# Patient Record
Sex: Female | Born: 1945
Health system: Southern US, Community
[De-identification: ages and names within clinical notes are randomized; demographics above are authoritative.]

## PROBLEM LIST (undated history)

## (undated) ENCOUNTER — Emergency Department (HOSPITAL_BASED_OUTPATIENT_CLINIC_OR_DEPARTMENT_OTHER): Admission: EM | Payer: Medicare Other | Source: Home / Self Care

## (undated) DIAGNOSIS — K589 Irritable bowel syndrome without diarrhea: Secondary | ICD-10-CM

## (undated) DIAGNOSIS — J309 Allergic rhinitis, unspecified: Secondary | ICD-10-CM

## (undated) DIAGNOSIS — K219 Gastro-esophageal reflux disease without esophagitis: Secondary | ICD-10-CM

## (undated) DIAGNOSIS — E785 Hyperlipidemia, unspecified: Secondary | ICD-10-CM

## (undated) DIAGNOSIS — F32A Depression, unspecified: Secondary | ICD-10-CM

## (undated) DIAGNOSIS — D126 Benign neoplasm of colon, unspecified: Secondary | ICD-10-CM

## (undated) DIAGNOSIS — H269 Unspecified cataract: Secondary | ICD-10-CM

## (undated) DIAGNOSIS — I10 Essential (primary) hypertension: Secondary | ICD-10-CM

## (undated) DIAGNOSIS — N289 Disorder of kidney and ureter, unspecified: Secondary | ICD-10-CM

## (undated) DIAGNOSIS — K760 Fatty (change of) liver, not elsewhere classified: Secondary | ICD-10-CM

## (undated) DIAGNOSIS — I8390 Asymptomatic varicose veins of unspecified lower extremity: Secondary | ICD-10-CM

## (undated) DIAGNOSIS — M1990 Unspecified osteoarthritis, unspecified site: Secondary | ICD-10-CM

## (undated) DIAGNOSIS — F419 Anxiety disorder, unspecified: Secondary | ICD-10-CM

## (undated) DIAGNOSIS — R51 Headache: Secondary | ICD-10-CM

## (undated) DIAGNOSIS — K648 Other hemorrhoids: Secondary | ICD-10-CM

## (undated) DIAGNOSIS — R042 Hemoptysis: Secondary | ICD-10-CM

## (undated) DIAGNOSIS — T7840XA Allergy, unspecified, initial encounter: Secondary | ICD-10-CM

## (undated) DIAGNOSIS — E559 Vitamin D deficiency, unspecified: Secondary | ICD-10-CM

## (undated) HISTORY — DX: Unspecified osteoarthritis, unspecified site: M19.90

## (undated) HISTORY — DX: Allergy, unspecified, initial encounter: T78.40XA

## (undated) HISTORY — PX: BREAST CYST ASPIRATION: SHX578

## (undated) HISTORY — DX: Hemoptysis: R04.2

## (undated) HISTORY — DX: Irritable bowel syndrome without diarrhea: K58.9

## (undated) HISTORY — DX: Essential (primary) hypertension: I10

## (undated) HISTORY — PX: ROTATOR CUFF REPAIR: SHX139

## (undated) HISTORY — DX: Gastro-esophageal reflux disease without esophagitis: K21.9

## (undated) HISTORY — DX: Allergic rhinitis, unspecified: J30.9

## (undated) HISTORY — PX: BREAST SURGERY: SHX581

## (undated) HISTORY — DX: Vitamin D deficiency, unspecified: E55.9

## (undated) HISTORY — PX: OTHER SURGICAL HISTORY: SHX169

## (undated) HISTORY — PX: COLONOSCOPY: SHX174

## (undated) HISTORY — DX: Fatty (change of) liver, not elsewhere classified: K76.0

## (undated) HISTORY — DX: Benign neoplasm of colon, unspecified: D12.6

## (undated) HISTORY — DX: Depression, unspecified: F32.A

## (undated) HISTORY — DX: Disorder of kidney and ureter, unspecified: N28.9

## (undated) HISTORY — DX: Headache: R51

## (undated) HISTORY — DX: Anxiety disorder, unspecified: F41.9

## (undated) HISTORY — DX: Unspecified cataract: H26.9

## (undated) HISTORY — DX: Asymptomatic varicose veins of unspecified lower extremity: I83.90

## (undated) HISTORY — DX: Hyperlipidemia, unspecified: E78.5

## (undated) HISTORY — DX: Other hemorrhoids: K64.8

---

## 1998-12-23 ENCOUNTER — Ambulatory Visit (HOSPITAL_COMMUNITY): Admission: RE | Admit: 1998-12-23 | Discharge: 1998-12-23 | Payer: Self-pay | Admitting: Surgery

## 1998-12-23 ENCOUNTER — Encounter: Payer: Self-pay | Admitting: Surgery

## 1999-12-25 ENCOUNTER — Encounter: Payer: Self-pay | Admitting: Surgery

## 1999-12-25 ENCOUNTER — Ambulatory Visit (HOSPITAL_COMMUNITY): Admission: RE | Admit: 1999-12-25 | Discharge: 1999-12-25 | Payer: Self-pay | Admitting: Surgery

## 2000-11-01 ENCOUNTER — Other Ambulatory Visit: Admission: RE | Admit: 2000-11-01 | Discharge: 2000-11-01 | Payer: Self-pay | Admitting: *Deleted

## 2000-11-24 ENCOUNTER — Encounter: Admission: RE | Admit: 2000-11-24 | Discharge: 2000-11-24 | Payer: Self-pay | Admitting: *Deleted

## 2000-11-24 ENCOUNTER — Ambulatory Visit (HOSPITAL_COMMUNITY): Admission: RE | Admit: 2000-11-24 | Discharge: 2000-11-24 | Payer: Self-pay | Admitting: *Deleted

## 2000-11-24 ENCOUNTER — Encounter: Payer: Self-pay | Admitting: *Deleted

## 2002-04-13 ENCOUNTER — Encounter: Payer: Self-pay | Admitting: Internal Medicine

## 2002-04-13 ENCOUNTER — Ambulatory Visit (HOSPITAL_COMMUNITY): Admission: RE | Admit: 2002-04-13 | Discharge: 2002-04-13 | Payer: Self-pay | Admitting: Internal Medicine

## 2003-05-08 ENCOUNTER — Encounter: Payer: Self-pay | Admitting: Internal Medicine

## 2003-05-08 ENCOUNTER — Ambulatory Visit (HOSPITAL_COMMUNITY): Admission: RE | Admit: 2003-05-08 | Discharge: 2003-05-08 | Payer: Self-pay | Admitting: Internal Medicine

## 2004-05-28 ENCOUNTER — Ambulatory Visit (HOSPITAL_COMMUNITY): Admission: RE | Admit: 2004-05-28 | Discharge: 2004-05-28 | Payer: Self-pay | Admitting: Internal Medicine

## 2005-06-24 ENCOUNTER — Emergency Department (HOSPITAL_COMMUNITY): Admission: EM | Admit: 2005-06-24 | Discharge: 2005-06-24 | Payer: Self-pay | Admitting: Emergency Medicine

## 2005-07-13 ENCOUNTER — Ambulatory Visit (HOSPITAL_COMMUNITY): Admission: RE | Admit: 2005-07-13 | Discharge: 2005-07-13 | Payer: Self-pay | Admitting: Internal Medicine

## 2006-07-14 ENCOUNTER — Ambulatory Visit (HOSPITAL_COMMUNITY): Admission: RE | Admit: 2006-07-14 | Discharge: 2006-07-14 | Payer: Self-pay | Admitting: Internal Medicine

## 2007-04-24 ENCOUNTER — Ambulatory Visit: Payer: Self-pay

## 2007-06-26 ENCOUNTER — Ambulatory Visit (HOSPITAL_COMMUNITY): Admission: RE | Admit: 2007-06-26 | Discharge: 2007-06-26 | Payer: Self-pay | Admitting: Internal Medicine

## 2007-07-18 ENCOUNTER — Ambulatory Visit (HOSPITAL_COMMUNITY): Admission: RE | Admit: 2007-07-18 | Discharge: 2007-07-18 | Payer: Self-pay | Admitting: Internal Medicine

## 2007-11-08 ENCOUNTER — Ambulatory Visit (HOSPITAL_COMMUNITY): Admission: RE | Admit: 2007-11-08 | Discharge: 2007-11-08 | Payer: Self-pay | Admitting: Internal Medicine

## 2008-02-07 ENCOUNTER — Ambulatory Visit: Payer: Self-pay | Admitting: Vascular Surgery

## 2008-04-12 ENCOUNTER — Ambulatory Visit: Payer: Self-pay | Admitting: Vascular Surgery

## 2008-05-22 ENCOUNTER — Ambulatory Visit: Payer: Self-pay | Admitting: Vascular Surgery

## 2008-05-29 ENCOUNTER — Ambulatory Visit: Payer: Self-pay | Admitting: Vascular Surgery

## 2008-07-12 ENCOUNTER — Ambulatory Visit: Payer: Self-pay | Admitting: Vascular Surgery

## 2008-07-18 ENCOUNTER — Ambulatory Visit (HOSPITAL_COMMUNITY): Admission: RE | Admit: 2008-07-18 | Discharge: 2008-07-18 | Payer: Self-pay | Admitting: Internal Medicine

## 2008-09-06 LAB — CONVERTED CEMR LAB: Pap Smear: NORMAL

## 2008-10-23 ENCOUNTER — Encounter (INDEPENDENT_AMBULATORY_CARE_PROVIDER_SITE_OTHER): Payer: Self-pay | Admitting: *Deleted

## 2009-07-21 ENCOUNTER — Ambulatory Visit (HOSPITAL_COMMUNITY): Admission: RE | Admit: 2009-07-21 | Discharge: 2009-07-21 | Payer: Self-pay | Admitting: Obstetrics & Gynecology

## 2009-09-17 ENCOUNTER — Telehealth: Payer: Self-pay | Admitting: Internal Medicine

## 2009-09-18 ENCOUNTER — Encounter (INDEPENDENT_AMBULATORY_CARE_PROVIDER_SITE_OTHER): Payer: Self-pay | Admitting: *Deleted

## 2009-09-25 ENCOUNTER — Ambulatory Visit: Payer: Self-pay | Admitting: Internal Medicine

## 2009-09-25 ENCOUNTER — Encounter (INDEPENDENT_AMBULATORY_CARE_PROVIDER_SITE_OTHER): Payer: Self-pay | Admitting: *Deleted

## 2009-10-07 LAB — HM COLONOSCOPY

## 2009-10-14 ENCOUNTER — Ambulatory Visit: Payer: Self-pay | Admitting: Internal Medicine

## 2010-06-30 ENCOUNTER — Ambulatory Visit: Payer: Self-pay | Admitting: Internal Medicine

## 2010-06-30 DIAGNOSIS — I10 Essential (primary) hypertension: Secondary | ICD-10-CM

## 2010-06-30 DIAGNOSIS — E559 Vitamin D deficiency, unspecified: Secondary | ICD-10-CM

## 2010-06-30 DIAGNOSIS — E785 Hyperlipidemia, unspecified: Secondary | ICD-10-CM

## 2010-06-30 DIAGNOSIS — K219 Gastro-esophageal reflux disease without esophagitis: Secondary | ICD-10-CM

## 2010-06-30 DIAGNOSIS — J309 Allergic rhinitis, unspecified: Secondary | ICD-10-CM

## 2010-06-30 DIAGNOSIS — K589 Irritable bowel syndrome without diarrhea: Secondary | ICD-10-CM

## 2010-06-30 DIAGNOSIS — R51 Headache: Secondary | ICD-10-CM

## 2010-06-30 HISTORY — DX: Vitamin D deficiency, unspecified: E55.9

## 2010-06-30 HISTORY — DX: Hyperlipidemia, unspecified: E78.5

## 2010-06-30 HISTORY — DX: Headache: R51

## 2010-06-30 HISTORY — DX: Gastro-esophageal reflux disease without esophagitis: K21.9

## 2010-06-30 HISTORY — DX: Irritable bowel syndrome, unspecified: K58.9

## 2010-06-30 HISTORY — DX: Allergic rhinitis, unspecified: J30.9

## 2010-06-30 HISTORY — DX: Essential (primary) hypertension: I10

## 2010-07-22 ENCOUNTER — Ambulatory Visit (HOSPITAL_COMMUNITY): Admission: RE | Admit: 2010-07-22 | Discharge: 2010-07-22 | Payer: Self-pay | Admitting: Internal Medicine

## 2010-07-22 LAB — HM MAMMOGRAPHY

## 2010-07-27 ENCOUNTER — Telehealth: Payer: Self-pay | Admitting: *Deleted

## 2010-07-27 ENCOUNTER — Ambulatory Visit: Payer: Self-pay | Admitting: Internal Medicine

## 2010-07-27 LAB — CONVERTED CEMR LAB
ALT: 27 units/L (ref 0–35)
AST: 32 units/L (ref 0–37)
Albumin: 4 g/dL (ref 3.5–5.2)
Alkaline Phosphatase: 47 units/L (ref 39–117)
BUN: 12 mg/dL (ref 6–23)
Basophils Absolute: 0.1 10*3/uL (ref 0.0–0.1)
Basophils Relative: 0.9 % (ref 0.0–3.0)
Bilirubin, Direct: 0.1 mg/dL (ref 0.0–0.3)
CO2: 30 meq/L (ref 19–32)
Calcium: 9.8 mg/dL (ref 8.4–10.5)
Chloride: 105 meq/L (ref 96–112)
Cholesterol: 153 mg/dL (ref 0–200)
Creatinine, Ser: 1 mg/dL (ref 0.4–1.2)
Eosinophils Absolute: 0.3 10*3/uL (ref 0.0–0.7)
Eosinophils Relative: 4.3 % (ref 0.0–5.0)
Free T4: 1.07 ng/dL (ref 0.60–1.60)
GFR calc non Af Amer: 62.36 mL/min (ref >60)
Glucose, Bld: 91 mg/dL (ref 70–99)
HCT: 36.9 % (ref 36.0–46.0)
HDL: 56.7 mg/dL (ref >39.00)
Hemoglobin: 12.6 g/dL (ref 12.0–15.0)
IgA: 331 mg/dL (ref 68–378)
LDL Cholesterol: 84 mg/dL (ref 0–99)
Lymphocytes Relative: 40.7 % (ref 12.0–46.0)
Lymphs Abs: 2.8 10*3/uL (ref 0.7–4.0)
MCHC: 34.1 g/dL (ref 30.0–36.0)
MCV: 86.1 fL (ref 78.0–100.0)
Magnesium: 2.2 mg/dL (ref 1.5–2.5)
Monocytes Absolute: 0.5 10*3/uL (ref 0.1–1.0)
Monocytes Relative: 6.7 % (ref 3.0–12.0)
Neutro Abs: 3.3 10*3/uL (ref 1.4–7.7)
Neutrophils Relative %: 47.4 % (ref 43.0–77.0)
Platelets: 264 10*3/uL (ref 150.0–400.0)
Potassium: 4.6 meq/L (ref 3.5–5.1)
RBC: 4.29 M/uL (ref 3.87–5.11)
RDW: 12.2 % (ref 11.5–14.6)
Sed Rate: 11 mm/hr (ref 0–22)
Sodium: 142 meq/L (ref 135–145)
TSH: 1.69 microintl units/mL (ref 0.35–5.50)
Tissue Transglutaminase Ab, IgA: 11.5 units (ref <20)
Total Bilirubin: 0.5 mg/dL (ref 0.3–1.2)
Total CHOL/HDL Ratio: 3
Total Protein: 6.7 g/dL (ref 6.0–8.3)
Triglycerides: 64 mg/dL (ref 0.0–149.0)
VLDL: 12.8 mg/dL (ref 0.0–40.0)
Vit D, 25-Hydroxy: 48 ng/mL (ref 30–89)
WBC: 6.9 10*3/uL (ref 4.5–10.5)

## 2010-08-03 ENCOUNTER — Encounter: Payer: Self-pay | Admitting: Internal Medicine

## 2010-08-03 ENCOUNTER — Ambulatory Visit: Payer: Self-pay | Admitting: Internal Medicine

## 2010-08-03 ENCOUNTER — Other Ambulatory Visit
Admission: RE | Admit: 2010-08-03 | Discharge: 2010-08-03 | Payer: Self-pay | Source: Home / Self Care | Admitting: Internal Medicine

## 2010-08-03 LAB — CONVERTED CEMR LAB
Bilirubin Urine: NEGATIVE
Blood in Urine, dipstick: NEGATIVE
Glucose, Urine, Semiquant: NEGATIVE
Ketones, urine, test strip: NEGATIVE
Nitrite: NEGATIVE
Protein, U semiquant: NEGATIVE
Specific Gravity, Urine: <1.005
Urobilinogen, UA: 0.2
WBC Urine, dipstick: NEGATIVE
pH: 5

## 2010-08-03 LAB — HM PAP SMEAR

## 2010-08-06 ENCOUNTER — Telehealth: Payer: Self-pay | Admitting: Internal Medicine

## 2010-08-10 LAB — CONVERTED CEMR LAB: Pap Smear: NEGATIVE

## 2010-09-22 ENCOUNTER — Encounter: Payer: Self-pay | Admitting: Internal Medicine

## 2010-10-06 NOTE — Procedures (Signed)
Summary: Colonoscopy  Patient: Michele Monroe Note: All result statuses are Final unless otherwise noted.  Tests: (1) Colonoscopy (COL)   COL Colonoscopy           DONE     [facility]     [address]     [place], [place]  [address]           COLONOSCOPY PROCEDURE REPORT           PATIENT:  Michele Monroe  MR#:  [id number]     BIRTHDATE:  [birth year], 63 yrs. old  GENDER:  female           ENDOSCOPIST:  Hedwig Morton. Juanda Chance, MD     Referred by:  Marisue Brooklyn, D.O.           PROCEDURE DATE:  10/14/2009     PROCEDURE:  Colonoscopy 19147     ASA CLASS:  Class I     INDICATIONS:  family history of colon cancer colon cancer in     mother age 60     last colon 2005           MEDICATIONS:   Versed 9 mg, Fentanyl 75 mcg           DESCRIPTION OF PROCEDURE:   After the risks benefits and     alternatives of the procedure were thoroughly explained, informed     consent was obtained.  Digital rectal exam was performed and     revealed no rectal masses.   The LB CF-[id number] endoscope     was introduced through the anus and advanced to the cecum, which     was identified by both the appendix and ileocecal valve, without     limitations.  The quality of the prep was good, using MiraLax.     The instrument was then slowly withdrawn as the colon was fully     examined.     <<PROCEDUREIMAGES>>           FINDINGS:  Internal hemorrhoids were found (see image6, image5,     and image7).  This was otherwise a normal examination of the colon     (see image4, image3, image2, and image1).   Retroflexed views in     the rectum revealed no abnormalities.    The scope was then     withdrawn from the patient and the procedure completed.           COMPLICATIONS:  None           ENDOSCOPIC IMPRESSION:     1) Internal hemorrhoids     2) Otherwise normal examination     RECOMMENDATIONS:     1) high fiber diet           REPEAT EXAM:  In 5 year(s) for.            ______________________________     Hedwig Morton. Juanda Chance, MD           CC:           n.     eSIGNED:   Hedwig Morton. Coriann Brouhard at 10/14/2009 11:06 AM           Rory Percy, [id number]  Note: An exclamation mark (!) indicates a result that was not dispersed into the flowsheet. Document Creation Date: 10/14/2009 11:06 AM _______________________________________________________________________  (1) Order result status: Final Collection or observation date-time: 10/14/2009 11:00 Requested date-time:  Receipt date-time:  Reported date-time:  Referring Physician:  Ordering Physician: [name] [phone]) Specimen Source:  Source: Launa Grill Order Number: [id number] Lab site:   Appended Document: Colonoscopy    Clinical Lists Changes  Observations: Added new observation of COLONNXTDUE: 10/2014 (10/14/2009 11:15)

## 2010-10-06 NOTE — Progress Notes (Signed)
Summary: Schedule Colonoscopy  Phone Note Outgoing Call   Call placed by: Hortense Ramal CMA Duncan Dull),  September 17, 2009 1:43 PM Call placed to: Patient Summary of Call: Left message for patient to call back to schedule recall colonoscopy (due to her family history of colon cancer). Initial call taken by: Hortense Ramal CMA Duncan Dull),  September 17, 2009 1:43 PM  Follow-up for Phone Call        Patient has been scheduled for previsit and colonoscopy. Follow-up by: Hortense Ramal CMA Duncan Dull),  September 18, 2009 2:25 PM

## 2010-10-06 NOTE — Letter (Signed)
Summary: [facility] Instructions  [facility]  [address] [place], [place] [address]   Phone: [phone]  Fax: [phone]       Michele Monroe    [birth year]    MRN: [id number]        Procedure Day /Date: 10/14/09   Tuesday     Arrival Time:  9:30am      Procedure Time: 10:30am     Location of Procedure:                    _ x_   Endoscopy Center (4th Floor)                        PREPARATION FOR COLONOSCOPY WITH MOVIPREP   Starting 5 days prior to your procedure _2/2/11 _ do not eat nuts, seeds, popcorn, corn, beans, peas,  salads, or any raw vegetables.  Do not take any fiber supplements (e.g. Metamucil, Citrucel, and Benefiber).  THE DAY BEFORE YOUR PROCEDURE         DATE:  10/13/09  DAY:   Monday  1.  Drink clear liquids the entire day-NO SOLID FOOD  2.  Do not drink anything colored red or purple.  Avoid juices with pulp.  No orange juice.  3.  Drink at least 64 oz. (8 glasses) of fluid/clear liquids during the day to prevent dehydration and help the prep work efficiently.  CLEAR LIQUIDS INCLUDE: Water Jello Ice Popsicles Tea (sugar ok, no milk/cream) Powdered fruit flavored drinks Coffee (sugar ok, no milk/cream) Gatorade Juice: apple, white grape, white cranberry  Lemonade Clear bullion, consomm, broth Carbonated beverages (any kind) Strained chicken noodle soup Hard Candy                             4.  In the morning, mix first dose of MoviPrep solution:    Empty 1 Pouch A and 1 Pouch B into the disposable container    Add lukewarm drinking water to the top line of the container. Mix to dissolve    Refrigerate (mixed solution should be used within 24 hrs)  5.  Begin drinking the prep at 5:00 p.m. The MoviPrep container is divided by 4 marks.   Every 15 minutes drink the solution down to the next mark (approximately 8 oz) until the full liter is complete.   6.  Follow completed prep with 16 oz of clear liquid of your choice  (Nothing red or purple).  Continue to drink clear liquids until bedtime.  7.  Before going to bed, mix second dose of MoviPrep solution:    Empty 1 Pouch A and 1 Pouch B into the disposable container    Add lukewarm drinking water to the top line of the container. Mix to dissolve    Refrigerate  THE DAY OF YOUR PROCEDURE      DATE:  10/14/09 DAY:  Tuesday  Beginning at  5:30 a.m. (5 hours before procedure):         1. Every 15 minutes, drink the solution down to the next mark (approx 8 oz) until the full liter is complete.  2. Follow completed prep with 16 oz. of clear liquid of your choice.    3. You may drink clear liquids until  8:30am  (2 HOURS BEFORE PROCEDURE).   MEDICATION INSTRUCTIONS  Unless otherwise instructed, you should take regular prescription medications with a small sip of  water   as early as possible the morning of your procedure.         OTHER INSTRUCTIONS  You will need a responsible adult at least 64 years of age to accompany you and drive you home.   This person must remain in the waiting room during your procedure.  Wear loose fitting clothing that is easily removed.  Leave jewelry and other valuables at home.  However, you may wish to bring a book to read or  an iPod/MP3 player to listen to music as you wait for your procedure to start.  Remove all body piercing jewelry and leave at home.  Total time from sign-in until discharge is approximately 2-3 hours.  You should go home directly after your procedure and rest.  You can resume normal activities the  day after your procedure.  The day of your procedure you should not:   Drive   Make legal decisions   Operate machinery   Drink alcohol   Return to work  You will receive specific instructions about eating, activities and medications before you leave.    The above instructions have been reviewed and explained to me by   Clide Cliff, RN______________________    I fully  understand and can verbalize these instructions _____________________________ Date _________

## 2010-10-06 NOTE — Miscellaneous (Signed)
Summary: REC COL...AS.  Clinical Lists Changes  Medications: Added new medication of MOVIPREP 100 GM  SOLR (PEG-KCL-NACL-NASULF-NA ASC-C) As directed - Signed Rx of MOVIPREP 100 GM  SOLR (PEG-KCL-NACL-NASULF-NA ASC-C) As directed;  #1 x 0;  Signed;  Entered by: Clide Cliff RN;  Authorized by: Hart Carwin MD;  Method used: Electronically to [id number] [address], [place], [place]  [address], Ph: [phone] or [phone], Fax: [phone] Observations: Added new observation of NKA: T (09/25/2009 10:45)    Prescriptions: MOVIPREP 100 GM  SOLR (PEG-KCL-NACL-NASULF-NA ASC-C) As directed  #1 x 0   Entered by:   Clide Cliff RN   Authorized by:   Hart Carwin MD   Signed by:   Clide Cliff RN on 09/25/2009   Method used:   Electronically to        [facility] (retail)       [address]       [place], [place]  [address]       Ph: [phone] or [phone]       Fax: [phone]   RxID:   [id number]

## 2010-10-06 NOTE — Letter (Signed)
Summary: Previsit letter  [facility] [place] [facility]  [address] [place], [place] [address]   Phone: [phone]  Fax: [phone]       09/18/2009 MRN: [id number]  Michele Monroe [address] [place], [place]  [address]  Dear Ms. Crickenberger,  Welcome to the Gastroenterology Division at [facility].    You are scheduled to see a nurse for your pre-procedure visit on 09/25/2009 at 8:30AM on the 3rd floor at [facility], [address]  We ask that you try to arrive at our office 15 minutes prior to your appointment time to allow for check-in.  Your nurse visit will consist of discussing your medical and surgical history, your immediate family medical history, and your medications.    Please bring a complete list of all your medications or, if you prefer, bring the medication bottles and we will list them.  We will need to be aware of both prescribed and over the counter drugs.  We will need to know exact dosage information as well.  If you are on blood thinners (Coumadin, Plavix, Aggrenox, Ticlid, etc.) please call our office today/prior to your appointment, as we need to consult with your physician about holding your medication.   Please be prepared to read and sign documents such as consent forms, a financial agreement, and acknowledgement forms.  If necessary, and with your consent, a friend or relative is welcome to sit-in on the nurse visit with you.  Please bring your insurance card so that we may make a copy of it.  If your insurance requires a referral to see a specialist, please bring your referral form from your primary care physician.  No co-pay is required for this nurse visit.     If you cannot keep your appointment, please call [phone] to cancel or reschedule prior to your appointment date.  This allows [place] the opportunity to schedule an appointment for another patient in need of care.    Thank you for choosing [facility] for your medical needs.  We  appreciate the opportunity to care for you.  Please visit [place] at our website  to learn more about our practice.                     Sincerely.                                                                                                                   The Gastroenterology Division

## 2010-10-06 NOTE — Assessment & Plan Note (Signed)
Summary: to be est/njr   Vital Signs:  Patient profile:   64 year old female Menstrual status:  postmenopausal Height:      65 inches Weight:      179 pounds BMI:     29.89 Pulse rate:   66 / minute BP sitting:   110 / 70  (right arm) Cuff size:   regular  Vitals Entered By: Romualdo Bolk, CMA (AAMA) (June 30, 2010 1:21 PM)  Nutrition Counseling: Patient's BMI is greater than 25 and therefore counseled on weight management options. CC: New Pt to get establish- Pt wants to discuss 1) fatigue, 2) constipation, 3) diet, 4) get labs checked, 5) Wants to eat constantly 6) Wants chocolate all the time 7) low calcium level in the past 8)Gas all the time     Menstrual Status postmenopausal Last PAP Result normal   History of Present Illness: Michele Monroe comes in today  for  new patient visit.  She has multiple concerns.  and problems.   See History form.  Her Previous pcp   check and labs  was over a year ago.      HT  : for 4 year  and ok   up until   husband died  .     On atenolol  Hx of lipid elevated but no on meds.  OA   Surgery on right  shoulder    RC problem LIPIDS:  On  zetia and feno fibrate  . NO records available .  No known se  No hx of   elevation of  Allergic rhinitis on xyrtec . no asthma HA s    has been  to HA clinic  and has had HAs   for a long time  and  still  .     15-20  days  of month    she may have to take a medication   .    doesnt have tramadol.  weight know she needs to lose  craves chocolat  and food "constantly"   Preventive Care Screening  Mammogram:    Date:  07/21/2009    Results:  normal   Pap Smear:    Date:  09/06/2008    Results:  normal   Last Tetanus Booster:    Date:  09/07/2007    Results:  Historical    Preventive Screening-Counseling & Management  Alcohol-Tobacco     Alcohol drinks/day: <1     Alcohol type: wine     Smoking Status: quit     Year Quit: in 20's  Caffeine-Diet-Exercise     Caffeine use/day: 4-5   Does Patient Exercise: no  Hep-HIV-STD-Contraception     Sun Exposure-Excessive: no  Safety-Violence-Falls     Seat Belt Use: yes     Firearms in the Home: no firearms in the home     Smoke Detectors: yes      Drug Use:  no.        Blood Transfusions:  no.    Current Medications (verified): 1)  Zetia 10 Mg Tabs (Ezetimibe) .Marland Kitchen.. 1 By Mouth Once Daily 2)  Fenofibrate Micronized 134 Mg Caps (Fenofibrate Micronized) .Marland Kitchen.. 1 By Mouth Once Daily 3)  Atenolol 25 Mg Tabs (Atenolol) .Marland Kitchen.. 1 By Mouth Once Daily 4)  Nexium 40 Mg Cpdr (Esomeprazole Magnesium) .Marland Kitchen.. 1 By Mouth Once Daily 5)  Vitamin D3 50000 Unit Caps (Cholecalciferol) .Marland Kitchen.. 1 By Mouth Once Daily 6)  Cetirizine Hcl 10 Mg Tabs (Cetirizine Hcl) .Marland KitchenMarland KitchenMarland Kitchen  1 By Mouth Once Daily 7)  Maxalt 10 Mg Tabs (Rizatriptan Benzoate) .Marland Kitchen.. 1 By Mouth Once Daily As Needed 8)  Tramadol Hcl 50 Mg Tabs (Tramadol Hcl) .Marland Kitchen.. 1 By Mouth Once Daily As Needed For Pain  Allergies (verified): No Known Drug Allergies  Past History:  Past Medical History: G1 GERD on nexium  Hyperlipidemia Hypertension Migraines  chronic  PErennial  allergic rhinitis   Depressive reaction  Past Surgical History: Rt Rotator Cuff surgery Needle Bx on Left Breast Surgery on feet Varicose veins.   Past History:  Care Management: Gastroenterology: Sindy Messing : Eulah Pont  Family History: Father: Unsure Mother: Colon Ca age 70   Siblings: Brother- Arthritis, devated septum   Sister   hasnt seen since 1974    Social History: Widow/Widower since   4 years ago   ptsd.   in his sleep.  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  The  patient denies anorexia, fever, weight loss, vision loss, chest pain, syncope, dyspnea on exertion, prolonged cough, hemoptysis, melena, hematochezia, severe indigestion/heartburn, hematuria, transient blindness, difficulty walking, abnormal bleeding, enlarged lymph nodes, and angioedema.         tinnitus , "gas all tthe time" constipation , fatigue " all the time"  craves chocolate.   Hx of joint pains "arthritis"  Physical Exam  General:  Well-developed,well-nourished,in no acute distress; alert,appropriate and cooperative throughout examination Head:  normocephalic and atraumatic.   Eyes:  PERRL, EOMs full, conjunctiva clear  Ears:  R ear normal, L ear normal, and no external deformities.   Mouth:  pharynx pink and moist.   Neck:  No deformities, masses, or tenderness noted. Lungs:  Normal respiratory effort, chest expands symmetrically. Lungs are clear to auscultation, no crackles or wheezes. Heart:  Normal rate and regular rhythm. S1 and S2 normal without gallop, murmur, click, rub or other extra sounds. Abdomen:  Bowel sounds positive,abdomen soft and non-tender without masses, organomegaly or   noted. Msk:  no joint swelling, no joint warmth, and no redness over joints.   Pulses:  pulses intact without delay   Extremities:  no clubbing cyanosis or edema  Neurologic:  Pt is A&Ox3,affect,speech,memory,attention,&motor skills appear intact.  non f ocal  Skin:  turgor normal, color normal, no ecchymoses, and no petechiae.   Cervical Nodes:  No lymphadenopathy noted Psych:  Oriented X3, memory intact for recent and remote, normally interactive, good eye contact, not anxious appearing, and not depressed appearing.     Impression & Recommendations:  Problem # 1:  HEADACHE (ICD-784.0) concern that she could be gettin rebound has and is on chronic meds but not a suppressive med except her antihypertensive . reviewed this with her  Her updated medication list for this problem includes:     Atenolol 25 Mg Tabs (Atenolol) .Marland Kitchen... 1 by mouth once daily    Maxalt 10 Mg Tabs (Rizatriptan benzoate) .Marland Kitchen... 1 by mouth once daily as needed    Tramadol Hcl 50 Mg Tabs (Tramadol hcl) .Marland Kitchen... 1 by mouth once daily as needed for pain  Problem # 2:  HYPERTENSION (  ICD-401.9) controlled  Her updated medication list for this problem includes:    Atenolol 25 Mg Tabs (Atenolol) .Marland Kitchen... 1 by mouth once daily  BP today: 110/70  Problem # 3:  HYPERLIPIDEMIA (ICD-272.4) due for monitoring  .unsure why this combo and not a statin Her updated medication list for this problem includes:    Zetia 10 Mg Tabs (Ezetimibe) .Marland Kitchen... 1 by mouth once daily    Fenofibrate Micronized 134 Mg Caps (Fenofibrate micronized) .Marland Kitchen... 1 by mouth once daily  Problem # 4:  GERD (ICD-530.81)  Her updated medication list for this problem includes:    Nexium 40 Mg Cpdr (Esomeprazole magnesium) .Marland Kitchen... 1 by mouth once daily  Problem # 5:  IBS (ICD-564.1) Assessment: Comment Only with constipation and gas  .  check celiac panel with labs.   Problem # 6:  VITAMIN D DEFICIENCY (ICD-268.9) Assessment: Comment Only  Problem # 7:  weight gain  counseled lifestyle intervention   Problem # 8:  ALLERGIC RHINITIS (ICD-477.9)  Her updated medication list for this problem includes:    Cetirizine Hcl 10 Mg Tabs (Cetirizine hcl) .Marland Kitchen... 1 by mouth once daily  Complete Medication List: 1)  Zetia 10 Mg Tabs (Ezetimibe) .Marland Kitchen.. 1 by mouth once daily 2)  Fenofibrate Micronized 134 Mg Caps (Fenofibrate micronized) .Marland Kitchen.. 1 by mouth once daily 3)  Atenolol 25 Mg Tabs (Atenolol) .Marland Kitchen.. 1 by mouth once daily 4)  Nexium 40 Mg Cpdr (Esomeprazole magnesium) .Marland Kitchen.. 1 by mouth once daily 5)  Vitamin D3 50000 Unit Caps (Cholecalciferol) .Marland Kitchen.. 1 by mouth once daily 6)  Cetirizine Hcl 10 Mg Tabs (Cetirizine hcl) .Marland Kitchen.. 1 by mouth once daily 7)  Maxalt 10 Mg Tabs (Rizatriptan benzoate) .Marland Kitchen.. 1 by mouth once daily as needed 8)  Tramadol Hcl 50 Mg Tabs (Tramadol hcl)  .Marland Kitchen.. 1 by mouth once daily as needed for pain  Patient Instructions: 1)  track HAs  and then   review at follow up . 2)  limit   tramadol   but will follow .  limit  caffiene as a heada che trigger .  3)  Plan fasting lipids.  bmp ,cbcdiff,   tsh  , Mg.    free T4  lft s,   esr .   celiac panel , vitamin D  level.   dx v70 and vit d deficiency  4)  Get copy of  last  2 years of check ups  any labs    from you previous physican.  5)  CPX  in 1 month  and review above at that time .    ( not on a thursday or friday pm)  Prescriptions: NEXIUM 40 MG CPDR (ESOMEPRAZOLE MAGNESIUM) 1 by mouth once daily  #30 x 3   Entered and Authorized by:   [name] MD   Signed by:   [name] MD on 06/30/2010   Method used:   [facility]ronically to        [facility]       [address]       [place], [place]  [address]       Ph: [phone] or [phone]       Fax: [phone]   RxID:   [id number]    Orders Added: 1)  New Patient Level IV [phone]

## 2010-10-06 NOTE — Progress Notes (Signed)
Summary: REQUEST FOR REFILL  Phone Note Refill Request Call back at Home Phone [phone]   Refills Requested: Medication #1:  ZETIA 10 MG TABS 1 by mouth once daily  Method Requested: Telephone to Pharmacy Initial call taken by: Roney Jaffe,  July 27, 2010 8:14 AM  Follow-up for Phone Call        Rx sent to pharmacy. Follow-up by: Romualdo Bolk, CMA Duncan Dull),  July 27, 2010 8:50 AM    Prescriptions: ZETIA 10 MG TABS (EZETIMIBE) 1 by mouth once daily  #30 x 0   Entered by:   Romualdo Bolk, CMA (AAMA)   Authorized by:   [name] MD   Signed by:   Romualdo Bolk, CMA (AAMA) on 07/27/2010   Method used:   Electronically to        [facility]       [address]       [place], [place]  [address]       Ph: [phone] or [phone]       Fax: [phone]   RxID:   [id number]

## 2010-10-06 NOTE — Progress Notes (Signed)
Summary: needs antibiotic  Phone Note Call from Patient   Caller: Patient Call For: [name] MD Summary of Call: [facility] [phone] Pt is having symptoms of nose running, sore throat, and green nasal drainage. No fever,  Wants to catch it in time before she has a full blown sinus infection.  Needs antbiotics, she states.  Initial call taken by: [facility] CMA AAMA,  August 06, 2010 12:35 PM  Follow-up for Phone Call        antibiotics usually not helpful   and may e harmful before 7-10 days of signs .   saline washes decongestatnt and nasal cortisones if poss allergic also  if she has a fever   then needs ov otherweis   ov if not better after a week. Follow-up by: [name] MD,  August 07, 2010 8:23 AM  Additional Follow-up for Phone Call Additional follow up Details #1::        Message left on pt personally identified VM Additional Follow-up by: Sid Falcon LPN,  August 07, 2010 11:04 AM

## 2010-10-06 NOTE — Assessment & Plan Note (Signed)
Summary: cpx--ok per dr Laquita Harlan//ccm   Vital Signs:  Patient profile:   65 year old female Menstrual status:  postmenopausal Height:      65.25 inches Weight:      177 pounds Pulse rate:   78 / minute BP sitting:   120 / 70  (right arm) Cuff size:   regular  Vitals Entered By: Romualdo Bolk, CMA (AAMA) (August 03, 2010 1:27 PM)  Serial Vital Signs/Assessments:  Time      Position  BP       Pulse  Resp  Temp     By 1:32 PM             122/71                         Romualdo Bolk, CMA (AAMA)  Comments: 1:32 PM Pt's machine By: Romualdo Bolk, CMA (AAMA)   CC: CPX without pap   History of Present Illness: Michele Monroe comes in today  for preventive visit .  has recurrent sinsutis .  in the remote past has seen ent . NO response to  rx nose sprays.  seen by urgent care  recently and placed on antibiotic for sinusitis  getting better but   has some drainage still.   HAs:  uses about 5 maxalt per month and works quite well.   Needs refill of meds  HT seems nl as for a while and asks to be able to dc this.  feels was used during stress time and may not be needed currently .  Getting spotting in underwear at times ? if from hemorrhoids  no pain  and no known vag bleeding .  Is utd on colonscopy.  GERD NO change  LIPIDS: no se of meds  Needs refill for tramadol .   Preventive Care Screening  Prior Values:    Pap Smear:  normal (09/06/2008)    Mammogram:  ASSESSMENT: Negative - BI-RADS 1^MM DIGITAL SCREENING (07/22/2010)    Colonoscopy:  DONE (10/14/2009)    Last Tetanus Booster:  Historical (09/07/2007)   Preventive Screening-Counseling & Management  Alcohol-Tobacco     Alcohol drinks/day: <1     Alcohol type: wine     Smoking Status: quit     Year Quit: in 20's  Caffeine-Diet-Exercise     Caffeine use/day: 4-5     Does Patient Exercise: no  Hep-HIV-STD-Contraception     Sun Exposure-Excessive: no  Safety-Violence-Falls     Seat Belt Use: yes   Firearms in the Home: no firearms in the home     Smoke Detectors: yes  Current Medications (verified): 1)  Zetia 10 Mg Tabs (Ezetimibe) .Marland Kitchen.. 1 By Mouth Once Daily 2)  Fenofibrate Micronized 134 Mg Caps (Fenofibrate Micronized) .Marland Kitchen.. 1 By Mouth Once Daily 3)  Atenolol 25 Mg Tabs (Atenolol) .Marland Kitchen.. 1 By Mouth Once Daily 4)  Nexium 40 Mg Cpdr (Esomeprazole Magnesium) .Marland Kitchen.. 1 By Mouth Once Daily 5)  Vitamin D3 50000 Unit Caps (Cholecalciferol) .Marland Kitchen.. 1 By Mouth Once Daily 6)  Cetirizine Hcl 10 Mg Tabs (Cetirizine Hcl) .Marland Kitchen.. 1 By Mouth Once Daily 7)  Maxalt 10 Mg Tabs (Rizatriptan Benzoate) .Marland Kitchen.. 1 By Mouth Once Daily As Needed 8)  Tramadol Hcl 50 Mg Tabs (Tramadol Hcl) .Marland Kitchen.. 1 By Mouth Once Daily As Needed For Pain  Allergies (verified): No Known Drug Allergies  Past History:  Past medical, surgical, family and social histories (including risk factors) reviewed, and  no changes noted (except as noted below).  Past Medical History: Reviewed history from 06/30/2010 and no changes required. G1 GERD on nexium  Hyperlipidemia Hypertension Migraines  chronic  PErennial  allergic rhinitis   Depressive reaction  Past Surgical History: Reviewed history from 06/30/2010 and no changes required. Rt Rotator Cuff surgery Needle Bx on Left Breast Surgery on feet Varicose veins.   Past History:  Care Management: Gastroenterology: Sindy Messing Eulah Pont  Family History: Reviewed history from 06/30/2010 and no changes required. Father: Unsure Mother: Colon Ca age 46   Siblings: Brother- Arthritis, devated septum   Sister   hasnt seen since 32    Social History: Reviewed history from 06/30/2010 and no changes required. Widow/Widower since   4 years ago   ptsd.   in his sleep.  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    Physical Exam  General:  Well-developed,well-nourished,in no acute distress; alert,appropriate and cooperative throughout examination Head:  Normocephalic and atraumatic without obvious abnormalities. No apparent alopecia or balding. Eyes:  PERRL, EOMs full, conjunctiva clear  Ears:  R ear normal, L ear normal, and no external deformities.   Nose:  no external deformity, no external erythema, and no nasal discharge.  midlcongesetion Mouth:  good dentition and pharynx pink and moist.   Neck:  No deformities, masses, or tenderness noted. Breasts:  No mass, nodules, thickening, tenderness, bulging, retraction, inflamation, nipple discharge or skin changes noted.   Lungs:  Normal respiratory effort, chest expands symmetrically. Lungs are clear to auscultation, no crackles or wheezes.no dullness.   Heart:  Normal rate and regular rhythm. S1 and S2 normal without gallop, murmur, click, rub or other extra sounds.no lifts.   Abdomen:  Bowel sounds positive,abdomen soft and non-tender without masses, organomegaly or hernias noted. Rectal:  external hemorrhoid(s) and internal hemorrhoid(s).  smear perianal  brown blood and heme positive   Genitalia:  Pelvic Exam:        External: normal female genitalia without lesions or masses        Vagina: normal without lesions or masses        Cervix: normal without lesions or masses        Adnexa: normal bimanual exam without masses or fullness        Uterus: normal by palpation        Pap smear: performed Msk:  no joint swelling, no joint warmth, no redness over joints, and no joint deformities.   Pulses:  pulses intact without delay   Extremities:  no clubbing cyanosis or edema  Neurologic:  alert & oriented X3, cranial nerves II-XII intact, strength normal in all extremities, and gait  normal.   Skin:  turgor normal, color normal, no ecchymoses, no petechiae, and no purpura.   Cervical Nodes:  no anterior cervical adenopathy and no posterior cervical adenopathy.   Axillary Nodes:  no R axillary adenopathy and no L axillary adenopathy.   Inguinal Nodes:  no R inguinal adenopathy and no L inguinal adenopathy.   Psych:  Oriented X3, memory intact for recent and remote, normally interactive, good eye contact, not anxious appearing, and not depressed appearing.  EKG nsr ocass PAC   lab   nl celiac panel and vit d level.  Impression & Recommendations:  Problem # 1:  Preventive Health Care (ICD-V70.0) Discussed nutrition,exercise,diet,healthy weight, vitamin D and calcium.    Orders: EKG w/ Interpretation (93000) Pap Smear, Thin Prep ( Collection of) (J8119)  Problem # 2:  ROUTINE GYNECOLOGICAL EXAM (ICD-V72.31) pap done  Problem # 3:  HEADACHE (ICD-784.0) Assessment: Improved still get monthly     no obv rebound  Her updated medication list for this problem includes:    Atenolol 25 Mg Tabs (Atenolol) .Marland Kitchen... 1 by mouth once daily    Maxalt 10 Mg Tabs (Rizatriptan benzoate) .Marland Kitchen... 1 by mouth once daily as needed    Tramadol Hcl 50 Mg Tabs (Tramadol hcl) .Marland Kitchen... 1 by mouth once daily as needed for pain  Problem # 4:  HYPERTENSION (ICD-401.9)  seems to think was put on meds when husband died and was stress   related   and thinks could go off and asks about htis .  disc   poss help with HAs   but can try a slow wean  bp machine seems accurate.  Her updated medication list for this problem includes:    Atenolol 25 Mg Tabs (Atenolol) .Marland Kitchen... 1 by mouth once daily  Orders: EKG w/ Interpretation (93000)  Problem # 5:  BS (ICD-564.1)  Problem # 6:  VITAMIN D DEFICIENCY (ICD-268.9)  Problem # 7:  HYPERLIPIDEMIA (ICD-272.4)  Her updated medication list for this problem includes:    Zetia 10 Mg Tabs (Ezetimibe) .Marland Kitchen... 1 by mouth once daily    Fenofibrate Micronized 134 Mg Caps  (Fenofibrate micronized) .Marland Kitchen... 1 by mouth once daily  Orders: EKG w/ Interpretation (93000)  Labs Reviewed: SGOT: 32 (07/27/2010)   SGPT: 27 (07/27/2010)   HDL:56.70 (07/27/2010)  LDL:84 (07/27/2010)  Chol:153 (07/27/2010)  Trig:64.0 (07/27/2010)  Problem # 8:  GERD (ICD-530.81)  Her updated medication list for this problem includes:    Nexium 40 Mg Cpdr (Esomeprazole magnesium) .Marland Kitchen... 1 by mouth once daily  Problem # 9:  ? of HEMORRHOIDS, WITH BLEEDING (ICD-455.8) Assessment: New rx empirically and if  persistent or  progressive  then see GI  or other.   Complete Medication List: 1)  Zetia 10 Mg Tabs (Ezetimibe) .Marland Kitchen.. 1 by mouth once daily 2)  Fenofibrate Micronized 134 Mg Caps (Fenofibrate micronized) .Marland Kitchen.. 1 by mouth once daily 3)  Atenolol 25 Mg Tabs (Atenolol) .Marland Kitchen.. 1 by mouth once daily 4)  Nexium 40 Mg Cpdr (Esomeprazole magnesium) .Marland Kitchen.. 1 by mouth once daily 5)  Vitamin D3 50000 Unit Caps (Cholecalciferol) .Marland Kitchen.. 1 by mouth once daily 6)  Cetirizine Hcl 10 Mg Tabs (Cetirizine hcl) .Marland Kitchen.. 1 by mouth once daily 7)  Maxalt 10 Mg Tabs (Rizatriptan benzoate) .Marland Kitchen.. 1 by mouth once daily as needed 8)  Tramadol Hcl 50 Mg Tabs (Tramadol hcl) .Marland Kitchen.. 1 by mouth once daily as needed for pain 9)  Anucort-hc 25 Mg Supp (Hydrocortisone acetate) .... Use pr two times a day  for 2 weeks   Patient Instructions: 1)  can try 1/2 atenolol  25 mg   each day for a months and see if Headaches  are still ok .  If so can do every other day 1/2 pill of atenolol  before stopping.  2)  Monitor your BP readings then   call if elevated  for advice or   follow up. ( 140/90 is elevated ) 3)  .  Ok t  o decrease the vitamin d to 1000-2000 International Units per day .    4)  Use hemorrhoid rx  for 2 weeks and if still aproblem would get your Gi to see you. 5)  Check  Vitamin d level in 3 months and then ROV.  Prescriptions: TRAMADOL HCL 50 MG TABS (TRAMADOL HCL) 1 by mouth once daily as needed for pain  #30 x 2    Entered and Authorized by:   [name] MD   Signed by:   [name] MD on 08/03/2010   Method used:   Print then Give to Patient   RxID:   [id number] FENOFIBRATE MICRONIZED 134 MG CAPS (FENOFIBRATE MICRONIZED) 1 by mouth once daily  #30 x 12   Entered and Authorized by:   [name] MD   Signed by:   [name] MD on 08/03/2010   Method used:   Electronically to        [id number] (retail)       [address]       [place], [place]  [address]       Ph: [phone] or [phone]       Fax: [phone]   RxID:   [id number] ATENOLOL 25 MG TABS (ATENOLOL) 1 by mouth once daily  #30 x 06   Entered and Authorized by:   [name] MD   Signed by:   [name] MD on 08/03/2010   Method used:   Electronically to        [id number] (retail)       [address]       [place], [place]  [address]       Ph: [phone] or [phone]       Fax: [phone]   RxID:   [id number] MAXALT 10 MG TABS (RIZATRIPTAN BENZOATE) 1 by mouth once daily as needed  #18 x 4   Entered and Authorized by:   [name] MD   Signed by:   [name] MD on 08/03/2010   Method used:   Electronically to        [id number] (retail)       [address]       [place], [place]  [address]       Ph: [phone] or [phone]       Fax: [phone]   RxID:   [id number] ANUCORT-HC 25 MG SUPP (HYDROCORTISONE ACETATE) use pr two times a day  for 2 weeks  #28 x 1   Entered and Authorized by:   [name] MD   Signed by:   [name] MD on 08/03/2010   Method used:   Electronically to        [facility] (retail)       [address]       [place], [place]  [address]       Ph: [phone] or [phone]       Fax: [phone]   RxID:   [id number]    Orders Added: 1)  EKG w/ Interpretation [93000] 2)  Est. Patient 40-64 years [99396] 3)  Pap Smear, Thin Prep ( Collection of) [Q0091] 4)  Est. Patient Level III  [53976]    Laboratory Results   Urine Tests  Date/Time Received: August 03, 2010 1:42 PM   Routine Urinalysis   Glucose: negative   (Normal Range: Negative) Bilirubin: negative   (Normal Range: Negative)  Ketone: negative   (Normal Range: Negative) Spec. Gravity: <1.005   (Normal Range: 1.003-1.035) Blood: negative   (Normal Range: Negative) pH: 5.0   (Normal Range: 5.0-8.0) Protein: negative   (Normal Range: Negative) Urobilinogen: 0.2   (Normal Range: 0-1) Nitrite: negative   (Normal Range: Negative) Leukocyte Esterace: negative   (Normal Range: Negative)    Comments: Romualdo Bolk, CMA (AAMA)  August 03, 2010 1:42 PM

## 2010-10-14 NOTE — Medication Information (Signed)
Summary: Nexium Approved  Nexium Approved   Imported By: Maryln Gottron 10/06/2010 10:24:02  _____________________________________________________________________  External Attachment:    Type:   Image     Comment:   External Document

## 2010-11-02 ENCOUNTER — Other Ambulatory Visit (INDEPENDENT_AMBULATORY_CARE_PROVIDER_SITE_OTHER): Payer: Federal, State, Local not specified - PPO | Admitting: Internal Medicine

## 2010-11-02 DIAGNOSIS — E559 Vitamin D deficiency, unspecified: Secondary | ICD-10-CM

## 2010-11-03 LAB — VITAMIN D 25 HYDROXY (VIT D DEFICIENCY, FRACTURES): Vit D, 25-Hydroxy: 35 ng/mL (ref 30–89)

## 2010-11-06 ENCOUNTER — Encounter: Payer: Self-pay | Admitting: Internal Medicine

## 2010-11-09 ENCOUNTER — Ambulatory Visit (INDEPENDENT_AMBULATORY_CARE_PROVIDER_SITE_OTHER): Payer: Federal, State, Local not specified - PPO | Admitting: Internal Medicine

## 2010-11-09 ENCOUNTER — Encounter: Payer: Self-pay | Admitting: Internal Medicine

## 2010-11-09 VITALS — BP 120/80 | HR 91 | Temp 98.7°F | Wt 176.0 lb

## 2010-11-09 DIAGNOSIS — J329 Chronic sinusitis, unspecified: Secondary | ICD-10-CM

## 2010-11-09 DIAGNOSIS — I10 Essential (primary) hypertension: Secondary | ICD-10-CM

## 2010-11-09 DIAGNOSIS — E559 Vitamin D deficiency, unspecified: Secondary | ICD-10-CM

## 2010-11-09 DIAGNOSIS — J309 Allergic rhinitis, unspecified: Secondary | ICD-10-CM

## 2010-11-09 MED ORDER — PREDNISONE 20 MG PO TABS: 60.0000 mg | ORAL_TABLET | Freq: Every day | ORAL | Status: AC

## 2010-11-09 MED ORDER — AMOXICILLIN-POT CLAVULANATE 875-125 MG PO TABS: 1.0000 | ORAL_TABLET | Freq: Two times a day (BID) | ORAL | Status: DC

## 2010-11-09 MED ORDER — FLUTICASONE PROPIONATE 50 MCG/ACT NA SUSP: 2.0000 | Freq: Every day | NASAL | Status: DC

## 2010-11-09 NOTE — Progress Notes (Signed)
  Subjective:    Patient ID: Michele Monroe, female    DOB: 1945-10-22, 65 y.o.   MRN: [id number]  HPI Problem since November  gfot better and now back for a months of UR congestion and  Intermittent face paina dn now diff cough dry and spasm without fever .  Tired of being sick / if allergy and sinus infection.  Never tested but gets problems every year and treated as such.  Trying to eat and exercise more HT palpitations better on 1/2 dose of atenolol Vit d 1/2 otc med Review of Systems Neg fever weats  Cp sob sig ci changes  Inc ln . Bleeding weight loss.  Rest of rox neg or [place]     Objective:   Physical Exam WDWN in nad hoarse and coughing at times   Looks congested  HEENT: Normocephalic ;atraumatic , Eyes;  PERRL, EOMs  Full, lids and conjunctiva clear,,Ears: no deformities, canals nl, TM landmarks normal, Nose: no deformity   Congested and mild max tenderness  Mouth : OP clear without lesion or edema . Neck without masses  Chest:  Clear to A&P without wheezes rales or rhonchi CV:  S1-S2 no gallops or murmurs peripheral perfusion is normal Neuro non focal No clubbing cyanosis or edema         Assessment & Plan:  Rhinitis ? Recurrent chronic with alelrgy rx by [facility]   X 2 once with antibiotic  ? Help never tested for allergy but  Doing som environmental changes   Disc options and will refer   Vit d  Better   Level ok To use otcs.  Palpitations HT controlled

## 2010-11-09 NOTE — Patient Instructions (Signed)
Stay on otc vit d  Take antibiotic and prednisone for  Sinusitis that should also help your cough Then take  Cortisone nose spray every day to control poss allergy sinus problem Someone will contact you about allergy referral.

## 2010-11-14 NOTE — Assessment & Plan Note (Signed)
Controlled.  

## 2010-11-25 ENCOUNTER — Telehealth: Payer: Self-pay | Admitting: *Deleted

## 2010-11-25 NOTE — Telephone Encounter (Signed)
Ok x 2  

## 2010-11-25 NOTE — Telephone Encounter (Signed)
Refill on tramadol 50mg  #60 last filled on 09/05/10

## 2010-11-26 MED ORDER — TRAMADOL HCL 50 MG PO TABS: 50.0000 mg | ORAL_TABLET | Freq: Four times a day (QID) | ORAL | Status: DC | PRN

## 2010-12-30 ENCOUNTER — Encounter: Payer: Self-pay | Admitting: Internal Medicine

## 2010-12-30 ENCOUNTER — Ambulatory Visit (INDEPENDENT_AMBULATORY_CARE_PROVIDER_SITE_OTHER): Payer: Federal, State, Local not specified - PPO | Admitting: Internal Medicine

## 2010-12-30 VITALS — BP 120/80 | HR 60 | Temp 97.8°F | Wt 169.0 lb

## 2010-12-30 DIAGNOSIS — R197 Diarrhea, unspecified: Secondary | ICD-10-CM | POA: Insufficient documentation

## 2010-12-30 DIAGNOSIS — K589 Irritable bowel syndrome without diarrhea: Secondary | ICD-10-CM

## 2010-12-30 NOTE — Patient Instructions (Signed)
This appears like an infectious  Enteritis causing the diarrhea. Most of these get better on their own but some need other meds to get better . Collect the stool specimens as ordered.   IN the meantime eat lite and avoid caffeine alcohol and sugary drinks.  If there are certain germs in your stool sample we may need to add other medication to get better  quicker.

## 2010-12-30 NOTE — Progress Notes (Signed)
  Subjective:    Patient ID: Michele Monroe, female    DOB: October 28, 1945, 65 y.o.   MRN: [id number]  HPI Patient comes in today for sda because of Acute onset of diarrhea x  4 day with cramps. Described as frequent wattery no blood .  Yellowish looking.    Different  Than ibs.      NO exposures undercooked food, well water travel. Po intake: water some  Special milk  Baked potato  Egg.  Tried immodium and no help.   No sick exposures.    10 pounds weight loss.  NO fever. No uri sx and no uti sx  Antibioitc   augmentin   In [facility].   Was unable to work cause of above sx.   Review of Systems Neg for cp sob  Gu issues .  As per hpi  No fever chills.   Past history family history social history reviewed in the electronic medical record.     Objective:   Physical Exam WDWN in nad look somewhat unconfortable but non toxic and well hydrated OP moist mucous membranes NEck no masses  Chest:  Clear to A&P without wheezes rales or rhonchi CV:  S1-S2 no gallops or murmurs peripheral perfusion is normal Abdomen:  Sof,t diffusely increased  bowel sounds without hepatosplenomegaly, no guarding rebound or masses no CVA tenderness. No clubbing cyanosis or edema  Skin : no bruising petechiae nl cap refill.      Assessment & Plan:  Diarrhea  Presumed infectious   Because of severity r/o  Specific causes.   Stool tests .   Sx disc and that it isn't very effective .  The point to avoid dehydration.   With hx of antibiotic use need to  r/o c diff

## 2011-01-01 LAB — GIARDIA/CRYPTOSPORIDIUM (EIA)
Cryptosporidium Screen (EIA): NEGATIVE
Giardia Screen (EIA): NEGATIVE

## 2011-01-01 LAB — CLOSTRIDIUM DIFFICILE EIA: CDIFTX: NEGATIVE

## 2011-01-02 ENCOUNTER — Encounter: Payer: Self-pay | Admitting: Internal Medicine

## 2011-01-04 ENCOUNTER — Telehealth: Payer: Self-pay | Admitting: *Deleted

## 2011-01-04 LAB — STOOL CULTURE

## 2011-01-04 NOTE — Telephone Encounter (Signed)
Left message to call back  

## 2011-01-04 NOTE — Telephone Encounter (Signed)
Message copied by Tor Netters on Mon Jan 04, 2011  1:04 PM ------      Message from: [facility], [place] [facility]      Created: Mon Jan 04, 2011 12:37 PM       Tell patient that her stool tests are negative for specific germ but has inflammation cells present that are consistent with a  Bowel infection. If she is not getting better  plan  follow up .

## 2011-01-05 NOTE — Telephone Encounter (Signed)
Pt is now having constipation but she is feeling a lot better than she did.

## 2011-01-19 NOTE — Assessment & Plan Note (Signed)
OFFICE VISIT   ARABIA, NYLUND B  DOB:  1946-01-30                                       07/12/2008  EAVWU#:[id number]   The patient presents today for 6-week followup after laser ablation of  her right greater saphenous and small saphenous vein, also stab  phlebectomy.  She is quite pleased with the result.  She reports that  she had complete resolution of her discomfort.  Her stab phlebectomy  incision are all healing quite nicely.  I did image her veins with hand-  held duplex, and this shows closure of her great and small saphenous  vein.  She does have telangiectasia around her ankles and wants to  consider sclerotherapy of this at some point.  I explained that this  would be not covered by insurance and explained the out of pocket  estimated expense.  She will notify us if she wishes to proceed.   Larina Earthly, M.D.  Electronically Signed   TFE/MEDQ  D:  07/12/2008  T:  07/15/2008  Job:  [id number]   cc:   Lovenia Kim, D.O.

## 2011-01-19 NOTE — Assessment & Plan Note (Signed)
OFFICE VISIT   Michele Monroe, [name]  DOB:  Dec 08, 1945                                       05/29/2008  ZOXWR#:[id number]   The patient presents 1 week followup of a laser ablation of her right  great and small saphenous vein and also stab phlebectomy of multiple  tributaries around the level of her knee.  She has done quite well.  She  does have the usual amount of soreness at the ablation site.  She does  have some mild bruising at the phlebectomy site on her knee.  She also  has irritation from the stocking at the posterior fossa.   She underwent duplex today and this reveals closure of her great and  small saphenous vein with ablation and no evidence of deep venous  thrombosis.  I discussed this with the patient.  She is quite pleased  with her initial result as am I.  I plan to see her again in 6 weeks for  final followup.   Larina Earthly, M.D.  Electronically Signed   TFE/MEDQ  D:  05/29/2008  T:  05/30/2008  Job:  [id number]   cc:   Lovenia Kim, D.O.

## 2011-01-19 NOTE — Procedures (Signed)
DUPLEX DEEP VENOUS EXAM - LOWER EXTREMITY   INDICATION:  Follow-up evaluation post one week right greater saphenous  anterior branch and lesser saphenous vein ablation.   HISTORY:  Edema:  Right leg edema.  Trauma/Surgery:  Prior surgery of right greater saphenous vein ligation.  Pain:  Right leg pain.  PE:  No.  Previous DVT:  No.  Anticoagulants:  No.  Other:   DUPLEX EXAM:                CFV   SFV   PopV  PTV    GSV                R  L  R  L  R  L  R   L  R  L  Thrombosis    o     o     o     o      +  Spontaneous   +     +     +     +      0  Phasic        +     +     +     +      0  Augmentation  +     +     +     +      0  Compressible  +     +     +     +      0  Competent     +     +     +     +   Legend:  + - yes  o - no  p - partial  D - decreased   IMPRESSION:  1. No evidence of deep venous thrombosis or significant deep vein      incompetence.  2. The anterior branch of the right greater saphenous vein is      thrombosed from the saphenofemoral junction to the distal thigh.      The anterior branch of the greater saphenous vein is compressible      just above the knee.  3. The right short saphenous vein is thrombosed approximately 4 cm      distal to the saphenopopliteal junction.  The thrombus extends      through the short saphenous vein into the distal calf.  No deep      venous thrombosis is seen in the right common femoral vein or      popliteal vein.    _____________________________  Larina Earthly, M.D.   MC/MEDQ  D:  05/29/2008  T:  05/29/2008  Job:  [id number]

## 2011-01-19 NOTE — Assessment & Plan Note (Signed)
OFFICE VISIT   Michele Monroe, Michele Monroe  DOB:  08-27-1946                                       04/12/2008  ZOXWR#:[id number]   The patient presents today for continued followup and discussion  regarding her bilateral lower extremity venous pathology.  It is worse  on her right leg than on her left leg.  She reports that she has not had  any symptom relief with her thigh-high 20-30 mmHg compression garments.  She continues to have leg pain, throbbing, and tenderness making it  difficult to fall asleep.  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  She reports that the leg pain and swelling  makes prolonged standing extremely difficult.  She is unable to squat  secondary to leg pain and swelling, and is unable to do house work and  yard work.  She does elevate her legs when possible and takes Advil for  discomfort.  She did undergo carotid venous duplex at an outlying vein  center showing reflux in her small saphenous vein and also anterior  branch of her right great saphenous vein that leads into the  varicosities at the level of her patella.  I have recommended that we  proceed with ablation of her right anterior great saphenous vein and  also her small saphenous vein for reduction of venous reflux, and also  for stab phlebectomy of the varicosities around the level of her knee.  We will schedule this as soon as we have assured insurance coverage for  her.   Larina Earthly, M.D.  Electronically Signed   TFE/MEDQ  D:  04/12/2008  T:  04/15/2008  Job:  [id number]   cc:   Lovenia Kim, D.O.

## 2011-01-19 NOTE — Consult Note (Signed)
NEW PATIENT CONSULTATION   Forstrom, Ravonda B  DOB:  09/28/1945                                       02/07/2008  BJYNW#:[id number]   The patient presents today for evaluation of painful right leg venous  varicosities.  She is has had a prior history of phlebectomy of mid  thigh veins approximately 20 years ago.  She reports recurrent symptoms  of a tired heavy throbbing, aching sensation in her right leg from her  knee distally.  She does have some reticular varicosities over her  patellar region but has a heaviness in her posterior calf and increasing  small varices in her calf.  She denies any prior bleeding or history of  deep venous thrombosis.  She has been wearing thigh-high 30-40 mm  compression garments since January.  Reports no response to this  treatment.  She does elevate her legs when possible and takes ibuprofen  600 mg t.i.d. for discomfort.  She had seen outlying vein center and her  vascular lab studies and notes were reviewed.  She also had a  noninvasive venous duplex for review.   PHONE NUMBER:  Significant for hypertension and migraines.  She does  have elevated cholesterol well.   SOCIAL HISTORY:  She is widowed with one child.  She works as an Emergency planning/management officer.  She does not smoke, having quit 31 years appears ago.  Does  not drink alcohol on a regular basis.   REVIEW OF SYSTEMS:  Positive for weight gain.  Her weight is now in 177  pounds, 5 feet 6 inches tall.  She does report that pain with walking headaches arthritic joint pain,  depression.   ALLERGIES TO MEDICATIONS:  None.   CURRENT MEDICATIONS:  Zetia, atenolol, Singulair, Nexium, baby aspirin,  Cymbalta.   PHYSICAL EXAM:  Well-developed, well-nourished white female appearing  stated age of 29.  Blood pressure 118/69, pulse 68, respirations 18.  Her radial, dorsalis pedis pulses are 2+ bilaterally.  She does have  several incisions in her medial thigh on the right from prior  phlebectomy.  She does have scattered reticular varicosities, most  prominently over the right prepatellar region.  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  She underwent handheld duplex by me and  this did confirm reflux in her small saphenous vein which was dilated  the right and also reflux in the anterior branch of her great saphenous  vein.  I have reviewed her duplex from outlying vein center and this  does show incompetence of these as well with normal deep system.  The  best option for this patient so she will have improvement of pain with  ablation of her small saphenous vein and her anterior saphenous branch.  These both appear to be feeding the area of varicosities in the area of  pain.  She is quite concerned regarding finances.  I find that she was  charged an extremely large some for her treatment of 20 years ago at  vein center, upwards of $20-30,000.  She is also quoted very high for  her treatment from an outlying vein center.  I discussed this would be  seen in the insurance and would be we would obtain pre-approval, and her  pocket expenses should be fraction of what she had been quoted.  We  continue discussion and proceed  with the treatment at her convenience.   Larina Earthly, M.D.  Electronically Signed   TFE/MEDQ  D:  02/07/2008  T:  02/08/2008  Job:  [id number]   cc:   Lovenia Kim, D.O.

## 2011-02-07 ENCOUNTER — Other Ambulatory Visit: Payer: Self-pay | Admitting: Internal Medicine

## 2011-02-08 NOTE — Telephone Encounter (Signed)
Per Dr. Panosh- ok x 1 

## 2011-02-26 ENCOUNTER — Other Ambulatory Visit: Payer: Self-pay | Admitting: Internal Medicine

## 2011-02-27 ENCOUNTER — Other Ambulatory Visit: Payer: Self-pay | Admitting: Internal Medicine

## 2011-03-18 ENCOUNTER — Other Ambulatory Visit: Payer: Self-pay | Admitting: Internal Medicine

## 2011-03-19 NOTE — Telephone Encounter (Signed)
Ok to refill x 3 

## 2011-03-19 NOTE — Telephone Encounter (Signed)
LOV 12/30/10 NOV NONE PLEASE ADVISE

## 2011-03-28 ENCOUNTER — Other Ambulatory Visit: Payer: Self-pay | Admitting: Internal Medicine

## 2011-03-29 ENCOUNTER — Other Ambulatory Visit: Payer: Self-pay | Admitting: Internal Medicine

## 2011-03-29 NOTE — Telephone Encounter (Signed)
LOV 12/30/10 NOV NONE PLEASE ADVISE

## 2011-03-31 ENCOUNTER — Telehealth: Payer: Self-pay | Admitting: *Deleted

## 2011-03-31 MED ORDER — TRAMADOL HCL 50 MG PO TABS: 50.0000 mg | ORAL_TABLET | Freq: Four times a day (QID) | ORAL | Status: DC | PRN

## 2011-03-31 NOTE — Telephone Encounter (Signed)
Ok    To refill x 2  

## 2011-03-31 NOTE — Telephone Encounter (Signed)
Refill on tramadol #30  Last ov 11/09/10 Nov none

## 2011-03-31 NOTE — Telephone Encounter (Signed)
rx sent to pharmacy

## 2011-04-01 NOTE — Telephone Encounter (Signed)
i dont know what this is . What med requested?

## 2011-06-15 ENCOUNTER — Telehealth: Payer: Self-pay | Admitting: *Deleted

## 2011-06-15 NOTE — Telephone Encounter (Signed)
Pt is calling complaining of cough x 2 weeks.  Appt scheduled with Dr. Fabian Sharp tomorrow.

## 2011-06-16 ENCOUNTER — Ambulatory Visit (INDEPENDENT_AMBULATORY_CARE_PROVIDER_SITE_OTHER): Payer: Federal, State, Local not specified - PPO | Admitting: Internal Medicine

## 2011-06-16 ENCOUNTER — Encounter: Payer: Self-pay | Admitting: Internal Medicine

## 2011-06-16 VITALS — BP 130/80 | HR 74 | Wt 171.0 lb

## 2011-06-16 DIAGNOSIS — J209 Acute bronchitis, unspecified: Secondary | ICD-10-CM

## 2011-06-16 DIAGNOSIS — J208 Acute bronchitis due to other specified organisms: Secondary | ICD-10-CM

## 2011-06-16 DIAGNOSIS — J309 Allergic rhinitis, unspecified: Secondary | ICD-10-CM

## 2011-06-16 DIAGNOSIS — I10 Essential (primary) hypertension: Secondary | ICD-10-CM

## 2011-06-16 MED ORDER — PREDNISONE 20 MG PO TABS: ORAL_TABLET | ORAL | Status: AC

## 2011-06-16 MED ORDER — AZITHROMYCIN 250 MG PO TABS: ORAL_TABLET | ORAL | Status: AC

## 2011-06-16 NOTE — Patient Instructions (Addendum)
Antibiotic  for poss secondary  infection  And prednisone for 5 days . For the wheezing. Should be getting better in the ext 5-7 days.

## 2011-06-16 NOTE — Progress Notes (Signed)
  Subjective:    Patient ID: Michele Monroe, female    DOB: 09/28/1945, 65 y.o.   MRN: [id number]  HPI Patient comes in today for SDA  For acute problem evaluation. Having ur congestion and cough for over 3 weeks . See above  Green phelgm  Coughing  Up at this point . No blood no fever.  Now is 3 weeks into this. And getting worse GK  And sniffles  And problems  Off and on since labor day.  ? What to take Hx of neg allergy testing Review of Systems No fever cp sob but very bad cough and poss wheezing no NVD no new rashess.  Past history family history social history reviewed in the electronic medical record.     Objective:   Physical Exam Physical Exam: Vital signs reviewed WUJ:WJXB is a well-developed well-nourished alert cooperative  white female who appears her stated age in no acute distress.   Hoarse and congested  HEENT: normocephalic  traumatic , Eyes: PERRL EOM's full, conjunctiva clear, Nares: paten,t no deformity  Some congesteion and face tender bilaterlly or tenderness., Ears: no deformity EAC's clear TMs with normal landmarks. Mouth: clear OP, no lesions, edema.  Moist mucous membranes. Dentition in adequate repair. NECK: supple without masses, thyromegaly or bruits. CHEST/PULM:    Wheezing and bronchial sounds   Very wheezy deep cough good air bs CV: PMI is nondisplaced, S1 S2 no gallops, murmurs, rubs. Peripheral pulses without  delay CVS1S2 no g or m .No JVD .  Extremtities:  No clubbing cyanosis or edema, no acute joint swelling or redness no focal atrophy NEURO:  Oriented x3, cranial nerves 3-12 appear to be intact, no obvious focal weakness,gait within normal limits  SKIN: No acute rashes normal turgor, color, no bruising or petechiae. PSYCH: Oriented, good eye contact, no obvious depression anxiety, cognition and judgment appear normal.      Assessment & Plan:  Prolonged coughing illness with wheezing asthmatic bronchitis with secondary infection by clinical  sx    Empiric rx as before antibiotic and prednisone   Expectant management.   Ht stable Allergic?

## 2011-06-18 DIAGNOSIS — B9689 Other specified bacterial agents as the cause of diseases classified elsewhere: Secondary | ICD-10-CM | POA: Insufficient documentation

## 2011-06-23 ENCOUNTER — Telehealth: Payer: Self-pay | Admitting: *Deleted

## 2011-06-23 DIAGNOSIS — R05 Cough: Secondary | ICD-10-CM

## 2011-06-23 MED ORDER — HYDROCODONE-HOMATROPINE 5-1.5 MG/5ML PO SYRP: 5.0000 mL | ORAL_SOLUTION | ORAL | Status: AC | PRN

## 2011-06-23 MED ORDER — CHLORPHENIRAMINE-HYDROCODONE 8-10 MG/5ML PO LQCR: 5.0000 mL | ORAL | Status: DC | PRN

## 2011-06-23 NOTE — Telephone Encounter (Signed)
Addended byBerniece Andreas K on: 06/23/2011 06:03 PM   Modules accepted: Orders

## 2011-06-23 NOTE — Telephone Encounter (Signed)
hydrocodone cough syrup is different than tussionex please call  Pharmacy this is the wrong medication for this sig  i called the pharmacy  anc changes to hycodan  As above

## 2011-06-23 NOTE — Telephone Encounter (Signed)
Notified pt. Pt got some better, but worse after finishing the antibiotic and steroids.  No fever as previously noted.

## 2011-06-23 NOTE — Telephone Encounter (Signed)
Pt is calling with continuing with congestion in sinus and chest with coughing.  No fever.  Cough is mostly non productive, and has finished the Prednisone and Zpack.  Requesting a different RX to clear this up, and cough meds.

## 2011-06-23 NOTE — Telephone Encounter (Signed)
Would order a chest x ray  For dx cough  alos she can use hydocodone cough med at night is needed 4 ox 1-2 tsp every 4-6 hours in the meantime but his is comfort medication. Does she have a fever?   Did  It get better with the prednisone and then get worse?

## 2011-06-24 ENCOUNTER — Ambulatory Visit (INDEPENDENT_AMBULATORY_CARE_PROVIDER_SITE_OTHER)
Admission: RE | Admit: 2011-06-24 | Discharge: 2011-06-24 | Disposition: A | Discharge status: Home / Self Care | Payer: Federal, State, Local not specified - PPO | Source: Ambulatory Visit | Attending: Internal Medicine | Admitting: Internal Medicine

## 2011-06-24 DIAGNOSIS — R059 Cough, unspecified: Secondary | ICD-10-CM

## 2011-06-24 DIAGNOSIS — R05 Cough: Secondary | ICD-10-CM

## 2011-06-28 ENCOUNTER — Telehealth: Payer: Self-pay | Admitting: *Deleted

## 2011-06-28 NOTE — Telephone Encounter (Signed)
Request for Xray results from 06/24/11. Patient informed.

## 2011-07-01 NOTE — Progress Notes (Signed)
Pt aware of results 

## 2011-07-13 ENCOUNTER — Other Ambulatory Visit: Payer: Self-pay | Admitting: Internal Medicine

## 2011-07-13 DIAGNOSIS — Z1231 Encounter for screening mammogram for malignant neoplasm of breast: Secondary | ICD-10-CM

## 2011-07-18 ENCOUNTER — Other Ambulatory Visit: Payer: Self-pay | Admitting: Internal Medicine

## 2011-07-23 ENCOUNTER — Telehealth: Payer: Self-pay | Admitting: *Deleted

## 2011-07-23 MED ORDER — AMOXICILLIN-POT CLAVULANATE 875-125 MG PO TABS: 1.0000 | ORAL_TABLET | Freq: Two times a day (BID) | ORAL | Status: AC

## 2011-07-23 NOTE — Telephone Encounter (Signed)
Spoke to pt- runny nose, congestion, coughing, no sob, sinus headaches, no fever. Pt wants to know if it's okay to take mucinex dm.  Per Dr. Fabian Sharp- okay to take mucinex dm. If she thinks that sinus infection we can call in Augmentin 875 1 bid x 10 days. Pt aware and if this doesn't help then she will make an appt.

## 2011-07-23 NOTE — Telephone Encounter (Signed)
Pt is having recurrent symptoms, green mucus from chest and nose.  Headache.  Is not taking anything right now for symptoms.

## 2011-08-11 ENCOUNTER — Ambulatory Visit (HOSPITAL_COMMUNITY)
Admission: RE | Admit: 2011-08-11 | Discharge: 2011-08-11 | Disposition: A | Discharge status: Home / Self Care | Payer: Federal, State, Local not specified - PPO | Source: Ambulatory Visit | Attending: Internal Medicine | Admitting: Internal Medicine

## 2011-08-11 DIAGNOSIS — Z1231 Encounter for screening mammogram for malignant neoplasm of breast: Secondary | ICD-10-CM | POA: Insufficient documentation

## 2011-08-15 ENCOUNTER — Other Ambulatory Visit: Payer: Self-pay | Admitting: Internal Medicine

## 2011-08-16 ENCOUNTER — Other Ambulatory Visit: Payer: Self-pay | Admitting: Internal Medicine

## 2011-08-21 ENCOUNTER — Other Ambulatory Visit: Payer: Self-pay | Admitting: Internal Medicine

## 2011-08-28 ENCOUNTER — Other Ambulatory Visit: Payer: Self-pay | Admitting: Internal Medicine

## 2011-09-28 ENCOUNTER — Telehealth: Payer: Self-pay | Admitting: *Deleted

## 2011-09-28 NOTE — Telephone Encounter (Signed)
Pt wants to know if she can take Tumeric for arthritis with her other meds.

## 2011-09-29 NOTE — Telephone Encounter (Signed)
Spoke with patient.

## 2011-09-29 NOTE — Telephone Encounter (Signed)
I am not aware of any interaction or safety issues .  But can ask  Her Pharmacist

## 2011-10-06 ENCOUNTER — Ambulatory Visit (INDEPENDENT_AMBULATORY_CARE_PROVIDER_SITE_OTHER): Payer: Federal, State, Local not specified - PPO | Admitting: Internal Medicine

## 2011-10-06 ENCOUNTER — Encounter: Payer: Self-pay | Admitting: Internal Medicine

## 2011-10-06 VITALS — BP 120/80 | HR 73 | Temp 99.0°F | Wt 170.0 lb

## 2011-10-06 DIAGNOSIS — R229 Localized swelling, mass and lump, unspecified: Secondary | ICD-10-CM

## 2011-10-06 DIAGNOSIS — J988 Other specified respiratory disorders: Secondary | ICD-10-CM

## 2011-10-06 DIAGNOSIS — B9789 Other viral agents as the cause of diseases classified elsewhere: Secondary | ICD-10-CM | POA: Insufficient documentation

## 2011-10-06 NOTE — Patient Instructions (Signed)
This appears like a viral respiratory infection.  And it should be a lot better in another 5 days or so but cough  Could last longer 2 weeks or more. You are probably contagious .  YOu can take sudafed for sinus pressure for the next 2 days or so. If getting fever or if the face pain not improving in the next 3 days  Then call for advice .  I think the knot in our hand is from arthritis.   Consider seeing a specialist if getting worse or numbness in hands.

## 2011-10-06 NOTE — Progress Notes (Signed)
  Subjective:    Patient ID: Michele Monroe, female    DOB: 10-17-1945, 66 y.o.   MRN: [id number]  HPI Patient comes in today for SDA  For acute problem evaluation. Sick x 2 days with  st and   Coughing and congestion  And ears hurt   No fever.  Some sore throat missed work for 2 days . No face pain sob chills.  Has nodules on back of right fingers  Hands get pain and stiffness  ?from arthritis   ocass numbness Prominent know over middle joint middle finger     Review of Systems Neg cp sob NVD new rash  Requests meds be re written and filled with new insurance 90 days   Past history family history social history reviewed in the electronic medical record. Outpatient Encounter Prescriptions as of 10/06/2011  Medication Sig Dispense Refill  . atenolol (TENORMIN) 25 MG tablet TAKE 1 TABLET EVERY DAY AS NEEDED FOR BLOOD PRESSURE/MIGRAINE  90 tablet  3  . cetirizine (ZYRTEC) 10 MG tablet Take 10 mg by mouth daily.        . fenofibrate micronized (LOFIBRA) 134 MG capsule TAKE ONE CAPSULE BY MOUTH EVERY DAY  30 capsule  11  . MAXALT 10 MG tablet TAKE 1 TABLET FOR HEADACHE MAY REPEAT IN 2 HOURS. TAKE NO MORE THAN 2 TABLETS IN 24 HOURS  18 tablet  2  . NEXIUM 40 MG capsule TAKE 1 CAPSULE EVERY DAY  30 capsule  11  . traMADol (ULTRAM) 50 MG tablet TAKE 1 TABLET (50 MG TOTAL) BY MOUTH EVERY 6 (SIX) HOURS AS NEEDED FOR PAIN.  30 tablet  0  . ZETIA 10 MG tablet TAKE 1 TABLET BY MOUTH EVERY DAY  30 tablet  2  . DISCONTD: fluticasone (FLONASE) 50 MCG/ACT nasal spray 2 sprays by Nasal route daily.  16 g  2       Objective:   Physical Exam WDWN in NAD  quiet respirations; mildly congested  somewhat hoarse. Non toxic . HEENT: Normocephalic ;atraumatic , Eyes;  PERRL, EOMs  Full, lids and conjunctiva clear,,Ears: no deformities, canals nl, TM landmarks normal, Nose: no deformity or discharge but congested;face minimally tender Mouth : OP clear without lesion or edema . Neck: Supple without adenopathy or  masses or bruits Chest:  Clear to A&P without wheezes rales or rhonchi CV:  S1-S2 no gallops or murmurs peripheral perfusion is normal Skin :nl perfusion and no acute rashes  Hands right few soft nodules round onxtensor  Region of joints  Minimal on left . Good rom and nl color.       Assessment & Plan:  Acute rti  At this time it appears to be uncomplicated and viral in nature.  Expectant management and symptom relief treatment. Call with alarm features.  Nodules right hand possibly related to arthritis no major deformity otherwise if persistent and progressive consider further evaluation  Such as  x-ray or a rheumatologic consult  We'll call us about 90 day prescription and instructions.

## 2011-10-08 ENCOUNTER — Ambulatory Visit: Payer: Federal, State, Local not specified - PPO | Admitting: Internal Medicine

## 2011-10-14 ENCOUNTER — Telehealth: Payer: Self-pay | Admitting: *Deleted

## 2011-10-14 NOTE — Telephone Encounter (Signed)
Nexium and maxalt needs prior auth. We need to call [phone].   Atenolol, tramadol, zetia and fenofibrate needs to be sent to [facility]. Then once we get the nexium and maxalt approved then send those to [facility] as well.

## 2011-10-15 NOTE — Telephone Encounter (Signed)
What am i supposed to do with this message?  Ok to do the 90 days f  On those meds I prescribe. ? When due of her yearly check and labs   Can refill up until then ( atenolol zetia fenofibrate )    I dont do tramadol for 90  Days as this is a controlled med for pain prn.  Ask how often she is using this now. So we can refill appropriate amount

## 2011-10-15 NOTE — Telephone Encounter (Signed)
Pt is due for a yearly cpx. Left message for pt to call back to get how often she is taking tramadol

## 2011-10-18 ENCOUNTER — Telehealth: Payer: Self-pay | Admitting: *Deleted

## 2011-10-18 MED ORDER — FENOFIBRATE MICRONIZED 134 MG PO CAPS: 134.0000 mg | ORAL_CAPSULE | Freq: Every day | ORAL | Status: DC

## 2011-10-18 MED ORDER — EZETIMIBE 10 MG PO TABS: 10.0000 mg | ORAL_TABLET | Freq: Every day | ORAL | Status: DC

## 2011-10-18 MED ORDER — ATENOLOL 25 MG PO TABS: 25.0000 mg | ORAL_TABLET | Freq: Every day | ORAL | Status: DC

## 2011-10-18 NOTE — Telephone Encounter (Signed)
See previous message

## 2011-10-18 NOTE — Telephone Encounter (Signed)
Spoke to pt- is only taken the tramadol every 2- 3 days. [facility] [address] [facility]. Pt wants others called into [facility]. Others sent into [facility].

## 2011-10-18 NOTE — Telephone Encounter (Signed)
Requesting to speak to [facility] re: her mail order medications.

## 2011-10-22 NOTE — Telephone Encounter (Signed)
Can rx 40  Tramadol and refill x 1

## 2011-10-25 MED ORDER — TRAMADOL HCL 50 MG PO TABS: 50.0000 mg | ORAL_TABLET | Freq: Three times a day (TID) | ORAL | Status: AC | PRN

## 2011-10-25 NOTE — Telephone Encounter (Signed)
rx sent to pharmacy

## 2011-11-18 ENCOUNTER — Encounter: Payer: Self-pay | Admitting: Internal Medicine

## 2011-11-18 ENCOUNTER — Ambulatory Visit (INDEPENDENT_AMBULATORY_CARE_PROVIDER_SITE_OTHER): Payer: Federal, State, Local not specified - PPO | Admitting: Internal Medicine

## 2011-11-18 VITALS — BP 130/60 | HR 69 | Wt 177.0 lb

## 2011-11-18 DIAGNOSIS — H9319 Tinnitus, unspecified ear: Secondary | ICD-10-CM

## 2011-11-18 DIAGNOSIS — R0989 Other specified symptoms and signs involving the circulatory and respiratory systems: Secondary | ICD-10-CM

## 2011-11-18 DIAGNOSIS — I10 Essential (primary) hypertension: Secondary | ICD-10-CM

## 2011-11-18 DIAGNOSIS — R42 Dizziness and giddiness: Secondary | ICD-10-CM

## 2011-11-18 MED ORDER — PREDNISONE 20 MG PO TABS: ORAL_TABLET | ORAL | Status: AC

## 2011-11-18 MED ORDER — AMOXICILLIN-POT CLAVULANATE 875-125 MG PO TABS: 1.0000 | ORAL_TABLET | Freq: Two times a day (BID) | ORAL | Status: AC

## 2011-11-18 NOTE — Progress Notes (Signed)
Subjective:    Patient ID: Michele Monroe, female    DOB: 01-19-1946, 66 y.o.   MRN: [id number]  HPI Patient comes in today for SDA for  new problem evaluation. Having dizziness  Issue when  In certain positions  For about  A month  Insidious onset   When gets out of bed .  Then normal after a few minutes  And then when lay down gets room  spins.   sometimes when walks dog and runs gets dizzy .  Like a roller coaster  When goes to sleep open and close eyes to feel better   .   Sinuses  And" allergies"  Terrible   And now spit of green  Stuff again. Some ringing right ear at times non pulsatile   Recently  had to go to eye doc and left eye was matted shut. And gave drops.  Nose congestion every day . Some sneezing   Using zyrtec an yrtec d. And claritin.  NO vision change but daze when staring at computer   Hx of allergy testing a year ago and said she was not allergic . Only rarely takes pain pill x 2 per month or so.  Review of Systems Neg cp sob fever ha diplopia  ? Hearing .  No spells of vomiting ha . Falling   To see hand surgeon soon for finger issues  Left shoulder pain with rotation no injury  Lifts 66 yo at times   Past history family history social history reviewed in the electronic medical record. Outpatient Prescriptions Prior to Visit  Medication Sig Dispense Refill  . cetirizine (ZYRTEC) 10 MG tablet Take 10 mg by mouth daily.        Marland Kitchen ezetimibe (ZETIA) 10 MG tablet Take 1 tablet (10 mg total) by mouth daily.  90 tablet  0  . fenofibrate micronized (LOFIBRA) 134 MG capsule Take 1 capsule (134 mg total) by mouth daily before breakfast.  90 capsule  0  . MAXALT 10 MG tablet TAKE 1 TABLET FOR HEADACHE MAY REPEAT IN 2 HOURS. TAKE NO MORE THAN 2 TABLETS IN 24 HOURS  18 tablet  2  . NEXIUM 40 MG capsule TAKE 1 CAPSULE EVERY DAY  30 capsule  11  . traMADol (ULTRAM) 50 MG tablet TAKE 1 TABLET (50 MG TOTAL) BY MOUTH EVERY 6 (SIX) HOURS AS NEEDED FOR PAIN.  30 tablet  0  . atenolol  (TENORMIN) 25 MG tablet Take 1 tablet (25 mg total) by mouth daily.  90 tablet  0       Objective:   Physical Exam Well-developed well-nourished in no acute distress looks  or congestive Cognitively intact normal speech and affect. HEENT: Normocephalic ;atraumatic , Eyes;  PERRL, EOMs  Full, lids and conjunctiva clear,,Ears: no deformities, canals nl some was in left eac but tm grey  TM landmarks normal, Nose: increase turbinated no dc  Tender paranasal area Mouth : OP clear without lesion or edema . Tongue midline Neck: Supple without adenopathy or masses or bruits Chest:  Clear to A&P without wheezes rales or rhonchi CV:  S1-S2 no gallops or murmurs peripheral perfusion is normal Abdomen:  Sof,t normal bowel sounds without hepatosplenomegaly, no guarding rebound or masses no CVA tenderness NEURO: oriented x 3 CN 3-12 appear intact.  Gets dizzy when lays down quickly and upright and then is ok  No focal muscle weakness or atrophy.. Gait WNL.  Grossly non focal. No tremor or abnormal movement. Heel to  although slow.  rhomberg neg but feels unsteady  No drift.      Assessment & Plan:  Positional vertigo .  For a month in setting of congestion and ? If tinnitus or nor. Poss chronic sinusitis  Left more than right ?  Hx of allergy  testing negative  Treat with antibiotic and pred for now and get ent consult. Uncertain if needs further testing or PT if persists .   HT seems stable  Doesn't seem from meds at this point.

## 2011-11-18 NOTE — Patient Instructions (Signed)
Your symptoms sound like positional vertigo. Which means spinning sensation that occurs when he changes position. This can be from an inner ear problem but could be from her chronic sinus congestion. We are going to treat you for sinus infection with antibiotic and prednisone to decrease inflammation and get a consult from the ear nose and throat doctor to do a good exam and look for causes of the dizziness.   If in the meantime you're getting worse or have a fever contact us for advice.    Vertigo Vertigo means you feel like you or your surroundings are moving when they are not. Vertigo can be dangerous if it occurs when you are at work, driving, or performing difficult activities.  CAUSES  Vertigo occurs when there is a conflict of signals sent to your brain from the visual and sensory systems in your body. There are many different causes of vertigo, including:  Infections, especially in the inner ear.   A bad reaction to a drug or misuse of alcohol and medicines.   Withdrawal from drugs or alcohol.   Rapidly changing positions, such as lying down or rolling over in bed.   A migraine headache.   Decreased blood flow to the brain.   Increased pressure in the brain from a head injury, infection, tumor, or bleeding.  SYMPTOMS  You may feel as though the world is spinning around or you are falling to the ground. Because your balance is upset, vertigo can cause nausea and vomiting. You may have involuntary eye movements (nystagmus). DIAGNOSIS  Vertigo is usually diagnosed by physical exam. If the cause of your vertigo is unknown, your caregiver may perform imaging tests, such as an MRI scan (magnetic resonance imaging). TREATMENT  Most cases of vertigo resolve on their own, without treatment. Depending on the cause, your caregiver may prescribe certain medicines. If your vertigo is related to body position issues, your caregiver may recommend movements or procedures to correct the  problem. In rare cases, if your vertigo is caused by certain inner ear problems, you may need surgery. HOME CARE INSTRUCTIONS   Follow your caregiver's instructions.   Avoid driving.   Avoid operating heavy machinery.   Avoid performing any tasks that would be dangerous to you or others during a vertigo episode.   Tell your caregiver if you notice that certain medicines seem to be causing your vertigo. Some of the medicines used to treat vertigo episodes can actually make them worse in some people.  SEEK IMMEDIATE MEDICAL CARE IF:   Your medicines do not relieve your vertigo or are making it worse.   You develop problems with talking, walking, weakness, or using your arms, hands, or legs.   You develop severe headaches.   Your nausea or vomiting continues or gets worse.   You develop visual changes.   A family member notices behavioral changes.   Your condition gets worse.  MAKE SURE YOU:  Understand these instructions.   Will watch your condition.   Will get help right away if you are not doing well or get worse.  Document Released: 06/02/2005 Document Revised: 08/12/2011 Document Reviewed: 03/11/2011 [facility] Patient Information 2012 [place], [place].

## 2011-11-20 ENCOUNTER — Other Ambulatory Visit: Payer: Self-pay | Admitting: Internal Medicine

## 2011-11-22 NOTE — Telephone Encounter (Signed)
Per Dr. Panosh- ok x 2  

## 2011-12-08 ENCOUNTER — Other Ambulatory Visit: Payer: Self-pay | Admitting: Internal Medicine

## 2011-12-08 NOTE — Telephone Encounter (Signed)
Pt called requesting atenolol, zetia, fenofibrate to e faxed to her office to send to mail order.  Pt's fax is [phone].

## 2012-01-05 ENCOUNTER — Telehealth: Payer: Self-pay | Admitting: *Deleted

## 2012-01-05 NOTE — Telephone Encounter (Signed)
Appt scheduled

## 2012-01-05 NOTE — Telephone Encounter (Signed)
Would not add antibiotic at this time .  Please see last note where she was rx with augmentin and pred . In [facility].   Did she get better? Did she see ent yet.   Blisters in mouth usually are from viral infection . Unsure how to advise her without an OV.

## 2012-01-05 NOTE — Telephone Encounter (Signed)
Pls advise.  

## 2012-01-05 NOTE — Telephone Encounter (Signed)
Pt is complaining of sinus congestion with productive cough and nasal mucus that is yellow and green.  Has sore throat, fever blisters in mouth, no fever.  All symptoms x 5-6 days.  Would like Rx sent to pharmacy.

## 2012-01-06 ENCOUNTER — Telehealth: Payer: Self-pay | Admitting: *Deleted

## 2012-01-06 NOTE — Telephone Encounter (Signed)
Pt has appt next week but cannot wait as she is getting sicker everyday with sinus complaints.  Asking for an appt on Friday am.  Done.

## 2012-01-06 NOTE — Telephone Encounter (Signed)
Noted  

## 2012-01-07 ENCOUNTER — Encounter: Payer: Self-pay | Admitting: Internal Medicine

## 2012-01-07 ENCOUNTER — Ambulatory Visit (INDEPENDENT_AMBULATORY_CARE_PROVIDER_SITE_OTHER): Payer: Federal, State, Local not specified - PPO | Admitting: Internal Medicine

## 2012-01-07 VITALS — BP 108/78 | HR 80 | Temp 98.4°F | Wt 178.0 lb

## 2012-01-07 DIAGNOSIS — J309 Allergic rhinitis, unspecified: Secondary | ICD-10-CM

## 2012-01-07 DIAGNOSIS — J329 Chronic sinusitis, unspecified: Secondary | ICD-10-CM

## 2012-01-07 MED ORDER — MOMETASONE FUROATE 50 MCG/ACT NA SUSP: 2.0000 | Freq: Every day | NASAL | Status: DC

## 2012-01-07 MED ORDER — CEFUROXIME AXETIL 500 MG PO TABS: 500.0000 mg | ORAL_TABLET | Freq: Two times a day (BID) | ORAL | Status: AC

## 2012-01-07 NOTE — Patient Instructions (Addendum)
Your sinus infection may be triggered by chronic allergies. We can add an antibiotic again I want you to add a decongestant and take nasal cortisone every day even after you are better. This may prevent the sinus congestion that helps get you an infection.  If you continue to get recurrent sinus infections or respiratory problems we need to have you see an ear nose and throat doctor as we discussed.  Saline or salt water sprays and irritation of your sinuses can also be helpful to heal.    Return in1- 2 months to check on about how we are doing. There are other medications that might be helpful. Such as Singulair.

## 2012-01-07 NOTE — Progress Notes (Signed)
  Subjective:    Patient ID: Michele Monroe, female    DOB: April 16, 1946, 66 y.o.   MRN: [id number]  HPI  Patient comes in today for SDA for  Recurrent  problem evaluation. See last  Time  Ov took about 3 weeks to get better .  Last ent.years ago didn't go to ent cause got better  Head always hurts and drainnage some sore throat.    awoke st and runny nose and felt allergies about 8 days ago ;stayed in bed  all weekend.   Then blister fever and used abreva .  Developed coughing    Head hurts and presssure .   Continuing  .seems to get   These problems early spring and early fall   blood pressure ok .  PLAN:Review of Systems No fever cp sob wheezing syncope or vision change Past history family history social history reviewed in the electronic medical record. Outpatient Prescriptions Prior to Visit  Medication Sig Dispense Refill  . atenolol (TENORMIN) 25 MG tablet Take 25 mg by mouth daily. 1/2 tab      . cetirizine (ZYRTEC) 10 MG tablet Take 10 mg by mouth daily.        Marland Kitchen ezetimibe (ZETIA) 10 MG tablet Take 1 tablet (10 mg total) by mouth daily.  90 tablet  0  . fenofibrate micronized (LOFIBRA) 134 MG capsule Take 1 capsule (134 mg total) by mouth daily before breakfast.  90 capsule  0  . NEXIUM 40 MG capsule TAKE 1 CAPSULE EVERY DAY  30 capsule  11  . rizatriptan (MAXALT) 10 MG tablet TAKE 1 TABLET FOR HEADACHE MAY REPEAT IN 2 HOURS. TAKE NO MORE THAN 2 TABLETS IN 24 HOURS  18 tablet  1  . traMADol (ULTRAM) 50 MG tablet TAKE 1 TABLET (50 MG TOTAL) BY MOUTH EVERY 6 (SIX) HOURS AS NEEDED FOR PAIN.  30 tablet  0        Objective:   Physical Exam BP 108/78  Pulse 80  Temp(Src) 98.4 F (36.9 C) (Oral)  Wt 178 lb (80.74 kg)  SpO2 97% WDWN in NAD  quiet respirations; mildly congested  somewhat hoarse. Non toxic . HEENT: Normocephalic ;atraumatic , Eyes;  PERRL, EOMs  Full, lids and conjunctiva clear,,Ears: no deformities, canals nl, TM landmarks normal, Nose: no deformity or discharge but  congested;face minimally tender left more than right  Mouth : OP clear without lesion or edema . Neck: Supple without adenopathy or masses or bruits Chest:  Clear to A&P without wheezes rales or rhonchi CV:  S1-S2 no gallops or murmurs peripheral perfusion is normal Skin :nl perfusion and no acute rashes     Assessment & Plan:  Recurrent ur congestion and infection  recurrent sinusitis possibly chronic.  Presumed underlying allergy. She did not go to their nose and throat doctor because she got better.despite  Advice   We will add nasal cortisone discussed this at length saline washes and antibiotics to see if this helps today. Have her follow up in one to 2 months. Consider Singulair your nose and throat consult and even allergy consult.  Total visit > 50% spent counseling and coordinating care

## 2012-01-11 ENCOUNTER — Ambulatory Visit: Payer: Federal, State, Local not specified - PPO | Admitting: Internal Medicine

## 2012-01-14 ENCOUNTER — Other Ambulatory Visit: Payer: Self-pay | Admitting: Internal Medicine

## 2012-01-14 NOTE — Telephone Encounter (Signed)
Pt last seen 01/07/12. Rx last filled 12/08/11.  Pls advise.

## 2012-01-14 NOTE — Telephone Encounter (Signed)
Can refill x 1 and please put a dx in the problem list  As to why she is taking this  I cant track it I thibk its joint pain but not sure. thanks

## 2012-01-21 ENCOUNTER — Other Ambulatory Visit: Payer: Self-pay | Admitting: Internal Medicine

## 2012-01-24 ENCOUNTER — Telehealth: Payer: Self-pay | Admitting: Internal Medicine

## 2012-01-24 MED ORDER — FENOFIBRATE MICRONIZED 134 MG PO CAPS: 134.0000 mg | ORAL_CAPSULE | Freq: Every day | ORAL | Status: DC

## 2012-01-24 NOTE — Telephone Encounter (Signed)
Pt called and has been out of fenofibrate micronized (LOFIBRA) 134 MG capsule for 4 days. [facility]. Pt said that [facility] is suppose to be sending a req for 90 day supply on all meds, which will need to be sent in as soon as its rcvd.

## 2012-01-24 NOTE — Telephone Encounter (Signed)
Rx sent to [facility] [address]

## 2012-01-25 ENCOUNTER — Telehealth: Payer: Self-pay | Admitting: Internal Medicine

## 2012-01-25 MED ORDER — EZETIMIBE 10 MG PO TABS: 10.0000 mg | ORAL_TABLET | Freq: Every day | ORAL | Status: DC

## 2012-01-25 NOTE — Telephone Encounter (Signed)
[facility] sent to [facility] [address]

## 2012-01-25 NOTE — Telephone Encounter (Signed)
Pt called and is needing refill of ezetimibe (ZETIA) 10 MG tablet to [facility]. Pt also need a 90 day supply sent in to [facility].

## 2012-02-21 ENCOUNTER — Ambulatory Visit (INDEPENDENT_AMBULATORY_CARE_PROVIDER_SITE_OTHER): Payer: Federal, State, Local not specified - PPO | Admitting: Family Medicine

## 2012-02-21 ENCOUNTER — Encounter: Payer: Self-pay | Admitting: Family Medicine

## 2012-02-21 ENCOUNTER — Telehealth: Payer: Self-pay | Admitting: Internal Medicine

## 2012-02-21 VITALS — BP 132/80 | Temp 98.5°F | Wt 176.0 lb

## 2012-02-21 DIAGNOSIS — M545 Low back pain: Secondary | ICD-10-CM

## 2012-02-21 MED ORDER — METAXALONE 800 MG PO TABS: 800.0000 mg | ORAL_TABLET | Freq: Three times a day (TID) | ORAL | Status: AC

## 2012-02-21 NOTE — Progress Notes (Signed)
  Subjective:    Patient ID: Michele Monroe, female    DOB: 1946-08-03, 66 y.o.   MRN: [id number]  HPI  Low back pain. Started yesterday after bending over picking up laundry. Pain is poorly localized actually lower thoracic and upper lumbar region. She's had some muscle spasm off and on. She tried heat and ibuprofen without much relief. Pain is 6/10 intensity. Better lying on side and worse with position change. No dysuria. No fever or chills. No appetite or weight changes.  Review of Systems  Constitutional: Negative for fever and chills.  Genitourinary: Negative for dysuria.  Neurological: Negative for weakness and numbness.       Objective:   Physical Exam  Constitutional: She appears well-developed and well-nourished.  Cardiovascular: Normal rate and regular rhythm.   Pulmonary/Chest: Effort normal and breath sounds normal. No respiratory distress. She has no wheezes. She has no rales.  Musculoskeletal: She exhibits no edema.       Straight leg raises are negative bilaterally. She has some minimal lower thoracic muscular tenderness bilaterally.  Neurological:       Deep tendon reflexes are 2+ ankle and knee bilaterally. Normal sensory function. Full-strength throughout lower extremities          Assessment & Plan:  Low back pain. Suspect muscular. Skelaxin 800 mg 3 times a day with food. Continue over-the-counter ibuprofen. Stretches given. Followup with primary in 2 weeks if no better

## 2012-02-21 NOTE — Patient Instructions (Addendum)
Back Pain, Adult Low back pain is very common. About 1 in 5 people have back pain.The cause of low back pain is rarely dangerous. The pain often gets better over time.About half of people with a sudden onset of back pain feel better in just 2 weeks. About 8 in 10 people feel better by 6 weeks.  CAUSES Some common causes of back pain include:  Strain of the muscles or ligaments supporting the spine.   Wear and tear (degeneration) of the spinal discs.   Arthritis.   Direct injury to the back.  DIAGNOSIS Most of the time, the direct cause of low back pain is not known.However, back pain can be treated effectively even when the exact cause of the pain is unknown.Answering your caregiver's questions about your overall health and symptoms is one of the most accurate ways to make sure the cause of your pain is not dangerous. If your caregiver needs more information, he or she may order lab work or imaging tests (X-rays or MRIs).However, even if imaging tests show changes in your back, this usually does not require surgery. HOME CARE INSTRUCTIONS For many people, back pain returns.Since low back pain is rarely dangerous, it is often a condition that people can learn to manageon their own.   Remain active. It is stressful on the back to sit or stand in one place. Do not sit, drive, or stand in one place for more than 30 minutes at a time. Take short walks on level surfaces as soon as pain allows.Try to increase the length of time you walk each day.   Do not stay in bed.Resting more than 1 or 2 days can delay your recovery.   Do not avoid exercise or work.Your body is made to move.It is not dangerous to be active, even though your back may hurt.Your back will likely heal faster if you return to being active before your pain is gone.   Pay attention to your body when you bend and lift. Many people have less discomfortwhen lifting if they bend their knees, keep the load close to their  bodies,and avoid twisting. Often, the most comfortable positions are those that put less stress on your recovering back.   Find a comfortable position to sleep. Use a firm mattress and lie on your side with your knees slightly bent. If you lie on your back, put a pillow under your knees.   Only take over-the-counter or prescription medicines as directed by your caregiver. Over-the-counter medicines to reduce pain and inflammation are often the most helpful.Your caregiver may prescribe muscle relaxant drugs.These medicines help dull your pain so you can more quickly return to your normal activities and healthy exercise.   Put ice on the injured area.   Put ice in a plastic bag.   Place a towel between your skin and the bag.   Leave the ice on for 15 to 20 minutes, 3 to 4 times a day for the first 2 to 3 days. After that, ice and heat may be alternated to reduce pain and spasms.   Ask your caregiver about trying back exercises and gentle massage. This may be of some benefit.   Avoid feeling anxious or stressed.Stress increases muscle tension and can worsen back pain.It is important to recognize when you are anxious or stressed and learn ways to manage it.Exercise is a great option.  SEEK MEDICAL CARE IF:  You have pain that is not relieved with rest or medicine.   You have   pain that does not improve in 1 week.   You have new symptoms.   You are generally not feeling well.  SEEK IMMEDIATE MEDICAL CARE IF:   You have pain that radiates from your back into your legs.   You develop new bowel or bladder control problems.   You have unusual weakness or numbness in your arms or legs.   You develop nausea or vomiting.   You develop abdominal pain.   You feel faint.  Document Released: 08/23/2005 Document Revised: 08/12/2011 Document Reviewed: 01/11/2011 [employer] Patient Information 2012 [employer]. 

## 2012-02-21 NOTE — Telephone Encounter (Signed)
Caller: Michele Monroe/Patient; PCP: [name].; CB#: [phone]; ; ; Call regarding Back Pain;  Pt bent over yesterday am and her lower back went into spasm. She is calling to request Flexarill.  she can stand up but it hurts to walk. Rn advised appt. Pt scheduled with Dr. Caryl Never at 3:30.

## 2012-03-03 ENCOUNTER — Other Ambulatory Visit: Payer: Self-pay | Admitting: Internal Medicine

## 2012-03-06 ENCOUNTER — Other Ambulatory Visit: Payer: Self-pay | Admitting: Internal Medicine

## 2012-03-07 NOTE — Telephone Encounter (Signed)
Pt last here on 02/21/12 and script last filled on 01/18/12.

## 2012-03-08 ENCOUNTER — Ambulatory Visit: Payer: Federal, State, Local not specified - PPO | Admitting: Internal Medicine

## 2012-03-08 NOTE — Telephone Encounter (Signed)
Pt is going out of town please call med in [place] today

## 2012-03-08 NOTE — Telephone Encounter (Signed)
Ok to refill x 1  

## 2012-03-10 ENCOUNTER — Other Ambulatory Visit: Payer: Self-pay | Admitting: Family Medicine

## 2012-03-10 MED ORDER — TRAMADOL HCL 50 MG PO TABS: 50.0000 mg | ORAL_TABLET | Freq: Four times a day (QID) | ORAL | Status: DC | PRN

## 2012-03-10 NOTE — Telephone Encounter (Signed)
Already answered this and said can call in a refill  X 1  Please do this.

## 2012-03-10 NOTE — Telephone Encounter (Signed)
Last seen 11-17-12 f/u visit. No future appt scheduled.  Please advise.

## 2012-04-26 ENCOUNTER — Other Ambulatory Visit: Payer: Self-pay | Admitting: Internal Medicine

## 2012-04-28 ENCOUNTER — Other Ambulatory Visit: Payer: Self-pay | Admitting: Family Medicine

## 2012-04-28 MED ORDER — TRAMADOL HCL 50 MG PO TABS: 50.0000 mg | ORAL_TABLET | Freq: Four times a day (QID) | ORAL | Status: DC | PRN

## 2012-04-28 NOTE — Telephone Encounter (Signed)
Sent to the pharmacy by e-scribe. 

## 2012-04-28 NOTE — Telephone Encounter (Signed)
Last seen in fu visit on 03/08/12.  Has CPE on 08/07/12.  Tramadol last filled 03/10/12 #[facility] Rizatriptan last filled 11/20/11 #18 1 refill  Please advise.  Thanks!!!

## 2012-04-28 NOTE — Telephone Encounter (Signed)
Refill 30 tramadol x 1  maxalt  Dip 18  No refill

## 2012-05-16 ENCOUNTER — Telehealth: Payer: Self-pay | Admitting: Internal Medicine

## 2012-05-16 MED ORDER — ATENOLOL 25 MG PO TABS: 25.0000 mg | ORAL_TABLET | Freq: Every day | ORAL | Status: DC

## 2012-05-16 MED ORDER — FENOFIBRATE MICRONIZED 134 MG PO CAPS: 134.0000 mg | ORAL_CAPSULE | Freq: Every day | ORAL | Status: DC

## 2012-05-16 MED ORDER — CETIRIZINE HCL 10 MG PO TABS: 10.0000 mg | ORAL_TABLET | Freq: Every day | ORAL | Status: DC

## 2012-05-16 MED ORDER — EZETIMIBE 10 MG PO TABS: 10.0000 mg | ORAL_TABLET | Freq: Every day | ORAL | Status: DC

## 2012-05-16 MED ORDER — RIZATRIPTAN BENZOATE 10 MG PO TABS: 10.0000 mg | ORAL_TABLET | ORAL | Status: DC | PRN

## 2012-05-16 MED ORDER — ESOMEPRAZOLE MAGNESIUM 40 MG PO CPDR: 40.0000 mg | DELAYED_RELEASE_CAPSULE | Freq: Every day | ORAL | Status: DC

## 2012-05-16 MED ORDER — MOMETASONE FUROATE 50 MCG/ACT NA SUSP: 2.0000 | Freq: Every day | NASAL | Status: DC

## 2012-05-16 NOTE — Telephone Encounter (Signed)
Pt called and has changed pharmacies to [facility] and is needing a refill on all meds. Req to get a 90 day supply on all of them. atenolol (TENORMIN) 25 MG tablet ,ezetimibe (ZETIA) 10 MG tablet , fenofibrate micronized (LOFIBRA) 134 MG capsule,  mometasone (NASONEX) 50 MCG/ACT nasal spray, NEXIUM 40 MG capsule,  rizatriptan (MAXALT) 10 MG tablet,  traMADol (ULTRAM) 50 MG tablet,  cetirizine (ZYRTEC) 10 MG tablet     Pt also has been having to take Miralax in order to go to bathroom. If she doesn't use it, there is some blood on the tissue. Wanted to know if she needs to sch colonoscopy?

## 2012-05-16 NOTE — Telephone Encounter (Signed)
All medications sent except for the Tramadol.  Pt notified could not send this.  Please see below message and answer about colonoscopy.  Thanks!!

## 2012-05-17 NOTE — Telephone Encounter (Signed)
Constipation can be caused by medication and can cause some small amount of bleeding   . Would advise she make appt and  get advice from her Gi doctor if continuing   .

## 2012-05-18 NOTE — Telephone Encounter (Signed)
Pt notified by telephone. 

## 2012-05-24 ENCOUNTER — Telehealth: Payer: Self-pay | Admitting: Family Medicine

## 2012-05-24 ENCOUNTER — Encounter: Payer: Self-pay | Admitting: Internal Medicine

## 2012-07-10 ENCOUNTER — Other Ambulatory Visit: Payer: Self-pay | Admitting: Internal Medicine

## 2012-07-10 DIAGNOSIS — Z1231 Encounter for screening mammogram for malignant neoplasm of breast: Secondary | ICD-10-CM

## 2012-07-24 ENCOUNTER — Telehealth: Payer: Self-pay | Admitting: Internal Medicine

## 2012-07-24 NOTE — Telephone Encounter (Signed)
Patient states she has been having abdominal pain middle of abdomen, above the belly button.  She states she is not having any constipation or diarrhea at this point. She states she has a history of IBS. Patient would like to be seen for abdominal pain and to discuss IBS. Scheduled patient with Mike Gip, PA on 07/25/12 at 2:30PM.

## 2012-07-25 ENCOUNTER — Ambulatory Visit (INDEPENDENT_AMBULATORY_CARE_PROVIDER_SITE_OTHER): Payer: Federal, State, Local not specified - PPO | Admitting: Physician Assistant

## 2012-07-25 ENCOUNTER — Encounter: Payer: Self-pay | Admitting: Physician Assistant

## 2012-07-25 VITALS — BP 100/68 | HR 74 | Ht 65.0 in | Wt 182.2 lb

## 2012-07-25 DIAGNOSIS — R141 Gas pain: Secondary | ICD-10-CM

## 2012-07-25 DIAGNOSIS — R143 Flatulence: Secondary | ICD-10-CM

## 2012-07-25 DIAGNOSIS — R109 Unspecified abdominal pain: Secondary | ICD-10-CM

## 2012-07-25 DIAGNOSIS — K589 Irritable bowel syndrome without diarrhea: Secondary | ICD-10-CM

## 2012-07-25 DIAGNOSIS — K5900 Constipation, unspecified: Secondary | ICD-10-CM

## 2012-07-25 DIAGNOSIS — R140 Abdominal distension (gaseous): Secondary | ICD-10-CM

## 2012-07-25 MED ORDER — RIFAXIMIN 550 MG PO TABS: 550.0000 mg | ORAL_TABLET | Freq: Two times a day (BID) | ORAL | Status: AC

## 2012-07-25 MED ORDER — DICYCLOMINE HCL 10 MG PO CAPS: ORAL_CAPSULE | ORAL | Status: DC

## 2012-07-25 MED ORDER — RIFAXIMIN 550 MG PO TABS: 550.0000 mg | ORAL_TABLET | Freq: Two times a day (BID) | ORAL | Status: DC

## 2012-07-25 MED ORDER — ALIGN PO CAPS: 1.0000 | ORAL_CAPSULE | Freq: Every day | ORAL | Status: DC

## 2012-07-25 NOTE — Patient Instructions (Addendum)
Take Miralax daily, 1 dose  In 8 oz of water or juice. We have given you samples of Align a probiotic, take 1 daily. Coupon provided also. We gave you a gas and Flatulence diet brochure. We sent a prescription for the Bentyl and 7 days worth of Xifaxan to [facility] [address]  We made you an appointment with Dr. Juanda Chance on 09-13-2011 at 3:30 PM.

## 2012-07-25 NOTE — Progress Notes (Signed)
reviewed and agree. Hx of colon cancer in mother age 66. Recall colon 10/2014

## 2012-07-25 NOTE — Progress Notes (Signed)
07/25/2012 Michele Monroe [id number] 1946/01/13   History of Present Illness:  Patient presents to the office today with complaints of diffuse abdominal cramping, gas, bloating, and constipation.  She has a history of IBS with constipation as her primary complaint in the past.  She says that she is also lactose intolerant and tries to avoid lactose containing products for the most part.  For the last couple of months she has experienced significant gas and bloating with almost everything that she eats.  Particular foods that have bothered her are cabbage and fettuccine sauce.  She has taken both metamucil and miralax in the past for her constipation, and the miralax works well for her, but she has not been taking it regularly recently.  Her abdominal cramping, gas, and bloating seem to improve if she has a bowel movement.  Has pebble-like stools with straining since not taking miralax.  Symptoms do not wake her from sleep at night.  If she does not eat then she does not have these symptoms.  Has not used any other medications for her IBS such as antispasmodics.  She is asking about trial of xifaxan and questioning what type of probiotic she should could try.  Had colonoscopy in 10/2009 at which time it was normal except for internal hemorrhoids.   Current Medications, Allergies, Past Medical History, Past Surgical History, Family History and Social History were reviewed in [employer] record.   Physical Exam: General: Well developed ,white female in no acute distress Head: Normocephalic and atraumatic Eyes:  sclerae anicteric, conjunctiva pink  Ears: Normal auditory acuity. Lungs: Clear throughout to auscultation Heart: Regular rate and rhythm Abdomen: Soft, non-distended.  BS are normal.  Mild diffuse TTP without R/R/G.  No masses, no hepatomegaly. Rectal: Deferred. Musculoskeletal: Symmetrical with no gross deformities  Extremities: No edema  Neurological: Alert oriented  x 4, grossly nonfocal Psychological:  Alert and cooperative. Normal mood and affect  Assessment and Recommendations: 1)  Patient's abdominal cramping, bloating, gas and constipation sounds very suspicious for her IBS.   -Will give trial of xifaxan 550 mg BID for ten days (samples given).  Then begin daily probiotic (align recommended and samples given).  She also needs to begin taking her Miralax daily since that helps with her constipation and relief to her constipation seems to help her other symptoms.  Information given to follow a low gas/flatulence diet.  Prescription for bentyl 10 mg BID prn for significant abdominal cramping.  Needs to continue avoiding lactose containing products as she has been.  She will follow up with Dr. Juanda Chance in about 2 months.

## 2012-07-26 ENCOUNTER — Other Ambulatory Visit: Payer: Self-pay | Admitting: Internal Medicine

## 2012-07-26 ENCOUNTER — Telehealth: Payer: Self-pay | Admitting: Internal Medicine

## 2012-07-26 NOTE — Telephone Encounter (Signed)
Spoke to patient. The Xifaxan will be $100. She was told to call back if the medication was too expensive. We do not have enough Xifaxan to give her 7 more days worth of samples. I have also spoken with Mike Gip, PA-C who states that 6 more tablets should be enough to treat patient. I have advised patient and she will come pick up the additional tablets.

## 2012-07-28 NOTE — Telephone Encounter (Signed)
Error

## 2012-07-30 ENCOUNTER — Other Ambulatory Visit: Payer: Self-pay | Admitting: Internal Medicine

## 2012-08-01 ENCOUNTER — Other Ambulatory Visit (INDEPENDENT_AMBULATORY_CARE_PROVIDER_SITE_OTHER): Payer: Federal, State, Local not specified - PPO

## 2012-08-01 DIAGNOSIS — Z0000 Encounter for general adult medical examination without abnormal findings: Secondary | ICD-10-CM

## 2012-08-01 LAB — LIPID PANEL
Cholesterol: 142 mg/dL (ref 0–200)
HDL: 58 mg/dL (ref >39.00)
LDL Cholesterol: 69 mg/dL (ref 0–99)
Total CHOL/HDL Ratio: 2
Triglycerides: 76 mg/dL (ref 0.0–149.0)
VLDL: 15.2 mg/dL (ref 0.0–40.0)

## 2012-08-01 LAB — HEPATIC FUNCTION PANEL
ALT: 23 U/L (ref 0–35)
AST: 26 U/L (ref 0–37)
Total Bilirubin: 0.4 mg/dL (ref 0.3–1.2)
Total Protein: 7.2 g/dL (ref 6.0–8.3)

## 2012-08-01 LAB — CBC WITH DIFFERENTIAL/PLATELET
Basophils Absolute: 0.1 10*3/uL (ref 0.0–0.1)
Eosinophils Absolute: 0.3 10*3/uL (ref 0.0–0.7)
Hemoglobin: 11.9 g/dL — ABNORMAL LOW (ref 12.0–15.0)
Lymphocytes Relative: 37.2 % (ref 12.0–46.0)
MCHC: 33.1 g/dL (ref 30.0–36.0)
Monocytes Relative: 7.3 % (ref 3.0–12.0)
Neutrophils Relative %: 49.3 % (ref 43.0–77.0)
RDW: 12.6 % (ref 11.5–14.6)

## 2012-08-01 LAB — POCT URINALYSIS DIPSTICK
Blood, UA: NEGATIVE
Glucose, UA: NEGATIVE
Nitrite, UA: NEGATIVE
Spec Grav, UA: 1.02
Urobilinogen, UA: 0.2
pH, UA: 7.5

## 2012-08-01 LAB — BASIC METABOLIC PANEL
CO2: 27 mEq/L (ref 19–32)
Chloride: 106 mEq/L (ref 96–112)
Potassium: 3.5 mEq/L (ref 3.5–5.1)
Sodium: 139 mEq/L (ref 135–145)

## 2012-08-07 ENCOUNTER — Ambulatory Visit (INDEPENDENT_AMBULATORY_CARE_PROVIDER_SITE_OTHER): Payer: Federal, State, Local not specified - PPO | Admitting: Internal Medicine

## 2012-08-07 ENCOUNTER — Encounter: Payer: Self-pay | Admitting: Internal Medicine

## 2012-08-07 ENCOUNTER — Other Ambulatory Visit (HOSPITAL_COMMUNITY)
Admission: RE | Admit: 2012-08-07 | Discharge: 2012-08-07 | Disposition: A | Discharge status: Home / Self Care | Payer: Federal, State, Local not specified - PPO | Source: Ambulatory Visit | Attending: Internal Medicine | Admitting: Internal Medicine

## 2012-08-07 VITALS — BP 124/74 | HR 77 | Temp 98.7°F | Ht 65.25 in | Wt 175.0 lb

## 2012-08-07 DIAGNOSIS — J019 Acute sinusitis, unspecified: Secondary | ICD-10-CM

## 2012-08-07 DIAGNOSIS — J398 Other specified diseases of upper respiratory tract: Secondary | ICD-10-CM

## 2012-08-07 DIAGNOSIS — F4541 Pain disorder exclusively related to psychological factors: Secondary | ICD-10-CM

## 2012-08-07 DIAGNOSIS — J0191 Acute recurrent sinusitis, unspecified: Secondary | ICD-10-CM

## 2012-08-07 DIAGNOSIS — M542 Cervicalgia: Secondary | ICD-10-CM

## 2012-08-07 DIAGNOSIS — G44209 Tension-type headache, unspecified, not intractable: Secondary | ICD-10-CM

## 2012-08-07 DIAGNOSIS — E785 Hyperlipidemia, unspecified: Secondary | ICD-10-CM | POA: Insufficient documentation

## 2012-08-07 DIAGNOSIS — R042 Hemoptysis: Secondary | ICD-10-CM

## 2012-08-07 DIAGNOSIS — K589 Irritable bowel syndrome without diarrhea: Secondary | ICD-10-CM

## 2012-08-07 DIAGNOSIS — R6889 Other general symptoms and signs: Secondary | ICD-10-CM

## 2012-08-07 DIAGNOSIS — I8390 Asymptomatic varicose veins of unspecified lower extremity: Secondary | ICD-10-CM

## 2012-08-07 DIAGNOSIS — Z23 Encounter for immunization: Secondary | ICD-10-CM

## 2012-08-07 DIAGNOSIS — D649 Anemia, unspecified: Secondary | ICD-10-CM

## 2012-08-07 DIAGNOSIS — Z0000 Encounter for general adult medical examination without abnormal findings: Secondary | ICD-10-CM

## 2012-08-07 MED ORDER — AMOXICILLIN-POT CLAVULANATE 875-125 MG PO TABS: 1.0000 | ORAL_TABLET | Freq: Two times a day (BID) | ORAL | Status: DC

## 2012-08-07 NOTE — Progress Notes (Signed)
Chief Complaint  Patient presents with  . Annual Exam    mutiple concerns    HPI: Patient comes in today for Preventive Health Care visit  Also has a number of concerns:   COUGHED UP SOME BLoody  phlegm or mucous this am.  Has this with her tissue no sob cp has nose and sinus congestion.  Headaches   Uses  ibuprofen from [facility].   Taking tramadol 2-3 per day for shoulder and back. Pain oing  Eulah Pont saw  Not a surgical problem .  Months.  May add to  neck   Taking otc  for sinus.  Has varicose veins  ROS:  GEN/ HEENT: No fever, significant weight changes sweats  vision problems hearing changes, CV/ PULM; No chest pain shortness of breath, syncope,edema  change in exercise tolerance. GI /GU: No adominal pain, vomiting, change in bowel habits. No blood in the stool. No significant GU symptoms. Has bs at times  SKIN/HEME: ,no acute skin rashes suspicious lesions or bleeding. No lymphadenopathy, nodules, masses.  NEURO/ PSYCH:  No neurologic signs such as weakness numbness. No depression anxiety. IMM/ Allergy: No unusual infections.  Allergy .   REST of 12 system review negative except as per HPI   Hearing: ok  Vision:  No limitations at present . Last eye check UTD  Safety:  Has smoke detector and wears seat belts.  No firearms. No excess sun exposure. Sees dentist regularly.  Falls: no  Advance directive :  Reviewed  .  Memory: Felt to be good  , no concern from her or her family.  Depression: No anhedonia unusual crying or depressive symptoms  Nutrition: Eats well balanced diet; adequate calcium and vitamin D. No swallowing chewing problems.  Injury: no major injuries in the last six months.  Other healthcare providers:  Reviewed today .  Social: hh of 1  Preventive parameters: up-to-date  Reviewed  Due for pneumovax  ADLS:   There are no problems or need for assistance  driving, feeding, obtaining food, dressing, toileting and bathing, managing money using phone. She  is independent.  EXERCISE/ HABITS  Per week   No tobacco    no etoh    Past Medical History  Diagnosis Date  . VITAMIN D DEFICIENCY 06/30/2010  . HYPERLIPIDEMIA 06/30/2010  . HYPERTENSION 06/30/2010  . ALLERGIC RHINITIS 06/30/2010    perennial  . GERD 06/30/2010  . IBS 06/30/2010  . Headache 06/30/2010    migraines  . Internal hemorrhoids     Family History  Problem Relation Age of Onset  . Colon cancer Mother     age 70  . Arthritis Brother   . Other Brother     History   Social History  . Marital Status: Widowed    Spouse Name: N/A    Number of Children: N/A  . Years of Education: N/A   Social History Main Topics  . Smoking status: Former Smoker    Quit date: 09/06/1976  . Smokeless tobacco: Never Used  . Alcohol Use: No     Comment: rarely  . Drug Use: No  . Sexually Active: None   Other Topics Concern  . 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    Health Maintenance  Topic Date Due  . Zostavax  06/08/2007  .  Pneumococcal Polysaccharide Vaccine Age 49 And Over  06/07/2012  . Influenza Vaccine  05/07/2013  . Mammogram  08/10/2013  . Tetanus/tdap  09/06/2017  . Colonoscopy  10/08/2019  \ Outpatient Prescriptions Prior to Visit  Medication Sig Dispense Refill  . atenolol (TENORMIN) 25 MG tablet Take 12.5 mg by mouth daily.      Marland Kitchen atenolol (TENORMIN) 25 MG tablet TAKE 1 TABLET DAILY  90 tablet  0  . bifidobacterium infantis (ALIGN) capsule Take 1 capsule by mouth daily.  21 capsule  0  . cetirizine (ZYRTEC) 10 MG tablet Take 1 tablet (10 mg total) by mouth daily.  90 tablet  1  . esomeprazole (NEXIUM) 40 MG capsule Take 1 capsule (40 mg total) by mouth daily.  90 capsule  1  . ezetimibe (ZETIA) 10 MG tablet Take 1 tablet (10 mg total) by mouth daily.  90 tablet  1  . fenofibrate micronized (LOFIBRA)  134 MG capsule Take 1 capsule (134 mg total) by mouth daily before breakfast.  90 capsule  1  . mometasone (NASONEX) 50 MCG/ACT nasal spray Place 2 sprays into the nose daily.  51 g  1  . rizatriptan (MAXALT) 10 MG tablet Take 1 tablet (10 mg total) by mouth as needed for migraine. May repeat in 2 hours if needed  54 tablet  0  . traMADol (ULTRAM) 50 MG tablet TAKE 1 TABLET (50 MG TOTAL) BY MOUTH EVERY 6 (SIX) HOURS AS NEEDED FOR PAIN.  30 tablet  0  . [DISCONTINUED] dicyclomine (BENTYL) 10 MG capsule Take 1 tab twice daily as needed for cramping and spasms.  60 capsule  1  Last reviewed on 08/07/2012 12:07 PM by [name], MD    EXAM:  BP 124/74  Pulse 77  Temp 98.7 F (37.1 C) (Oral)  Ht 5' 5.25" (1.657 m)  Wt 175 lb (79.379 kg)  BMI 28.90 kg/m2  SpO2 96%  Body mass index is 28.90 kg/(m^2).  Physical Exam: Vital signs reviewed ZOX:WRUE is a well-developed well-nourished alert cooperative   female who appears her stated age in no acute distress.  Congested  HEENT: normocephalic atraumatic , Eyes: PERRL EOM's full, conjunctiva clear, Nares: paten,t no deformity discharge or tenderness., Ears: no deformity EAC's clear TMs with normal landmarks. Face mildly tender  Mouth: clear OP, no blood  Left tonsil area with 2-3 mm white glistening  Area  edema.  Moist mucous membranes. Dentition in adequate repair. NECK: supple without masses, thyromegaly or bruits. CHEST/PULM:  Clear to auscultation and percussion breath sounds equal no wheeze , rales or rhonchi. No chest wall deformities or tenderness. CV: PMI is nondisplaced, S1 S2 no gallops, murmurs, rubs. Peripheral pulses are full without delay.No JVD .  Breast: normal by inspection . No dimpling, discharge, masses, tenderness or discharge . ABDOMEN: Bowel sounds normal nontender  No guard or rebound, no hepato splenomegal no CVA tenderness.  No hernia. Extremtities:  No clubbing cyanosis or edema, no acute joint swelling or redness no  focal atrophy  Varicose veins noted no ulcerations  NEURO:  Oriented x3, cranial nerves 3-12 appear to be intact, no obvious focal weakness,gait within normal limits no abnormal reflexes or asymmetrical SKIN: No acute rashes normal turgor, color, no bruising or petechiae. PSYCH: Oriented, good eye contact, no obvious depression anxiety, cognition and judgment appear normal. Verbal  Talkative  LN: no cervical axillary inguinal adenopathy  Lab Results  Component Value Date   WBC 6.0 08/01/2012   HGB 11.9*  08/01/2012   HCT 36.0 08/01/2012   PLT 268.0 08/01/2012   GLUCOSE 97 08/01/2012   CHOL 142 08/01/2012   TRIG 76.0 08/01/2012   HDL 58.00 08/01/2012   LDLCALC 69 08/01/2012   ALT 23 08/01/2012   AST 26 08/01/2012   NA 139 08/01/2012   K 3.5 08/01/2012   CL 106 08/01/2012   CREATININE 0.9 08/01/2012   BUN 13 08/01/2012   CO2 27 08/01/2012   TSH 1.08 08/01/2012    ASSESSMENT AND PLAN:  Discussed the following assessment and plan:  1. Visit for preventive health examination    2. HYPERLIPIDEMIA    3. Need for pneumococcal vaccination  Pneumococcal conjugate vaccine 13-valent less than 5yo IM  4. Coughing up blood  Cytology - non pap, Ambulatory referral to ENT   piece of tissue? will send to path poss from upper airway sinusitis  5. Abnormal ENT evaluation  Ambulatory referral to ENT   wihte area left tonsillar area.   6. Stress headaches  Ambulatory referral to Physical Therapy  7. Bilateral neck pain  Ambulatory referral to Physical Therapy  8. IBS    9. Varicose veins     keft had procedure prominent right  10. Mild anemia     borderline hg may be insig  wil repeat nad follow   11. Recurrent acute sinusitis  Ambulatory referral to ENT   becausse of congestion and blood issue antibiotic today empiric.    Preventive Health Care Counseled regarding healthy nutrition, exercise, sleep, injury prevention, calcium vit d and healthy weight .   Patient Instructions    Integrative therapies   May be helpful  With the neck and upper shoulder pain.  (Tramadol can be habit forming and cause other problems.) Will refer   For this.  Will get ent to see you about the sinuses  The blood you coughed up and the white spot in our left tonsil area.  Taking too many decongestants can also cause headaches at times. Treat for a sinus infection today   Your labs are good except borderline  Hg but this may be insignificant  Plan recheck cbcdiff and iron studies IBC ferritin in 1-2 months and then fu visit .     Neta Mends. Panosh M.D.  Prolonged visits   Bloody tissue was noted and send for path   Results shows" abscess"  . Pt was placed on augmentin  At this visit.

## 2012-08-07 NOTE — Patient Instructions (Addendum)
Integrative therapies   May be helpful  With the neck and upper shoulder pain.  (Tramadol can be habit forming and cause other problems.) Will refer   For this.  Will get ent to see you about the sinuses  The blood you coughed up and the white spot in our left tonsil area.  Taking too many decongestants can also cause headaches at times. Treat for a sinus infection today   Your labs are good except borderline  Hg but this may be insignificant  Plan recheck cbcdiff and iron studies IBC ferritin in 1-2 months and then fu visit .

## 2012-08-11 ENCOUNTER — Encounter: Payer: Self-pay | Admitting: *Deleted

## 2012-08-13 ENCOUNTER — Encounter: Payer: Self-pay | Admitting: Internal Medicine

## 2012-08-13 DIAGNOSIS — M542 Cervicalgia: Secondary | ICD-10-CM | POA: Insufficient documentation

## 2012-08-13 DIAGNOSIS — R042 Hemoptysis: Secondary | ICD-10-CM

## 2012-08-13 DIAGNOSIS — F4541 Pain disorder exclusively related to psychological factors: Secondary | ICD-10-CM | POA: Insufficient documentation

## 2012-08-13 DIAGNOSIS — R6889 Other general symptoms and signs: Secondary | ICD-10-CM | POA: Insufficient documentation

## 2012-08-13 DIAGNOSIS — J0191 Acute recurrent sinusitis, unspecified: Secondary | ICD-10-CM | POA: Insufficient documentation

## 2012-08-13 DIAGNOSIS — I8390 Asymptomatic varicose veins of unspecified lower extremity: Secondary | ICD-10-CM | POA: Insufficient documentation

## 2012-08-13 DIAGNOSIS — D649 Anemia, unspecified: Secondary | ICD-10-CM | POA: Insufficient documentation

## 2012-08-13 HISTORY — DX: Hemoptysis: R04.2

## 2012-08-18 ENCOUNTER — Ambulatory Visit (HOSPITAL_COMMUNITY)
Admission: RE | Admit: 2012-08-18 | Discharge: 2012-08-18 | Disposition: A | Discharge status: Home / Self Care | Payer: Federal, State, Local not specified - PPO | Source: Ambulatory Visit | Attending: Internal Medicine | Admitting: Internal Medicine

## 2012-08-18 DIAGNOSIS — Z1231 Encounter for screening mammogram for malignant neoplasm of breast: Secondary | ICD-10-CM | POA: Insufficient documentation

## 2012-09-12 ENCOUNTER — Ambulatory Visit (INDEPENDENT_AMBULATORY_CARE_PROVIDER_SITE_OTHER): Payer: Federal, State, Local not specified - PPO | Admitting: Internal Medicine

## 2012-09-12 ENCOUNTER — Encounter: Payer: Self-pay | Admitting: Internal Medicine

## 2012-09-12 VITALS — BP 110/70 | HR 70 | Ht 65.0 in | Wt 181.0 lb

## 2012-09-12 DIAGNOSIS — K589 Irritable bowel syndrome without diarrhea: Secondary | ICD-10-CM

## 2012-09-12 MED ORDER — ALIGN 4 MG PO CAPS: 1.0000 | ORAL_CAPSULE | Freq: Every day | ORAL | Status: DC

## 2012-09-12 MED ORDER — DICYCLOMINE HCL 10 MG PO CAPS: 10.0000 mg | ORAL_CAPSULE | Freq: Four times a day (QID) | ORAL | Status: DC | PRN

## 2012-09-12 NOTE — Patient Instructions (Addendum)
We have sent the following medications to your pharmacy for you to pick up at your convenience: Bentyl  We have given you samples of Align. This puts good bacteria back into your colon. You should take 1 capsule by mouth once daily. If this works well for you, it can be purchased over the counter.  CC: Dr Berniece Andreas

## 2012-09-12 NOTE — Progress Notes (Signed)
Michele Monroe 02-21-1946 MRN [id number]   History of Present Illness:  This is a 67 year old white female with irritable bowel syndrome and abdominal pain. Her last office visit was with Michele Gip, PA-C in November 2013 and she treated with probiotics, Miralax, and samples of Xifaxan, total of about 8 pills. She is much improved although she still has some bloating and irregular bowel habits. Her last colonoscopy in February 2011 was normal except for hemorrhoids. There is a positive family history of colon cancer in her mother. She denies rectal bleeding.   Past Medical History  Diagnosis Date  . VITAMIN D DEFICIENCY 06/30/2010  . HYPERLIPIDEMIA 06/30/2010  . HYPERTENSION 06/30/2010  . ALLERGIC RHINITIS 06/30/2010    perennial  . GERD 06/30/2010  . IBS 06/30/2010  . Headache 06/30/2010    migraines  . Internal hemorrhoids    Past Surgical History  Procedure Date  . Rotator cuff repair     Rt  . Breast surgery     needle bx left breast  . Feet     surgery  . Varicose veins     reports that she quit smoking about 36 years ago. She has never used smokeless tobacco. She reports that she does not drink alcohol or use illicit drugs. family history includes Arthritis in her brother; Colon cancer in her mother; and Other in her brother. No Known Allergies      Review of Systems: Denies nausea or vomiting weight loss  The remainder of the 10 point ROS is negative except as outlined in H&P   Physical Exam: General appearance  Well developed, in no distress. Eyes- non icteric. HEENT nontraumatic, normocephalic. Mouth no lesions, tongue papillated, no cheilosis. Neck supple without adenopathy, thyroid not enlarged, no carotid bruits, no JVD. Lungs Clear to auscultation bilaterally. Cor normal S1, normal S2, regular rhythm, no murmur,  quiet precordium. Abdomen: Normoactive bowel sounds. Mild diffuse tenderness. No rebound. No fullness. Rectal: Soft Hemoccult negative stool.  Small external hemorrhoidal tag Extremities no pedal edema. Skin no lesions. Neurological alert and oriented x 3. Psychological normal mood and affect.  Assessment and Plan:  Problem #1 Irritable bowel syndrome with predominant constipation now improved after a course of probiotics and Xifaxin. She is up-to-date on her colonoscopy. She will continue the same regimen and add Bentyl 10 mg when necessary crampy abdominal pain. I have instructed her in a high fiber diet.  Problem #1 Positive family history of colon cancer in her mother. A recall colonoscopy is planned for February 2016.   09/12/2012 Michele Monroe

## 2012-09-13 ENCOUNTER — Other Ambulatory Visit: Payer: Self-pay | Admitting: Internal Medicine

## 2012-09-14 ENCOUNTER — Other Ambulatory Visit: Payer: Self-pay | Admitting: Internal Medicine

## 2012-09-15 MED ORDER — TRAMADOL HCL 50 MG PO TABS: ORAL_TABLET | ORAL | Status: DC

## 2012-09-15 MED ORDER — RIZATRIPTAN BENZOATE 10 MG PO TABS: 10.0000 mg | ORAL_TABLET | ORAL | Status: DC | PRN

## 2012-09-20 ENCOUNTER — Encounter: Payer: Self-pay | Admitting: Internal Medicine

## 2012-09-20 ENCOUNTER — Telehealth: Payer: Self-pay | Admitting: Internal Medicine

## 2012-09-20 NOTE — Telephone Encounter (Signed)
Pt notified to pick up rx at the front desk.

## 2012-09-20 NOTE — Telephone Encounter (Signed)
Pt will be in tomorrow for Labs and is requesting to pick up an rx for the shingles shot and a pneumonia shot so she can have it done at he pharmacy

## 2012-09-21 ENCOUNTER — Other Ambulatory Visit (INDEPENDENT_AMBULATORY_CARE_PROVIDER_SITE_OTHER): Payer: Federal, State, Local not specified - PPO

## 2012-09-21 DIAGNOSIS — D518 Other vitamin B12 deficiency anemias: Secondary | ICD-10-CM

## 2012-09-21 DIAGNOSIS — D519 Vitamin B12 deficiency anemia, unspecified: Secondary | ICD-10-CM

## 2012-09-21 LAB — CBC WITH DIFFERENTIAL/PLATELET
Basophils Relative: 0.8 % (ref 0.0–3.0)
Eosinophils Absolute: 0.4 10*3/uL (ref 0.0–0.7)
Eosinophils Relative: 5.2 % — ABNORMAL HIGH (ref 0.0–5.0)
Hemoglobin: 12.1 g/dL (ref 12.0–15.0)
Lymphocytes Relative: 39.3 % (ref 12.0–46.0)
MCHC: 33.5 g/dL (ref 30.0–36.0)
MCV: 83.8 fl (ref 78.0–100.0)
Neutro Abs: 3.2 10*3/uL (ref 1.4–7.7)
Neutrophils Relative %: 47.1 % (ref 43.0–77.0)
RBC: 4.33 Mil/uL (ref 3.87–5.11)
WBC: 6.9 10*3/uL (ref 4.5–10.5)

## 2012-09-21 LAB — IBC PANEL
Iron: 93 ug/dL (ref 42–145)
Saturation Ratios: 23.8 % (ref 20.0–50.0)
Transferrin: 278.9 mg/dL (ref 212.0–360.0)

## 2012-09-21 LAB — FOLATE: Folate: >24.8 ng/mL (ref >5.9)

## 2012-09-21 LAB — VITAMIN B12: Vitamin B-12: 565 pg/mL (ref 211–911)

## 2012-09-21 LAB — FERRITIN: Ferritin: 60.4 ng/mL (ref 10.0–291.0)

## 2012-09-25 ENCOUNTER — Encounter: Payer: Self-pay | Admitting: Internal Medicine

## 2012-09-26 ENCOUNTER — Encounter: Payer: Self-pay | Admitting: Internal Medicine

## 2012-09-27 ENCOUNTER — Encounter: Payer: Self-pay | Admitting: Internal Medicine

## 2012-09-29 ENCOUNTER — Encounter: Payer: Self-pay | Admitting: Internal Medicine

## 2012-10-02 ENCOUNTER — Encounter: Payer: Self-pay | Admitting: Internal Medicine

## 2012-10-09 ENCOUNTER — Ambulatory Visit (INDEPENDENT_AMBULATORY_CARE_PROVIDER_SITE_OTHER): Payer: Federal, State, Local not specified - PPO | Admitting: Internal Medicine

## 2012-10-09 ENCOUNTER — Encounter: Payer: Self-pay | Admitting: Internal Medicine

## 2012-10-09 VITALS — BP 126/76 | HR 79 | Temp 98.3°F | Wt 181.0 lb

## 2012-10-09 DIAGNOSIS — M542 Cervicalgia: Secondary | ICD-10-CM

## 2012-10-09 DIAGNOSIS — R042 Hemoptysis: Secondary | ICD-10-CM

## 2012-10-09 DIAGNOSIS — D649 Anemia, unspecified: Secondary | ICD-10-CM

## 2012-10-09 DIAGNOSIS — J019 Acute sinusitis, unspecified: Secondary | ICD-10-CM

## 2012-10-09 DIAGNOSIS — J0191 Acute recurrent sinusitis, unspecified: Secondary | ICD-10-CM

## 2012-10-09 DIAGNOSIS — K589 Irritable bowel syndrome without diarrhea: Secondary | ICD-10-CM

## 2012-10-09 NOTE — Patient Instructions (Signed)
I want you to go ahead and get the PT  Evaluation  and treatment prefer integrative therapies.    Doesn't seem like fibromyalgia   . If not getting better   We can get   A rheumatology evaluation.   Contact dr Delia Chimes office about IBS medications.    Take the align in the meantime.    ROV    In about 6 months or as needed.

## 2012-10-09 NOTE — Progress Notes (Signed)
Chief Complaint  Patient presents with  . Follow-up    HPI: Patient comes in today for follow up of  multiple medical problems.  Upper neck  trapezius still hasn't gotten to pt yet  No numbness asked about Fm  But no other  Joint areas . Taking tramadol bid to none   .    Helps opt a lot of se . Labs pending Saw ent  And was told better and to go back if sinus infection recurs.  Is better today  ibs  Better after  Antibiotic  ? If needs more  Told to take align ROS: See pertinent positives and negatives per HPI. Going to retire this  Year and go on medicare in the spring   Past Medical History  Diagnosis Date  . VITAMIN D DEFICIENCY 06/30/2010  . HYPERLIPIDEMIA 06/30/2010  . HYPERTENSION 06/30/2010  . ALLERGIC RHINITIS 06/30/2010    perennial  . GERD 06/30/2010  . IBS 06/30/2010  . Headache 06/30/2010    migraines  . Internal hemorrhoids     Family History  Problem Relation Age of Onset  . Colon cancer Mother     age 7  . Arthritis Brother   . Other Brother     History   Social History  . Marital Status: Widowed    Spouse Name: N/A    Number of Children: 2  . Years of Education: N/A   Occupational History  . OFFICE SPECIALIST    Social History Main Topics  . Smoking status: Former Smoker    Quit date: 09/06/1976  . Smokeless tobacco: Never Used  . Alcohol Use: No     Comment: rarely  . Drug Use: No  . Sexually Active: None   Other Topics Concern  . 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     Outpatient Encounter Prescriptions as of 10/09/2012  Medication Sig Dispense Refill  . atenolol (TENORMIN) 25 MG tablet Take 12.5 mg by mouth daily.      Marland Kitchen esomeprazole (NEXIUM) 40 MG capsule Take 1 capsule (40 mg total) by mouth daily.  90 capsule  1  . ezetimibe (ZETIA) 10 MG tablet Take 1 tablet (10  mg total) by mouth daily.  90 tablet  1  . fenofibrate micronized (LOFIBRA) 134 MG capsule Take 1 capsule (134 mg total) by mouth daily before breakfast.  90 capsule  1  . mometasone (NASONEX) 50 MCG/ACT nasal spray Place 2 sprays into the nose daily.  51 g  1  . OVER THE COUNTER MEDICATION VEMMA drink , one drink every AM      . Probiotic Product (ALIGN) 4 MG CAPS Take 1 capsule by mouth daily.  7 capsule  0  . rifaximin (XIFAXAN) 550 MG TABS Take 550 mg by mouth daily.      . rizatriptan (MAXALT) 10 MG tablet Take 1 tablet (10 mg total) by mouth as needed for migraine. May repeat in 2 hours if needed  36 tablet  0  . traMADol (ULTRAM) 50 MG tablet TAKE 1 TABLET (50 MG TOTAL) BY MOUTH EVERY 6 (SIX) HOURS AS NEEDED FOR PAIN.  30 tablet  0  . [DISCONTINUED] dicyclomine (BENTYL) 10 MG capsule Take 1 capsule (10 mg total) by mouth every 6 (six) hours as needed (cramps).  60 capsule  1    EXAM:  BP 126/76  Pulse 79  Temp 98.3 F (36.8 C) (Oral)  Wt 181 lb (82.101 kg)  SpO2 96%  There is no height on file to calculate BMI.  GENERAL: vitals reviewed and listed above, alert, oriented, appears well hydrated and in no acute distress  HEENT: atraumatic, conjunctiva  clear, no obvious abnormalities on inspection of external nose and ears OP : no lesion edema or exudate  NECK: no obvious masses on inspection palpation   Tight trapezius no midline tenderness LUNGS: clear to auscultation bilaterally, no wheezes, rales or rhonchi, good air movement CV: HRRR, no clubbing cyanosis or  peripheral edema nl cap refill   MS: moves all extremities without noticeable focal  abnormality Neuro  Non focal ue dtrs  Present  Goo motor strength  PSYCH: pleasant and cooperative, no obvious depression or anxiety  Lab Results  Component Value Date   WBC 6.9 09/21/2012   HGB 12.1 09/21/2012   HCT 36.2 09/21/2012   PLT 260.0 09/21/2012   GLUCOSE 97 08/01/2012   CHOL 142 08/01/2012   TRIG 76.0 08/01/2012   HDL  58.00 08/01/2012   LDLCALC 69 08/01/2012   ALT 23 08/01/2012   AST 26 08/01/2012   NA 139 08/01/2012   K 3.5 08/01/2012   CL 106 08/01/2012   CREATININE 0.9 08/01/2012   BUN 13 08/01/2012   CO2 27 08/01/2012   TSH 1.08 08/01/2012   Lab Results  Component Value Date   FERRITIN 60.4 09/21/2012   Lab Results  Component Value Date   VITAMINB12 565 09/21/2012    ASSESSMENT AND PLAN:  Discussed the following assessment and plan:  1. Bilateral neck pain    hasn't had pt yet doesnt seem like FM  but postural sx with trapexious spasm  get PT as discussed taking tramadol 0 - 2 x per day  risk bnefit discussed  2. IBS   3. Mild anemia    resolved     4. Recurrent acute sinusitis    better  .    hooked into ent .    to fu if needed    -Patient advised to return or notify health care team  if symptoms worsen or persist or new concerns arise.  Patient Instructions  I want you to go ahead and get the PT  Evaluation  and treatment prefer integrative therapies.    Doesn't seem like fibromyalgia   . If not getting better   We can get   A rheumatology evaluation.   Contact dr Delia Chimes office about IBS medications.    Take the align in the meantime.    ROV    In about 6 months or as needed.    Neta Mends. Panosh M.D.

## 2012-10-10 ENCOUNTER — Other Ambulatory Visit: Payer: Self-pay | Admitting: Internal Medicine

## 2012-10-10 NOTE — Telephone Encounter (Signed)
Dr Juanda Chance- Do you want me to refill Xifaxan for patient's IBS?

## 2012-10-10 NOTE — Telephone Encounter (Signed)
She may take another course of Xifaxin 550 mg, 1 po bid x 10 days. But she should not take it continuously.

## 2012-10-11 NOTE — Telephone Encounter (Signed)
Patient actually found out she has a xifaxin rx on hold at the pharmacy. She will go ahead and fill that prescription.

## 2012-10-20 ENCOUNTER — Encounter: Payer: Self-pay | Admitting: Internal Medicine

## 2012-10-24 ENCOUNTER — Encounter: Payer: Self-pay | Admitting: Internal Medicine

## 2012-10-25 ENCOUNTER — Encounter: Payer: Self-pay | Admitting: Family Medicine

## 2012-11-06 ENCOUNTER — Other Ambulatory Visit: Payer: Self-pay | Admitting: Internal Medicine

## 2012-11-09 ENCOUNTER — Telehealth: Payer: Self-pay | Admitting: Internal Medicine

## 2012-11-09 ENCOUNTER — Encounter: Payer: Self-pay | Admitting: Internal Medicine

## 2012-11-09 MED ORDER — METRONIDAZOLE 250 MG PO TABS: ORAL_TABLET | ORAL | Status: DC

## 2012-11-09 MED ORDER — HYOSCYAMINE SULFATE 0.125 MG SL SUBL: SUBLINGUAL_TABLET | SUBLINGUAL | Status: DC

## 2012-11-09 NOTE — Telephone Encounter (Signed)
Patient notified of Dr. Brodie's recommendations. Rx sent. 

## 2012-11-09 NOTE — Telephone Encounter (Signed)
Please send her Flagyl 250 mg, #21, 1 po tid and Levsin SL.125 mg, #60, 1 refill

## 2012-11-09 NOTE — Telephone Encounter (Signed)
Patient states she has had stomach problems from the last few days. She has bloating and is uncomfortable. She is asking for Levsin. She states she sometimes has trouble swallowing pills and is asking if she can have it in liquid form. Please, advise.

## 2012-11-24 ENCOUNTER — Telehealth: Payer: Self-pay | Admitting: Internal Medicine

## 2012-11-24 ENCOUNTER — Encounter: Payer: Self-pay | Admitting: Internal Medicine

## 2012-11-24 NOTE — Telephone Encounter (Signed)
Patient called stating that she need all of her meds refilled to [facility]. Please assist.

## 2012-11-24 NOTE — Telephone Encounter (Signed)
The patient also sent a mychart message informing me to let her know when the medications have been called in to [facility].  Sent a mychart message back asking the pt what medications she needs refills on.  The message taken by the front desk staff did not tell.  See mychart message.  Waiting on a reply.

## 2012-11-27 ENCOUNTER — Other Ambulatory Visit: Payer: Self-pay | Admitting: Family Medicine

## 2012-11-27 MED ORDER — EZETIMIBE 10 MG PO TABS: 10.0000 mg | ORAL_TABLET | Freq: Every day | ORAL | Status: DC

## 2012-11-27 MED ORDER — FENOFIBRATE MICRONIZED 134 MG PO CAPS: 134.0000 mg | ORAL_CAPSULE | Freq: Every day | ORAL | Status: DC

## 2012-11-27 MED ORDER — ESOMEPRAZOLE MAGNESIUM 40 MG PO CPDR: 40.0000 mg | DELAYED_RELEASE_CAPSULE | Freq: Every day | ORAL | Status: DC

## 2012-11-28 ENCOUNTER — Telehealth: Payer: Self-pay | Admitting: Internal Medicine

## 2012-11-28 ENCOUNTER — Other Ambulatory Visit: Payer: Self-pay | Admitting: Internal Medicine

## 2012-11-28 MED ORDER — FENOFIBRATE MICRONIZED 134 MG PO CAPS: 134.0000 mg | ORAL_CAPSULE | Freq: Every day | ORAL | Status: DC

## 2012-11-28 MED ORDER — EZETIMIBE 10 MG PO TABS: 10.0000 mg | ORAL_TABLET | Freq: Every day | ORAL | Status: DC

## 2012-11-28 NOTE — Telephone Encounter (Signed)
Pt is out of meds and needs  fenofibrate micronized (LOFIBRA) 134 MG capsule ezetimibe (ZETIA) 10 MG tablet Today if possible.  Pt just started w/ mailorder Medicare and thought she had enough meds to get her through. Pt says this will not happen again!! Pt would [facility]! [facility] [address]

## 2012-11-28 NOTE — Telephone Encounter (Signed)
Sent to the pharmacy by e-scribe. 

## 2012-11-28 NOTE — Telephone Encounter (Signed)
Pt would like enough meds to get her through until her scripts come from [facility],  This was the pt's first time ordering from mailorder, did not realize time involed and she is out of meds.Pt is out of meds!  Would like either 30 day or 10 day. [facility] [address] ZETIA  and fenofibrate micronized (LOFIBRA) 134 MG capsule

## 2012-12-27 ENCOUNTER — Encounter: Payer: Self-pay | Admitting: Internal Medicine

## 2012-12-27 ENCOUNTER — Other Ambulatory Visit: Payer: Self-pay | Admitting: Family Medicine

## 2012-12-27 MED ORDER — VALACYCLOVIR HCL 1 G PO TABS: 1000.0000 mg | ORAL_TABLET | Freq: Two times a day (BID) | ORAL | Status: DC

## 2012-12-27 NOTE — Telephone Encounter (Signed)
Contact patient We can try valtrex 1000 mg take 2 po bid prn fever blisters     Disp 30 refill x 3   Although this is not preventive  If treat very early can usually keep them from breaking out.

## 2012-12-28 ENCOUNTER — Other Ambulatory Visit: Payer: Self-pay | Admitting: Internal Medicine

## 2013-01-01 ENCOUNTER — Encounter: Payer: Self-pay | Admitting: Internal Medicine

## 2013-01-03 ENCOUNTER — Encounter: Payer: Self-pay | Admitting: Internal Medicine

## 2013-02-16 ENCOUNTER — Encounter: Payer: Self-pay | Admitting: Internal Medicine

## 2013-02-16 ENCOUNTER — Ambulatory Visit (INDEPENDENT_AMBULATORY_CARE_PROVIDER_SITE_OTHER): Payer: Medicare Other | Admitting: Internal Medicine

## 2013-02-16 VITALS — BP 112/80 | HR 82 | Temp 98.2°F | Wt 178.0 lb

## 2013-02-16 DIAGNOSIS — H60399 Other infective otitis externa, unspecified ear: Secondary | ICD-10-CM | POA: Insufficient documentation

## 2013-02-16 DIAGNOSIS — H9209 Otalgia, unspecified ear: Secondary | ICD-10-CM | POA: Insufficient documentation

## 2013-02-16 DIAGNOSIS — H9201 Otalgia, right ear: Secondary | ICD-10-CM

## 2013-02-16 DIAGNOSIS — H60391 Other infective otitis externa, right ear: Secondary | ICD-10-CM

## 2013-02-16 MED ORDER — CIPROFLOXACIN-HYDROCORTISONE 0.2-1 % OT SUSP: 3.0000 [drp] | Freq: Two times a day (BID) | OTIC | Status: DC

## 2013-02-16 NOTE — Progress Notes (Signed)
Chief Complaint  Patient presents with  . Otalgia    Rt ear.  Started 2-3 days ago.    HPI: Patient comes in today for SDA for  new problem evaluation. Onset about 3 days ago of right ear pain and tenderness to touch. No associated fever and discharge. She then developed a ringing in her ear. No fever has some chronic nasal congestion from her allergies and does spit up some green stuff but no specific face pain on the right no difficulty with swallowing or chewing. She doesn't put stuff in her ear is however did use some old ear drops for a couple days.     Had a fall a while back tripped on a rug into her glass door had a small laceration on her nose and bruised her knees is better from that but still has a scar on her nose  ROS: See pertinent positives and negatives per HPI.  Past Medical History  Diagnosis Date  . VITAMIN D DEFICIENCY 06/30/2010  . HYPERLIPIDEMIA 06/30/2010  . HYPERTENSION 06/30/2010  . ALLERGIC RHINITIS 06/30/2010    perennial  . GERD 06/30/2010  . IBS 06/30/2010  . Headache(784.0) 06/30/2010    migraines  . Internal hemorrhoids     Family History  Problem Relation Age of Onset  . Colon cancer Mother     age 79  . Arthritis Brother   . Other Brother     History   Social History  . Marital Status: Widowed    Spouse Name: N/A    Number of Children: 2  . Years of Education: N/A   Occupational History  . OFFICE SPECIALIST    Social History Main Topics  . Smoking status: Former Smoker    Quit date: 09/06/1976  . Smokeless tobacco: Never Used  . Alcohol Use: No     Comment: rarely  . Drug Use: No  . Sexually Active: None   Other Topics Concern  . 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     Outpatient Encounter Prescriptions as of 02/16/2013  Medication Sig  Dispense Refill  . atenolol (TENORMIN) 25 MG tablet Take 12.5 mg by mouth daily.      Marland Kitchen esomeprazole (NEXIUM) 40 MG capsule Take 1 capsule (40 mg total) by mouth daily.  90 capsule  1  . ezetimibe (ZETIA) 10 MG tablet Take 1 tablet (10 mg total) by mouth daily.  30 tablet  0  . fenofibrate micronized (LOFIBRA) 134 MG capsule Take 1 capsule (134 mg total) by mouth daily before breakfast.  30 capsule  0  . hyoscyamine (LEVSIN/SL) 0.125 MG SL tablet Take one po SL BID prn  60 tablet  1  . mometasone (NASONEX) 50 MCG/ACT nasal spray Place 2 sprays into the nose daily.  51 g  1  . rifaximin (XIFAXAN) 550 MG TABS Take 550 mg by mouth daily.      . rizatriptan (MAXALT) 10 MG tablet Take 1 tablet (10 mg total) by mouth as needed for migraine. May repeat in 2 hours if needed  36 tablet  0  . traMADol (ULTRAM) 50 MG tablet TAKE 1 TABLET (50 MG TOTAL) BY MOUTH EVERY 6 (SIX) HOURS AS NEEDED FOR  PAIN.  30 tablet  0  . valACYclovir (VALTREX) 1000 MG tablet Take 1 tablet (1,000 mg total) by mouth 2 (two) times daily.  30 tablet  3  . ciprofloxacin-hydrocortisone (CIPRO HC) otic suspension Place 3 drops into the right ear 2 (two) times daily.  10 mL  0  . [DISCONTINUED] metroNIDAZOLE (FLAGYL) 250 MG tablet Take one po TID  21 tablet  0  . [DISCONTINUED] OVER THE COUNTER MEDICATION VEMMA drink , one drink every AM      . [DISCONTINUED] Probiotic Product (ALIGN) 4 MG CAPS Take 1 capsule by mouth daily.  7 capsule  0   No facility-administered encounter medications on file as of 02/16/2013.    EXAM:  BP 112/80  Pulse 82  Temp(Src) 98.2 F (36.8 C) (Oral)  Wt 178 lb (80.74 kg)  BMI 29.62 kg/m2  SpO2 98%  Body mass index is 29.62 kg/(m^2).  GENERAL: vitals reviewed and listed above, alert, oriented, appears well hydrated and in no acute distress  HEENT: atraumatic, conjunctiva  clear, no obvious abnormalities on inspection of external nose and ears has a well-healed tiny avulsion scar on the upper nose.  Left year and EACs within normal limits right EAC slightly red and at the or cold and some whitish  discharge with erythema in the canal. +1 swelling pars flaccida is red but bony landmarks are intact and pars tensa looks normal.  OP : no lesion edema or exudate sinus areas are minimally tender left more than right  NECK: no obvious masses on inspection palpation  PSYCH: pleasant and cooperative, no obvious depression or anxiety  ASSESSMENT AND PLAN:  Discussed the following assessment and plan:  Ear pain, right  Otitis, externa, infective, right Also complains of ringing in the near or possibly related to above her pain appears to be from the external ear apparatus at this time. It is possible to have a sinusitis also by her history but would not add oral antibiotics at this time. Ear drops Cipro HC discussed that uncertain cost of eardrops on Medicare part D. -Patient advised to return or notify health care team  if symptoms worsen or persist or new concerns arise.  Patient Instructions  This acts like external otitis  Antibiotic ear drops and should imprive int he next 3-5 days  But use the drops for at least  7- 10 days  If  persistent or progressive contact us for recheck  .   Otitis Externa Otitis externa is a bacterial or fungal infection of the outer ear canal. This is the area from the eardrum to the outside of the ear. Otitis externa is sometimes called "swimmer's ear." CAUSES  Possible causes of infection include:  Swimming in dirty water.  Moisture remaining in the ear after swimming or bathing.  Mild injury (trauma) to the ear.  Objects stuck in the ear (foreign body).  Cuts or scrapes (abrasions) on the outside of the ear. SYMPTOMS  The first symptom of infection is often itching in the ear canal. Later signs and symptoms may include swelling and redness of the ear canal, ear pain, and yellowish-white fluid (pus) coming from the ear. The ear pain may be worse when  pulling on the earlobe. DIAGNOSIS  Your caregiver will perform a physical exam. A sample of fluid may be taken from the ear and examined for bacteria or fungi. TREATMENT  Antibiotic ear drops are often given for 10 to 14 days. Treatment may also include pain medicine or corticosteroids  to reduce itching and swelling. PREVENTION   Keep your ear dry. Use the corner of a towel to absorb water out of the ear canal after swimming or bathing.  Avoid scratching or putting objects inside your ear. This can damage the ear canal or remove the protective wax that lines the canal. This makes it easier for bacteria and fungi to grow.  Avoid swimming in lakes, polluted water, or poorly chlorinated pools.  You may use ear drops made of rubbing alcohol and vinegar after swimming. Combine equal parts of white vinegar and alcohol in a bottle. Put 3 or 4 drops into each ear after swimming. HOME CARE INSTRUCTIONS   Apply antibiotic ear drops to the ear canal as prescribed by your caregiver.  Only take over-the-counter or prescription medicines for pain, discomfort, or fever as directed by your caregiver.  If you have diabetes, follow any additional treatment instructions from your caregiver.  Keep all follow-up appointments as directed by your caregiver. SEEK MEDICAL CARE IF:   You have a fever.  Your ear is still red, swollen, painful, or draining pus after 3 days.  Your redness, swelling, or pain gets worse.  You have a severe headache.  You have redness, swelling, pain, or tenderness in the area behind your ear. MAKE SURE YOU:   Understand these instructions.  Will watch your condition.  Will get help right away if you are not doing well or get worse. Document Released: 08/23/2005 Document Revised: 11/15/2011 Document Reviewed: 09/09/2011 [facility] Patient Information 2014 [place], [place].     Neta Mends. Tatia Petrucci M.D.

## 2013-02-16 NOTE — Patient Instructions (Signed)
This acts like external otitis  Antibiotic ear drops and should imprive int he next 3-5 days  But use the drops for at least  7- 10 days  If  persistent or progressive contact us for recheck  .   Otitis Externa Otitis externa is a bacterial or fungal infection of the outer ear canal. This is the area from the eardrum to the outside of the ear. Otitis externa is sometimes called "swimmer's ear." CAUSES  Possible causes of infection include:  Swimming in dirty water.  Moisture remaining in the ear after swimming or bathing.  Mild injury (trauma) to the ear.  Objects stuck in the ear (foreign body).  Cuts or scrapes (abrasions) on the outside of the ear. SYMPTOMS  The first symptom of infection is often itching in the ear canal. Later signs and symptoms may include swelling and redness of the ear canal, ear pain, and yellowish-white fluid (pus) coming from the ear. The ear pain may be worse when pulling on the earlobe. DIAGNOSIS  Your caregiver will perform a physical exam. A sample of fluid may be taken from the ear and examined for bacteria or fungi. TREATMENT  Antibiotic ear drops are often given for 10 to 14 days. Treatment may also include pain medicine or corticosteroids to reduce itching and swelling. PREVENTION   Keep your ear dry. Use the corner of a towel to absorb water out of the ear canal after swimming or bathing.  Avoid scratching or putting objects inside your ear. This can damage the ear canal or remove the protective wax that lines the canal. This makes it easier for bacteria and fungi to grow.  Avoid swimming in lakes, polluted water, or poorly chlorinated pools.  You may use ear drops made of rubbing alcohol and vinegar after swimming. Combine equal parts of white vinegar and alcohol in a bottle. Put 3 or 4 drops into each ear after swimming. HOME CARE INSTRUCTIONS   Apply antibiotic ear drops to the ear canal as prescribed by your caregiver.  Only take  over-the-counter or prescription medicines for pain, discomfort, or fever as directed by your caregiver.  If you have diabetes, follow any additional treatment instructions from your caregiver.  Keep all follow-up appointments as directed by your caregiver. SEEK MEDICAL CARE IF:   You have a fever.  Your ear is still red, swollen, painful, or draining pus after 3 days.  Your redness, swelling, or pain gets worse.  You have a severe headache.  You have redness, swelling, pain, or tenderness in the area behind your ear. MAKE SURE YOU:   Understand these instructions.  Will watch your condition.  Will get help right away if you are not doing well or get worse. Document Released: 08/23/2005 Document Revised: 11/15/2011 Document Reviewed: 09/09/2011 [facility] Patient Information 2014 [place], [place].

## 2013-02-26 ENCOUNTER — Encounter: Payer: Self-pay | Admitting: Internal Medicine

## 2013-02-28 ENCOUNTER — Encounter: Payer: Self-pay | Admitting: Internal Medicine

## 2013-02-28 ENCOUNTER — Ambulatory Visit (INDEPENDENT_AMBULATORY_CARE_PROVIDER_SITE_OTHER): Payer: Medicare Other | Admitting: Internal Medicine

## 2013-02-28 VITALS — BP 120/70 | HR 75 | Temp 98.5°F | Wt 180.0 lb

## 2013-02-28 DIAGNOSIS — R609 Edema, unspecified: Secondary | ICD-10-CM

## 2013-02-28 DIAGNOSIS — J069 Acute upper respiratory infection, unspecified: Secondary | ICD-10-CM

## 2013-02-28 DIAGNOSIS — R600 Localized edema: Secondary | ICD-10-CM

## 2013-02-28 DIAGNOSIS — J0191 Acute recurrent sinusitis, unspecified: Secondary | ICD-10-CM

## 2013-02-28 DIAGNOSIS — J019 Acute sinusitis, unspecified: Secondary | ICD-10-CM

## 2013-02-28 MED ORDER — ESOMEPRAZOLE MAGNESIUM 40 MG PO CPDR: 40.0000 mg | DELAYED_RELEASE_CAPSULE | Freq: Every day | ORAL | Status: DC

## 2013-02-28 MED ORDER — AMOXICILLIN 500 MG PO CAPS: 500.0000 mg | ORAL_CAPSULE | Freq: Three times a day (TID) | ORAL | Status: DC

## 2013-02-28 NOTE — Progress Notes (Signed)
Chief Complaint  Patient presents with  . Sore Throat    Cough is productive of a yellow/green sputum.  Has ear pain in both.    . Cough  . Otalgia  . Generalized Body Aches    HPI: Patient comes in today for SDA for  new problem evaluation. Onset  4 days of above   thorat is sore and aching and runny nose and achy all over.     Trying to suck it up.   As per her boss but feels bad and achy  Wants to  Cath illness before gest worse.  No sob cp hemoptysis  .  Has sinus tenderness and tightness anst   Some ear pain   A bit itchy     Also [facility]  Mail away?  ROS: See pertinent positives and negatives per HPI. Gers leg swelling end of day  Dos she need a fluid pill? Dec in am   Past Medical History  Diagnosis Date  . VITAMIN D DEFICIENCY 06/30/2010  . HYPERLIPIDEMIA 06/30/2010  . HYPERTENSION 06/30/2010  . ALLERGIC RHINITIS 06/30/2010    perennial  . GERD 06/30/2010  . IBS 06/30/2010  . Headache(784.0) 06/30/2010    migraines  . Internal hemorrhoids     Family History  Problem Relation Age of Onset  . Colon cancer Mother     age 12  . Arthritis Brother   . Other Brother     History   Social History  . Marital Status: Widowed    Spouse Name: N/A    Number of Children: 2  . Years of Education: N/A   Occupational History  . OFFICE SPECIALIST    Social History Main Topics  . Smoking status: Former Smoker    Quit date: 09/06/1976  . Smokeless tobacco: Never Used  . Alcohol Use: No     Comment: rarely  . Drug Use: No  . Sexually Active: None   Other Topics Concern  . 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     Outpatient Encounter Prescriptions as of 02/28/2013  Medication Sig Dispense Refill  . atenolol (TENORMIN) 25 MG tablet Take 12.5 mg by mouth  daily.      . ciprofloxacin-hydrocortisone (CIPRO HC) otic suspension Place 3 drops into the right ear 2 (two) times daily.  10 mL  0  . esomeprazole (NEXIUM) 40 MG capsule Take 1 capsule (40 mg total) by mouth daily.  90 capsule  1  . ezetimibe (ZETIA) 10 MG tablet Take 1 tablet (10 mg total) by mouth daily.  30 tablet  0  . fenofibrate micronized (LOFIBRA) 134 MG capsule Take 1 capsule (134 mg total) by mouth daily before breakfast.  30 capsule  0  . hyoscyamine (LEVSIN/SL) 0.125 MG SL tablet Take one po SL BID prn  60 tablet  1  . mometasone (NASONEX) 50 MCG/ACT nasal spray Place 2 sprays into the nose daily.  51 g  1  . rifaximin (XIFAXAN) 550 MG TABS Take 550 mg by mouth daily.      . rizatriptan (MAXALT) 10 MG tablet Take 1 tablet (10 mg total) by mouth as needed for migraine. May repeat in  2 hours if needed  36 tablet  0  . traMADol (ULTRAM) 50 MG tablet TAKE 1 TABLET (50 MG TOTAL) BY MOUTH EVERY 6 (SIX) HOURS AS NEEDED FOR PAIN.  30 tablet  0  . valACYclovir (VALTREX) 1000 MG tablet Take 1 tablet (1,000 mg total) by mouth 2 (two) times daily.  30 tablet  3  . [DISCONTINUED] esomeprazole (NEXIUM) 40 MG capsule Take 1 capsule (40 mg total) by mouth daily.  90 capsule  1  . amoxicillin (AMOXIL) 500 MG capsule Take 1 capsule (500 mg total) by mouth 3 (three) times daily. For sinusitis  30 capsule  0   No facility-administered encounter medications on file as of 02/28/2013.    EXAM:  BP 120/70  Pulse 75  Temp(Src) 98.5 F (36.9 C) (Oral)  Wt 180 lb (81.647 kg)  BMI 29.95 kg/m2  SpO2 96%  Body mass index is 29.95 kg/(m^2).  GENERAL: vitals reviewed and listed above, alert, oriented, appears well hydrated and in no acute distress congested   HEENT: atraumatic, conjunctiva  clear, no obvious abnormalities on inspection of external nose and ears   tms intace no edema of canals    Face tender maxillary ? Frontal  Nares mucoid dc  OP : no lesion edema or exudate  Very red tracks post  pharunx   NECK: no obvious masses on inspection palpation  Left tender ac node   LUNGS: clear to auscultation bilaterally, no wheezes, rales or rhonchi, good air movement CV: HRRR, no clubbing cyanosis or  peripheral edema nl cap refill  MS: moves all extremities without noticeable focal  Abnormality LE VV mild edema no redness PSYCH: pleasant and cooperative, no obvious depression or anxiety ASSESSMENT AND PLAN:  Discussed the following assessment and plan:  Recurrent acute sinusitis - had been suppressed conserv measures and can add antibiotic if neeced  ocnt INCS saline etc   URI, acute  Leg edema - dependent prob from vv   elevaetd and compression stockingsdiuret not advised at this time  -Patient advised to return or notify health care team  if symptoms worsen or persist or new concerns arise.  Patient Instructions    INSTRUCTIONS FOR UPPER RESPIRATORY INFECTION:  -plenty of rest and fluids  -nasal saline wash 2-3 times daily (use prepackaged nasal saline or bottled/distilled water if making your own)  -clean nose with nasal saline before using  nasal steroid or sinex  -can use sinex nasal spray for drainage and nasal congestion - but do NOT use longer then 3-4 days overuses can cause rebound problems . Can alternate nostrils. -can use tylenol or ibuprofen as directed for aches and sorethroat  -in the winter time, using a humidifier at night is helpful (please follow cleaning instructions)  -if you are taking a cough medication - use only as directed, may also try a teaspoon of honey to coat the throat and throat lozenges for short term use. -for sore throat, salt water gargles can help  -follow up if you have fevers, facial pain, tooth pain, difficulty breathing or are worsening or not getting better in 5-7 days   If pain is   Note imprveing can add  Antibiotic  For bacterial sinusitis    Sinusitis Sinusitis is redness, soreness, and swelling (inflammation) of the  paranasal sinuses. Paranasal sinuses are air pockets within the bones of your face (beneath the eyes, the middle of the forehead, or above the eyes). In healthy paranasal sinuses, mucus is able to drain out, and  air is able to circulate through them by way of your nose. However, when your paranasal sinuses are inflamed, mucus and air can become trapped. This can allow bacteria and other germs to grow and cause infection. Sinusitis can develop quickly and last only a short time (acute) or continue over a long period (chronic). Sinusitis that lasts for more than 12 weeks is considered chronic.  CAUSES  Causes of sinusitis include:  Allergies.  Structural abnormalities, such as displacement of the cartilage that separates your nostrils (deviated septum), which can decrease the air flow through your nose and sinuses and affect sinus drainage.  Functional abnormalities, such as when the small hairs (cilia) that line your sinuses and help remove mucus do not work properly or are not present. SYMPTOMS  Symptoms of acute and chronic sinusitis are the same. The primary symptoms are pain and pressure around the affected sinuses. Other symptoms include:  Upper toothache.  Earache.  Headache.  Bad breath.  Decreased sense of smell and taste.  A cough, which worsens when you are lying flat.  Fatigue.  Fever.  Thick drainage from your nose, which often is green and may contain pus (purulent).  Swelling and warmth over the affected sinuses. DIAGNOSIS  Your caregiver will perform a physical exam. During the exam, your caregiver may:  Look in your nose for signs of abnormal growths in your nostrils (nasal polyps).  Tap over the affected sinus to check for signs of infection.  View the inside of your sinuses (endoscopy) with a special imaging device with a light attached (endoscope), which is inserted into your sinuses. If your caregiver suspects that you have chronic sinusitis, one or more of  the following tests may be recommended:  Allergy tests.  Nasal culture A sample of mucus is taken from your nose and sent to a lab and screened for bacteria.  Nasal cytology A sample of mucus is taken from your nose and examined by your caregiver to determine if your sinusitis is related to an allergy. TREATMENT  Most cases of acute sinusitis are related to a viral infection and will resolve on their own within 10 days. Sometimes medicines are prescribed to help relieve symptoms (pain medicine, decongestants, nasal steroid sprays, or saline sprays).  However, for sinusitis related to a bacterial infection, your caregiver will prescribe antibiotic medicines. These are medicines that will help kill the bacteria causing the infection.  Rarely, sinusitis is caused by a fungal infection. In theses cases, your caregiver will prescribe antifungal medicine. For some cases of chronic sinusitis, surgery is needed. Generally, these are cases in which sinusitis recurs more than 3 times per year, despite other treatments. HOME CARE INSTRUCTIONS   Drink plenty of water. Water helps thin the mucus so your sinuses can drain more easily.  Use a humidifier.  Inhale steam 3 to 4 times a day (for example, sit in the bathroom with the shower running).  Apply a warm, moist washcloth to your face 3 to 4 times a day, or as directed by your caregiver.  Use saline nasal sprays to help moisten and clean your sinuses.  Take over-the-counter or prescription medicines for pain, discomfort, or fever only as directed by your caregiver. SEEK IMMEDIATE MEDICAL CARE IF:  You have increasing pain or severe headaches.  You have nausea, vomiting, or drowsiness.  You have swelling around your face.  You have vision problems.  You have a stiff neck.  You have difficulty breathing. MAKE SURE YOU:   Understand  these instructions.  Will watch your condition.  Will get help right away if you are not doing well or  get worse. Document Released: 08/23/2005 Document Revised: 11/15/2011 Document Reviewed: 09/07/2011 [facility] Patient Information 2014 [place], [place].  Use compression stockings and elevate legs as needed          Burna Mortimer K. Panosh M.D.

## 2013-02-28 NOTE — Patient Instructions (Signed)
INSTRUCTIONS FOR UPPER RESPIRATORY INFECTION:  -plenty of rest and fluids  -nasal saline wash 2-3 times daily (use prepackaged nasal saline or bottled/distilled water if making your own)  -clean nose with nasal saline before using  nasal steroid or sinex  -can use sinex nasal spray for drainage and nasal congestion - but do NOT use longer then 3-4 days overuses can cause rebound problems . Can alternate nostrils. -can use tylenol or ibuprofen as directed for aches and sorethroat  -in the winter time, using a humidifier at night is helpful (please follow cleaning instructions)  -if you are taking a cough medication - use only as directed, may also try a teaspoon of honey to coat the throat and throat lozenges for short term use. -for sore throat, salt water gargles can help  -follow up if you have fevers, facial pain, tooth pain, difficulty breathing or are worsening or not getting better in 5-7 days   If pain is   Note imprveing can add  Antibiotic  For bacterial sinusitis    Sinusitis Sinusitis is redness, soreness, and swelling (inflammation) of the paranasal sinuses. Paranasal sinuses are air pockets within the bones of your face (beneath the eyes, the middle of the forehead, or above the eyes). In healthy paranasal sinuses, mucus is able to drain out, and air is able to circulate through them by way of your nose. However, when your paranasal sinuses are inflamed, mucus and air can become trapped. This can allow bacteria and other germs to grow and cause infection. Sinusitis can develop quickly and last only a short time (acute) or continue over a long period (chronic). Sinusitis that lasts for more than 12 weeks is considered chronic.  CAUSES  Causes of sinusitis include:  Allergies.  Structural abnormalities, such as displacement of the cartilage that separates your nostrils (deviated septum), which can decrease the air flow through your nose and sinuses and affect sinus  drainage.  Functional abnormalities, such as when the small hairs (cilia) that line your sinuses and help remove mucus do not work properly or are not present. SYMPTOMS  Symptoms of acute and chronic sinusitis are the same. The primary symptoms are pain and pressure around the affected sinuses. Other symptoms include:  Upper toothache.  Earache.  Headache.  Bad breath.  Decreased sense of smell and taste.  A cough, which worsens when you are lying flat.  Fatigue.  Fever.  Thick drainage from your nose, which often is green and may contain pus (purulent).  Swelling and warmth over the affected sinuses. DIAGNOSIS  Your caregiver will perform a physical exam. During the exam, your caregiver may:  Look in your nose for signs of abnormal growths in your nostrils (nasal polyps).  Tap over the affected sinus to check for signs of infection.  View the inside of your sinuses (endoscopy) with a special imaging device with a light attached (endoscope), which is inserted into your sinuses. If your caregiver suspects that you have chronic sinusitis, one or more of the following tests may be recommended:  Allergy tests.  Nasal culture A sample of mucus is taken from your nose and sent to a lab and screened for bacteria.  Nasal cytology A sample of mucus is taken from your nose and examined by your caregiver to determine if your sinusitis is related to an allergy. TREATMENT  Most cases of acute sinusitis are related to a viral infection and will resolve on their own within 10 days. Sometimes medicines are prescribed  to help relieve symptoms (pain medicine, decongestants, nasal steroid sprays, or saline sprays).  However, for sinusitis related to a bacterial infection, your caregiver will prescribe antibiotic medicines. These are medicines that will help kill the bacteria causing the infection.  Rarely, sinusitis is caused by a fungal infection. In theses cases, your caregiver will  prescribe antifungal medicine. For some cases of chronic sinusitis, surgery is needed. Generally, these are cases in which sinusitis recurs more than 3 times per year, despite other treatments. HOME CARE INSTRUCTIONS   Drink plenty of water. Water helps thin the mucus so your sinuses can drain more easily.  Use a humidifier.  Inhale steam 3 to 4 times a day (for example, sit in the bathroom with the shower running).  Apply a warm, moist washcloth to your face 3 to 4 times a day, or as directed by your caregiver.  Use saline nasal sprays to help moisten and clean your sinuses.  Take over-the-counter or prescription medicines for pain, discomfort, or fever only as directed by your caregiver. SEEK IMMEDIATE MEDICAL CARE IF:  You have increasing pain or severe headaches.  You have nausea, vomiting, or drowsiness.  You have swelling around your face.  You have vision problems.  You have a stiff neck.  You have difficulty breathing. MAKE SURE YOU:   Understand these instructions.  Will watch your condition.  Will get help right away if you are not doing well or get worse. Document Released: 08/23/2005 Document Revised: 11/15/2011 Document Reviewed: 09/07/2011 [facility] Patient Information 2014 [place], [place].  Use compression stockings and elevate legs as needed

## 2013-03-19 ENCOUNTER — Other Ambulatory Visit: Payer: Self-pay | Admitting: Internal Medicine

## 2013-03-21 ENCOUNTER — Encounter: Payer: Self-pay | Admitting: Internal Medicine

## 2013-03-21 ENCOUNTER — Telehealth: Payer: Self-pay | Admitting: Internal Medicine

## 2013-03-21 MED ORDER — RIZATRIPTAN BENZOATE 10 MG PO TABS: 10.0000 mg | ORAL_TABLET | ORAL | Status: DC | PRN

## 2013-03-21 NOTE — Telephone Encounter (Signed)
Patient sent MyChart message back informing me that 24 is a 3 month supply.  Will now send to [facility].

## 2013-03-21 NOTE — Telephone Encounter (Signed)
Spoke to the pt.  Informed her that her insurance would likely not cover #90.  She will call her insurance company and find out how many is a 90 day supply.

## 2013-03-21 NOTE — Telephone Encounter (Signed)
Pt is requesting new rx generic maxalt 10 mg #90 with refills sent into [facility]

## 2013-04-02 ENCOUNTER — Other Ambulatory Visit: Payer: Self-pay | Admitting: Family Medicine

## 2013-04-02 MED ORDER — ATENOLOL 25 MG PO TABS: 12.5000 mg | ORAL_TABLET | Freq: Every day | ORAL | Status: DC

## 2013-04-11 ENCOUNTER — Other Ambulatory Visit: Payer: Self-pay | Admitting: Internal Medicine

## 2013-04-11 ENCOUNTER — Encounter: Payer: Self-pay | Admitting: Internal Medicine

## 2013-04-12 ENCOUNTER — Other Ambulatory Visit: Payer: Self-pay | Admitting: Family Medicine

## 2013-04-12 MED ORDER — TRAMADOL HCL 50 MG PO TABS: ORAL_TABLET | ORAL | Status: DC

## 2013-04-24 ENCOUNTER — Other Ambulatory Visit: Payer: Self-pay | Admitting: Family Medicine

## 2013-04-24 ENCOUNTER — Encounter: Payer: Self-pay | Admitting: Internal Medicine

## 2013-04-30 ENCOUNTER — Encounter: Payer: Self-pay | Admitting: Internal Medicine

## 2013-04-30 ENCOUNTER — Ambulatory Visit (INDEPENDENT_AMBULATORY_CARE_PROVIDER_SITE_OTHER): Payer: Medicare Other | Admitting: Internal Medicine

## 2013-04-30 VITALS — BP 114/70 | HR 98 | Temp 98.5°F | Wt 175.0 lb

## 2013-04-30 DIAGNOSIS — J309 Allergic rhinitis, unspecified: Secondary | ICD-10-CM

## 2013-04-30 DIAGNOSIS — J0191 Acute recurrent sinusitis, unspecified: Secondary | ICD-10-CM

## 2013-04-30 DIAGNOSIS — J019 Acute sinusitis, unspecified: Secondary | ICD-10-CM

## 2013-04-30 MED ORDER — AMOXICILLIN 500 MG PO CAPS: 500.0000 mg | ORAL_CAPSULE | Freq: Three times a day (TID) | ORAL | Status: DC

## 2013-04-30 NOTE — Patient Instructions (Addendum)
If sinusitis recurring see ent  . Antibiotic if persist net face pain and headache .

## 2013-04-30 NOTE — Progress Notes (Signed)
Chief Complaint  Patient presents with  . Chills    Started on last Thursday.  Cough is productive of a yellow sputum.  . Sore Throat  . Otalgia  . Productive cough    HPI: Patient comes in for an acute same day visit for the above problems. Onset 4 days ago the above symptoms and thinks she is getting worse with left face pain left ear pain and congestion states that she tends to get better with amoxicillin or treatment. Coughing a pill of phlegm. She has sweats and chills but no specific fever. Called in sick to work  today to come in to the doctor. Still on cortisone nose spray for allergy.  Left nostril more congested than the right.  headches in face   Left ear.  Pain not to touch.  She is due to retire in 5 weeks. She does interact with the public. ROS: See pertinent positives and negatives per HPI. Last treatment with the antibiotic was in June. No chest pain or shortness of breath. Only takes tramadol as needed not that often.  Past Medical History  Diagnosis Date  . VITAMIN D DEFICIENCY 06/30/2010  . HYPERLIPIDEMIA 06/30/2010  . HYPERTENSION 06/30/2010  . ALLERGIC RHINITIS 06/30/2010    perennial  . GERD 06/30/2010  . IBS 06/30/2010  . Headache(784.0) 06/30/2010    migraines  . Internal hemorrhoids     Family History  Problem Relation Age of Onset  . Colon cancer Mother     age 48  . Arthritis Brother   . Other Brother     History   Social History  . Marital Status: Widowed    Spouse Name: N/A    Number of Children: 2  . Years of Education: N/A   Occupational History  . OFFICE SPECIALIST    Social History Main Topics  . Smoking status: Former Smoker    Quit date: 09/06/1976  . Smokeless tobacco: Never Used  . Alcohol Use: No     Comment: rarely  . Drug Use: No  . Sexual Activity: None   Other Topics Concern  . 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     Outpatient Encounter Prescriptions as of 04/30/2013  Medication Sig Dispense Refill  . atenolol (TENORMIN) 25 MG tablet Take 0.5 tablets (12.5 mg total) by mouth daily.  90 tablet  0  . esomeprazole (NEXIUM) 40 MG capsule Take 1 capsule (40 mg total) by mouth daily.  90 capsule  1  . ezetimibe (ZETIA) 10 MG tablet Take 1 tablet (10 mg total) by mouth daily.  30 tablet  0  . fenofibrate micronized (LOFIBRA) 134 MG capsule Take 1 capsule by mouth  daily before breakfast  90 capsule  0  . mometasone (NASONEX) 50 MCG/ACT nasal spray Place 2 sprays into the nose daily.  51 g  1  . rifaximin (XIFAXAN) 550 MG TABS Take 550 mg by mouth daily.      . rizatriptan (MAXALT) 10 MG tablet Take 1 tablet (10 mg total) by mouth as needed for migraine. May repeat in 2 hours if needed  24 tablet  0  . traMADol (ULTRAM) 50 MG tablet TAKE 1 TABLET (50 MG TOTAL)  BY MOUTH EVERY 6 (SIX) HOURS AS NEEDED FOR PAIN.  30 tablet  0  . valACYclovir (VALTREX) 1000 MG tablet Take 1 tablet (1,000 mg total) by mouth 2 (two) times daily.  30 tablet  3  . amoxicillin (AMOXIL) 500 MG capsule Take 1 capsule (500 mg total) by mouth 3 (three) times daily. For sinusitis  30 capsule  0  . hyoscyamine (LEVSIN/SL) 0.125 MG SL tablet Take one po SL BID prn  60 tablet  1  . [DISCONTINUED] amoxicillin (AMOXIL) 500 MG capsule Take 1 capsule (500 mg total) by mouth 3 (three) times daily. For sinusitis  30 capsule  0  . [DISCONTINUED] ciprofloxacin-hydrocortisone (CIPRO HC) otic suspension Place 3 drops into the right ear 2 (two) times daily.  10 mL  0  . [DISCONTINUED] fenofibrate micronized (LOFIBRA) 134 MG capsule Take 1 capsule (134 mg total) by mouth daily before breakfast.  30 capsule  0  . [DISCONTINUED] ZETIA 10 MG tablet Take 1 tablet by mouth  daily  90 tablet  0   No facility-administered encounter medications on file as of 04/30/2013.     EXAM:  BP 114/70  Pulse 98  Temp(Src) 98.5 F (36.9 C) (Oral)  Wt 175 lb (79.379 kg)  BMI 29.12 kg/m2  SpO2 97%  Body mass index is 29.12 kg/(m^2). WDWN in NAD  quiet respirations; mildly congested  somewhat hoarse. Non toxic . HEENT: Normocephalic ;atraumatic , Eyes;  PERRL, EOMs  Full, lids and conjunctiva clear,,Ears: no deformities, canals nl, TM landmarks normal on the right left with some wax small amount of TM is gray, Nose: no deformity or  but congested; yellow discharge left maxilla tender left frontal area nontender  : OP clear without lesion or edema . Neck: Supple without adenopathy or masses or bruits Chest:  Clear to A&P without wheezes rales or rhonchi CV:  S1-S2 no gallops or murmurs peripheral perfusion is normal Skin :nl perfusion and no acute rashes  ASSESSMENT AND PLAN:  Discussed the following assessment and plan:  Recurrent acute sinusitis - could wait this out but localized and according to patient responds quickly to antibiotic amoxicillin given for these reasons alth less 14 days Can use nasal saline her nose spray there wax softening drops in the left and persistent symptoms we can flush out the left ear. But we're going to treat her with antibiotics anyway. Counseling and expectant management. -Patient advised to return or notify health care team  if symptoms worsen or persist or new concerns arise.  Patient Instructions  If sinusitis recurring see ent  . Antibiotic if persist net face pain and headache .   Neta Mends. Benjamin Merrihew M.D.

## 2013-05-01 ENCOUNTER — Ambulatory Visit: Payer: Medicare Other | Admitting: Internal Medicine

## 2013-05-11 ENCOUNTER — Encounter: Payer: Self-pay | Admitting: Internal Medicine

## 2013-05-11 ENCOUNTER — Telehealth: Payer: Self-pay | Admitting: Family Medicine

## 2013-05-11 NOTE — Telephone Encounter (Signed)
-----   Message ----- From: [name] Sent: 05/11/2013 9:33 AM To: [facility] Clinical Pool Subject: Non-Urgent Medical Question I am still getting hot flashes all the time. Is there anything I can take to get rid of this? I do want a flu shot, but do not know what pharmacy covers medicare. PS. I just finished my antibotics on Sept. 4th. Hope I don't get sick any more. I will be retiring on Oct. 2, 2014. I have to stay healthy.

## 2013-05-16 ENCOUNTER — Encounter: Payer: Self-pay | Admitting: Internal Medicine

## 2013-05-17 ENCOUNTER — Encounter: Payer: Self-pay | Admitting: Internal Medicine

## 2013-05-18 ENCOUNTER — Encounter: Payer: Self-pay | Admitting: Internal Medicine

## 2013-05-23 ENCOUNTER — Other Ambulatory Visit: Payer: Self-pay | Admitting: Internal Medicine

## 2013-05-23 NOTE — Telephone Encounter (Signed)
No easy answer to the hot flushes    Depends on why you are having them If they  are getting much worse then plan ROV to discuss   Flu vaccines check with  Your insurance we are giving them in the office

## 2013-05-23 NOTE — Telephone Encounter (Signed)
Left message on home and cell for the pt to return my call. 

## 2013-05-29 ENCOUNTER — Telehealth: Payer: Self-pay | Admitting: Internal Medicine

## 2013-05-29 NOTE — Telephone Encounter (Signed)
See other note

## 2013-05-29 NOTE — Telephone Encounter (Signed)
Pt returned your call-pls call °

## 2013-05-29 NOTE — Telephone Encounter (Signed)
Spoke to the pt.  She seen a commercial on tv and has ordered an all natural/herbal supplement called Profemin.  If this does not help than she will call back for an appt to see WP.

## 2013-05-29 NOTE — Telephone Encounter (Signed)
Left message on home and cell for the pt to return my call. 

## 2013-06-08 ENCOUNTER — Telehealth: Payer: Self-pay | Admitting: Internal Medicine

## 2013-06-08 ENCOUNTER — Ambulatory Visit: Payer: Medicare Other | Admitting: Internal Medicine

## 2013-06-08 NOTE — Telephone Encounter (Signed)
Please charge no show, late cancellation

## 2013-06-19 ENCOUNTER — Encounter: Payer: Self-pay | Admitting: Internal Medicine

## 2013-06-19 ENCOUNTER — Ambulatory Visit (INDEPENDENT_AMBULATORY_CARE_PROVIDER_SITE_OTHER): Payer: Medicare Other | Admitting: Internal Medicine

## 2013-06-19 VITALS — BP 126/80 | HR 81 | Temp 98.1°F | Wt 173.0 lb

## 2013-06-19 DIAGNOSIS — J0191 Acute recurrent sinusitis, unspecified: Secondary | ICD-10-CM

## 2013-06-19 DIAGNOSIS — R232 Flushing: Secondary | ICD-10-CM | POA: Insufficient documentation

## 2013-06-19 DIAGNOSIS — J019 Acute sinusitis, unspecified: Secondary | ICD-10-CM

## 2013-06-19 MED ORDER — AMOXICILLIN-POT CLAVULANATE 875-125 MG PO TABS: 1.0000 | ORAL_TABLET | Freq: Two times a day (BID) | ORAL | Status: DC

## 2013-06-19 NOTE — Patient Instructions (Signed)
Broader spectrum antibiotic today for your sinus infection.  Concern that risk of estrogen would be worse than help . sometimes lexapro or celexa can help in low dose these are   Antidepressants that have helped pms and flushes in some people. Advise recheck after sinus infection is better to discuss if you want to try these. Or call .

## 2013-06-19 NOTE — Progress Notes (Signed)
Chief Complaint  Patient presents with  . Sinusitis    Hot flashes    HPI: Patient comes in today for SDA for  new problem evaluation.  She feels like she has another sinus infection got better for the last one in August. Then over the last 2 weeks has had upper respiratory congestion continued yellow thick discharge not green yet as she says pressure tenderness of the nasal bridge and under her eyes. She is doing her decongestants doesn't think this is going to turn around. No fever or shortness of breath.  She's also still concerned about her hot flushes that she gets every day hot and cold asks about estrogen she is now retired no specific fevers.  Hasn't been able to see Dr. Juanda Chance yet because she had an intervening event had to change her appointment  ROS: See pertinent positives and negatives per HPI.  Past Medical History  Diagnosis Date  . VITAMIN D DEFICIENCY 06/30/2010  . HYPERLIPIDEMIA 06/30/2010  . HYPERTENSION 06/30/2010  . ALLERGIC RHINITIS 06/30/2010    perennial  . GERD 06/30/2010  . IBS 06/30/2010  . Headache(784.0) 06/30/2010    migraines  . Internal hemorrhoids   . Coughing up blood 08/13/2012    piece of tissue? will send to path poss from upper airway sinusitis  Final path was "abscess"     Family History  Problem Relation Age of Onset  . Colon cancer Mother     age 39  . Arthritis Brother   . Other Brother     History   Social History  . Marital Status: Widowed    Spouse Name: N/A    Number of Children: 2  . Years of Education: N/A   Occupational History  . OFFICE SPECIALIST    Social History Main Topics  . Smoking status: Former Smoker    Quit date: 09/06/1976  . Smokeless tobacco: Never Used  . Alcohol Use: No     Comment: rarely  . Drug Use: No  . Sexual Activity: None   Other Topics Concern  . 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     Outpatient Encounter Prescriptions as of 06/19/2013  Medication Sig Dispense Refill  . atenolol (TENORMIN) 25 MG tablet Take 0.5 tablets (12.5 mg total) by mouth daily.  90 tablet  0  . esomeprazole (NEXIUM) 40 MG capsule Take 1 capsule (40 mg total) by mouth daily.  90 capsule  1  . ezetimibe (ZETIA) 10 MG tablet Take 1 tablet (10 mg total) by mouth daily.  30 tablet  0  . fenofibrate micronized (LOFIBRA) 134 MG capsule Take 1 capsule by mouth  daily before breakfast  90 capsule  0  . hyoscyamine (LEVSIN/SL) 0.125 MG SL tablet Take one po SL BID prn  60 tablet  1  . rifaximin (XIFAXAN) 550 MG TABS Take 550 mg by mouth daily.      . rizatriptan (MAXALT) 10 MG tablet Take 1 tablet (10 mg total) by mouth as needed for  migraine. May repeat in 2  hours if needed  24 tablet  0  . traMADol (ULTRAM) 50 MG tablet TAKE 1 TABLET (50 MG TOTAL) BY MOUTH EVERY 6 (SIX) HOURS  AS NEEDED FOR PAIN.  30 tablet  0  . valACYclovir (VALTREX) 1000 MG tablet Take 1 tablet (1,000 mg total) by mouth 2 (two) times daily.  30 tablet  3  . amoxicillin-clavulanate (AUGMENTIN) 875-125 MG per tablet Take 1 tablet by mouth every 12 (twelve) hours.  20 tablet  0  . mometasone (NASONEX) 50 MCG/ACT nasal spray Place 2 sprays into the nose daily.  51 g  1  . [DISCONTINUED] amoxicillin (AMOXIL) 500 MG capsule Take 1 capsule (500 mg total) by mouth 3 (three) times daily. For sinusitis  30 capsule  0  . [DISCONTINUED] rizatriptan (MAXALT) 10 MG tablet Take 10 mg by mouth as needed for migraine. May repeat in 2 hours if needed       No facility-administered encounter medications on file as of 06/19/2013.    EXAM:  BP 126/80  Pulse 81  Temp(Src) 98.1 F (36.7 C) (Oral)  Wt 173 lb (78.472 kg)  BMI 28.79 kg/m2  SpO2 91%  Body mass index is 28.79 kg/(m^2).  GENERAL: vitals reviewed and listed above, alert, oriented, appears well  hydrated and in no acute distress she appears modestly congested but nontoxic  HEENT: atraumatic, conjunctiva  clear, no obvious abnormalities on inspection of external nose and ears TMs are clear nares congested face minimally tender i upper maxillary area OP : no lesion edema or exudate drainage tracts seen no acute lesions  NECK: no obvious masses on inspection palpation no adenopathy  LUNGS: clear to auscultation bilaterally, no wheezes, rales or rhonchi, good air movement  CV: HRRR, no clubbing cyanosis nl cap refill   MS: moves all extremities without noticeable focal  abnormality  PSYCH: pleasant and cooperative, no obvious depression or anxiety  ASSESSMENT AND PLAN:  Discussed the following assessment and plan:  Recurrent acute sinusitis - Problematic 2 weeks out use broader spectrum antibiotic followup if not better continue other measures  Vasomotor flushing - presumed estrogen defic . risk of estrogen disc other measures when better ir she wishes   -Patient advised to return or notify health care team  if symptoms worsen or persist or new concerns arise.  Patient Instructions  Broader spectrum antibiotic today for your sinus infection.  Concern that risk of estrogen would be worse than help . sometimes lexapro or celexa can help in low dose these are   Antidepressants that have helped pms and flushes in some people. Advise recheck after sinus infection is better to discuss if you want to try these. Or call .    Neta Mends. Orhan Mayorga M.D.

## 2013-07-02 ENCOUNTER — Telehealth: Payer: Self-pay | Admitting: Internal Medicine

## 2013-07-02 NOTE — Telephone Encounter (Signed)
Spoke with patient and she just wants to schedule OV for IBS with Dr. Juanda Chance. Scheduled on 08/14/13. Told patient she must call before 5:00 PM on Monday 08/13/13 to avoid late cancellation fee.(she cancelled last OV)

## 2013-07-17 ENCOUNTER — Other Ambulatory Visit: Payer: Self-pay | Admitting: Internal Medicine

## 2013-07-17 DIAGNOSIS — Z1231 Encounter for screening mammogram for malignant neoplasm of breast: Secondary | ICD-10-CM

## 2013-08-06 ENCOUNTER — Other Ambulatory Visit: Payer: Self-pay | Admitting: Internal Medicine

## 2013-08-06 NOTE — Telephone Encounter (Signed)
Tramadol last filled on 04/12/13 #30 with 0 additional refills Last seen acutely on 06/19/13 but no future appt. Please advise.  Thanks!

## 2013-08-06 NOTE — Telephone Encounter (Signed)
Pt can not afford zetia/ pt would like something cheaper and samples if avail

## 2013-08-06 NOTE — Telephone Encounter (Signed)
Pt needs new rx for nexium 40 mg#30 with refills. Pt stated otc is not working. Pt also needs tramadol#30 and rizatriptan 10 mg #18.pt will pay out of pocket [facility] [address].

## 2013-08-07 ENCOUNTER — Other Ambulatory Visit: Payer: Self-pay | Admitting: Family Medicine

## 2013-08-07 ENCOUNTER — Telehealth: Payer: Self-pay | Admitting: Family Medicine

## 2013-08-07 MED ORDER — ESOMEPRAZOLE MAGNESIUM 40 MG PO CPDR: 40.0000 mg | DELAYED_RELEASE_CAPSULE | Freq: Every day | ORAL | Status: DC

## 2013-08-07 NOTE — Telephone Encounter (Signed)
There is no substitute for the zetia that i am aware of at this time.  Advise we discuss this at her  Visit .  To decide  Medication management.  She can ask her insurance also.

## 2013-08-07 NOTE — Telephone Encounter (Signed)
I spoke to the pt.  She does not want to change to a statin medication.  However, she cannot afford the Zetia.  It is costing her almost four hundred dollars for a 3 month supply.  She is requesting a new medication.  She will make yearly appt when I call back.  Please advise.  Thanks!

## 2013-08-07 NOTE — Telephone Encounter (Signed)
Ok to refill tramadol 30 # maxalt 18# refill x 2  nexium 40mg    30 refill x 5   zetia is in a class by itself  Can have samples if we have any ( rarely)  Would have to change to a statin medication.  Her last cholesterol panel was excellent   And she is due for yearly check and labs   I suggest she try off the zetia and then have wellness and lab done in a month to discuss   M,edication management of her lipids.

## 2013-08-08 NOTE — Telephone Encounter (Signed)
Left message on home and cell for the pt to return my call. 

## 2013-08-10 NOTE — Telephone Encounter (Signed)
Patient notified to pick up samples at the front desk.  Also made CPE appt on 09/04/13.  Has enough samples to last until then.  Will talk about medication change with WP.

## 2013-08-14 ENCOUNTER — Encounter: Payer: Self-pay | Admitting: Internal Medicine

## 2013-08-14 ENCOUNTER — Ambulatory Visit (INDEPENDENT_AMBULATORY_CARE_PROVIDER_SITE_OTHER): Payer: Medicare Other | Admitting: Internal Medicine

## 2013-08-14 VITALS — BP 100/62 | HR 80 | Ht 65.35 in | Wt 173.0 lb

## 2013-08-14 DIAGNOSIS — K625 Hemorrhage of anus and rectum: Secondary | ICD-10-CM

## 2013-08-14 DIAGNOSIS — R141 Gas pain: Secondary | ICD-10-CM

## 2013-08-14 DIAGNOSIS — R140 Abdominal distension (gaseous): Secondary | ICD-10-CM

## 2013-08-14 MED ORDER — HYDROCORTISONE ACETATE 25 MG RE SUPP: 25.0000 mg | Freq: Every day | RECTAL | Status: DC

## 2013-08-14 NOTE — Patient Instructions (Addendum)
We have sent the following medications to your pharmacy for you to pick up at your convenience: Anusol [facility] -every night  You will be due for a recall colonoscopy in 10/2014. We will send you a reminder in the mail when it gets closer to that time.  You have been scheduled for an abdominal ultrasound at [facility] (1st floor of hospital) on Friday,  08/17/13 at 8:00 am. Please arrive 15 minutes prior to your appointment for registration. Make certain not to have anything to eat or drink 6 hours prior to your appointment. Should you need to reschedule your appointment, please contact radiology at [phone]. This test typically takes about 30 minutes to perform.   High-Fiber Diet Fiber is found in fruits, vegetables, and grains. A high-fiber diet encourages the addition of more whole grains, legumes, fruits, and vegetables in your diet. The recommended amount of fiber for adult males is 38 g per day. For adult females, it is 25 g per day. Pregnant and lactating women should get 28 g of fiber per day. If you have a digestive or bowel problem, ask your caregiver for advice before adding high-fiber foods to your diet. Eat a variety of high-fiber foods instead of only a select few type of foods.  PURPOSE  To increase stool bulk.  To make bowel movements more regular to prevent constipation.  To lower cholesterol.  To prevent overeating. WHEN IS THIS DIET USED?  It may be used if you have constipation and hemorrhoids.  It may be used if you have uncomplicated diverticulosis (intestine condition) and irritable bowel syndrome.  It may be used if you need help with weight management.  It may be used if you want to add it to your diet as a protective measure against atherosclerosis, diabetes, and cancer. SOURCES OF FIBER  Whole-grain breads and cereals.  Fruits, such as apples, oranges, bananas, berries, prunes, and pears.  Vegetables, such as green peas, carrots, sweet potatoes,  beets, broccoli, cabbage, spinach, and artichokes.  Legumes, such split peas, soy, lentils.  Almonds. FIBER CONTENT IN FOODS Starches and Grains / Dietary Fiber (g)  Cheerios, 1 cup / 3 g  Corn Flakes cereal, 1 cup / 0.7 g  Rice crispy treat cereal, 1 cup / 0.3 g  Instant oatmeal (cooked),  cup / 2 g  Frosted wheat cereal, 1 cup / 5.1 g  Brown, long-grain rice (cooked), 1 cup / 3.5 g  White, long-grain rice (cooked), 1 cup / 0.6 g  Enriched macaroni (cooked), 1 cup / 2.5 g Legumes / Dietary Fiber (g)  Baked beans (canned, plain, or vegetarian),  cup / 5.2 g  Kidney beans (canned),  cup / 6.8 g  Pinto beans (cooked),  cup / 5.5 g Breads and Crackers / Dietary Fiber (g)  Plain or honey graham crackers, 2 squares / 0.7 g  Saltine crackers, 3 squares / 0.3 g  Plain, salted pretzels, 10 pieces / 1.8 g  Whole-wheat bread, 1 slice / 1.9 g  White bread, 1 slice / 0.7 g  Raisin bread, 1 slice / 1.2 g  Plain bagel, 3 oz / 2 g  Flour tortilla, 1 oz / 0.9 g  Corn tortilla, 1 small / 1.5 g  Hamburger or hotdog bun, 1 small / 0.9 g Fruits / Dietary Fiber (g)  Apple with skin, 1 medium / 4.4 g  Sweetened applesauce,  cup / 1.5 g  Banana,  medium / 1.5 g  Grapes, 10 grapes / 0.4  g  Orange, 1 small / 2.3 g  Raisin, 1.5 oz / 1.6 g  Melon, 1 cup / 1.4 g Vegetables / Dietary Fiber (g)  Green beans (canned),  cup / 1.3 g  Carrots (cooked),  cup / 2.3 g  Broccoli (cooked),  cup / 2.8 g  Peas (cooked),  cup / 4.4 g  Mashed potatoes,  cup / 1.6 g  Lettuce, 1 cup / 0.5 g  Corn (canned),  cup / 1.6 g  Tomato,  cup / 1.1 g Document Released: 08/23/2005 Document Revised: 02/22/2012 Document Reviewed: 11/25/2011 [facility] Patient Information 2014 [place], [place].  CC: Berniece Andreas

## 2013-08-14 NOTE — Progress Notes (Signed)
Michele Monroe February 23, 1946 [id number]   History of Present Illness:  This is a 67 year old white female with one  episode of rectal bleeding which occurred in August 2014. She tried to call our office but there were no openings. She has not had a recurrence of the bleeding since that time. There is a history of colon cancer in her mother at age 52. Her last colonoscopy was in February 2011. It showed internal hemorrhoids. She denies rectal pain. She has irritable bowel syndrome and bloating.  Past Medical History  Diagnosis Date  . VITAMIN D DEFICIENCY 06/30/2010  . HYPERLIPIDEMIA 06/30/2010  . HYPERTENSION 06/30/2010  . ALLERGIC RHINITIS 06/30/2010    perennial  . GERD 06/30/2010  . IBS 06/30/2010  . Headache(784.0) 06/30/2010    migraines  . Internal hemorrhoids   . Coughing up blood 08/13/2012    piece of tissue? will send to path poss from upper airway sinusitis  Final path was "abscess"     Past Surgical History  Procedure Laterality Date  . Rotator cuff repair      Rt  . Breast surgery      needle bx left breast  . Feet      surgery  . Varicose veins      No Known Allergies  Review of Systems: Gas and bloating. Occasional constipation  The remainder of the 10 point ROS is negative except as outlined in the H&P  Physical Exam: General Appearance Well developed, in no distress Eyes  Non icteric  HEENT  Non traumatic, normocephalic  Mouth No lesion, tongue papillated, no cheilosis Neck Supple without adenopathy, thyroid not enlarged, no carotid bruits, no JVD Lungs Clear to auscultation bilaterally COR Normal S1, normal S2, regular rhythm, no murmur, quiet precordium Abdomen Soft, tender in the right and left lower quadrant with normoactive bowel sounds Rectal and anoscopic exam reveals large external hemorrhoids. Normal rectal sphincter tone. First grade internal hemorrhoids. Hyperemia and edema. No active bleeding. No stool in the rectum. Extremities  No pedal  edema Skin No lesions Neurological Alert and oriented x 3 Psychological Normal mood and affect  Assessment and Plan:   Problem #1 Symptomatic first grade internal and external hemorrhoids. These are not currently bleeding. We have discussed ligation versus conservative treatment. We will start with Anusol-HC suppositories at bedtime, stool softeners and a high fiber diet.  Problem #2 Dyspepsia and bloating. This is likely related to irritable bowel syndrome, however, we will obtain an upper abdominal ultrasound to rule out other causes.  Problem #3 Positive family history of colon cancer in patient's mother. Patient will be due for a colonoscopy in February 2016.    [name] 08/14/2013

## 2013-08-17 ENCOUNTER — Ambulatory Visit (HOSPITAL_COMMUNITY)
Admission: RE | Admit: 2013-08-17 | Discharge: 2013-08-17 | Disposition: A | Discharge status: Home / Self Care | Payer: Medicare Other | Source: Ambulatory Visit | Attending: Internal Medicine | Admitting: Internal Medicine

## 2013-08-17 DIAGNOSIS — R141 Gas pain: Secondary | ICD-10-CM | POA: Insufficient documentation

## 2013-08-17 DIAGNOSIS — R140 Abdominal distension (gaseous): Secondary | ICD-10-CM

## 2013-08-17 DIAGNOSIS — N289 Disorder of kidney and ureter, unspecified: Secondary | ICD-10-CM | POA: Insufficient documentation

## 2013-08-17 DIAGNOSIS — K7689 Other specified diseases of liver: Secondary | ICD-10-CM | POA: Insufficient documentation

## 2013-08-17 DIAGNOSIS — D7389 Other diseases of spleen: Secondary | ICD-10-CM | POA: Insufficient documentation

## 2013-08-17 DIAGNOSIS — R142 Eructation: Secondary | ICD-10-CM | POA: Insufficient documentation

## 2013-08-20 ENCOUNTER — Ambulatory Visit (HOSPITAL_COMMUNITY)
Admission: RE | Admit: 2013-08-20 | Discharge: 2013-08-20 | Disposition: A | Discharge status: Home / Self Care | Payer: Medicare Other | Source: Ambulatory Visit | Attending: Internal Medicine | Admitting: Internal Medicine

## 2013-08-20 DIAGNOSIS — Z1231 Encounter for screening mammogram for malignant neoplasm of breast: Secondary | ICD-10-CM | POA: Insufficient documentation

## 2013-08-21 LAB — HM MAMMOGRAPHY: HM Mammogram: NORMAL

## 2013-09-04 ENCOUNTER — Ambulatory Visit (INDEPENDENT_AMBULATORY_CARE_PROVIDER_SITE_OTHER): Payer: Medicare Other | Admitting: Internal Medicine

## 2013-09-04 ENCOUNTER — Encounter: Payer: Self-pay | Admitting: Internal Medicine

## 2013-09-04 VITALS — BP 114/74 | HR 80 | Temp 98.0°F | Wt 169.0 lb

## 2013-09-04 DIAGNOSIS — Z0000 Encounter for general adult medical examination without abnormal findings: Secondary | ICD-10-CM

## 2013-09-04 DIAGNOSIS — E785 Hyperlipidemia, unspecified: Secondary | ICD-10-CM

## 2013-09-04 DIAGNOSIS — K219 Gastro-esophageal reflux disease without esophagitis: Secondary | ICD-10-CM

## 2013-09-04 DIAGNOSIS — G44209 Tension-type headache, unspecified, not intractable: Secondary | ICD-10-CM

## 2013-09-04 DIAGNOSIS — I10 Essential (primary) hypertension: Secondary | ICD-10-CM

## 2013-09-04 DIAGNOSIS — D649 Anemia, unspecified: Secondary | ICD-10-CM

## 2013-09-04 DIAGNOSIS — I8390 Asymptomatic varicose veins of unspecified lower extremity: Secondary | ICD-10-CM

## 2013-09-04 DIAGNOSIS — F4541 Pain disorder exclusively related to psychological factors: Secondary | ICD-10-CM

## 2013-09-04 LAB — BASIC METABOLIC PANEL
BUN: 15 mg/dL (ref 6–23)
Calcium: 9.9 mg/dL (ref 8.4–10.5)
Chloride: 104 mEq/L (ref 96–112)
GFR: 63.28 mL/min (ref >60.00)
Potassium: 3.9 mEq/L (ref 3.5–5.1)
Sodium: 140 mEq/L (ref 135–145)

## 2013-09-04 LAB — HEPATIC FUNCTION PANEL
AST: 29 U/L (ref 0–37)
Alkaline Phosphatase: 44 U/L (ref 39–117)
Bilirubin, Direct: 0 mg/dL (ref 0.0–0.3)
Total Bilirubin: 0.4 mg/dL (ref 0.3–1.2)
Total Protein: 7.8 g/dL (ref 6.0–8.3)

## 2013-09-04 LAB — CBC WITH DIFFERENTIAL/PLATELET
Basophils Absolute: 0.1 10*3/uL (ref 0.0–0.1)
Eosinophils Relative: 4.7 % (ref 0.0–5.0)
HCT: 38.7 % (ref 36.0–46.0)
Lymphocytes Relative: 30.2 % (ref 12.0–46.0)
Lymphs Abs: 2.9 10*3/uL (ref 0.7–4.0)
Monocytes Relative: 5.8 % (ref 3.0–12.0)
Platelets: 338 10*3/uL (ref 150.0–400.0)
RBC: 4.57 Mil/uL (ref 3.87–5.11)
RDW: 12.8 % (ref 11.5–14.6)
WBC: 9.7 10*3/uL (ref 4.5–10.5)

## 2013-09-04 LAB — TSH: TSH: 1.16 u[IU]/mL (ref 0.35–5.50)

## 2013-09-04 LAB — LIPID PANEL
Cholesterol: 165 mg/dL (ref 0–200)
LDL Cholesterol: 82 mg/dL (ref 0–99)
VLDL: 22 mg/dL (ref 0.0–40.0)

## 2013-09-04 NOTE — Progress Notes (Signed)
Chief Complaint  Patient presents with  . Annual Exam    HPI: Patient comes in today for Preventive Medicare wellness visit . No major injuries, ed visits ,hospitalizations , new medications since last visit. Sinuses are better today has actually lost some weight changing her diet. Pressure seems to be better. Samples of zetia cause of cost  Has ocass  ocass tramadol when pain is bad about every few weeks.  Gi gettig better  gerd on ppi now nexium  Had abd [place] that showed nl gb but a area on kidny that needed a fu scan [place]  in 6 months ? Who is going to order this  Doing well  To get vv done left leg helped on right   Health Maintenance  Topic Date Due  . Influenza Vaccine  04/06/2014  . Mammogram  08/22/2015  . Tetanus/tdap  09/06/2017  . Colonoscopy  10/08/2019  . Pneumococcal Polysaccharide Vaccine Age 71 And Over  Completed  . Zostavax  Completed   Health Maintenance Review     Hearing: Okay  Vision:  No limitations at present . Last eye check UTD  Safety:  Has smoke detector and wears seat belts.  No firearms. No excess sun exposure. Sees dentist regularly.  Falls: No except on face  Advance directive :  Reviewed  .  Memory: Felt to be good  , no concern from her or her family.  Depression: No anhedonia unusual crying or depressive symptoms  Nutrition: Eats well balanced diet; adequate calcium and vitamin D. No swallowing chewing problems.  Injury: no major injuries in the last six months.  Other healthcare providers:  Reviewed today .  Social:  Lives .hh 1 widow No pets.   Preventive parameters: up-to-date  Reviewed   ADLS:   There are no problems or need for assistance  driving, feeding, obtaining food, dressing, toileting and bathing, managing money using phone. She is independent.  EXERCISE/ HABITS  Reg walks    No tobacco    etoh no limited acaffiene   ROS:  GEN/ HEENT: No fever, significant weight changes sweats headaches vision problems hearing  changes, CV/ PULM; No chest pain shortness of breath cough, syncope,edema  change in exercise tolerance. GI /GU: No adominal pain, vomiting, change in bowel habits. No blood in the stool. No significant GU symptoms. SKIN/HEME: ,no acute skin rashes suspicious lesions or bleeding. No lymphadenopathy, nodules, masses.  NEURO/ PSYCH:  No neurologic signs such as weakness numbness. No depression anxiety. IMM/ Allergy: No unusual infections.  Allergy .   REST of 12 system review negative except as per HPI   Past Medical History  Diagnosis Date  . VITAMIN D DEFICIENCY 06/30/2010  . HYPERLIPIDEMIA 06/30/2010  . HYPERTENSION 06/30/2010  . ALLERGIC RHINITIS 06/30/2010    perennial  . GERD 06/30/2010  . IBS 06/30/2010  . Headache(784.0) 06/30/2010    migraines  . Internal hemorrhoids   . Coughing up blood 08/13/2012    piece of tissue? will send to path poss from upper airway sinusitis  Final path was "abscess"     Family History  Problem Relation Age of Onset  . Colon cancer Mother     age 13  . Arthritis Brother   . Other Brother     History   Social History  . Marital Status: Widowed    Spouse Name: N/A    Number of Children: 2  . Years of Education: N/A   Occupational History  . OFFICE SPECIALIST  Social History Main Topics  . Smoking status: Former Smoker    Quit date: 09/06/1976  . Smokeless tobacco: Never Used  . Alcohol Use: No     Comment: rarely  . Drug Use: No  . Sexual Activity: None   Other Topics Concern  . 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     Outpatient Encounter Prescriptions as of 09/04/2013  Medication Sig  . atenolol (TENORMIN) 25 MG tablet Take 0.5 tablets (12.5 mg total) by mouth daily.  Marland Kitchen esomeprazole (NEXIUM) 40 MG capsule Take 1 capsule (40 mg total)  by mouth daily.  Marland Kitchen ezetimibe (ZETIA) 10 MG tablet Take 1 tablet (10 mg total) by mouth daily.  . fenofibrate micronized (LOFIBRA) 134 MG capsule Take 1 capsule by mouth  daily before breakfast  . hyoscyamine (LEVSIN/SL) 0.125 MG SL tablet Take one po SL BID prn  . Probiotic Product (ALIGN PO) Take by mouth.  . rifaximin (XIFAXAN) 550 MG TABS Take 550 mg by mouth daily.  . rizatriptan (MAXALT) 10 MG tablet TAKE 1 TABLET (10 MG TOTAL) BY MOUTH AS NEEDED FOR MIGRAINE. MAY REPEAT IN 2 HOURS IF NEEDED  . traMADol (ULTRAM) 50 MG tablet TAKE 1 TABLET BY MOUTH EVERY 6 HOURS AS NEEDED FOR PAIN  . valACYclovir (VALTREX) 1000 MG tablet Take 1 tablet (1,000 mg total) by mouth 2 (two) times daily.  . mometasone (NASONEX) 50 MCG/ACT nasal spray Place 2 sprays into the nose daily.  . [DISCONTINUED] hydrocortisone (ANUSOL-HC) 25 MG suppository Place 1 suppository (25 mg total) rectally at bedtime.    EXAM:  BP 114/74  Pulse 80  Temp(Src) 98 F (36.7 C) (Oral)  Wt 169 lb (76.658 kg)  SpO2 98%  Body mass index is 27.82 kg/(m^2).  Physical Exam: Vital signs reviewed NWG:NFAO is a well-developed well-nourished alert cooperative   who appears stated age in no acute distress.  HEENT: normocephalic atraumatic , Eyes: PERRL EOM's full, conjunctiva clear, Nares: paten,t no deformity discharge or tenderness.,minimal congestion  Ears: no deformity EAC's clear TMs with normal landmarks. Mouth: clear OP, no lesions, edema.  Moist mucous membranes. Dentition in adequate repair. NECK: supple without masses, thyromegaly or bruits. CHEST/PULM:  Clear to auscultation and percussion breath sounds equal no wheeze , rales or rhonchi. No chest wall deformities or tenderness. Breast: normal by inspection . No dimpling, discharge, masses, tenderness or discharge . CV: PMI is nondisplaced, S1 S2 no gallops, murmurs, rubs. Peripheral pulses are full without delay.No JVD .lef leg VV and some bruising.  ABDOMEN: Bowel sounds  normal nontender  No guard or rebound, no hepato splenomegal no CVA tenderness.  No hernia. Extremtities:  No clubbing cyanosis or edema, no acute joint swelling or redness no focal atrophy NEURO:  Oriented x3, cranial nerves 3-12 appear to be intact, no obvious focal weakness,gait within normal limits no abnormal reflexes or asymmetrical SKIN: No acute rashes normal turgor, color, no bruising or petechiae. PSYCH: Oriented, good eye contact, no obvious depression anxiety, cognition and judgment appear normal. LN: no cervical axillary inguinal adenopathy No noted deficits in memory, attention, and speech.   Lab Results  Component Value Date   WBC 9.7 09/04/2013   HGB 13.1 09/04/2013  HCT 38.7 09/04/2013   PLT 338.0 09/04/2013   GLUCOSE 88 09/04/2013   CHOL 165 09/04/2013   TRIG 110.0 09/04/2013   HDL 60.90 09/04/2013   LDLCALC 82 09/04/2013   ALT 24 09/04/2013   AST 29 09/04/2013   NA 140 09/04/2013   K 3.9 09/04/2013   CL 104 09/04/2013   CREATININE 0.9 09/04/2013   BUN 15 09/04/2013   CO2 26 09/04/2013   TSH 1.16 09/04/2013   Wt Readings from Last 3 Encounters:  09/04/13 169 lb (76.658 kg)  08/14/13 173 lb (78.472 kg)  06/19/13 173 lb (78.472 kg)    ASSESSMENT AND PLAN:  Discussed the following assessment and plan:  Visit for preventive health examination - Plan: Basic metabolic panel, CBC with Differential, Hepatic function panel, Lipid panel, TSH  HYPERLIPIDEMIA - Plan: Basic metabolic panel, CBC with Differential, Hepatic function panel, Lipid panel, TSH  HYPERTENSION - good control  - Plan: Basic metabolic panel, CBC with Differential, Hepatic function panel, Lipid panel, TSH  GERD - Plan: Basic metabolic panel, CBC with Differential, Hepatic function panel, Lipid panel, TSH  Mild anemia  hx  - hx of same  resolved today  - Plan: Basic metabolic panel, CBC with Differential, Hepatic function panel, Lipid panel, TSH Counseled regarding healthy nutrition,  exercise, sleep, injury prevention, calcium vit d and healthy weight . Doing well with weigh tloss and exercise  Recurrent sinus sx ok today  Check with [place] of dr Juanda Chance office in may if haven't heard about repeat scan. ( also fatty liver) Patient Care Team: [name], MD as PCP - General Hart Carwin, MD as Attending Physician (Gastroenterology)  Patient Instructions  Continue lifestyle intervention healthy eating and exercise . This helps with BP and cholesterol  Will notify you  of labs when available. If ok then yearly check  Fu with   abd scan as planned     Wanda K. Panosh M.D.  Pre visit review using our clinic review tool, if applicable. No additional management support is needed unless otherwise documented below in the visit note.

## 2013-09-04 NOTE — Patient Instructions (Signed)
Continue lifestyle intervention healthy eating and exercise . This helps with BP and cholesterol  Will notify you  of labs when available. If ok then yearly check  Fu with   abd scan as planned

## 2013-09-06 ENCOUNTER — Other Ambulatory Visit: Payer: Self-pay | Admitting: Internal Medicine

## 2013-09-07 ENCOUNTER — Encounter: Payer: Self-pay | Admitting: Family Medicine

## 2013-09-09 ENCOUNTER — Other Ambulatory Visit: Payer: Self-pay | Admitting: Internal Medicine

## 2013-09-11 ENCOUNTER — Other Ambulatory Visit: Payer: Self-pay | Admitting: *Deleted

## 2013-09-11 DIAGNOSIS — I83893 Varicose veins of bilateral lower extremities with other complications: Secondary | ICD-10-CM

## 2013-09-11 NOTE — Telephone Encounter (Signed)
Last filled on 08/06/2013 #30 with 0 additional refills At this time she has no future appt scheduled Was last seen on 09/04/13 for CPE. Please advise. Thanks!

## 2013-09-12 ENCOUNTER — Encounter: Payer: Self-pay | Admitting: Vascular Surgery

## 2013-09-12 NOTE — Telephone Encounter (Signed)
Ok x 1

## 2013-09-13 ENCOUNTER — Encounter: Payer: Self-pay | Admitting: Vascular Surgery

## 2013-09-13 ENCOUNTER — Ambulatory Visit (HOSPITAL_COMMUNITY)
Admission: RE | Admit: 2013-09-13 | Discharge: 2013-09-13 | Disposition: A | Discharge status: Home / Self Care | Payer: Medicare Other | Source: Ambulatory Visit | Attending: Vascular Surgery | Admitting: Vascular Surgery

## 2013-09-13 ENCOUNTER — Ambulatory Visit (INDEPENDENT_AMBULATORY_CARE_PROVIDER_SITE_OTHER): Payer: Medicare Other | Admitting: Vascular Surgery

## 2013-09-13 VITALS — BP 120/73 | HR 80 | Resp 16 | Ht 65.0 in | Wt 167.0 lb

## 2013-09-13 DIAGNOSIS — I83893 Varicose veins of bilateral lower extremities with other complications: Secondary | ICD-10-CM | POA: Insufficient documentation

## 2013-09-13 NOTE — Progress Notes (Signed)
Patient name: Michele Monroe MRN: [id number] DOB: 11/14/1945 Sex: female     Reason for referral:  Chief Complaint  Patient presents with  . Varicose Veins    Left leg pain and swelling    HISTORY OF PRESENT ILLNESS: The patient presents today for evaluation of venous pathology in her left leg. She is well known to me from prior treatment of right leg venous hypertension. In 2009 she had undergone laser ablation of her great and small saphenous vein and stab phlebectomy of multiple tributary varicosities. She has had a very nice durable response in her right leg. She did have some varicosities most particularly in the medial aspect of her distal left thigh which had not been bothering her. Proximally one month ago she had redness pain swelling is associated with this. This has resolved to some degree but she continues to have difficulty. She is here today for further evaluation. She has no history of deep venous thrombosis.  Past Medical History  Diagnosis Date  . VITAMIN D DEFICIENCY 06/30/2010  . HYPERLIPIDEMIA 06/30/2010  . HYPERTENSION 06/30/2010  . ALLERGIC RHINITIS 06/30/2010    perennial  . GERD 06/30/2010  . IBS 06/30/2010  . Headache(784.0) 06/30/2010    migraines  . Internal hemorrhoids   . Coughing up blood 08/13/2012    piece of tissue? will send to path poss from upper airway sinusitis  Final path was "abscess"   . Varicose veins     Past Surgical History  Procedure Laterality Date  . Rotator cuff repair      Rt  . Breast surgery      needle bx left breast  . Feet      surgery  . Varicose veins      History   Social History  . Marital Status: Widowed    Spouse Name: N/A    Number of Children: 2  . Years of Education: N/A   Occupational History  . OFFICE SPECIALIST    Social History Main Topics  . Smoking status: Former Smoker    Types: Cigarettes    Quit date: 09/06/1976  . Smokeless tobacco: Never Used  . Alcohol Use: No     Comment: rarely   . Drug Use: No  . Sexual Activity: Not on file   Other Topics Concern  . 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     Family History  Problem Relation Age of Onset  . Colon cancer Mother     age 67  . Arthritis Brother   . Other Brother     Allergies as of 09/13/2013  . (No Known Allergies)    Current Outpatient Prescriptions on File Prior to Visit  Medication Sig Dispense Refill  . atenolol (TENORMIN) 25 MG tablet TAKE 1 TABLET EVERY DAY AS NEEDED FOR BLOOD PRESSURE/MIGRAINE  90 tablet  3  . esomeprazole (NEXIUM) 40 MG capsule Take 1 capsule (40 mg total) by mouth daily.  90 capsule  1  . ezetimibe (ZETIA) 10 MG tablet Take 1 tablet (10 mg total) by mouth daily.  30 tablet  0  . fenofibrate micronized (LOFIBRA) 134 MG capsule Take 1 capsule by mouth  daily  before breakfast  90 capsule  0  . Probiotic Product (ALIGN PO) Take by mouth.      . rifaximin (XIFAXAN) 550 MG TABS Take 550 mg by mouth daily.      . rizatriptan (MAXALT) 10 MG tablet TAKE 1 TABLET (10 MG TOTAL) BY MOUTH AS NEEDED FOR MIGRAINE. MAY REPEAT IN 2 HOURS IF NEEDED  36 tablet  0  . traMADol (ULTRAM) 50 MG tablet TAKE 1 TABLET BY MOUTH EVERY 6 HOURS AS NEEDED  30 tablet  0  . valACYclovir (VALTREX) 1000 MG tablet Take 1 tablet (1,000 mg total) by mouth 2 (two) times daily.  30 tablet  3  . hyoscyamine (LEVSIN/SL) 0.125 MG SL tablet Take one po SL BID prn  60 tablet  1  . mometasone (NASONEX) 50 MCG/ACT nasal spray Place 2 sprays into the nose daily.  51 g  1   No current facility-administered medications on file prior to visit.     REVIEW OF SYSTEMS:  Positives indicated with an "X"  CARDIOVASCULAR:  [ ]  chest pain   [ ]  chest pressure   [ ]  palpitations   [ ]  orthopnea   [ ]  dyspnea on exertion   [ ]  claudication   [  ] rest pain   [ ]  DVT   [x ] phlebitis PULMONARY:   [ ]  productive cough   [ ]  asthma   [ ]  wheezing NEUROLOGIC:   [ ]  weakness  [ ]  paresthesias  [ ]  aphasia  [ ]  amaurosis  [ ]  dizziness HEMATOLOGIC:   [ ]  bleeding problems   [ ]  clotting disorders MUSCULOSKELETAL:  [ ]  joint pain   [ ]  joint swelling GASTROINTESTINAL: [x ]  blood in stool  [ ]   hematemesis GENITOURINARY:  [ ]   dysuria  [ ]   hematuria PSYCHIATRIC:  [ ]  history of major depression INTEGUMENTARY:  [ ]  rashes  [ ]  ulcers CONSTITUTIONAL:  [ ]  fever   [ ]  chills  PHYSICAL EXAMINATION:  General: The patient is a well-nourished female, in no acute distress. Vital signs are BP 120/73  Pulse 80  Resp 16  Ht 5\' 5"  (1.651 m)  Wt 167 lb (75.751 kg)  BMI 27.79 kg/m2 Pulmonary: There is a good air exchange   Musculoskeletal: There are no major deformities.   Neurologic: No focal weakness or paresthesias are detected, Skin: There are no ulcer or rashes noted. Psychiatric: The patient has normal affect. Pulse status 2+ dorsalis pedis pulses bilaterally Does have scattered telangiectasia over her thighs and calves and down onto her ankles bilaterally. Right leg has no evidence of recurrent venous pathology. Left leg as the obvious superficial thrombophlebitis and varicosities on the distal thigh and in the medial knee.   [facility] Vascular Lab Studies:  Ordered and Independently Reviewed this reveals reflux in her great aunt small saphenous vein on the left leg.  Impression and Plan:  Superficial thrombophlebitis and varicosities secondary to saphenous vein reflux on her left leg with thrombophlebitis in the medial knee and distal thigh area. I discussed this at length with the patient. I at this is not a dangerous situation is not good or any increased risk for DVT. This is resolving and I explained the importance of elevation and ibuprofen and time. I explained that the recurrent episodes of thrombophlebitis is a relative indication  to proceed with treatment of her saphenous varicosities. She is not having any pain associated with these and therefore comfortable continuing to  monitor this. She is not having any swelling and distal pain in his comfortable with conservative observation only. She'll notify should she develop any difficulties. I explained that he'll take several months for complete resolution of the thrombophlebitis.    Alexee Delsanto [facility] Office: [phone]

## 2013-09-28 ENCOUNTER — Encounter: Payer: Self-pay | Admitting: Internal Medicine

## 2013-10-17 ENCOUNTER — Other Ambulatory Visit: Payer: Self-pay | Admitting: Internal Medicine

## 2013-11-04 ENCOUNTER — Encounter: Payer: Self-pay | Admitting: Internal Medicine

## 2013-11-05 ENCOUNTER — Telehealth: Payer: Self-pay | Admitting: Internal Medicine

## 2013-11-05 NOTE — Telephone Encounter (Signed)
Pt would like refill of ezetimibe (ZETIA) 10 MG tablet.  Pt would like to make sure she gets the generic brand. Pt thinks she is getting name brand on all her meds. Wants to get generic w/ all her meds. [facility]   Pt would like to know if she should get screened for plague build up in arteries Should she take a baby aspirin daily.

## 2013-11-06 NOTE — Telephone Encounter (Signed)
I am not aware of a generic  Please check to see if so   Ok to refill through next December .  screening for no sx is not  advise d. Statin medications are mosre likely to help plaque build up than zetia.  Does she want to take  One of these meds such as pravastatin lipitor simvastatin?  May want to make appt to discuss otherwise

## 2013-11-07 NOTE — Telephone Encounter (Signed)
i advise trying off of the lofibra   In addition to the zetia   Then begin generic lipitor 20 mg per day disp 30 refill x 3  Lipid panel in 2-3 months to see how this works

## 2013-11-07 NOTE — Telephone Encounter (Signed)
Spoke to the pt.  She would like to try a statin and she will be picking up samples of Zetia to hold her over.  Advised that I will send the new prescription to the pharmacy so she can pick up when she is ready.  Will have them hold it until she calls.  Please advise new med.  Thanks!

## 2013-11-08 NOTE — Telephone Encounter (Signed)
Ok to take with zetia but  Uncertain why she needs to do this. Potential minimal risk of interaction with lipitor  Again she may not need this med .

## 2013-11-08 NOTE — Telephone Encounter (Signed)
Pt notified.  She has made future lab appt and follow up with WP.

## 2013-11-08 NOTE — Telephone Encounter (Signed)
Spoke to the pt and advise that she not take the [place] with the Zetia.  She would like to know why. Should she take the [place] with the Lipitor? Please advise.  Thanks!

## 2013-11-12 ENCOUNTER — Other Ambulatory Visit: Payer: Self-pay | Admitting: Family Medicine

## 2013-11-12 MED ORDER — ATORVASTATIN CALCIUM 20 MG PO TABS: 20.0000 mg | ORAL_TABLET | Freq: Every day | ORAL | Status: DC

## 2013-12-12 ENCOUNTER — Other Ambulatory Visit: Payer: Self-pay | Admitting: Internal Medicine

## 2013-12-13 NOTE — Telephone Encounter (Signed)
Ok to refill x 1  

## 2013-12-17 ENCOUNTER — Telehealth: Payer: Self-pay | Admitting: Internal Medicine

## 2013-12-17 NOTE — Telephone Encounter (Signed)
Patient Information:  Caller Name: Janetta  Phone: [phone]  Patient: Michele Monroe  Gender: Female  DOB: 25-Oct-1945  Age: 68 Years  PCP: Shanon Ace (Family Practice)  Office Follow Up:  Does the office need to follow up with this patient?: No  Instructions For The Office: N/A  RN Note:  Pt was seen at [facility] on 12/15/13 for a tick bite. She removed the tick on 12/03/13 but the site was red and a bit swollen. She's waiting on test results that should be back today. She simply wanted to let Dr. Regis Bill know what is going on.  Symptoms  Reason For Call & Symptoms: Tick bite  Reviewed Health History In EMR: Yes  Reviewed Medications In EMR: Yes  Reviewed Allergies In EMR: Yes  Reviewed Surgeries / Procedures: Yes  Date of Onset of Symptoms: 12/03/2013  Guideline(s) Used:  Tick Bite  Disposition Per Guideline:   Home Care  Reason For Disposition Reached:   Tick bite with no complications  Advice Given:  Call Back If:  Fever or rash occur in the next 2 weeks  You become worse.  Patient Will Follow Care Advice:  YES

## 2013-12-18 NOTE — Telephone Encounter (Signed)
Please have  Copies of evaluation and lab results sent to [place] also.

## 2013-12-18 NOTE — Telephone Encounter (Signed)
Pt aware to send results to [place]

## 2014-01-21 ENCOUNTER — Other Ambulatory Visit (INDEPENDENT_AMBULATORY_CARE_PROVIDER_SITE_OTHER): Payer: Medicare Other

## 2014-01-21 DIAGNOSIS — E785 Hyperlipidemia, unspecified: Secondary | ICD-10-CM

## 2014-01-21 LAB — LIPID PANEL
CHOL/HDL RATIO: 3
Cholesterol: 142 mg/dL (ref 0–200)
HDL: 55.6 mg/dL (ref >39.00)
LDL Cholesterol: 63 mg/dL (ref 0–99)
Triglycerides: 117 mg/dL (ref 0.0–149.0)
VLDL: 23.4 mg/dL (ref 0.0–40.0)

## 2014-01-29 ENCOUNTER — Encounter: Payer: Medicare Other | Admitting: Internal Medicine

## 2014-01-29 ENCOUNTER — Encounter: Payer: Self-pay | Admitting: Internal Medicine

## 2014-01-29 NOTE — Progress Notes (Signed)
Document opened and reviewed for OV but appt  canceled same day .  

## 2014-01-30 ENCOUNTER — Telehealth: Payer: Self-pay | Admitting: Internal Medicine

## 2014-01-30 NOTE — Telephone Encounter (Signed)
Relevant patient education mailed to patient.  

## 2014-02-05 ENCOUNTER — Telehealth: Payer: Self-pay | Admitting: *Deleted

## 2014-02-05 DIAGNOSIS — R935 Abnormal findings on diagnostic imaging of other abdominal regions, including retroperitoneum: Secondary | ICD-10-CM

## 2014-02-05 NOTE — Telephone Encounter (Signed)
Message copied by Hulan Saas on Tue Feb 05, 2014 11:05 AM ------      Message from: Hulan Saas      Created: Mon Aug 20, 2013  2:43 PM       Schedule for 6 month f/u ultrasound abdomen for f/u small densities in both kidneys ------

## 2014-02-05 NOTE — Telephone Encounter (Signed)
Scheduled US abdomen at [facility] radiology on 02/13/14 at 8:30 AM.  NPO 6 hours prior.(Michele Monroe) Left a message for patient to call me.

## 2014-02-06 ENCOUNTER — Other Ambulatory Visit: Payer: Self-pay | Admitting: Internal Medicine

## 2014-02-06 NOTE — Telephone Encounter (Signed)
Patient notified of appointment date, time and instructions.

## 2014-02-08 ENCOUNTER — Other Ambulatory Visit: Payer: Self-pay | Admitting: Internal Medicine

## 2014-02-08 ENCOUNTER — Encounter: Payer: Self-pay | Admitting: Internal Medicine

## 2014-02-08 ENCOUNTER — Ambulatory Visit (INDEPENDENT_AMBULATORY_CARE_PROVIDER_SITE_OTHER): Payer: Medicare Other | Admitting: Internal Medicine

## 2014-02-08 VITALS — BP 128/84 | Temp 98.2°F | Ht 65.0 in | Wt 175.0 lb

## 2014-02-08 DIAGNOSIS — E785 Hyperlipidemia, unspecified: Secondary | ICD-10-CM

## 2014-02-08 DIAGNOSIS — M542 Cervicalgia: Secondary | ICD-10-CM

## 2014-02-08 DIAGNOSIS — I10 Essential (primary) hypertension: Secondary | ICD-10-CM

## 2014-02-08 DIAGNOSIS — J309 Allergic rhinitis, unspecified: Secondary | ICD-10-CM | POA: Insufficient documentation

## 2014-02-08 MED ORDER — TRAMADOL HCL 50 MG PO TABS: ORAL_TABLET | ORAL | Status: DC

## 2014-02-08 MED ORDER — MELOXICAM 15 MG PO TABS: 7.5000 mg | ORAL_TABLET | Freq: Every day | ORAL | Status: DC

## 2014-02-08 MED ORDER — ATORVASTATIN CALCIUM 20 MG PO TABS: 20.0000 mg | ORAL_TABLET | Freq: Every day | ORAL | Status: DC

## 2014-02-08 NOTE — Progress Notes (Signed)
Chief Complaint  Patient presents with  . Follow-up    pain med neck pain  . Hyperlipidemia    HPI: Fu  Medical problems   LIPIDS; change to atorva off lofibra and zetia  No se of lipitor at this time   Neck acupuncture  and massages  Neck hurts all the time  Tight and hard to lossen arm  Left  May have hurt it in swim exercise class  ? What to take   About 2 per day tramadol when needs it  on days of problem .   allergies ans sinuses  Flaring no  Fever  ROS: See pertinent positives and negatives per HPI.No fever cp sob    Past Medical History  Diagnosis Date  . VITAMIN D DEFICIENCY 06/30/2010  . HYPERLIPIDEMIA 06/30/2010  . HYPERTENSION 06/30/2010  . ALLERGIC RHINITIS 06/30/2010    perennial  . GERD 06/30/2010  . IBS 06/30/2010  . Headache(784.0) 06/30/2010    migraines  . Internal hemorrhoids   . Coughing up blood 08/13/2012    piece of tissue? will send to path poss from upper airway sinusitis  Final path was "abscess"   . Varicose veins     Family History  Problem Relation Age of Onset  . Colon cancer Mother     age 47  . Arthritis Brother   . Other Brother     History   Social History  . Marital Status: Widowed    Spouse Name: N/A    Number of Children: 2  . Years of Education: N/A   Occupational History  . OFFICE SPECIALIST    Social History Main Topics  . Smoking status: Former Smoker    Types: Cigarettes    Quit date: 09/06/1976  . Smokeless tobacco: Never Used  . Alcohol Use: No     Comment: rarely  . Drug Use: No  . Sexual Activity: None   Other Topics Concern  . 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     Outpatient Encounter Prescriptions as of 02/08/2014  Medication Sig  . atenolol (TENORMIN) 25 MG tablet TAKE 1 TABLET EVERY DAY AS NEEDED FOR BLOOD  PRESSURE/MIGRAINE  . atorvastatin (LIPITOR) 20 MG tablet Take 1 tablet (20 mg total) by mouth daily.  . rizatriptan (MAXALT) 10 MG tablet TAKE 1 TABLET (10 MG TOTAL) BY MOUTH AS NEEDED FOR MIGRAINE. MAY REPEAT IN 2 HOURS IF NEEDED  . traMADol (ULTRAM) 50 MG tablet TAKE 1 TABLET BY MOUTH EVERY 6 HOURS AS NEEDED for pain  . valACYclovir (VALTREX) 1000 MG tablet Take 1 tablet (1,000 mg total) by mouth 2 (two) times daily.  . [DISCONTINUED] atorvastatin (LIPITOR) 20 MG tablet Take 1 tablet (20 mg total) by mouth daily.  . [DISCONTINUED] esomeprazole (NEXIUM) 40 MG capsule Take 1 capsule (40 mg total) by mouth daily.  . [DISCONTINUED] traMADol (ULTRAM) 50 MG tablet TAKE 1 TABLET BY MOUTH EVERY 6 HOURS AS NEEDED  . meloxicam (MOBIC) 15 MG tablet Take 0.5-1 tablets (7.5-15 mg total) by mouth daily. For pain  . [DISCONTINUED] ezetimibe (ZETIA) 10 MG tablet Take 1 tablet (10 mg total) by mouth daily.  . [DISCONTINUED] fenofibrate micronized (LOFIBRA) 134 MG capsule Take 1  capsule by mouth  daily before breakfast  . [DISCONTINUED] fenofibrate micronized (LOFIBRA) 134 MG capsule TAKE 1 CAPSULE (134 MG TOTAL) BY MOUTH DAILY BEFORE BREAKFAST.  . [DISCONTINUED] hyoscyamine (LEVSIN/SL) 0.125 MG SL tablet Take one po SL BID prn  . [DISCONTINUED] mometasone (NASONEX) 50 MCG/ACT nasal spray Place 2 sprays into the nose daily.  . [DISCONTINUED] Probiotic Product (ALIGN PO) Take by mouth.  . [DISCONTINUED] rifaximin (XIFAXAN) 550 MG TABS Take 550 mg by mouth daily.    EXAM:  BP 128/84  Temp(Src) 98.2 F (36.8 C) (Oral)  Ht 5\' 5"  (1.651 m)  Wt 175 lb (79.379 kg)  BMI 29.12 kg/m2  Body mass index is 29.12 kg/(m^2).  GENERAL: vitals reviewed and listed above, alert, oriented, appears well hydrated and in no acute distress mild congestion HEENT: atraumatic, conjunctiva  clear, no obvious abnormalities on inspection of external nose and ears OP : no lesion edema or exudate  NECK: no obvious masses on  inspection palpation  Tight muscle right paraspinal  Good rom  LUNGS: clear to auscultation bilaterally, no wheezes, rales or rhonchi, good air movement CV: HRRR, no clubbing cyanosis or  peripheral edema nl cap refill  MS: moves all extremities without noticeable focal  abnormality PSYCH: pleasant and cooperative, no obvious depression or anxiety Lab Results  Component Value Date   WBC 9.7 09/04/2013   HGB 13.1 09/04/2013   HCT 38.7 09/04/2013   PLT 338.0 09/04/2013   GLUCOSE 88 09/04/2013   CHOL 142 01/21/2014   TRIG 117.0 01/21/2014   HDL 55.60 01/21/2014   LDLCALC 63 01/21/2014   ALT 24 09/04/2013   AST 29 09/04/2013   NA 140 09/04/2013   K 3.9 09/04/2013   CL 104 09/04/2013   CREATININE 0.9 09/04/2013   BUN 15 09/04/2013   CO2 26 09/04/2013   TSH 1.16 09/04/2013    ASSESSMENT AND PLAN:  Discussed the following assessment and plan:  HYPERLIPIDEMIA - good on atorva alone continue  HYPERTENSION - good readings  cont lsi   Neck pain on left side - mesc spasm agina  disc rx optins exercise risk benfit short term melozxicam with gi care   Allergic rhinitis ? - no complication noted today chest is clear hs of recurrent sinus infections Total visit 41mins > 50% spent counseling and coordinating care regarding meds and plan fo fu  -Patient advised to return or notify health care team  if symptoms worsen ,persist or new concerns arise.  Patient Instructions  Stay on the lipitor  Labs are good. Pain pill as needed.  Try short course of  Antiinflammatory   With food on stomach to see if helps your pain calm down .  Continue exercise  Preventive visit cpx and labs in  6 months or thereabout .   [name]. Panosh M.D. Pre visit review using our clinic review tool, if applicable. No additional management support is needed unless otherwise documented below in the visit note.

## 2014-02-08 NOTE — Telephone Encounter (Signed)
Sent to the pharmacy by e-scribe. 

## 2014-02-08 NOTE — Patient Instructions (Addendum)
Stay on the lipitor  Labs are good. Pain pill as needed.  Try short course of  Antiinflammatory   With food on stomach to see if helps your pain calm down .  Continue exercise  Preventive visit cpx and labs in  6 months or thereabout .

## 2014-02-11 ENCOUNTER — Telehealth: Payer: Self-pay | Admitting: Internal Medicine

## 2014-02-11 NOTE — Telephone Encounter (Signed)
Relevant patient education assigned to patient using Emmi. ° °

## 2014-02-13 ENCOUNTER — Other Ambulatory Visit: Payer: Self-pay | Admitting: Internal Medicine

## 2014-02-13 ENCOUNTER — Ambulatory Visit (HOSPITAL_COMMUNITY)
Admission: RE | Admit: 2014-02-13 | Discharge: 2014-02-13 | Disposition: A | Discharge status: Home / Self Care | Payer: Medicare Other | Source: Ambulatory Visit | Attending: Internal Medicine | Admitting: Internal Medicine

## 2014-02-13 DIAGNOSIS — R935 Abnormal findings on diagnostic imaging of other abdominal regions, including retroperitoneum: Secondary | ICD-10-CM

## 2014-02-13 DIAGNOSIS — N289 Disorder of kidney and ureter, unspecified: Secondary | ICD-10-CM | POA: Insufficient documentation

## 2014-02-14 ENCOUNTER — Other Ambulatory Visit: Payer: Self-pay | Admitting: Internal Medicine

## 2014-02-15 NOTE — Telephone Encounter (Signed)
Sent to the pharmacy by e-scribe. 

## 2014-06-02 ENCOUNTER — Other Ambulatory Visit: Payer: Self-pay | Admitting: Internal Medicine

## 2014-06-04 NOTE — Telephone Encounter (Signed)
Sent to the pharmacy by e-scribe. 

## 2014-06-04 NOTE — Telephone Encounter (Signed)
Ok to refill x 1  

## 2014-06-10 ENCOUNTER — Telehealth: Payer: Self-pay | Admitting: Internal Medicine

## 2014-06-10 NOTE — Telephone Encounter (Signed)
Noted.  Will hold the note and check on the pt 06/11/14 to see if better or needs an appt.

## 2014-06-10 NOTE — Telephone Encounter (Signed)
Patient Information:  Caller Name: Bevely  Phone: [phone]  Patient: Michele Monroe  Gender: Female  DOB: 05-21-1946  Age: 68 Years  PCP: Shanon Ace (Family Practice)  Office Follow Up:  Does the office need to follow up with this patient?: No  Instructions For The Office: N/A  RN Note:  Care Advice per Guidelines. Call back parameters. Schedule an OV if her tongue does not improve.  Symptoms  Reason For Call & Symptoms: Onset on the end of tongue of bumps and diarrhea.No vomiting. No fever. Started suddenly and has abdominal pain before the diarrhea. Has been around a sick granddaughter and went to a shower before she got sick where lots of children were present.  Reviewed Health History In EMR: Yes  Reviewed Medications In EMR: Yes  Reviewed Allergies In EMR: Yes  Reviewed Surgeries / Procedures: Yes  Date of Onset of Symptoms: 06/09/2014  Guideline(s) Used:  Diarrhea  Disposition Per Guideline:   Home Care  Reason For Disposition Reached:   Mild diarrhea  Advice Given:  Reassurance:  In healthy adults, new-onset diarrhea is usually caused by a viral infection of the intestines, which you can treat at home. Diarrhea is the body's way of getting rid of the infection. Here are some tips on how to keep ahead of the fluid losses.  Here is some care advice that should help.  Fluids:  Drink more fluids, at least 8-10 glasses (8 oz or 240 ml) daily.  For example: sports drinks, diluted fruit juices, soft drinks.  Supplement this with saltine crackers or soups to make certain that you are getting sufficient fluid and salt to meet your body's needs.  Avoid caffeinated beverages (Reason: caffeine is mildly dehydrating).  Nutrition:  Maintaining some food intake during episodes of diarrhea is important.  Ideal initial foods include boiled starches/cereals (e.g., potatoes, rice, noodles, wheat, oats) with a small amount of salt to taste.  Other acceptable foods include: bananas,  yogurt, crackers, soup.  As your stools return to normal consistency, resume a normal diet.  Diarrhea Medication  - Imodium AD:   Helps reduce diarrhea.  Adult dosage: 4 mg (2 capsules or 4 teaspoons or 20 ml) is the recommended first dose. You may take an additional 2 mg (1 capsule or 2 teaspoons or 10 ml) after each loose BM.  Expected Course:  Viral diarrhea lasts 4-7 days. Always worse on days 1 and 2.  Call Back If:  Signs of dehydration occur (e.g., no urine for more than 12 hours, very dry mouth, lightheaded, etc.)  Diarrhea lasts over 7 days  You become worse.  Patient Will Follow Care Advice:  YES

## 2014-06-11 NOTE — Telephone Encounter (Signed)
Spoke to the pt and she stated she is much better today.  Stated she was at a baby shower this past Saturday and may have caught a 24 hour bug.  Instructed her to call back if begins to feel bad again or diarrhea restarts.

## 2014-06-21 ENCOUNTER — Other Ambulatory Visit: Payer: Self-pay | Admitting: Internal Medicine

## 2014-06-21 NOTE — Telephone Encounter (Signed)
Called to the pharmacy and left on voicemail. 

## 2014-06-21 NOTE — Telephone Encounter (Signed)
Ok x 2  

## 2014-07-15 ENCOUNTER — Other Ambulatory Visit: Payer: Self-pay | Admitting: Internal Medicine

## 2014-07-15 NOTE — Telephone Encounter (Signed)
Sent to the pharmacy by e-scribe.  Pt has upcoming appt on 08/09/14

## 2014-07-25 ENCOUNTER — Other Ambulatory Visit: Payer: Self-pay | Admitting: Internal Medicine

## 2014-07-25 DIAGNOSIS — Z1231 Encounter for screening mammogram for malignant neoplasm of breast: Secondary | ICD-10-CM

## 2014-08-09 ENCOUNTER — Encounter: Payer: Self-pay | Admitting: Internal Medicine

## 2014-08-09 ENCOUNTER — Ambulatory Visit (INDEPENDENT_AMBULATORY_CARE_PROVIDER_SITE_OTHER): Payer: Medicare Other | Admitting: Internal Medicine

## 2014-08-09 VITALS — BP 116/62 | Temp 97.9°F | Ht 65.0 in | Wt 177.1 lb

## 2014-08-09 DIAGNOSIS — R519 Headache, unspecified: Secondary | ICD-10-CM

## 2014-08-09 DIAGNOSIS — E785 Hyperlipidemia, unspecified: Secondary | ICD-10-CM

## 2014-08-09 DIAGNOSIS — N289 Disorder of kidney and ureter, unspecified: Secondary | ICD-10-CM

## 2014-08-09 DIAGNOSIS — I10 Essential (primary) hypertension: Secondary | ICD-10-CM

## 2014-08-09 DIAGNOSIS — R51 Headache: Secondary | ICD-10-CM

## 2014-08-09 MED ORDER — TRAMADOL HCL 50 MG PO TABS: 50.0000 mg | ORAL_TABLET | Freq: Four times a day (QID) | ORAL | Status: DC | PRN

## 2014-08-09 NOTE — Patient Instructions (Signed)
Agree with seeing   Headache specialist .     continue same at this time.  Immunizations are up to  date Lab Results  Component Value Date   CHOL 142 01/21/2014   HDL 55.60 01/21/2014   LDLCALC 63 01/21/2014   TRIG 117.0 01/21/2014   CHOLHDL 3 01/21/2014   Plan wellness visit with labs in 6 months  Should have fu ultrasound for the kidney  Nodule as advised by radiologist ( prob benign )

## 2014-08-09 NOTE — Progress Notes (Signed)
Pre visit review using our clinic review tool, if applicable. No additional management support is needed unless otherwise documented below in the visit note.   Chief Complaint  Patient presents with  . Follow-up    Med Check.  Tramadol not helping the pain.    HPI: EHR was down at time of visit so reconstructed visit  HAs  From neck and tight muscles  Getting more frequent and some nocturnal has  Takes tramadol and maxalt some help  Made appt with HA and wellness  Jan 26  Lipids on liptor now for almost a year no  Se of med  To get mammo dec 17  No cv plm sx noted  ROS: See pertinent positives and negatives per HPI.  Past Medical History  Diagnosis Date  . VITAMIN D DEFICIENCY 06/30/2010  . HYPERLIPIDEMIA 06/30/2010  . HYPERTENSION 06/30/2010  . ALLERGIC RHINITIS 06/30/2010    perennial  . GERD 06/30/2010  . IBS 06/30/2010  . Headache(784.0) 06/30/2010    migraines  . Internal hemorrhoids   . Coughing up blood 08/13/2012    piece of tissue? will send to path poss from upper airway sinusitis  Final path was "abscess"   . Varicose veins     Family History  Problem Relation Age of Onset  . Colon cancer Mother     age 67  . Arthritis Brother   . Other Brother     History   Social History  . Marital Status: Widowed    Spouse Name: N/A    Number of Children: 2  . Years of Education: N/A   Occupational History  . OFFICE SPECIALIST    Social History Main Topics  . Smoking status: Former Smoker    Types: Cigarettes    Quit date: 09/06/1976  . Smokeless tobacco: Never Used  . Alcohol Use: No     Comment: rarely  . Drug Use: No  . Sexual Activity: None   Other Topics Concern  . 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     Outpatient Encounter Prescriptions as of 08/09/2014   Medication Sig  . atenolol (TENORMIN) 25 MG tablet TAKE 1 TABLET EVERY DAY AS NEEDED FOR BLOOD PRESSURE/MIGRAINE  . atorvastatin (LIPITOR) 20 MG tablet Take 1 tablet (20 mg total) by mouth daily.  Marland Kitchen NEXIUM 40 MG capsule TAKE 1 CAPSULE (40 MG TOTAL) BY MOUTH DAILY.  . rizatriptan (MAXALT) 10 MG tablet TAKE 1 TABLET (10 MG TOTAL) BY MOUTH AS NEEDED FOR MIGRAINE. MAY REPEAT IN 2 HOURS IF NEEDED  . traMADol (ULTRAM) 50 MG tablet Take 1 tablet (50 mg total) by mouth every 6 (six) hours as needed. for pain  . valACYclovir (VALTREX) 1000 MG tablet Take 1 tablet (1,000 mg total) by mouth 2 (two) times daily.  . [DISCONTINUED] traMADol (ULTRAM) 50 MG tablet TAKE 1 TABLET EVERY 6 HOURS AS NEEDED FOR PAIN  . [DISCONTINUED] meloxicam (MOBIC) 15 MG tablet Take 0.5-1 tablets (7.5-15 mg total) by mouth daily. For pain  . [DISCONTINUED] rizatriptan (MAXALT) 10 MG tablet TAKE 1 TABLET (10 MG TOTAL) BY MOUTH AS NEEDED FOR MIGRAINE. MAY REPEAT IN 2 HOURS IF NEEDED    EXAM:  BP 116/62 mmHg  Temp(Src) 97.9 F (36.6 C) (Oral)  Ht 5\' 5"  (1.651 m)  Wt 177 lb 1.6 oz (80.332 kg)  BMI 29.47 kg/m2  Body mass index is 29.47 kg/(m^2).  GENERAL: vitals reviewed and listed above, alert, oriented, appears well hydrated and in no acute distress HEENT: atraumatic, conjunctiva  clear, no obvious abnormalities on inspection of external nose and ears OP : no lesion edema or exudate  NECK: no obvious masses on inspection palpation  LUNGS: clear to auscultation bilaterally, no wheezes, rales or rhonchi, good air movement CV: HRRR, no clubbing cyanosis or  peripheral edema nl cap refill  MS: moves all extremities without noticeable focal  abnormality PSYCH: pleasant and cooperative, no obvious depression or anxiety  ASSESSMENT AND PLAN:  Discussed the following assessment and plan:  Essential hypertension - controlled   Hyperlipidemia - no se of med   Recurrent headache - agree with HA consult  some migrain some  cervical location rx tramadol for now  Renal lesion - indidental on abd [place]   should repeat in june 16 to ensure  stabiilty - Plan: US Renal  -Patient advised to return or notify health care team  if symptoms worsen ,persist or new concerns arise.  Patient Instructions   Agree with seeing   Headache specialist .     continue same at this time.  Immunizations are up to  date Lab Results  Component Value Date   CHOL 142 01/21/2014   HDL 55.60 01/21/2014   LDLCALC 63 01/21/2014   TRIG 117.0 01/21/2014   CHOLHDL 3 01/21/2014   Plan wellness visit with labs in 6 months  Should have fu ultrasound for the kidney  Nodule as advised by radiologist ( prob benign )    [name]. Panosh M.D. IMPRESSION: 1. 8 mm hyperechoic lesion in the interpolar left kidney is unchanged and may represent an angiomyolipoma. As some renal cell carcinomas can be hyperechoic on ultrasound, continued sonographic follow-up in 1 year is recommended. Alternatively, if a more aggressive approach is desired, MR abdomen without and with contrast is recommended. 2. 5 mm hyperechoic lesion in the lower pole right kidney, not definitely seen on the prior exam, possibly due to size or technique. Again, an angiomyolipoma is favored but a small renal cell carcinoma cannot be excluded.   Electronically Signed  By: Lorin Picket M.D.  On: 02/13/2014 09:24      Result Notes    Total visit 70mins > 50% spent counseling and coordinating care

## 2014-08-10 ENCOUNTER — Other Ambulatory Visit: Payer: Self-pay | Admitting: Internal Medicine

## 2014-08-10 DIAGNOSIS — E785 Hyperlipidemia, unspecified: Secondary | ICD-10-CM | POA: Insufficient documentation

## 2014-08-10 DIAGNOSIS — N289 Disorder of kidney and ureter, unspecified: Secondary | ICD-10-CM | POA: Insufficient documentation

## 2014-08-10 DIAGNOSIS — R51 Headache: Secondary | ICD-10-CM

## 2014-08-10 DIAGNOSIS — I10 Essential (primary) hypertension: Secondary | ICD-10-CM | POA: Insufficient documentation

## 2014-08-10 DIAGNOSIS — R519 Headache, unspecified: Secondary | ICD-10-CM | POA: Insufficient documentation

## 2014-08-12 NOTE — Telephone Encounter (Signed)
Sent to the pharmacy by e-scribe. 

## 2014-08-15 ENCOUNTER — Ambulatory Visit
Admission: RE | Admit: 2014-08-15 | Discharge: 2014-08-15 | Disposition: A | Discharge status: Home / Self Care | Payer: Medicare Other | Source: Ambulatory Visit | Attending: Internal Medicine | Admitting: Internal Medicine

## 2014-08-15 DIAGNOSIS — N289 Disorder of kidney and ureter, unspecified: Secondary | ICD-10-CM

## 2014-08-20 ENCOUNTER — Telehealth: Payer: Self-pay | Admitting: Internal Medicine

## 2014-08-20 DIAGNOSIS — R140 Abdominal distension (gaseous): Secondary | ICD-10-CM

## 2014-08-20 DIAGNOSIS — K5900 Constipation, unspecified: Secondary | ICD-10-CM

## 2014-08-20 DIAGNOSIS — R197 Diarrhea, unspecified: Secondary | ICD-10-CM

## 2014-08-20 NOTE — Telephone Encounter (Signed)
Pt notified of normal [place] results.  No need for follow up.  Pt wanted Dr. Regis Bill to know that she continues to have abdominal pain.  Has diarrhea and constipation.  Last episode was diarrhea that lasted all day with blood.  Blood was described as bright red.  Denied any fever or other sx.  Has tried to control with a bland diet but that no longer seems to be helping.  Please advise.  Thanks!

## 2014-08-20 NOTE — Telephone Encounter (Signed)
Please have help her get appt with gi .

## 2014-08-20 NOTE — Telephone Encounter (Signed)
Pt would like a call back about her US results  °

## 2014-08-21 NOTE — Telephone Encounter (Signed)
Spoke to the pt.  She has seen Dr. Olevia Perches in the past.  Will refer back to her.

## 2014-08-22 ENCOUNTER — Ambulatory Visit (HOSPITAL_COMMUNITY)
Admission: RE | Admit: 2014-08-22 | Discharge: 2014-08-22 | Disposition: A | Discharge status: Home / Self Care | Payer: Medicare Other | Source: Ambulatory Visit | Attending: Internal Medicine | Admitting: Internal Medicine

## 2014-08-22 DIAGNOSIS — Z1231 Encounter for screening mammogram for malignant neoplasm of breast: Secondary | ICD-10-CM | POA: Diagnosis present

## 2014-08-23 ENCOUNTER — Encounter: Payer: Self-pay | Admitting: *Deleted

## 2014-09-04 ENCOUNTER — Telehealth: Payer: Self-pay | Admitting: Internal Medicine

## 2014-09-04 ENCOUNTER — Ambulatory Visit: Payer: Medicare Other | Admitting: Internal Medicine

## 2014-09-04 NOTE — Telephone Encounter (Signed)
No charge. 

## 2014-09-04 NOTE — Telephone Encounter (Signed)
Patient did not reschedule. Dr Olevia Perches, do you want to charge late cancellation fee?

## 2014-09-06 DIAGNOSIS — J22 Unspecified acute lower respiratory infection: Secondary | ICD-10-CM | POA: Diagnosis not present

## 2014-09-06 DIAGNOSIS — R5383 Other fatigue: Secondary | ICD-10-CM | POA: Diagnosis not present

## 2014-09-06 DIAGNOSIS — J019 Acute sinusitis, unspecified: Secondary | ICD-10-CM | POA: Diagnosis not present

## 2014-09-06 HISTORY — PX: POLYPECTOMY: SHX149

## 2014-09-06 HISTORY — PX: COLONOSCOPY: SHX174

## 2014-10-11 ENCOUNTER — Encounter: Payer: Self-pay | Admitting: Internal Medicine

## 2014-11-05 ENCOUNTER — Other Ambulatory Visit: Payer: Self-pay | Admitting: Internal Medicine

## 2014-11-05 NOTE — Telephone Encounter (Signed)
Sent to the pharmacy by e-scribe. 

## 2014-11-06 ENCOUNTER — Encounter: Payer: Self-pay | Admitting: Internal Medicine

## 2014-11-06 ENCOUNTER — Ambulatory Visit (INDEPENDENT_AMBULATORY_CARE_PROVIDER_SITE_OTHER): Payer: Medicare Other | Admitting: Internal Medicine

## 2014-11-06 VITALS — BP 116/70 | Temp 98.3°F | Ht 65.0 in | Wt 177.3 lb

## 2014-11-06 DIAGNOSIS — J0191 Acute recurrent sinusitis, unspecified: Secondary | ICD-10-CM | POA: Diagnosis not present

## 2014-11-06 DIAGNOSIS — R140 Abdominal distension (gaseous): Secondary | ICD-10-CM | POA: Diagnosis not present

## 2014-11-06 DIAGNOSIS — Z79899 Other long term (current) drug therapy: Secondary | ICD-10-CM | POA: Diagnosis not present

## 2014-11-06 DIAGNOSIS — E785 Hyperlipidemia, unspecified: Secondary | ICD-10-CM | POA: Diagnosis not present

## 2014-11-06 MED ORDER — TRAMADOL HCL 50 MG PO TABS: 50.0000 mg | ORAL_TABLET | Freq: Four times a day (QID) | ORAL | Status: DC | PRN

## 2014-11-06 MED ORDER — AMOXICILLIN-POT CLAVULANATE 875-125 MG PO TABS: 1.0000 | ORAL_TABLET | Freq: Two times a day (BID) | ORAL | Status: DC

## 2014-11-06 NOTE — Progress Notes (Signed)
Pre visit review using our clinic review tool, if applicable. No additional management support is needed unless otherwise documented below in the visit note.   Chief Complaint  Patient presents with  . Cough    Started last week  . Sore Throat  . Nasal Congestion    HPI: Michele Monroe 69 y.o.     About 2 weeks   And battling   Sinus drainage  Throat clearing no fever .   No blood  yellow and green  Now has sore throat.  rx in jan and got better with augmentin  At [facility] now back  ROS: See pertinent positives and negatives per HPI. No fever  tinnitus in left ear for a long time  Hearing is fine  No pain  ? tmj  Also asks refill for tramadol for pain joint as needed  Anywhere for 6- 0 times per week Presents screening form from [place]  Needs refill atorva  No se feeling well with this  Past Medical History  Diagnosis Date  . VITAMIN D DEFICIENCY 06/30/2010  . HYPERLIPIDEMIA 06/30/2010  . HYPERTENSION 06/30/2010  . ALLERGIC RHINITIS 06/30/2010    perennial  . GERD 06/30/2010  . IBS 06/30/2010  . Headache(784.0) 06/30/2010    migraines  . Internal hemorrhoids   . Coughing up blood 08/13/2012    piece of tissue? will send to path poss from upper airway sinusitis  Final path was "abscess"   . Varicose veins   . Hepatic steatosis   . Renal lesion     Family History  Problem Relation Age of Onset  . Colon cancer Mother     age 35  . Arthritis Brother   . Other Brother     History   Social History  . Marital Status: Widowed    Spouse Name: N/A  . Number of Children: 2  . Years of Education: N/A   Occupational History  . OFFICE SPECIALIST    Social History Main Topics  . Smoking status: Former Smoker    Types: Cigarettes    Quit date: 09/06/1976  . Smokeless tobacco: Never Used  . Alcohol Use: No     Comment: rarely  . Drug Use: No  . Sexual Activity: Not on file   Other Topics Concern  . 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     Outpatient Encounter Prescriptions as of 11/06/2014  Medication Sig  . atenolol (TENORMIN) 25 MG tablet TAKE 1 TABLET EVERY DAY AS NEEDED FOR BLOOD PRESSURE/MIGRAINE  . atorvastatin (LIPITOR) 20 MG tablet Take 1 tablet (20 mg total) by mouth daily.  Marland Kitchen NEXIUM 40 MG capsule TAKE 1 CAPSULE (40 MG TOTAL) BY MOUTH DAILY.  Marland Kitchen NEXIUM 40 MG capsule TAKE 1 CAPSULE (40 MG TOTAL) BY MOUTH DAILY.  . rizatriptan (MAXALT) 10 MG tablet TAKE 1 TABLET (10 MG TOTAL) BY MOUTH AS NEEDED FOR MIGRAINE. MAY REPEAT IN 2 HOURS IF NEEDED  . traMADol (ULTRAM) 50 MG tablet Take 1 tablet (50 mg total) by mouth every 6 (six) hours as needed. for pain  . valACYclovir (VALTREX) 1000 MG tablet Take 1 tablet (1,000 mg total) by mouth 2 (two) times daily.  . [DISCONTINUED]  traMADol (ULTRAM) 50 MG tablet Take 1 tablet (50 mg total) by mouth every 6 (six) hours as needed. for pain  . amoxicillin-clavulanate (AUGMENTIN) 875-125 MG per tablet Take 1 tablet by mouth every 12 (twelve) hours.    EXAM:  BP 116/70 mmHg  Temp(Src) 98.3 F (36.8 C) (Oral)  Ht 5\' 5"  (1.651 m)  Wt 177 lb 4.8 oz (80.423 kg)  BMI 29.50 kg/m2  Body mass index is 29.5 kg/(m^2).  GENERAL: vitals reviewed and listed above, alert, oriented, appears well hydrated and in no acute distress  Congested  HEENT: atraumatic, conjunctiva  clear, no obvious abnormalities on inspection of external nose and ears righ tm nl left tm wax in eac  OP : no lesion edema or exudate  Drainage tracts  Face tender maxillary and forntal  NECK: no obvious masses on inspection palpation  LUNGS: clear to auscultation bilaterally, no wheezes, rales or rhonchi, good air movement CV: HRRR, no clubbing cyanosis or  peripheral edema nl cap refill  MS: moves all extremities without noticeable focal  abnormality PSYCH: pleasant and  cooperative, no obvious depression or anxiety Lab Results  Component Value Date   WBC 9.7 09/04/2013   HGB 13.1 09/04/2013   HCT 38.7 09/04/2013   PLT 338.0 09/04/2013   GLUCOSE 88 09/04/2013   CHOL 142 01/21/2014   TRIG 117.0 01/21/2014   HDL 55.60 01/21/2014   LDLCALC 63 01/21/2014   ALT 24 09/04/2013   AST 29 09/04/2013   NA 140 09/04/2013   K 3.9 09/04/2013   CL 104 09/04/2013   CREATININE 0.9 09/04/2013   BUN 15 09/04/2013   CO2 26 09/04/2013   TSH 1.16 09/04/2013    ASSESSMENT AND PLAN:  Discussed the following assessment and plan:  Acute recurrent sinusitis, unspecified location  Medication management - pain med   for joints etc  use aceom first then add on tramadol  Bloating - other gi sx  no response with probitic  Hyperlipidemia - ok ot refill med Pain med    As needed not every day .  -Patient advised to return or notify health care team  if symptoms worsen ,persist or new concerns arise.  Patient Instructions  Sinus infection  Lasting 2 weeks anda over may be helped  By  Antibiotic  .  Take tylenol  Twice a day a s needed for the joint pains first    And use tramadol  Only as needed.  As an add on .  This is safer.        [name]. Panosh M.D.

## 2014-11-06 NOTE — Patient Instructions (Signed)
Sinus infection  Lasting 2 weeks anda over may be helped  By  Antibiotic  .  Take tylenol  Twice a day a s needed for the joint pains first    And use tramadol  Only as needed.  As an add on .  This is safer.

## 2014-11-12 ENCOUNTER — Ambulatory Visit: Payer: Self-pay | Admitting: Internal Medicine

## 2014-12-05 ENCOUNTER — Ambulatory Visit: Payer: Self-pay | Admitting: Internal Medicine

## 2014-12-30 ENCOUNTER — Other Ambulatory Visit: Payer: Self-pay | Admitting: Internal Medicine

## 2015-01-01 NOTE — Telephone Encounter (Signed)
Ok to refill x 2  

## 2015-01-01 NOTE — Telephone Encounter (Signed)
Called to the pharmacy and left on machine. 

## 2015-01-31 ENCOUNTER — Telehealth: Payer: Self-pay | Admitting: Family Medicine

## 2015-01-31 ENCOUNTER — Other Ambulatory Visit: Payer: Self-pay | Admitting: Internal Medicine

## 2015-01-31 NOTE — Telephone Encounter (Signed)
lmom for pt to call back

## 2015-01-31 NOTE — Telephone Encounter (Signed)
Sent to the pharmacy by e-scribe for 90 days. Pt is now due for wellness.  Will send a message to scheduling.

## 2015-01-31 NOTE — Telephone Encounter (Signed)
Pt is now due for her medicare wellness exam.  Please help the pt to make an appt.  Pt should come fasting for lab work.  Thanks!

## 2015-02-04 NOTE — Telephone Encounter (Signed)
Patient called back and is scheduled

## 2015-02-05 ENCOUNTER — Other Ambulatory Visit: Payer: Self-pay | Admitting: Internal Medicine

## 2015-02-06 NOTE — Telephone Encounter (Signed)
Sent to the pharmacy by e-scribe.  Pt has upcoming appt on 03/06/15

## 2015-02-11 ENCOUNTER — Other Ambulatory Visit: Payer: Self-pay | Admitting: Internal Medicine

## 2015-02-14 ENCOUNTER — Other Ambulatory Visit: Payer: Self-pay | Admitting: Internal Medicine

## 2015-02-21 ENCOUNTER — Ambulatory Visit: Payer: Medicare Other | Admitting: Internal Medicine

## 2015-03-06 ENCOUNTER — Encounter: Payer: Self-pay | Admitting: Internal Medicine

## 2015-03-06 ENCOUNTER — Ambulatory Visit (INDEPENDENT_AMBULATORY_CARE_PROVIDER_SITE_OTHER): Payer: Medicare Other | Admitting: Internal Medicine

## 2015-03-06 VITALS — BP 110/80 | Temp 97.6°F | Ht 65.0 in | Wt 176.2 lb

## 2015-03-06 DIAGNOSIS — I10 Essential (primary) hypertension: Secondary | ICD-10-CM

## 2015-03-06 DIAGNOSIS — Z0000 Encounter for general adult medical examination without abnormal findings: Secondary | ICD-10-CM

## 2015-03-06 DIAGNOSIS — I83813 Varicose veins of bilateral lower extremities with pain: Secondary | ICD-10-CM

## 2015-03-06 DIAGNOSIS — R519 Headache, unspecified: Secondary | ICD-10-CM

## 2015-03-06 DIAGNOSIS — E785 Hyperlipidemia, unspecified: Secondary | ICD-10-CM | POA: Diagnosis not present

## 2015-03-06 DIAGNOSIS — I83819 Varicose veins of unspecified lower extremities with pain: Secondary | ICD-10-CM | POA: Insufficient documentation

## 2015-03-06 DIAGNOSIS — R51 Headache: Secondary | ICD-10-CM

## 2015-03-06 LAB — LIPID PANEL
Cholesterol: 170 mg/dL (ref 0–200)
HDL: 49.1 mg/dL (ref >39.00)
LDL Cholesterol: 84 mg/dL (ref 0–99)
NONHDL: 120.9
Total CHOL/HDL Ratio: 3
Triglycerides: 183 mg/dL — ABNORMAL HIGH (ref 0.0–149.0)
VLDL: 36.6 mg/dL (ref 0.0–40.0)

## 2015-03-06 LAB — CBC WITH DIFFERENTIAL/PLATELET
BASOS PCT: 0.5 % (ref 0.0–3.0)
Basophils Absolute: 0 10*3/uL (ref 0.0–0.1)
EOS PCT: 4.2 % (ref 0.0–5.0)
Eosinophils Absolute: 0.3 10*3/uL (ref 0.0–0.7)
HCT: 40.4 % (ref 36.0–46.0)
Hemoglobin: 13.5 g/dL (ref 12.0–15.0)
LYMPHS PCT: 32.8 % (ref 12.0–46.0)
Lymphs Abs: 2.7 10*3/uL (ref 0.7–4.0)
MCHC: 33.5 g/dL (ref 30.0–36.0)
MCV: 83.8 fl (ref 78.0–100.0)
MONO ABS: 0.6 10*3/uL (ref 0.1–1.0)
MONOS PCT: 7.4 % (ref 3.0–12.0)
Neutro Abs: 4.5 10*3/uL (ref 1.4–7.7)
Neutrophils Relative %: 55.1 % (ref 43.0–77.0)
Platelets: 236 10*3/uL (ref 150.0–400.0)
RBC: 4.82 Mil/uL (ref 3.87–5.11)
RDW: 12.8 % (ref 11.5–15.5)
WBC: 8.2 10*3/uL (ref 4.0–10.5)

## 2015-03-06 LAB — BASIC METABOLIC PANEL
BUN: 15 mg/dL (ref 6–23)
CO2: 31 meq/L (ref 19–32)
Calcium: 10.1 mg/dL (ref 8.4–10.5)
Chloride: 103 mEq/L (ref 96–112)
Creatinine, Ser: 0.84 mg/dL (ref 0.40–1.20)
GFR: 71.72 mL/min (ref >60.00)
GLUCOSE: 96 mg/dL (ref 70–99)
Potassium: 4.4 mEq/L (ref 3.5–5.1)
Sodium: 140 mEq/L (ref 135–145)

## 2015-03-06 LAB — HEPATIC FUNCTION PANEL
ALT: 21 U/L (ref 0–35)
AST: 23 U/L (ref 0–37)
Albumin: 4.2 g/dL (ref 3.5–5.2)
Alkaline Phosphatase: 70 U/L (ref 39–117)
Bilirubin, Direct: 0.1 mg/dL (ref 0.0–0.3)
Total Bilirubin: 0.5 mg/dL (ref 0.2–1.2)
Total Protein: 7.7 g/dL (ref 6.0–8.3)

## 2015-03-06 LAB — TSH: TSH: 1.86 u[IU]/mL (ref 0.35–4.50)

## 2015-03-06 NOTE — Patient Instructions (Addendum)
Continue lifestyle intervention healthy eating and exercise . Swimming is good for joints  And careful resistence weight may help also  Postures and thius headaches . Ask the vein specialist if covered to recheck cause you hare having pain and swelling from the veins.  Try the FODMAPS diet    For IBS. Look it up on line. Can take  Asa 8 1 mg pre day  If not hx of GI bleeding or stomach ulcers .   No compelling health benefit from VIt E and may have a negative side effect .  Will notify you  of labs when available. Yearly flu vaccine .  considier  Bone density scan next year  Preventive visit and lab in a year unless needed in interim . BP Readings from Last 3 Encounters:  03/06/15 110/80  11/06/14 116/70  08/09/14 116/62   Wt Readings from Last 3 Encounters:  03/06/15 176 lb 3.2 oz (79.924 kg)  11/06/14 177 lb 4.8 oz (80.423 kg)  08/09/14 177 lb 1.6 oz (80.332 kg)

## 2015-03-06 NOTE — Progress Notes (Signed)
Pre visit review using our clinic review tool, if applicable. No additional management support is needed unless otherwise documented below in the visit note.  Chief Complaint  Patient presents with  . Medicare Wellness    ha back ibs    HPI: Michele Monroe 68 y.o. comes in today for Preventive Medicare wellness visit .and Chronic disease management  Back rare [place] of tramadol  Lifting grandchilds some . Going to go to water aerobic signed up . Has problematic recently  Neck  Neck pain . Has ibs taking probiotics  Lipid taking meds  bp has been good   Began vit d and vit e for skin ? Asks about supplements Veins bother her some ache at ankles   ? Not ocvered by insurance has seen vein people    Health Maintenance  Topic Date Due  . INFLUENZA VACCINE  04/07/2015  . MAMMOGRAM  08/22/2016  . TETANUS/TDAP  09/06/2017  . COLONOSCOPY  10/08/2019  . DEXA SCAN  Completed  . ZOSTAVAX  Completed  . PNA vac Low Risk Adult  Completed   Health Maintenance Review LIFESTYLE:  Exercise:  walking dog other only 10 minutes  Tobacco/ETS:no ex tobacck  Alcohol: per day no Sugar beverages: no ocass coke  Sleep over 8 hours  Drug use: no  MEDICARE DOCUMENT QUESTIONS  TO SCAN     Hearing: ok  Vision:  No limitations at present . Last eye check UTD  Safety:  Has smoke detector and wears seat belts.  No firearms. No excess sun exposure. Sees dentist regularly.  Falls: no  Advance directive :  Reviewed  Has old one  Needs updated?  Memory: Felt to be good  ,   Depression: No anhedonia unusual crying sometimes down   Social but plans on getting out more .  Nutrition: Eats well balanced diet; adequate calcium and vitamin D. No swallowing chewing problems.  Injury: no major injuries in the last six months.  Other healthcare providers:  Reviewed today .  Social:  Lives widowed helps with gchildren and now one on the way . No pets.   Preventive parameters: up-to-date  Reviewed    ADLS:   There are no problems or need for assistance  driving, feeding, obtaining food, dressing, toileting and bathing, managing money using phone. She is independent.  ROS:  GEN/ HEENT: No fever, significant weight changes sweats  vision problems hearing changes, CV/ PULM; No chest pain shortness of breath cough, syncope,edema  change in exercise tolerance. GI /GU: No adominal pain, vomiting, change in bowel habits. No blood in the stool. No significant GU symptoms. SKIN/HEME: ,no acute skin rashes suspicious lesions or bleeding. No lymphadenopathy, nodules, masses.  NEURO/ PSYCH:  No neurologic signs such as weakness numbness. No depression anxiety. IMM/ Allergy: No unusual infections.  Allergy .   REST of 12 system review negative except as per HPI   Past Medical History  Diagnosis Date  . VITAMIN D DEFICIENCY 06/30/2010  . HYPERLIPIDEMIA 06/30/2010  . HYPERTENSION 06/30/2010  . ALLERGIC RHINITIS 06/30/2010    perennial  . GERD 06/30/2010  . IBS 06/30/2010  . Headache(784.0) 06/30/2010    migraines  . Internal hemorrhoids   . Coughing up blood 08/13/2012    piece of tissue? will send to path poss from upper airway sinusitis  Final path was "abscess"   . Varicose veins   . Hepatic steatosis   . Renal lesion     Family History  Problem Relation Age of  Onset  . Colon cancer Mother     age 57  . Arthritis Brother   . Other Brother     History   Social History  . Marital Status: Widowed    Spouse Name: N/A  . Number of Children: 2  . Years of Education: N/A   Occupational History  . OFFICE SPECIALIST    Social History Main Topics  . Smoking status: Former Smoker    Types: Cigarettes    Quit date: 09/06/1976  . Smokeless tobacco: Never Used  . Alcohol Use: No     Comment: rarely  . Drug Use: No  . Sexual Activity: Not on file   Other Topics Concern  . 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     Outpatient Encounter Prescriptions as of 03/06/2015  Medication Sig  . atenolol (TENORMIN) 25 MG tablet TAKE 1 TABLET EVERY DAY AS NEEDED FOR BLOOD PRESSURE/MIGRAINE  . atorvastatin (LIPITOR) 20 MG tablet TAKE 1 TABLET (20 MG TOTAL) BY MOUTH DAILY.  Marland Kitchen NEXIUM 40 MG capsule TAKE 1 CAPSULE (40 MG TOTAL) BY MOUTH DAILY.  Marland Kitchen NEXIUM 40 MG capsule TAKE 1 CAPSULE (40 MG TOTAL) BY MOUTH DAILY.  Marland Kitchen NEXIUM 40 MG capsule TAKE 1 CAPSULE (40 MG TOTAL) BY MOUTH DAILY.  . rizatriptan (MAXALT) 10 MG tablet TAKE 1 TABLET (10 MG TOTAL) BY MOUTH AS NEEDED FOR MIGRAINE. MAY REPEAT IN 2 HOURS IF NEEDED  . rizatriptan (MAXALT) 10 MG tablet TAKE 1 TABLET (10 MG TOTAL) BY MOUTH AS NEEDED FOR MIGRAINE. MAY REPEAT IN 2 HOURS IF NEEDED  . traMADol (ULTRAM) 50 MG tablet TAKE 1 TABLET BY MOUTH EVERY 6 HOURS AS NEEDED FOR PAIN  . valACYclovir (VALTREX) 1000 MG tablet Take 1 tablet (1,000 mg total) by mouth 2 (two) times daily. (Patient not taking: Reported on 03/06/2015)  . [DISCONTINUED] amoxicillin-clavulanate (AUGMENTIN) 875-125 MG per tablet Take 1 tablet by mouth every 12 (twelve) hours.   No facility-administered encounter medications on file as of 03/06/2015.    EXAM:  BP 110/80 mmHg  Temp(Src) 97.6 F (36.4 C) (Oral)  Ht 5\' 5"  (1.651 m)  Wt 176 lb 3.2 oz (79.924 kg)  BMI 29.32 kg/m2  Body mass index is 29.32 kg/(m^2).  Physical Exam: Vital signs reviewed IDP:OEUM is a well-developed well-nourished alert cooperative   who appears stated age in no acute distress.  HEENT: normocephalic atraumatic , Eyes: PERRL EOM's full, conjunctiva clear, Nares: paten,t no deformity discharge or tenderness., Ears: no deformity EAC's clear TMs with normal landmarks. Mouth: clear OP, no lesions, edema.  Moist mucous membranes. Dentition in adequate repair. NECK: supple without masses, thyromegaly or  bruits. CHEST/PULM:  Clear to auscultation and percussion breath sounds equal no wheeze , rales or rhonchi. No chest wall deformities or tenderness.Breast: normal by inspection . No dimpling, discharge, masses, tenderness or discharge .CV: PMI is nondisplaced, S1 S2 no gallops, murmurs, rubs. Peripheral pulses are full without delay.No JVD .  ABDOMEN: Bowel sounds normal nontender  No guard or rebound, no hepato splenomegal no CVA tenderness.  No hernia. Extremtities:  No clubbing cyanosis or edema, no acute joint swelling or redness no focal atrophy NEURO:  Oriented x3, cranial  nerves 3-12 appear to be intact, no obvious focal weakness,gait within normal limits no abnormal reflexes or asymmetrical SKIN: No acute rashes normal turgor, color, no bruising or petechiae. PSYCH: Oriented, good eye contact, no obvious depression anxiety, cognition and judgment appear normal. LN: no cervical axillary inguinal adenopathy No noted deficits in memory, attention, and speech.   ASSESSMENT AND PLAN:  Discussed the following assessment and plan:  Visit for preventive health examination - Plan: Basic metabolic panel, CBC with Differential/Platelet, Hepatic function panel, Lipid panel, TSH  Medicare annual wellness visit, subsequent  Essential hypertension - Plan: Basic metabolic panel, CBC with Differential/Platelet, Hepatic function panel, Lipid panel, TSH  Hyperlipidemia - med monitoring - Plan: Basic metabolic panel, CBC with Differential/Platelet, Hepatic function panel, Lipid panel, TSH  Recurrent headache  Varicose veins with pain, bilateral Gi precautions with asa risk benefit would   Patient Care Team: Burnis Medin, MD as PCP - General Lafayette Dragon, MD as Attending Physician (Gastroenterology)  Patient Instructions   Continue lifestyle intervention healthy eating and exercise . Swimming is good for joints  And careful resistence weight may help also  Postures and thius headaches . Ask  the vein specialist if covered to recheck cause you hare having pain and swelling from the veins.  Try the FODMAPS diet    For IBS. Look it up on line. Can take  Asa 8 1 mg pre day  If not hx of GI bleeding or stomach ulcers .   No compelling health benefit from VIt E and may have a negative side effect .  Will notify you  of labs when available. Yearly flu vaccine .  considier  Bone density scan next year  Preventive visit and lab in a year unless needed in interim . BP Readings from Last 3 Encounters:  03/06/15 110/80  11/06/14 116/70  08/09/14 116/62   Wt Readings from Last 3 Encounters:  03/06/15 176 lb 3.2 oz (79.924 kg)  11/06/14 177 lb 4.8 oz (80.423 kg)  08/09/14 177 lb 1.6 oz (80.332 kg)         Mariann Laster K. Panosh M.D.

## 2015-03-12 ENCOUNTER — Telehealth: Payer: Self-pay | Admitting: Family Medicine

## 2015-03-12 NOTE — Telephone Encounter (Signed)
Spoke to the pt.  She needs a note to [employer].  Needs to state that she should not continue with their services due to her health..  Their fax number is [phone].  Will send to [facility] for authorization.

## 2015-03-25 ENCOUNTER — Telehealth: Payer: Self-pay | Admitting: Internal Medicine

## 2015-03-25 NOTE — Telephone Encounter (Signed)
Left a message for the pt to return my call.  

## 2015-03-25 NOTE — Telephone Encounter (Signed)
Spoke to the pt.  Advised that [facility] is still out of the office but will follow up when she returns.

## 2015-03-25 NOTE — Telephone Encounter (Signed)
Pt call to ask if the paperwork that you and her discuss has been faxed over to [facility]    Fax no. [phone]

## 2015-03-27 NOTE — Telephone Encounter (Signed)
Ok to write correspondence to discontinue program for medical reasons

## 2015-03-28 ENCOUNTER — Encounter: Payer: Self-pay | Admitting: Family Medicine

## 2015-03-28 NOTE — Telephone Encounter (Signed)
Letter sent by fax.  Pt notified.

## 2015-04-08 ENCOUNTER — Encounter: Payer: Self-pay | Admitting: Internal Medicine

## 2015-04-08 ENCOUNTER — Ambulatory Visit (INDEPENDENT_AMBULATORY_CARE_PROVIDER_SITE_OTHER): Payer: Medicare Other | Admitting: Internal Medicine

## 2015-04-08 VITALS — BP 110/70 | HR 76 | Ht 65.0 in | Wt 177.8 lb

## 2015-04-08 DIAGNOSIS — K589 Irritable bowel syndrome without diarrhea: Secondary | ICD-10-CM | POA: Diagnosis not present

## 2015-04-08 MED ORDER — DICYCLOMINE HCL 20 MG PO TABS: 20.0000 mg | ORAL_TABLET | Freq: Two times a day (BID) | ORAL | Status: DC

## 2015-04-08 MED ORDER — RANITIDINE HCL 300 MG PO TABS: 300.0000 mg | ORAL_TABLET | Freq: Two times a day (BID) | ORAL | Status: DC

## 2015-04-08 NOTE — Progress Notes (Signed)
[name] [birth year] [id number]  Note: This dictation was prepared with Dragon digital system. Any transcriptional errors that result from this procedure are unintentional.   History of Present Illness: This is a 69 year old white female with irritable bowel syndrome and family history of colon cancer in her mother at age 67. She missed her appointment to in May 2015. Last appointment December 2014. Last colonoscopy February 2011. She was due February 2016.Marland Kitchen She is complaining of crampy abdominal pain and urgent diarrhea especially in the mornings. Upper abdominal ultrasound in December 2015 showed left renal angiolipoma    Past Medical History  Diagnosis Date  . VITAMIN D DEFICIENCY 06/30/2010  . HYPERLIPIDEMIA 06/30/2010  . HYPERTENSION 06/30/2010  . ALLERGIC RHINITIS 06/30/2010    perennial  . GERD 06/30/2010  . IBS 06/30/2010  . Headache(784.0) 06/30/2010    migraines  . Internal hemorrhoids   . Coughing up blood 08/13/2012    piece of tissue? will send to path poss from upper airway sinusitis  Final path was "abscess"   . Varicose veins   . Hepatic steatosis   . Renal lesion     Past Surgical History  Procedure Laterality Date  . Rotator cuff repair Right   . Breast surgery      needle bx left breast  . Feet      surgery  . Varicose veins      No Known Allergies  Family history and social history have been reviewed.  Review of Systems: Crampy abdominal pain. Occasional diarrhea. Denies rectal bleeding  The remainder of the 10 point ROS is negative except as outlined in the H&P  Physical Exam: General Appearance Well developed, in no distress Eyes  Non icteric  HEENT  Non traumatic, normocephalic  Mouth No lesion, tongue papillated, no cheilosis Neck Supple without adenopathy, thyroid not enlarged, no carotid bruits, no JVD Lungs Clear to auscultation bilaterally COR Normal S1, normal S2, regular rhythm, no murmur, quiet precordium Abdomen soft nontender  with normoactive bowel sounds. No distention. Liver edge at costal margin Rectal not done Extremities  No pedal edema Skin No lesions Neurological Alert and oriented x 3 Psychological Normal mood and affect  Assessment and Plan:   69 year old white female with irritable bowel syndrome and crampy abdominal pain with the urgency especially in the mornings  Consistent with irritable bowel syndrome.  Patient instructed to follow High-fiber diet and will begin Bentyl 20 mg twice a day for anti-spasmodic affect  Gastroesophageal reflux. Currently on Nexium 40 mg daily but is concerned about  possible of renal as well as lother side effects based on recent reports We will switch her to ranitidine 300 mg twice a day.  Family history of colon cancer in her mother. Due for colonoscopy this year.Marland Kitchen She will follow-up with Dr. Silverio Decamp . We will send  recall letter for October 2016    Michele Monroe 04/08/2015

## 2015-04-08 NOTE — Patient Instructions (Addendum)
We have sent the following medications to your pharmacy for you to pick up at your convenience: Generic zantac and generic bentyl  Stop your nexium.   We have put you in for a colon recall for October 2016, we will notify you.   I appreciate the opportunity to care for you.    CC- Dr. Regis Bill

## 2015-04-16 NOTE — Telephone Encounter (Signed)
Note completed and faxed.  Pt aware.

## 2015-05-11 ENCOUNTER — Other Ambulatory Visit: Payer: Self-pay | Admitting: Internal Medicine

## 2015-05-14 NOTE — Telephone Encounter (Signed)
Sent to the pharmacy by e-scribe. 

## 2015-05-15 ENCOUNTER — Encounter: Payer: Self-pay | Admitting: Gastroenterology

## 2015-05-19 ENCOUNTER — Other Ambulatory Visit: Payer: Self-pay | Admitting: Internal Medicine

## 2015-05-19 NOTE — Telephone Encounter (Signed)
Sent to the pharmacy by e-scribe. 

## 2015-05-20 ENCOUNTER — Other Ambulatory Visit: Payer: Self-pay | Admitting: Internal Medicine

## 2015-05-21 NOTE — Telephone Encounter (Signed)
Denied.  Filled on 05/14/15

## 2015-06-03 ENCOUNTER — Ambulatory Visit (INDEPENDENT_AMBULATORY_CARE_PROVIDER_SITE_OTHER): Payer: Medicare Other | Admitting: Family Medicine

## 2015-06-03 DIAGNOSIS — Z23 Encounter for immunization: Secondary | ICD-10-CM | POA: Diagnosis not present

## 2015-06-18 ENCOUNTER — Telehealth: Payer: Self-pay | Admitting: Internal Medicine

## 2015-06-18 NOTE — Telephone Encounter (Signed)
Pt is calling to report the ranitidine 300 mg is not working for her indigestion. [facility] [address]. Please advise

## 2015-06-19 NOTE — Telephone Encounter (Signed)
Spoke to the pt. She has a friend who is going to give her some OTC medication from [facility].  She does not know the name of the medication.  I will call her back tomorrow for confirmation.

## 2015-06-19 NOTE — Telephone Encounter (Signed)
She can change over to  otc  Or rx  Omeprazole  20 Once a day 15-20 minutes before  a meal  And then rov in 1 months to assess

## 2015-06-20 ENCOUNTER — Telehealth: Payer: Self-pay | Admitting: Family Medicine

## 2015-06-20 DIAGNOSIS — K589 Irritable bowel syndrome without diarrhea: Secondary | ICD-10-CM

## 2015-06-20 NOTE — Telephone Encounter (Signed)
Pt would like referral to nutritionist for IBS.  Please advise. Thanks!

## 2015-06-20 NOTE — Telephone Encounter (Signed)
Spoke to the pt. She forget to get the name of the medication. However, going to go to [facility] this weekend and get medication.  Will call back on Monday.

## 2015-06-20 NOTE — Telephone Encounter (Signed)
Ok to do this   Make sure has tried FODMAP diet

## 2015-06-23 NOTE — Telephone Encounter (Signed)
Left a message for a return call.

## 2015-06-26 ENCOUNTER — Ambulatory Visit (AMBULATORY_SURGERY_CENTER): Payer: Self-pay | Admitting: *Deleted

## 2015-06-26 VITALS — Ht 65.0 in | Wt 177.0 lb

## 2015-06-26 DIAGNOSIS — Z800 Family history of malignant neoplasm of digestive organs: Secondary | ICD-10-CM

## 2015-06-26 MED ORDER — NA SULFATE-K SULFATE-MG SULF 17.5-3.13-1.6 GM/177ML PO SOLN: 1.0000 | Freq: Once | ORAL | Status: DC

## 2015-06-26 NOTE — Progress Notes (Signed)
No egg or soy allergy. No anesthesia problems.  No home O2.  No diet meds.  

## 2015-06-26 NOTE — Addendum Note (Signed)
Addended by: [place] Bailiff D on: 06/26/2015 01:29 PM   Modules accepted: Orders

## 2015-07-01 NOTE — Telephone Encounter (Signed)
Spoke to the pt.  She is taking ranitidine.  She stated it just looks different and is thinking about trying Tums.  Has an upcoming colonoscopy.  Would like to wait to be seen by [facility] for acid reflux until after the colonoscopy.  She will call back if needed.

## 2015-07-01 NOTE — Telephone Encounter (Signed)
Spoke to the pt.  Has not tried FODMAP diet.  Only wants referral.  Order placed in the system.

## 2015-07-10 ENCOUNTER — Encounter: Payer: Self-pay | Admitting: Gastroenterology

## 2015-07-10 ENCOUNTER — Ambulatory Visit (AMBULATORY_SURGERY_CENTER): Payer: Medicare Other | Admitting: Gastroenterology

## 2015-07-10 VITALS — BP 113/78 | HR 63 | Temp 96.8°F | Resp 11 | Ht 65.0 in | Wt 177.0 lb

## 2015-07-10 DIAGNOSIS — Z800 Family history of malignant neoplasm of digestive organs: Secondary | ICD-10-CM | POA: Diagnosis not present

## 2015-07-10 DIAGNOSIS — Z1211 Encounter for screening for malignant neoplasm of colon: Secondary | ICD-10-CM

## 2015-07-10 DIAGNOSIS — D120 Benign neoplasm of cecum: Secondary | ICD-10-CM | POA: Diagnosis not present

## 2015-07-10 MED ORDER — SODIUM CHLORIDE 0.9 % IV SOLN: 500.0000 mL | INTRAVENOUS | Status: DC

## 2015-07-10 MED ORDER — HYDROCORTISONE 2.5 % RE CREA: 1.0000 "application " | TOPICAL_CREAM | Freq: Two times a day (BID) | RECTAL | Status: DC | PRN

## 2015-07-10 NOTE — Op Note (Signed)
[facility]  [address] [place], [address]   COLONOSCOPY PROCEDURE REPORT  PATIENT: Michele Monroe, Michele Monroe  MR#: [id number] BIRTHDATE: [birth year] , 20  yrs. old GENDER: female ENDOSCOPIST: Harl Bowie, MD REFERRED OZ:HYQMV Darnelle Going, M.D. PROCEDURE DATE:  07/10/2015 PROCEDURE:   Colonoscopy, screening and Colonoscopy with cold biopsy polypectomy First Screening Colonoscopy - Avg.  risk and is 50 yrs.  old or older - No.  Prior Negative Screening - Now for repeat screening. Less than 10 yrs Prior Negative Screening - Now for repeat screening.  Above average risk  History of Adenoma - Now for follow-up colonoscopy & has been > or = to 3 yrs.  N/A  Polyps removed today? Yes ASA CLASS:   Class II INDICATIONS:Screening for colonic neoplasia and FH Colon or Rectal Adenocarcinoma. MEDICATIONS: Propofol 200 mg IV  DESCRIPTION OF PROCEDURE:   After the risks benefits and alternatives of the procedure were thoroughly explained, informed consent was obtained.  The digital rectal exam revealed no abnormalities of the rectum.   The LB [id number]  endoscope was introduced through the anus and advanced to the terminal ileum which was intubated for a short distance. No adverse events experienced.   The quality of the prep was good.  The instrument was then slowly withdrawn as the colon was fully examined. Estimated blood loss is zero unless otherwise noted in this procedure report.   COLON FINDINGS: The examined terminal ileum appeared to be normal. A sessile polyp ranging between 3-85mm in size was found at the cecum.  A biopsy was performed using cold forceps.   Moderate sized internal Grade II hemorrhoids were found.   The examination was otherwise normal.  Retroflexed views revealed internal Grade II hemorrhoids. The time to cecum = 5.7 Withdrawal time = 7.5   The scope was withdrawn and the procedure completed. COMPLICATIONS: There were no immediate  complications.  ENDOSCOPIC IMPRESSION: 1.   The examined terminal ileum appeared to be normal 2.   Sessile polyp ranging between 3-84mm in size was found at the cecum; biopsy was performed using cold forceps 3.   Moderate sized internal Grade II hemorrhoids 4.   The examination was otherwise normal  RECOMMENDATIONS: Given your significant family history of colon cancer, you should have a repeat colonoscopy in 5 years  eSigned:  Harl Bowie, MD 07/10/2015 10:22 AM

## 2015-07-10 NOTE — Progress Notes (Signed)
Called to room to assist during endoscopic procedure.  Patient ID and intended procedure confirmed with present staff. Received instructions for my participation in the procedure from the performing physician.  

## 2015-07-10 NOTE — Patient Instructions (Signed)
YOU HAD AN ENDOSCOPIC PROCEDURE TODAY AT THE [facility]:   Refer to the procedure report that was given to you for any specific questions about what was found during the examination.  If the procedure report does not answer your questions, please call your gastroenterologist to clarify.  If you requested that your care partner not be given the details of your procedure findings, then the procedure report has been included in a sealed envelope for you to review at your convenience later.  YOU SHOULD EXPECT: Some feelings of bloating in the abdomen. Passage of more gas than usual.  Walking can help get rid of the air that was put into your GI tract during the procedure and reduce the bloating. If you had a lower endoscopy (such as a colonoscopy or flexible sigmoidoscopy) you may notice spotting of blood in your stool or on the toilet paper. If you underwent a bowel prep for your procedure, you may not have a normal bowel movement for a few days.  Please Note:  You might notice some irritation and congestion in your nose or some drainage.  This is from the oxygen used during your procedure.  There is no need for concern and it should clear up in a day or so.  SYMPTOMS TO REPORT IMMEDIATELY:   Following lower endoscopy (colonoscopy or flexible sigmoidoscopy):  Excessive amounts of blood in the stool  Significant tenderness or worsening of abdominal pains  Swelling of the abdomen that is new, acute  Fever of 100F or higher   For urgent or emergent issues, a gastroenterologist can be reached at any hour by calling [phone].   DIET: Your first meal following the procedure should be a small meal and then it is ok to progress to your normal diet. Heavy or fried foods are harder to digest and may make you feel nauseous or bloated.  Likewise, meals heavy in dairy and vegetables can increase bloating.  Drink plenty of fluids but you should avoid alcoholic beverages for 24  hours.  ACTIVITY:  You should plan to take it easy for the rest of today and you should NOT DRIVE or use heavy machinery until tomorrow (because of the sedation medicines used during the test).    FOLLOW UP: Our staff will call the number listed on your records the next business day following your procedure to check on you and address any questions or concerns that you may have regarding the information given to you following your procedure. If we do not reach you, we will leave a message.  However, if you are feeling well and you are not experiencing any problems, there is no need to return our call.  We will assume that you have returned to your regular daily activities without incident.  If any biopsies were taken you will be contacted by phone or by letter within the next 1-3 weeks.  Please call us at [phone] if you have not heard about the biopsies in 3 weeks.    SIGNATURES/CONFIDENTIALITY: You and/or your care partner have signed paperwork which will be entered into your electronic medical record.  These signatures attest to the fact that that the information above on your After Visit Summary has been reviewed and is understood.  Full responsibility of the confidentiality of this discharge information lies with you and/or your care-partner. 

## 2015-07-10 NOTE — Progress Notes (Signed)
Report to PACU, RN, vss, BBS= Clear.  

## 2015-07-11 ENCOUNTER — Telehealth: Payer: Self-pay | Admitting: *Deleted

## 2015-07-11 NOTE — Telephone Encounter (Signed)
  Follow up Call-  Call back number 07/10/2015  Post procedure Call Back phone  # [phone]  Permission to leave phone message Yes     Patient questions:  Do you have a fever, pain , or abdominal swelling? No. Pain Score  0 *  Have you tolerated food without any problems? Yes.    Have you been able to return to your normal activities? Yes.    Do you have any questions about your discharge instructions: Diet   No. Medications  No. Follow up visit  No.  Do you have questions or concerns about your Care? No.  Actions: * If pain score is 4 or above: No action needed, pain <4.

## 2015-07-18 ENCOUNTER — Other Ambulatory Visit: Payer: Self-pay

## 2015-07-18 ENCOUNTER — Other Ambulatory Visit: Payer: Self-pay | Admitting: Internal Medicine

## 2015-07-18 DIAGNOSIS — Z1231 Encounter for screening mammogram for malignant neoplasm of breast: Secondary | ICD-10-CM

## 2015-07-18 NOTE — Telephone Encounter (Signed)
Ok to refill x 1  

## 2015-07-21 NOTE — Telephone Encounter (Signed)
Called to the pharmacy and left on machine. 

## 2015-08-07 ENCOUNTER — Encounter: Payer: Self-pay | Admitting: Gastroenterology

## 2015-08-21 ENCOUNTER — Other Ambulatory Visit: Payer: Self-pay | Admitting: Internal Medicine

## 2015-08-22 NOTE — Telephone Encounter (Signed)
Sent to the pharmacy by e-scribe.  Last filled in June 2016.

## 2015-08-29 ENCOUNTER — Ambulatory Visit: Payer: Medicare Other

## 2015-09-23 ENCOUNTER — Encounter: Payer: Self-pay | Admitting: Internal Medicine

## 2015-09-25 ENCOUNTER — Ambulatory Visit: Payer: Medicare Other

## 2015-10-01 ENCOUNTER — Ambulatory Visit
Admission: RE | Admit: 2015-10-01 | Discharge: 2015-10-01 | Disposition: A | Discharge status: Home / Self Care | Payer: Medicare Other | Source: Ambulatory Visit

## 2015-10-01 DIAGNOSIS — Z1231 Encounter for screening mammogram for malignant neoplasm of breast: Secondary | ICD-10-CM

## 2015-10-08 ENCOUNTER — Telehealth: Payer: Self-pay | Admitting: Gastroenterology

## 2015-10-08 NOTE — Telephone Encounter (Signed)
I spoke with the patient. She is not having any bleeding now. She had a bowel movement and when she wiped, there was blood. She denies pain or dizziness. History of internal hemorrhoids as per colonoscopy report 07/10/2015. She will monitor. If it occurs again or she develops new symptoms, she will come for evaluation. Declines to consider banding at this time.

## 2015-10-08 NOTE — Telephone Encounter (Signed)
I have left message for the patient to call back 

## 2015-12-01 ENCOUNTER — Encounter: Payer: Self-pay | Admitting: Internal Medicine

## 2015-12-01 ENCOUNTER — Ambulatory Visit (INDEPENDENT_AMBULATORY_CARE_PROVIDER_SITE_OTHER): Payer: Medicare Other | Admitting: Internal Medicine

## 2015-12-01 VITALS — BP 110/66 | HR 116 | Temp 98.7°F | Wt 176.4 lb

## 2015-12-01 DIAGNOSIS — J069 Acute upper respiratory infection, unspecified: Secondary | ICD-10-CM | POA: Diagnosis not present

## 2015-12-01 DIAGNOSIS — J0191 Acute recurrent sinusitis, unspecified: Secondary | ICD-10-CM | POA: Diagnosis not present

## 2015-12-01 DIAGNOSIS — B9789 Other viral agents as the cause of diseases classified elsewhere: Principal | ICD-10-CM

## 2015-12-01 LAB — POCT INFLUENZA A/B
INFLUENZA A, POC: NEGATIVE
Influenza B, POC: NEGATIVE

## 2015-12-01 MED ORDER — AMOXICILLIN-POT CLAVULANATE 875-125 MG PO TABS: 1.0000 | ORAL_TABLET | Freq: Two times a day (BID) | ORAL | Status: DC

## 2015-12-01 NOTE — Patient Instructions (Addendum)
This acts like a vrial respiratory infection  Which have to run their course and get b etter on their own  However since you have had sinus congestion for the prev fdew weeks and now have sinus pain there is a change that antibiotics may help  You.  Your lung exam is normal today without signs of pneumonia . i fnot improving you can add antibiotic for possible sinus infection

## 2015-12-01 NOTE — Progress Notes (Signed)
Chief Complaint  Patient presents with  . Cough    productive/clear, was green, symptoms stated Saturday am    HPI: Michele Monroe 70 y.o.  Patient Michele Monroe  comes in today for SDA for  new problem evaluation. Onset 2 days. Of body aches chills and inc sx  Onset cough and sore throat.  and chills   And  Dec appetite   No vomiting or diarrhea feels weak all over.  However has had sinus drainage and couging  Green clear in the past 21 weeks off and on  Face tendernss for 3-4 days . Exposed to grand childrem some sick? ROS: See pertinent positives and negatives per HPI. No hemoptysis   Past Medical History  Diagnosis Date  . VITAMIN D DEFICIENCY 06/30/2010  . HYPERLIPIDEMIA 06/30/2010  . HYPERTENSION 06/30/2010  . ALLERGIC RHINITIS 06/30/2010    perennial  . GERD 06/30/2010  . IBS 06/30/2010  . Headache(784.0) 06/30/2010    migraines  . Internal hemorrhoids   . Coughing up blood 08/13/2012    piece of tissue? will send to path poss from upper airway sinusitis  Final path was "abscess"   . Varicose veins   . Hepatic steatosis   . Renal lesion   . Allergy     seasonal    Family History  Problem Relation Age of Onset  . Colon cancer Mother     age 89  . Arthritis Brother   . Other Brother     Social History   Social History  . Marital Status: Widowed    Spouse Name: N/A  . Number of Children: 2  . Years of Education: N/A   Occupational History  . retired    Social History Main Topics  . Smoking status: Former Smoker    Types: Cigarettes    Quit date: 09/06/1976  . Smokeless tobacco: Never Used  . Alcohol Use: No     Comment: rarely  . Drug Use: No  . Sexual Activity: Not Asked   Other Topics Concern  . 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     Outpatient  Prescriptions Prior to Visit  Medication Sig Dispense Refill  . atenolol (TENORMIN) 25 MG tablet Take 12.5 mg by mouth daily.    Marland Kitchen atorvastatin (LIPITOR) 20 MG tablet TAKE 1 TABLET (20 MG TOTAL) BY MOUTH DAILY. 90 tablet 2  . dicyclomine (BENTYL) 20 MG tablet Take 1 tablet (20 mg total) by mouth 2 (two) times daily. 180 tablet 3  . hydrocortisone (ANUSOL-HC) 2.5 % rectal cream Place 1 application rectally 2 (two) times daily as needed for hemorrhoids or itching. 30 g 1  . Multiple Vitamins-Minerals (MULTIVITAMIN ADULT PO) Take by mouth.    . rizatriptan (MAXALT) 10 MG tablet TAKE 1 TABLET (10 MG TOTAL) BY MOUTH AS NEEDED FOR MIGRAINE. MAY REPEAT IN 2 HOURS IF NEEDED 36 tablet 0  . traMADol (ULTRAM) 50 MG tablet TAKE 1 TABLET BY MOUTH EVERY 6 HOURS AS NEEDED FOR PAIN 30 tablet 0  . OVER THE COUNTER MEDICATION Reported on 12/01/2015    . OVER THE COUNTER MEDICATION Reported on 12/01/2015    . ranitidine (ZANTAC) 300 MG tablet Take 1  tablet (300 mg total) by mouth 2 (two) times daily. (Patient not taking: Reported on 07/10/2015) 180 tablet 3  . rizatriptan (MAXALT) 10 MG tablet TAKE 1 TABLET (10 MG TOTAL) BY MOUTH AS NEEDED FOR MIGRAINE. MAY REPEAT IN 2 HOURS IF NEEDED (Patient not taking: Reported on 12/01/2015) 36 tablet 0   No facility-administered medications prior to visit.     EXAM:  BP 110/66 mmHg  Pulse 116  Temp(Src) 98.7 F (37.1 C) (Oral)  Wt 176 lb 6 oz (80.003 kg)  SpO2 96%  Body mass index is 29.35 kg/(m^2). WDWN in NAD  quiet respirations; mildly congested  . Non toxic .  HEENT: Normocephalic ;atraumatic , Eyes;  PERRL, EOMs  Full, lids and conjunctiva clear,,Ears: no deformities, canals nl, TM landmarks normal, Nose: no deformity or discharge but congested;face sinus area  mod  tender Mouth : OP clear without lesion or edema . Very red post tonsillar  Crescents  Op but no drainage seen Neck: Supple without adenopathy or masses or bruitstender ad nodes  Chest:  Clear to A&P  without wheezes rales or rhonchi CV:  S1-S2 no gallops or murmurs peripheral perfusion is normal Skin :nl perfusion and no acute rashes    poct flu negA  ASSESSMENT AND PLAN:  Discussed the following assessment and plan:  Viral upper respiratory tract infection with cough - vs  sinusitis wporesning  - Plan: POCT Influenza A/B  Acute recurrent sinusitis, unspecified location - uncertain how much is causing sx but has had sig sinus disease infection i nthe past If needed add on antibiotic for this   .    Expectant management.  Risk benefit of medication discussed.  -Patient advised to return or notify health care team  if symptoms worsen ,persist or new concerns arise.  Patient Instructions  This acts like a vrial respiratory infection  Which have to run their course and get b etter on their own  However since you have had sinus congestion for the prev fdew weeks and now have sinus pain there is a change that antibiotics may help  You.  Your lung exam is normal today without signs of pneumonia . i fnot improving you can add antibiotic for possible sinus infection     Mariann Laster K. Gautam Langhorst M.D.

## 2015-12-01 NOTE — Progress Notes (Signed)
Pre visit review using our clinic review tool, if applicable. No additional management support is needed unless otherwise documented below in the visit note. 

## 2015-12-15 ENCOUNTER — Other Ambulatory Visit: Payer: Self-pay | Admitting: Internal Medicine

## 2015-12-16 NOTE — Telephone Encounter (Signed)
Sent to the pharmacy by e-scribe. 

## 2016-01-01 ENCOUNTER — Ambulatory Visit (INDEPENDENT_AMBULATORY_CARE_PROVIDER_SITE_OTHER): Payer: Medicare Other | Admitting: Internal Medicine

## 2016-01-01 ENCOUNTER — Encounter: Payer: Self-pay | Admitting: Internal Medicine

## 2016-01-01 VITALS — BP 130/72 | Temp 98.3°F | Ht 65.0 in | Wt 179.8 lb

## 2016-01-01 DIAGNOSIS — M25473 Effusion, unspecified ankle: Secondary | ICD-10-CM | POA: Diagnosis not present

## 2016-01-01 DIAGNOSIS — T148 Other injury of unspecified body region: Secondary | ICD-10-CM

## 2016-01-01 DIAGNOSIS — W57XXXA Bitten or stung by nonvenomous insect and other nonvenomous arthropods, initial encounter: Secondary | ICD-10-CM

## 2016-01-01 MED ORDER — DOXYCYCLINE HYCLATE 100 MG PO TABS: ORAL_TABLET | ORAL | Status: DC

## 2016-01-01 NOTE — Patient Instructions (Signed)
Can take tick bite prophylaxis . Contact us if fever .  Increase  Redness.   Get cpx with labs in July  consider  Compression stocking s in day  .    Tick Bite Information Ticks are insects that attach themselves to the skin and draw blood for food. There are various types of ticks. Common types include wood ticks and deer ticks. Most ticks live in shrubs and grassy areas. Ticks can climb onto your body when you make contact with leaves or grass where the tick is waiting. The most common places on the body for ticks to attach themselves are the scalp, neck, armpits, waist, and groin. Most tick bites are harmless, but sometimes ticks carry germs that cause diseases. These germs can be spread to a person during the tick's feeding process. The chance of a disease spreading through a tick bite depends on:   The type of tick.  Time of year.   How long the tick is attached.   Geographic location.  HOW CAN YOU PREVENT TICK BITES? Take these steps to help prevent tick bites when you are outdoors:  Wear protective clothing. Long sleeves and long pants are best.   Wear white clothes so you can see ticks more easily.  Tuck your pant legs into your socks.   If walking on a trail, stay in the middle of the trail to avoid brushing against bushes.  Avoid walking through areas with long grass.  Put insect repellent on all exposed skin and along boot tops, pant legs, and sleeve cuffs.   Check clothing, hair, and skin repeatedly and before going inside.   Brush off any ticks that are not attached.  Take a shower or bath as soon as possible after being outdoors.  WHAT IS THE PROPER WAY TO REMOVE A TICK? Ticks should be removed as soon as possible to help prevent diseases caused by tick bites. 1. If latex gloves are available, put them on before trying to remove a tick.  2. Using fine-point tweezers, grasp the tick as close to the skin as possible. You may also use curved forceps or a  tick removal tool. Grasp the tick as close to its head as possible. Avoid grasping the tick on its body. 3. Pull gently with steady upward pressure until the tick lets go. Do not twist the tick or jerk it suddenly. This may break off the tick's head or mouth parts. 4. Do not squeeze or crush the tick's body. This could force disease-carrying fluids from the tick into your body.  5. After the tick is removed, wash the bite area and your hands with soap and water or other disinfectant such as alcohol. 6. Apply a small amount of antiseptic cream or ointment to the bite site.  7. Wash and disinfect any instruments that were used.  Do not try to remove a tick by applying a hot match, petroleum jelly, or fingernail polish to the tick. These methods do not work and may increase the chances of disease being spread from the tick bite.  WHEN SHOULD YOU SEEK MEDICAL CARE? Contact your health care provider if you are unable to remove a tick from your skin or if a part of the tick breaks off and is stuck in the skin.  After a tick bite, you need to be aware of signs and symptoms that could be related to diseases spread by ticks. Contact your health care provider if you develop any of the following in  the days or weeks after the tick bite:  Unexplained fever.  Rash. A circular rash that appears days or weeks after the tick bite may indicate the possibility of Lyme disease. The rash may resemble a target with a bull's-eye and may occur at a different part of your body than the tick bite.  Redness and swelling in the area of the tick bite.   Tender, swollen lymph glands.   Diarrhea.   Weight loss.   Cough.   Fatigue.   Muscle, joint, or bone pain.   Abdominal pain.   Headache.   Lethargy or a change in your level of consciousness.  Difficulty walking or moving your legs.   Numbness in the legs.   Paralysis.  Shortness of breath.   Confusion.   Repeated vomiting.      This information is not intended to replace advice given to you by your health care provider. Make sure you discuss any questions you have with your health care provider.   Document Released: 08/20/2000 Document Revised: 09/13/2014 Document Reviewed: 01/31/2013 Elsevier Interactive Patient Education [employer].

## 2016-01-01 NOTE — Progress Notes (Signed)
Pre visit review using our clinic review tool, if applicable. No additional management support is needed unless otherwise documented below in the visit note.   Chief Complaint  Patient presents with  . Insect Bite    Bite behind knee on rt leg.  Would like some medication for her ankle swelling.  Worse at night.  . Ankle Swelling    HPI: Michele Monroe 70 y.o.   Removed tick back of right leg yesterday and didn't know she had it  Itching and now swollen  Cant see has dogs  Worried about infection  Has had ha no fever .  Also gets ankle swelling  For a while without other sx  elevate legs no pain  Has vv  ROS: See pertinent positives and negatives per HPI.  Past Medical History  Diagnosis Date  . VITAMIN D DEFICIENCY 06/30/2010  . HYPERLIPIDEMIA 06/30/2010  . HYPERTENSION 06/30/2010  . ALLERGIC RHINITIS 06/30/2010    perennial  . GERD 06/30/2010  . IBS 06/30/2010  . Headache(784.0) 06/30/2010    migraines  . Internal hemorrhoids   . Coughing up blood 08/13/2012    piece of tissue? will send to path poss from upper airway sinusitis  Final path was "abscess"   . Varicose veins   . Hepatic steatosis   . Renal lesion   . Allergy     seasonal    Family History  Problem Relation Age of Onset  . Colon cancer Mother     age 88  . Arthritis Brother   . Other Brother     Social History   Social History  . Marital Status: Widowed    Spouse Name: N/A  . Number of Children: 2  . Years of Education: N/A   Occupational History  . retired    Social History Main Topics  . Smoking status: Former Smoker    Types: Cigarettes    Quit date: 09/06/1976  . Smokeless tobacco: Never Used  . Alcohol Use: No     Comment: rarely  . Drug Use: No  . Sexual Activity: Not on file   Other Topics Concern  . 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     Outpatient Prescriptions Prior to Visit  Medication Sig Dispense Refill  . atenolol (TENORMIN) 25 MG tablet Take 12.5 mg by mouth daily.    Marland Kitchen atorvastatin (LIPITOR) 20 MG tablet TAKE 1 TABLET (20 MG TOTAL) BY MOUTH DAILY. 90 tablet 2  . dicyclomine (BENTYL) 20 MG tablet Take 1 tablet (20 mg total) by mouth 2 (two) times daily. 180 tablet 3  . Multiple Vitamins-Minerals (MULTIVITAMIN ADULT PO) Take by mouth.    Marland Kitchen omeprazole (PRILOSEC OTC) 20 MG tablet Take 20 mg by mouth daily.    Marland Kitchen OVER THE COUNTER MEDICATION Reported on 12/01/2015    . rizatriptan (MAXALT) 10 MG tablet TAKE 1 TABLET BY MOUTH AS NEEDED FOR MIGRAINE, MAY REPEAT IN 2 HOURS IF NEEDED 36 tablet 0  . traMADol (ULTRAM) 50 MG tablet TAKE 1 TABLET BY MOUTH EVERY 6 HOURS AS NEEDED FOR PAIN 30 tablet 0  . hydrocortisone (ANUSOL-HC) 2.5 % rectal cream Place 1 application rectally 2 (two) times daily  as needed for hemorrhoids or itching. (Patient not taking: Reported on 01/01/2016) 30 g 1  . amoxicillin-clavulanate (AUGMENTIN) 875-125 MG tablet Take 1 tablet by mouth every 12 (twelve) hours. 14 tablet 0  . rizatriptan (MAXALT) 10 MG tablet TAKE 1 TABLET (10 MG TOTAL) BY MOUTH AS NEEDED FOR MIGRAINE. MAY REPEAT IN 2 HOURS IF NEEDED 36 tablet 0   No facility-administered medications prior to visit.     EXAM:  BP 130/72 mmHg  Temp(Src) 98.3 F (36.8 C) (Oral)  Ht 5\' 5"  (1.651 m)  Wt 179 lb 12.8 oz (81.557 kg)  BMI 29.92 kg/m2  Body mass index is 29.92 kg/(m^2).  GENERAL: vitals reviewed and listed above, alert, oriented, appears well hydrated and in no acute distress HEENT: atraumatic, conjunctiva  clear, no obvious abnormalities on inspection of external nose and ears CV: HRRR, no clubbing cyanosis minperipheral edema nl cap refill   Has vv  MS: moves all extremities without noticeable focal  Abnormality Skin : popliteal fossa has 3 mm heaped up red papule center no fb  seen and pink nodules next to it .  PSYCH: pleasant and cooperative, no obvious depression or anxiety  ASSESSMENT AND PLAN:  Discussed the following assessment and plan:  Tick bite - reasonable to do prophlyaxisprolonged attachment rx given  local carea fu for alarm sx   "Ankle swelling", unspecified laterality - minimal has vv and no acute findingsplan labs and cpx MW in july diiuretic nNI at this time   -Patient advised to return or notify health care team  if symptoms worsen ,persist or new concerns arise.  Patient Instructions  Can take tick bite prophylaxis . Contact us if fever .  Increase  Redness.   Get cpx with labs in July  consider  Compression stocking s in day  .    Tick Bite Information Ticks are insects that attach themselves to the skin and draw blood for food. There are various types of ticks. Common types include wood ticks and deer ticks. Most ticks live in shrubs and grassy areas. Ticks can climb onto your body when you make contact with leaves or grass where the tick is waiting. The most common places on the body for ticks to attach themselves are the scalp, neck, armpits, waist, and groin. Most tick bites are harmless, but sometimes ticks carry germs that cause diseases. These germs can be spread to a person during the tick's feeding process. The chance of a disease spreading through a tick bite depends on:   The type of tick.  Time of year.   How long the tick is attached.   Geographic location.  HOW CAN YOU PREVENT TICK BITES? Take these steps to help prevent tick bites when you are outdoors:  Wear protective clothing. Long sleeves and long pants are best.   Wear white clothes so you can see ticks more easily.  Tuck your pant legs into your socks.   If walking on a trail, stay in the middle of the trail to avoid brushing against bushes.  Avoid walking through areas with long grass.  Put insect repellent on all exposed skin and along boot  tops, pant legs, and sleeve cuffs.   Check clothing, hair, and skin repeatedly and before going inside.   Brush off any ticks that are not attached.  Take a shower or bath as soon as possible after being outdoors.  WHAT IS THE PROPER WAY TO REMOVE A TICK? Ticks should be removed as soon as  possible to help prevent diseases caused by tick bites. 1. If latex gloves are available, put them on before trying to remove a tick.  2. Using fine-point tweezers, grasp the tick as close to the skin as possible. You may also use curved forceps or a tick removal tool. Grasp the tick as close to its head as possible. Avoid grasping the tick on its body. 3. Pull gently with steady upward pressure until the tick lets go. Do not twist the tick or jerk it suddenly. This may break off the tick's head or mouth parts. 4. Do not squeeze or crush the tick's body. This could force disease-carrying fluids from the tick into your body.  5. After the tick is removed, wash the bite area and your hands with soap and water or other disinfectant such as alcohol. 6. Apply a small amount of antiseptic cream or ointment to the bite site.  7. Wash and disinfect any instruments that were used.  Do not try to remove a tick by applying a hot match, petroleum jelly, or fingernail polish to the tick. These methods do not work and may increase the chances of disease being spread from the tick bite.  WHEN SHOULD YOU SEEK MEDICAL CARE? Contact your health care provider if you are unable to remove a tick from your skin or if a part of the tick breaks off and is stuck in the skin.  After a tick bite, you need to be aware of signs and symptoms that could be related to diseases spread by ticks. Contact your health care provider if you develop any of the following in the days or weeks after the tick bite:  Unexplained fever.  Rash. A circular rash that appears days or weeks after the tick bite may indicate the possibility of Lyme  disease. The rash may resemble a target with a bull's-eye and may occur at a different part of your body than the tick bite.  Redness and swelling in the area of the tick bite.   Tender, swollen lymph glands.   Diarrhea.   Weight loss.   Cough.   Fatigue.   Muscle, joint, or bone pain.   Abdominal pain.   Headache.   Lethargy or a change in your level of consciousness.  Difficulty walking or moving your legs.   Numbness in the legs.   Paralysis.  Shortness of breath.   Confusion.   Repeated vomiting.    This information is not intended to replace advice given to you by your health care provider. Make sure you discuss any questions you have with your health care provider.   Document Released: 08/20/2000 Document Revised: 09/13/2014 Document Reviewed: 01/31/2013 Elsevier Interactive Patient Education 2016 Allyn K. Panosh M.D.

## 2016-01-20 ENCOUNTER — Ambulatory Visit (INDEPENDENT_AMBULATORY_CARE_PROVIDER_SITE_OTHER): Payer: Medicare Other | Admitting: Internal Medicine

## 2016-01-20 ENCOUNTER — Encounter: Payer: Self-pay | Admitting: Internal Medicine

## 2016-01-20 VITALS — BP 132/90 | HR 74 | Temp 98.1°F | Ht 65.0 in | Wt 178.0 lb

## 2016-01-20 DIAGNOSIS — J0191 Acute recurrent sinusitis, unspecified: Secondary | ICD-10-CM | POA: Diagnosis not present

## 2016-01-20 DIAGNOSIS — J302 Other seasonal allergic rhinitis: Secondary | ICD-10-CM

## 2016-01-20 MED ORDER — AMOXICILLIN-POT CLAVULANATE 875-125 MG PO TABS: 1.0000 | ORAL_TABLET | Freq: Two times a day (BID) | ORAL | Status: DC

## 2016-01-20 NOTE — Progress Notes (Signed)
Chief Complaint  Patient presents with  . Nasal Congestion    x 2 weeks  . Cough    productive  . Sore Throat  . Headache    HPI: Michele Monroe 70 y.o.  Patient Michele Monroe  comes in today for SDA for  new problem evaluation. Onset allergy sx for 2 weeks and was taking allergy pill and mucinex   And was doing better and then developed sinus pressure and  Congestion and tired  Yellow phlegm and mucous  Like a sinusinfection ROS: See pertinent positives and negatives per HPI.  Past Medical History  Diagnosis Date  . VITAMIN D DEFICIENCY 06/30/2010  . HYPERLIPIDEMIA 06/30/2010  . HYPERTENSION 06/30/2010  . ALLERGIC RHINITIS 06/30/2010    perennial  . GERD 06/30/2010  . IBS 06/30/2010  . Headache(784.0) 06/30/2010    migraines  . Internal hemorrhoids   . Coughing up blood 08/13/2012    piece of tissue? will send to path poss from upper airway sinusitis  Final path was "abscess"   . Varicose veins   . Hepatic steatosis   . Renal lesion   . Allergy     seasonal    Family History  Problem Relation Age of Onset  . Colon cancer Mother     age 41  . Arthritis Brother   . Other Brother     Social History   Social History  . Marital Status: Widowed    Spouse Name: N/A  . Number of Children: 2  . Years of Education: N/A   Occupational History  . retired    Social History Main Topics  . Smoking status: Former Smoker    Types: Cigarettes    Quit date: 09/06/1976  . Smokeless tobacco: Never Used  . Alcohol Use: No     Comment: rarely  . Drug Use: No  . Sexual Activity: Not Asked   Other Topics Concern  . 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     Outpatient Prescriptions Prior to Visit  Medication Sig Dispense Refill  . atenolol (TENORMIN) 25 MG tablet Take 12.5 mg by mouth  daily.    Marland Kitchen atorvastatin (LIPITOR) 20 MG tablet TAKE 1 TABLET (20 MG TOTAL) BY MOUTH DAILY. 90 tablet 2  . hydrocortisone (ANUSOL-HC) 2.5 % rectal cream Place 1 application rectally 2 (two) times daily as needed for hemorrhoids or itching. 30 g 1  . Multiple Vitamins-Minerals (MULTIVITAMIN ADULT PO) Take by mouth.    Marland Kitchen omeprazole (PRILOSEC OTC) 20 MG tablet Take 20 mg by mouth daily.    Marland Kitchen OVER THE COUNTER MEDICATION Reported on 12/01/2015    . rizatriptan (MAXALT) 10 MG tablet TAKE 1 TABLET BY MOUTH AS NEEDED FOR MIGRAINE, MAY REPEAT IN 2 HOURS IF NEEDED 36 tablet 0  . traMADol (ULTRAM) 50 MG tablet TAKE 1 TABLET BY MOUTH EVERY 6 HOURS AS NEEDED FOR PAIN 30 tablet 0  . dicyclomine (BENTYL) 20 MG tablet Take 1 tablet (20 mg total) by mouth 2 (two) times daily. 180 tablet 3  . doxycycline (VIBRA-TABS) 100 MG tablet Take 2 po x 1 for tick prophyaxis . 2 tablet 2   No facility-administered medications prior to visit.  EXAM:  BP 132/90 mmHg  Pulse 74  Temp(Src) 98.1 F (36.7 C) (Oral)  Ht 5\' 5"  (1.651 m)  Wt 178 lb (80.74 kg)  BMI 29.62 kg/m2  SpO2 96%  Body mass index is 29.62 kg/(m^2).  GENERAL: vitals reviewed and listed above, alert, oriented, appears well hydrated and in no acute distress HEENT: atraumatic, conjunctiva  clear, no obvious abnormalities on inspection of external nose and ears tms nl  Nares congeseted  2+ left face pain tenderness  OP : no lesion edema or exudate  NECK: no obvious masses on inspection palpation  LUNGS: clear to auscultation bilaterally, no wheezes, rales or rhonchi, good air movement MS: moves all extremities without noticeable focal  abnormality PSYCH: pleasant and cooperative, no obvious depression or anxiety  ASSESSMENT AND PLAN:  Discussed the following assessment and plan:  Acute recurrent sinusitis, unspecified location  Seasonal rhinitis  -Patient advised to return or notify health care team  if symptoms worsen ,persist or new  concerns arise.  Patient Instructions  Treatment for sinuitis  Nasal saline   To help decongestion.  If the future if getting allergy sx then  Try again to add    flonase or nasacort   Early and use  Every day to see  If infection is prevented.        [name]. Teofilo Lupinacci M.D.

## 2016-01-20 NOTE — Patient Instructions (Signed)
Treatment for sinuitis  Nasal saline   To help decongestion.  If the future if getting allergy sx then  Try again to add    flonase or nasacort   Early and use  Every day to see  If infection is prevented.

## 2016-02-27 ENCOUNTER — Telehealth: Payer: Self-pay | Admitting: Internal Medicine

## 2016-02-27 NOTE — Telephone Encounter (Signed)
Noted  

## 2016-02-27 NOTE — Telephone Encounter (Signed)
Patient received a jury summons, however she stated she is unable to go due to her IBS.  She had an accident with diarrhea last time she had jury duty.  Patient needs a note to send to the court so she can be excused from jury duty.  I put her jury summons in Dr. Velora Mediate folder.

## 2016-03-02 ENCOUNTER — Other Ambulatory Visit: Payer: Self-pay | Admitting: Internal Medicine

## 2016-03-03 NOTE — Telephone Encounter (Signed)
Sent to the pharmacy by e-scribe.  Pt has upcoming cpx on 06/15/16

## 2016-03-04 ENCOUNTER — Telehealth: Payer: Self-pay | Admitting: Internal Medicine

## 2016-03-04 NOTE — Telephone Encounter (Signed)
Patient dropped off a jury duty notice and needed a note to be excused for jury duty.  She dropped it off the beginning of last week and needed to mail it off by today (03/04/16) but has not heard anything back about the paperwork.  She wants you to give her a call.  She needs to get it in the mail tomorrow at the latest.

## 2016-03-05 ENCOUNTER — Encounter: Payer: Self-pay | Admitting: Family Medicine

## 2016-03-05 NOTE — Telephone Encounter (Signed)
Left a message that the letter will be available for pick up after 1:30 this afternoon.  Advised a call back if any questions.

## 2016-03-08 ENCOUNTER — Other Ambulatory Visit: Payer: Self-pay | Admitting: Internal Medicine

## 2016-03-08 NOTE — Telephone Encounter (Signed)
DUPLICATE REQUEST.  FILLED FOR 6 MONTHS ON 03/03/2016

## 2016-04-01 DIAGNOSIS — M5136 Other intervertebral disc degeneration, lumbar region: Secondary | ICD-10-CM | POA: Diagnosis not present

## 2016-04-01 DIAGNOSIS — M25552 Pain in left hip: Secondary | ICD-10-CM | POA: Diagnosis not present

## 2016-04-24 ENCOUNTER — Other Ambulatory Visit: Payer: Self-pay | Admitting: Internal Medicine

## 2016-04-27 NOTE — Telephone Encounter (Signed)
Sent to the pharmacy by e-scribe. 

## 2016-04-27 NOTE — Telephone Encounter (Signed)
Ok to refill x 1  

## 2016-05-07 ENCOUNTER — Other Ambulatory Visit: Payer: Self-pay | Admitting: Internal Medicine

## 2016-05-10 NOTE — Telephone Encounter (Signed)
Ok to refill x 1 disp 30#

## 2016-05-11 NOTE — Telephone Encounter (Signed)
Called to the pharmacy and left on machine. 

## 2016-05-24 ENCOUNTER — Other Ambulatory Visit: Payer: Self-pay | Admitting: Internal Medicine

## 2016-05-26 NOTE — Telephone Encounter (Signed)
Sent to the pharmacy by e-scribe.  Pt has upcoming cpx on 06/15/16

## 2016-06-10 DIAGNOSIS — H2513 Age-related nuclear cataract, bilateral: Secondary | ICD-10-CM | POA: Diagnosis not present

## 2016-06-14 NOTE — Progress Notes (Signed)
Pre visit review using our clinic review tool, if applicable. No additional management support is needed unless otherwise documented below in the visit note.  Chief Complaint  Patient presents with  . Medicare Wellness    HPI: Michele Monroe 70 y.o. comes in today for Preventive Medicare wellness visit . And med evaluation Headache getw with   barometer  Change  Uses med   Helps  Tramadol use   Shoulders neck .   ocassinal .  Bug bites  Dog good  hles with grand children  Lipid med s no obv swe bp has been on atenolol for a while and doing well.  Using prilosec qd  Gets burning if stops 1 day  Health Maintenance  Topic Date Due  . Hepatitis C Screening  06/14/2017 (Originally 11-19-1945)  . TETANUS/TDAP  09/06/2017  . MAMMOGRAM  09/30/2017  . COLONOSCOPY  07/09/2020  . INFLUENZA VACCINE  Completed  . DEXA SCAN  Completed  . ZOSTAVAX  Completed  . PNA vac Low Risk Adult  Completed   Health Maintenance Review LIFESTYLE:  TADn Sugar beverages: pepsi w stomach upset  Sleep: 8 + hours   MEDICARE DOCUMENT QUESTIONS  TO SCAN   Hearing: ok some tinnitus  Hearing pass   Vision:  No limitations at present . Last eye check UTD early cataract   Safety:  Has smoke detector and wears seat belts.  No firearms. No excess sun exposure. Sees dentist regularly.  Falls: no  Advance directive :  Reviewed  doesn have one  Hand out .  Memory: Felt to be good  , no concern from her or her family.  Depression: No anhedonia unusual crying or depressive symptoms  Nutrition: Eats well balanced diet; adequate calcium and vitamin D. No swallowing chewing problems.  Injury: no major injuries in the last six months.  Other healthcare providers:  Reviewed today .  Social:  Lives    With pet dogs .   Preventive parameters: up-to-date  Reviewed   ADLS:   There are no problems or need for assistance  driving, feeding, obtaining food, dressing, toileting and bathing, managing money using phone.  She is independent.   ROS:   See hpi   GEN/ HEENT: No fever, significant weight changes sweats headaches vision problems hearing changes, CV/ PULM; No chest pain shortness of breath cough, syncope,edema  change in exercise tolerance. GI /GU: No adominal pain, vomiting, change in bowel habits. No blood in the stool. No significant GU symptoms. SKIN/HEME: ,no acute skin rashes suspicious lesions or bleeding. No lymphadenopathy, nodules, masses.  NEURO/ PSYCH:  No neurologic signs such as weakness numbness. No depression anxiety. IMM/ Allergy: No unusual infections.  Allergy .   REST of 12 system review negative except as per HPI   Past Medical History:  Diagnosis Date  . ALLERGIC RHINITIS 06/30/2010   perennial  . Allergy    seasonal  . Coughing up blood 08/13/2012   piece of tissue? will send to path poss from upper airway sinusitis  Final path was "abscess"   . GERD 06/30/2010  . Headache(784.0) 06/30/2010   migraines  . Hepatic steatosis   . HYPERLIPIDEMIA 06/30/2010  . HYPERTENSION 06/30/2010  . IBS 06/30/2010  . Internal hemorrhoids   . Renal lesion   . Varicose veins   . VITAMIN D DEFICIENCY 06/30/2010    Family History  Problem Relation Age of Onset  . Colon cancer Mother     age 45  . Arthritis Brother   .  Other Brother     Social History   Social History  . Marital status: Widowed    Spouse name: N/A  . Number of children: 2  . Years of education: N/A   Occupational History  . retired    Social History Main Topics  . Smoking status: Former Smoker    Types: Cigarettes    Quit date: 09/06/1976  . Smokeless tobacco: Never Used  . Alcohol use No     Comment: rarely  . Drug use: No  . Sexual activity: Not Asked   Other Topics Concern  . 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     Outpatient Encounter Prescriptions as of 06/15/2016  Medication Sig  . atenolol (TENORMIN) 25 MG tablet Take 12.5 mg by mouth daily.  Marland Kitchen atorvastatin (LIPITOR) 20 MG tablet TAKE 1 TABLET (20 MG TOTAL) BY MOUTH DAILY.  . Multiple Vitamins-Minerals (MULTIVITAMIN ADULT PO) Take by mouth.  Marland Kitchen omeprazole (PRILOSEC OTC) 20 MG tablet Take 20 mg by mouth daily.  Marland Kitchen OVER THE COUNTER MEDICATION Reported on 12/01/2015  . rizatriptan (MAXALT) 10 MG tablet TAKE 1 TABLET BY MOUTH AS NEEDED FOR MIGRAINE, MAY REPEAT IN 2 HOURS IF NEEDED  . traMADol (ULTRAM) 50 MG tablet TAKE 1 TABLET BY MOUTH EVERY 6 HOURS AS NEEDED FOR PAIN  . [DISCONTINUED] atorvastatin (LIPITOR) 20 MG tablet TAKE 1 TABLET (20 MG TOTAL) BY MOUTH DAILY.  . [DISCONTINUED] amoxicillin-clavulanate (AUGMENTIN) 875-125 MG tablet Take 1 tablet by mouth every 12 (twelve) hours. For sinusitis  . [DISCONTINUED] atenolol (TENORMIN) 25 MG tablet TAKE 1 TABLET BY MOUTH EVERY DAY FOR BLOOD PRESSURE/MIGRAINE  . [DISCONTINUED] hydrocortisone (ANUSOL-HC) 2.5 % rectal cream Place 1 application rectally 2 (two) times daily as needed for hemorrhoids or itching.   No facility-administered encounter medications on file as of 06/15/2016.     EXAM:  BP 124/78 (BP Location: Right Arm, Patient Position: Sitting, Cuff Size: Normal)   Temp 98.2 F (36.8 C) (Oral)   Ht 5' 5.25" (1.657 m)   Wt 173 lb 3.2 oz (78.6 kg)   BMI 28.60 kg/m   Body mass index is 28.6 kg/m.  Physical Exam: Vital signs reviewed [id number] is a well-developed well-nourished alert cooperative   who appears stated age in no acute distress.  HEENT: normocephalic atraumatic , Eyes: PERRL EOM's full, conjunctiva clear, Nares: paten,t no deformity discharge or tenderness., Ears: no deformity EAC's clear TMs with normal landmarks. Mouth: clear OP, no lesions, edema.  Moist mucous membranes. Dentition in adequate repair. NECK: supple without masses, thyromegaly or bruits. CHEST/PULM:   Clear to auscultation and percussion breath sounds equal no wheeze , rales or rhonchi. No chest wall deformities or tenderness.Breast: normal by inspection . No dimpling, discharge, masses, tenderness or discharge . CV: PMI is nondisplaced, S1 S2 no gallops, murmurs, rubs. Peripheral pulses are full without delay.No JVD .  ABDOMEN: Bowel sounds normal nontender  No guard or rebound, no hepato splenomegal no CVA tenderness.   Extremtities:  No clubbing cyanosis or edema, no acute joint swelling or redness no focal atrophy has varicose eins  NEURO:  Oriented x3, cranial nerves 3-12 appear to be intact, no obvious focal weakness,gait within normal limits no  abnormal reflexes or asymmetrical SKIN: No acute rashes normal turgor, color, no bruising or petechiae.  Papule red lef t breast fading ? Bug bites   PSYCH: Oriented, good eye contact, no obvious depression anxiety, cognition and judgment appear normal. LN: no cervical axillary inguinal adenopathy No noted deficits in memory, attention, and speech.     ASSESSMENT AND PLAN:  Discussed the following assessment and plan:  Visit for preventive health examination - Plan: Basic metabolic panel, Lipid panel, Hepatic function panel, TSH, CBC with Differential/Platelet  Medicare annual wellness visit, subsequent - advance directive HO given reveiwed   Medication management - Plan: Basic metabolic panel, Lipid panel, Hepatic function panel, TSH, CBC with Differential/Platelet  Essential hypertension - Plan: Basic metabolic panel, Lipid panel, Hepatic function panel, TSH, CBC with Differential/Platelet  Hyperlipidemia, unspecified hyperlipidemia type - Plan: Basic metabolic panel, Lipid panel, Hepatic function panel, TSH, CBC with Differential/Platelet Varicose veins poss cause of swelling   Has triggere byu barometric pressure changes and meds work  Patient Care Team: Burnis Medin, MD as PCP - General Lafayette Dragon, MD (Inactive) as Attending  Physician (Gastroenterology)  Patient Instructions  Will notify you  of labs when available.  Continue lifestyle intervention healthy eating and exercise . Avoid sugar beverages as possible.  Try alternating  prilosec with ranitidine 150 mg twice a day to see if  That still controls stomach acid.   ROV depending on labs and how you are doing or  As needed.   Can make  wellness visit  Next year    with Wynetta Fines our health coach ( still do  physical and med labs appointments  with me.)   Keep up t o date with colon cancer screening   Look at advanced directive  .        [name]. Panosh M.D.

## 2016-06-15 ENCOUNTER — Encounter: Payer: Self-pay | Admitting: Internal Medicine

## 2016-06-15 ENCOUNTER — Ambulatory Visit (INDEPENDENT_AMBULATORY_CARE_PROVIDER_SITE_OTHER): Payer: Medicare Other | Admitting: Internal Medicine

## 2016-06-15 VITALS — BP 124/78 | Temp 98.2°F | Ht 65.25 in | Wt 173.2 lb

## 2016-06-15 DIAGNOSIS — Z0000 Encounter for general adult medical examination without abnormal findings: Secondary | ICD-10-CM | POA: Diagnosis not present

## 2016-06-15 DIAGNOSIS — I10 Essential (primary) hypertension: Secondary | ICD-10-CM

## 2016-06-15 DIAGNOSIS — Z79899 Other long term (current) drug therapy: Secondary | ICD-10-CM | POA: Diagnosis not present

## 2016-06-15 DIAGNOSIS — E785 Hyperlipidemia, unspecified: Secondary | ICD-10-CM | POA: Diagnosis not present

## 2016-06-15 LAB — CBC WITH DIFFERENTIAL/PLATELET
Basophils Absolute: 0.1 10*3/uL (ref 0.0–0.1)
Basophils Relative: 0.8 % (ref 0.0–3.0)
EOS PCT: 3.6 % (ref 0.0–5.0)
Eosinophils Absolute: 0.3 10*3/uL (ref 0.0–0.7)
HCT: 40.7 % (ref 36.0–46.0)
Hemoglobin: 13.8 g/dL (ref 12.0–15.0)
LYMPHS ABS: 2.7 10*3/uL (ref 0.7–4.0)
Lymphocytes Relative: 30.8 % (ref 12.0–46.0)
MCHC: 33.8 g/dL (ref 30.0–36.0)
MCV: 84.5 fl (ref 78.0–100.0)
MONO ABS: 0.6 10*3/uL (ref 0.1–1.0)
MONOS PCT: 6.4 % (ref 3.0–12.0)
NEUTROS ABS: 5.2 10*3/uL (ref 1.4–7.7)
NEUTROS PCT: 58.4 % (ref 43.0–77.0)
PLATELETS: 242 10*3/uL (ref 150.0–400.0)
RBC: 4.82 Mil/uL (ref 3.87–5.11)
RDW: 13.2 % (ref 11.5–15.5)
WBC: 8.9 10*3/uL (ref 4.0–10.5)

## 2016-06-15 LAB — HEPATIC FUNCTION PANEL
ALK PHOS: 75 U/L (ref 39–117)
ALT: 12 U/L (ref 0–35)
AST: 17 U/L (ref 0–37)
Albumin: 3.9 g/dL (ref 3.5–5.2)
BILIRUBIN DIRECT: 0.1 mg/dL (ref 0.0–0.3)
Total Bilirubin: 0.4 mg/dL (ref 0.2–1.2)
Total Protein: 6.9 g/dL (ref 6.0–8.3)

## 2016-06-15 LAB — LIPID PANEL
CHOL/HDL RATIO: 3
Cholesterol: 170 mg/dL (ref 0–200)
HDL: 52.2 mg/dL (ref >39.00)
LDL Cholesterol: 84 mg/dL (ref 0–99)
NONHDL: 117.56
TRIGLYCERIDES: 168 mg/dL — AB (ref 0.0–149.0)
VLDL: 33.6 mg/dL (ref 0.0–40.0)

## 2016-06-15 LAB — BASIC METABOLIC PANEL
BUN: 13 mg/dL (ref 6–23)
CO2: 30 meq/L (ref 19–32)
Calcium: 9.7 mg/dL (ref 8.4–10.5)
Chloride: 102 mEq/L (ref 96–112)
Creatinine, Ser: 0.9 mg/dL (ref 0.40–1.20)
GFR: 65.98 mL/min (ref >60.00)
GLUCOSE: 93 mg/dL (ref 70–99)
POTASSIUM: 4.5 meq/L (ref 3.5–5.1)
SODIUM: 140 meq/L (ref 135–145)

## 2016-06-15 LAB — TSH: TSH: 1.33 u[IU]/mL (ref 0.35–4.50)

## 2016-06-15 MED ORDER — ATORVASTATIN CALCIUM 20 MG PO TABS: ORAL_TABLET | ORAL | 3 refills | Status: DC

## 2016-06-15 NOTE — Patient Instructions (Addendum)
Will notify you  of labs when available.  Continue lifestyle intervention healthy eating and exercise . Avoid sugar beverages as possible.  Try alternating  prilosec with ranitidine 150 mg twice a day to see if  That still controls stomach acid.   ROV depending on labs and how you are doing or  As needed.   Can make  wellness visit  Next year    with Wynetta Fines our health coach ( still do  physical and med labs appointments  with me.)   Keep up t o date with colon cancer screening   Look at advanced directive  .

## 2016-06-24 ENCOUNTER — Telehealth: Payer: Self-pay | Admitting: Internal Medicine

## 2016-06-24 DIAGNOSIS — R519 Headache, unspecified: Secondary | ICD-10-CM

## 2016-06-24 DIAGNOSIS — R51 Headache: Principal | ICD-10-CM

## 2016-06-24 NOTE — Telephone Encounter (Signed)
Ok to refer.

## 2016-06-24 NOTE — Telephone Encounter (Signed)
Pt would like a referral to [facility] in [place] or [place] if sooner.  Pt states she is having ongoing headaches,, tingling in fingers while on the phone, shoulder and neck pain all the time.  Pt states pain meds are not working.

## 2016-06-25 NOTE — Telephone Encounter (Signed)
I suggest she see a headache specialist   First   May we refer to  Headache /neuro specialsit ?

## 2016-06-29 ENCOUNTER — Other Ambulatory Visit: Payer: Self-pay | Admitting: *Deleted

## 2016-06-29 MED ORDER — DICYCLOMINE HCL 20 MG PO TABS: 20.0000 mg | ORAL_TABLET | Freq: Two times a day (BID) | ORAL | 3 refills | Status: DC

## 2016-06-29 NOTE — Telephone Encounter (Signed)
BENTYL SENT PER FAX REQUEST FROM PHARMACY

## 2016-06-29 NOTE — Telephone Encounter (Signed)
LMTCB

## 2016-06-30 NOTE — Telephone Encounter (Signed)
Left a message for a return call.

## 2016-07-01 ENCOUNTER — Ambulatory Visit (INDEPENDENT_AMBULATORY_CARE_PROVIDER_SITE_OTHER): Payer: Medicare Other | Admitting: Internal Medicine

## 2016-07-01 ENCOUNTER — Ambulatory Visit (INDEPENDENT_AMBULATORY_CARE_PROVIDER_SITE_OTHER)
Admission: RE | Admit: 2016-07-01 | Discharge: 2016-07-01 | Disposition: A | Discharge status: Home / Self Care | Payer: Medicare Other | Source: Ambulatory Visit | Attending: Internal Medicine | Admitting: Internal Medicine

## 2016-07-01 ENCOUNTER — Encounter: Payer: Self-pay | Admitting: Internal Medicine

## 2016-07-01 VITALS — BP 112/72 | HR 68 | Temp 98.6°F | Ht 65.25 in | Wt 172.9 lb

## 2016-07-01 DIAGNOSIS — J40 Bronchitis, not specified as acute or chronic: Secondary | ICD-10-CM

## 2016-07-01 DIAGNOSIS — J22 Unspecified acute lower respiratory infection: Secondary | ICD-10-CM

## 2016-07-01 DIAGNOSIS — Z8709 Personal history of other diseases of the respiratory system: Secondary | ICD-10-CM | POA: Diagnosis not present

## 2016-07-01 DIAGNOSIS — R042 Hemoptysis: Secondary | ICD-10-CM

## 2016-07-01 DIAGNOSIS — R062 Wheezing: Secondary | ICD-10-CM

## 2016-07-01 MED ORDER — AZITHROMYCIN 500 MG PO TABS: 500.0000 mg | ORAL_TABLET | Freq: Every day | ORAL | 0 refills | Status: DC

## 2016-07-01 MED ORDER — IPRATROPIUM-ALBUTEROL 0.5-2.5 (3) MG/3ML IN SOLN
3.0000 mL | Freq: Once | RESPIRATORY_TRACT | Status: AC
  Administered 2016-07-01: 3 mL via RESPIRATORY_TRACT

## 2016-07-01 MED ORDER — HYDROCODONE-HOMATROPINE 5-1.5 MG/5ML PO SYRP: ORAL_SOLUTION | ORAL | 0 refills | Status: DC

## 2016-07-01 NOTE — Patient Instructions (Signed)
Your original illness is most likely of viral respiratory infection may usually gets better on its own. However your chest is concerning for either a bacterial bronchitis or early pneumonia. Get a chest x-ray today. I will send in an antibiotic based on the x-ray report. Cough med can be used for comfort continue liquids rest. If you get very short of breath sdeek emergent   care. These chest infections usually get better in a week to 10 days area

## 2016-07-01 NOTE — Progress Notes (Signed)
Pre visit review using our clinic review tool, if applicable. No additional management support is needed unless otherwise documented below in the visit note. 

## 2016-07-01 NOTE — Telephone Encounter (Signed)
Spoke to the pt.  She agreed to neuro.  Order placed in the system.

## 2016-07-01 NOTE — Progress Notes (Signed)
Chief Complaint  Patient presents with  . Cough    Recently traveled to [place].  . Headache  . Nasal Congestion  . Shortness of Breath  . Wheezing    HPI: Michele Monroe 70 y.o. comes in today for shortness of breath chest and head congestion wheezing. Last week visit in [facility] look up with a sore throat and then runny nose body aches and malaise. Developed a dry cough that is now wheezing over the last 2 days productive clear to yellow this morning had a little blood. No specific fever chills but is short of breath on exertion. She found out to other relatives did get a cough and respiratory symptoms but not wheezing or short of breath. She has a history of recurrent sinusitis and does have some face pain. Some over-the-counter. Has used inhalers in the past and they didn't help her.  ROS: See pertinent positives and negatives per HPI. No chest pain or pleuritic pain.  Past Medical History:  Diagnosis Date  . ALLERGIC RHINITIS 06/30/2010   perennial  . Allergy    seasonal  . Coughing up blood 08/13/2012   piece of tissue? will send to path poss from upper airway sinusitis  Final path was "abscess"   . GERD 06/30/2010  . Headache(784.0) 06/30/2010   migraines  . Hepatic steatosis   . HYPERLIPIDEMIA 06/30/2010  . HYPERTENSION 06/30/2010  . IBS 06/30/2010  . Internal hemorrhoids   . Renal lesion   . Varicose veins   . VITAMIN D DEFICIENCY 06/30/2010    Family History  Problem Relation Age of Onset  . Colon cancer Mother     age 49  . Arthritis Brother   . Other Brother     Social History   Social History  . Marital status: Widowed    Spouse name: N/A  . Number of children: 2  . Years of education: N/A   Occupational History  . retired    Social History Main Topics  . Smoking status: Former Smoker    Types: Cigarettes    Quit date: 09/06/1976  . Smokeless tobacco: Never Used  . Alcohol use No     Comment: rarely  . Drug  use: No  . Sexual activity: Not Asked   Other Topics Concern  . 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     Outpatient Medications Prior to Visit  Medication Sig Dispense Refill  . atenolol (TENORMIN) 25 MG tablet Take 12.5 mg by mouth daily.    Marland Kitchen atorvastatin (LIPITOR) 20 MG tablet TAKE 1 TABLET (20 MG TOTAL) BY MOUTH DAILY. 90 tablet 3  . dicyclomine (BENTYL) 20 MG tablet Take 1 tablet (20 mg total) by mouth 2 (two) times daily. 180 tablet 3  . Multiple Vitamins-Minerals (MULTIVITAMIN ADULT PO) Take by mouth.    Marland Kitchen omeprazole (PRILOSEC OTC) 20 MG tablet Take 20 mg by mouth daily.    Marland Kitchen OVER THE COUNTER MEDICATION Reported on 12/01/2015    . rizatriptan (MAXALT) 10 MG tablet TAKE 1 TABLET BY MOUTH AS NEEDED FOR MIGRAINE, MAY REPEAT IN 2 HOURS IF NEEDED 36 tablet 0  . traMADol (ULTRAM) 50 MG tablet TAKE 1 TABLET  BY MOUTH EVERY 6 HOURS AS NEEDED FOR PAIN 30 tablet 0   No facility-administered medications prior to visit.      EXAM:  BP 112/72 (BP Location: Right Arm, Patient Position: Sitting, Cuff Size: Normal)   Pulse 68   Temp 98.6 F (37 C) (Oral)   Ht 5' 5.25" (1.657 m)   Wt 172 lb 14.4 oz (78.4 kg)   SpO2 96%   BMI 28.55 kg/m   Body mass index is 28.55 kg/m.  GENERAL: vitals reviewed and listed above, alert, oriented, appears well hydrated and in no acute distress cough  Congested non toxic   HEENT: atraumatic, conjunctiva  clear, no obvious abnormalities on inspection of external nose and earscongested face tms clear  OP : no lesion edema or exudate  NECK: no obvious masses on inspection palpation  LUNGS:  Bilateral Musical wheezes and rhonchi. No retractions. Exam was after nebulizer with DuoNeb. coudh clear to yellow   Streak of blood earlier witness by cma  CV: HRRR, no clubbing cyanosis  or  peripheral edema nl cap refill  MS: moves all extremities without noticeable focal  abnormality PSYCH: pleasant and cooperative, no obvious depression or anxiety  ASSESSMENT AND PLAN:  Discussed the following assessment and plan:  Lower respiratory infection (e.g., bronchitis, pneumonia, pneumonitis, pulmonitis) - Plan: DG Chest 2 View  Wheezing - Plan: ipratropium-albuterol (DUONEB) 0.5-2.5 (3) MG/3ML nebulizer solution 3 mL, DG Chest 2 View  Wheezy bronchitis - Plan: DG Chest 2 View  Blood in sputum - x 1 streak  poss from coughing    Hx of sinusitis Primary illness seems viral but lung exam is impressive. No dramatic improvement with nebulizer although may be looser. Small episode of blood that could've been from severe coughing however will cover with antibiotics because of that history get chest x-ray today. Chest x-ray shows no acute pneumonia air space disease. Will treat with 3 days of azithromycin 500 mg empirically. Symptomatic treatment follow-up in a week if not improved or emergent care if needed. -Patient advised to return or notify health care team  if symptoms worsen ,persist or new concerns arise.  Patient Instructions  Your original illness is most likely of viral respiratory infection may usually gets better on its own. However your chest is concerning for either a bacterial bronchitis or early pneumonia. Get a chest x-ray today. I will send in an antibiotic based on the x-ray report. Cough med can be used for comfort continue liquids rest. If you get very short of breath sdeek emergent   care. These chest infections usually get better in a week to 10 days area    Victor K. Holden Maniscalco M.D.

## 2016-07-07 ENCOUNTER — Telehealth: Payer: Self-pay | Admitting: Internal Medicine

## 2016-07-07 ENCOUNTER — Other Ambulatory Visit: Payer: Self-pay | Admitting: Internal Medicine

## 2016-07-07 MED ORDER — HYDROCODONE-HOMATROPINE 5-1.5 MG/5ML PO SYRP: ORAL_SOLUTION | ORAL | 0 refills | Status: DC

## 2016-07-07 NOTE — Telephone Encounter (Signed)
Spoke to the pt.  Pt states she is no longer wheezing.  Has cough which is productive of a light yellow sputum.  Also has nasal congestion of same color.  No fever or chills.  Cough is better than before. No SOB.  No new sx.  She has completed the azithromycin (ZITHROMAX) 500 MG tablet and has completed HYDROcodone-homatropine (HYCODAN) 5-1.5 MG/5ML syrup.  Pt would like to extend antibiotics and cough syrup.  Please advise.  Thanks!!!

## 2016-07-07 NOTE — Telephone Encounter (Signed)
Can refill the cough med   But the antibiotic may still be in your system cause it is long acting should continue to improve over the next 3- 5 days   But cough may lats 1-2 weeks   contact us if relapse or not continuing to improve over the next 3-4 days

## 2016-07-07 NOTE — Telephone Encounter (Signed)
Pt notified that antibiotic is still active and should continue to see improvement over the next few days.  She will call back if not.  Pt will pick up refill of cough syrup at the front desk.

## 2016-07-07 NOTE — Telephone Encounter (Signed)
Pt was seen on 07/01/16 and is no better as was told to callback. Pt would like more abx. Pt does not have fever nor appetite.[facility]. Pt is coughing up yellow phlegm

## 2016-07-08 NOTE — Telephone Encounter (Signed)
Ok x 1  Tell her not to take this with the hydrocodone cough medicine

## 2016-07-09 NOTE — Telephone Encounter (Signed)
Called to the pharmacy and left on machine.Marland Kitchen Spoke to the pt by telephone and informed her not to take tramadol with hydrocodone cough syrup.  Patient agreed.

## 2016-08-08 ENCOUNTER — Other Ambulatory Visit: Payer: Self-pay | Admitting: Internal Medicine

## 2016-08-09 ENCOUNTER — Ambulatory Visit (INDEPENDENT_AMBULATORY_CARE_PROVIDER_SITE_OTHER): Payer: Medicare Other | Admitting: Internal Medicine

## 2016-08-09 ENCOUNTER — Encounter: Payer: Self-pay | Admitting: Internal Medicine

## 2016-08-09 VITALS — BP 128/70 | Temp 97.9°F | Ht 65.25 in | Wt 174.4 lb

## 2016-08-09 DIAGNOSIS — K529 Noninfective gastroenteritis and colitis, unspecified: Secondary | ICD-10-CM

## 2016-08-09 DIAGNOSIS — B001 Herpesviral vesicular dermatitis: Secondary | ICD-10-CM | POA: Diagnosis not present

## 2016-08-09 DIAGNOSIS — A09 Infectious gastroenteritis and colitis, unspecified: Secondary | ICD-10-CM

## 2016-08-09 DIAGNOSIS — Z79899 Other long term (current) drug therapy: Secondary | ICD-10-CM

## 2016-08-09 MED ORDER — RIZATRIPTAN BENZOATE 10 MG PO TABS: ORAL_TABLET | ORAL | 0 refills | Status: DC

## 2016-08-09 MED ORDER — VALACYCLOVIR HCL 1 G PO TABS: 2000.0000 mg | ORAL_TABLET | Freq: Two times a day (BID) | ORAL | 1 refills | Status: DC

## 2016-08-09 NOTE — Progress Notes (Signed)
Pre visit review using our clinic review tool, if applicable. No additional management support is needed unless otherwise documented below in the visit note.  Chief Complaint  Patient presents with  . Sore Throat  . Nasal Congestion  . Generalized Body Aches    HPI: Michele Monroe 70 y.o.  sda appt   For aresp sx  She has a hx of recurrent sinusitis  And was last on antibiotic x pack October 26   For poss atypicals   With sever cough and some wheeze  See last notes   She was well until  Exposed to a gi bug in family NVD 61 30 and 12 1  Over weekend  No vomiting but nausea and now uri congestion and lower lip blisters  Using brevia .    Mild cough no fever but feels  Tired and malaise . Mouth pain with spicy foods   .  wants refill maxalt send to pharmacy  ROS: See pertinent positives and negatives per HPI. No cp sob  abd pain  Blood in stol  Phlegm   Past Medical History:  Diagnosis Date  . ALLERGIC RHINITIS 06/30/2010   perennial  . Allergy    seasonal  . Coughing up blood 08/13/2012   piece of tissue? will send to path poss from upper airway sinusitis  Final path was "abscess"   . GERD 06/30/2010  . Headache(784.0) 06/30/2010   migraines  . Hepatic steatosis   . HYPERLIPIDEMIA 06/30/2010  . HYPERTENSION 06/30/2010  . IBS 06/30/2010  . Internal hemorrhoids   . Renal lesion   . Varicose veins   . VITAMIN D DEFICIENCY 06/30/2010    Family History  Problem Relation Age of Onset  . Colon cancer Mother     age 31  . Arthritis Brother   . Other Brother     Social History   Social History  . Marital status: Widowed    Spouse name: N/A  . Number of children: 2  . Years of education: N/A   Occupational History  . retired    Social History Main Topics  . Smoking status: Former Smoker    Types: Cigarettes    Quit date: 09/06/1976  . Smokeless tobacco: Never Used  . Alcohol use No     Comment: rarely  . Drug use: No  . Sexual activity: Not Asked   Other Topics  Concern  . 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     Outpatient Medications Prior to Visit  Medication Sig Dispense Refill  . atenolol (TENORMIN) 25 MG tablet Take 12.5 mg by mouth daily.    Marland Kitchen atorvastatin (LIPITOR) 20 MG tablet TAKE 1 TABLET (20 MG TOTAL) BY MOUTH DAILY. 90 tablet 3  . dicyclomine (BENTYL) 20 MG tablet Take 1 tablet (20 mg total) by mouth 2 (two) times daily. 180 tablet 3  . Multiple Vitamins-Minerals (MULTIVITAMIN ADULT PO) Take by mouth.    Marland Kitchen omeprazole (PRILOSEC OTC) 20 MG tablet Take 20 mg by mouth daily.    Marland Kitchen OVER THE COUNTER MEDICATION Reported on 12/01/2015    . traMADol (ULTRAM) 50 MG tablet TAKE 1 TABLET BY MOUTH EVERY 6 HOURS AS NEEDED FOR PAIN 30  tablet 0  . rizatriptan (MAXALT) 10 MG tablet TAKE 1 TABLET BY MOUTH AS NEEDED FOR MIGRAINE, MAY REPEAT IN 2 HOURS IF NEEDED 36 tablet 0  . azithromycin (ZITHROMAX) 500 MG tablet Take 1 tablet (500 mg total) by mouth daily. 3 tablet 0  . HYDROcodone-homatropine (HYCODAN) 5-1.5 MG/5ML syrup 1 tsp at night or every 4-6 hours if needed for cough 120 mL 0   No facility-administered medications prior to visit.      EXAM:  BP 128/70 (BP Location: Right Arm, Patient Position: Sitting, Cuff Size: Normal)   Temp 97.9 F (36.6 C) (Oral)   Ht 5' 5.25" (1.657 m)   Wt 174 lb 6.4 oz (79.1 kg)   BMI 28.80 kg/m   Body mass index is 28.8 kg/m. WDWN in NAD  quiet respirations; mildly congested  somewhat hoarse. Non toxic . HEENT: Normocephalic ;atraumatic , Eyes;  PERRL, EOMs  Full, lids and conjunctiva clear,,Ears: no deformities, canals nl, TM landmarks normal, Nose: no deformity or discharge but congested;face minimally tender Mouth : OP clear without lesion or edema . Lower lip with numerous blisters pustules without significant edema. No  lesions posterior pharynx. Neck: Supple without adenopathy or masses or bruits Chest:  Clear to A&P without wheezes rales or rhonchi Abdomen soft without organomegaly guarding or rebound. CV:  S1-S2 no gallops or murmurs peripheral perfusion is normal Skin :nl perfusion and no acute rashes    ASSESSMENT AND PLAN:  Discussed the following assessment and plan:  Herpes labialis  Gastroenteritis, infectious, presumed - WITH URI   Medication management - ok to refill  maxalt Presumed viral infection by context and evolution. involving the GI tract and upper respiratory with secondary significant HSV lower lip. Could be affecting oral intake. But she is not dehydrated. Discussed expectant management begin Valtrex local care push fluids expectant management should include follow-up for alarm symptoms or failure to resolve in the next 5-6 days. Refilled her Maxalt as requested. -Patient advised to return or notify health care team  if symptoms worsen ,persist or new concerns arise.  Patient Instructions  Begin the Valtrex right away for the large amount of cold sores you have on the lips. This sounds like a stomach bug gastroenteritis most likely viral that is taking its course. Continue your clear liquids and soft bland foods until improved. You should feel be feeling a lot better by the end of the week. Let us know if you have fever or relapsing symptoms.    [name]. Suzanna Zahn M.D.

## 2016-08-09 NOTE — Patient Instructions (Signed)
Begin the Valtrex right away for the large amount of cold sores you have on the lips. This sounds like a stomach bug gastroenteritis most likely viral that is taking its course. Continue your clear liquids and soft bland foods until improved. You should feel be feeling a lot better by the end of the week. Let us know if you have fever or relapsing symptoms.

## 2016-08-26 ENCOUNTER — Other Ambulatory Visit: Payer: Self-pay | Admitting: Internal Medicine

## 2016-09-14 DIAGNOSIS — L02223 Furuncle of chest wall: Secondary | ICD-10-CM | POA: Diagnosis not present

## 2016-09-14 DIAGNOSIS — L0889 Other specified local infections of the skin and subcutaneous tissue: Secondary | ICD-10-CM | POA: Diagnosis not present

## 2016-10-05 ENCOUNTER — Telehealth: Payer: Self-pay | Admitting: Internal Medicine

## 2016-10-05 NOTE — Telephone Encounter (Signed)
Patient's granddaughter has been diagnosed with flu. She wants to know if she need to RX for Tamiflu. Patient has been in contact with granddaughter and will be keeping for the rest of the week. Patient states that she has had a flu shot.  Contact Info: [phone]

## 2016-10-06 NOTE — Telephone Encounter (Signed)
Pt is babysitting her granddaughter and can not come in

## 2016-10-06 NOTE — Telephone Encounter (Signed)
Prophylaxis treatment isn't that helpful. Usually do not advise  BUT If she develops body aches fever 100.4 and over and coughing illness she should contact us and Tamiflu might be helpful. At that time  Tamiflu in her 2 family members reduces the risk of transmission which is helpful.

## 2016-10-06 NOTE — Telephone Encounter (Signed)
Spoke to [place].  She is sitting with two grand children that have been diagnosed with flu.  One on Tamiflu.  Mother treated with Tamiflu.  Pt would also like to be treated.  Please advise.  Thanks!!  [facility]

## 2016-10-06 NOTE — Telephone Encounter (Signed)
Spoke to the pt and informed her that a prophylaxis treatment is not recommended.  Advised her to notify the office if she develops body aches, fever over 100.4 and cough.  She will call back if needed.

## 2016-10-29 ENCOUNTER — Other Ambulatory Visit: Payer: Self-pay | Admitting: Internal Medicine

## 2016-10-29 DIAGNOSIS — Z1231 Encounter for screening mammogram for malignant neoplasm of breast: Secondary | ICD-10-CM

## 2016-11-06 ENCOUNTER — Other Ambulatory Visit: Payer: Self-pay | Admitting: Internal Medicine

## 2016-11-15 ENCOUNTER — Ambulatory Visit: Payer: Medicare Other

## 2016-12-02 ENCOUNTER — Ambulatory Visit: Payer: Medicare Other

## 2016-12-05 ENCOUNTER — Other Ambulatory Visit: Payer: Self-pay | Admitting: Internal Medicine

## 2016-12-14 ENCOUNTER — Other Ambulatory Visit: Payer: Self-pay | Admitting: Gastroenterology

## 2017-01-03 ENCOUNTER — Telehealth: Payer: Self-pay | Admitting: Emergency Medicine

## 2017-01-03 ENCOUNTER — Other Ambulatory Visit: Payer: Self-pay | Admitting: Emergency Medicine

## 2017-01-03 ENCOUNTER — Other Ambulatory Visit: Payer: Self-pay | Admitting: Internal Medicine

## 2017-01-03 MED ORDER — ATORVASTATIN CALCIUM 20 MG PO TABS: ORAL_TABLET | ORAL | 1 refills | Status: DC

## 2017-01-03 NOTE — Telephone Encounter (Signed)
Pt is out of lipitor

## 2017-01-03 NOTE — Telephone Encounter (Signed)
Denied.  Filled for 1 year on 06/15/16.  Message sent to the pharmacy to check file.

## 2017-01-03 NOTE — Telephone Encounter (Signed)
Spoke to the pharmacy and they state that they never received the electronic medication in 06/15/2016. Medication has been resent

## 2017-01-05 ENCOUNTER — Other Ambulatory Visit: Payer: Self-pay | Admitting: Internal Medicine

## 2017-01-10 ENCOUNTER — Other Ambulatory Visit: Payer: Self-pay

## 2017-01-10 NOTE — Telephone Encounter (Signed)
Pt request refill  traMADol (ULTRAM) 50 MG tablet  Pt states she is completely out due to shoulder pain and hopes she can get today.  Pt called pharmacy 5/02 and was not aware dr Regis Bill was out of the office.  Pt feels she cannot wait until wed when Dr Regis Bill returns.  [facility] [address]

## 2017-01-13 NOTE — Telephone Encounter (Signed)
Ok to refill maxalt x 1 and tramadol x 1

## 2017-01-21 ENCOUNTER — Ambulatory Visit: Payer: Medicare Other

## 2017-02-03 ENCOUNTER — Telehealth: Payer: Self-pay | Admitting: Internal Medicine

## 2017-02-03 NOTE — Telephone Encounter (Signed)
° ° °  Pt req a refill on the below med and is now using a different pharmacy  traMADol (ULTRAM) 50 MG tablet     [place]  [phone]

## 2017-02-04 ENCOUNTER — Other Ambulatory Visit: Payer: Self-pay | Admitting: Emergency Medicine

## 2017-02-04 MED ORDER — TRAMADOL HCL 50 MG PO TABS: 50.0000 mg | ORAL_TABLET | Freq: Four times a day (QID) | ORAL | 0 refills | Status: DC | PRN

## 2017-02-04 NOTE — Telephone Encounter (Signed)
Medication called in 

## 2017-02-04 NOTE — Telephone Encounter (Signed)
Last filed  On May 11  This is early for her. Can refill x 1     Due for ROV  Before runs out.

## 2017-02-04 NOTE — Telephone Encounter (Signed)
Please advise 

## 2017-02-22 ENCOUNTER — Ambulatory Visit
Admission: RE | Admit: 2017-02-22 | Discharge: 2017-02-22 | Disposition: A | Discharge status: Home / Self Care | Payer: Medicare Other | Source: Ambulatory Visit | Attending: Internal Medicine | Admitting: Internal Medicine

## 2017-02-22 DIAGNOSIS — Z1231 Encounter for screening mammogram for malignant neoplasm of breast: Secondary | ICD-10-CM

## 2017-03-22 ENCOUNTER — Telehealth: Payer: Self-pay | Admitting: Internal Medicine

## 2017-03-22 ENCOUNTER — Other Ambulatory Visit: Payer: Self-pay | Admitting: Emergency Medicine

## 2017-03-22 DIAGNOSIS — R519 Headache, unspecified: Secondary | ICD-10-CM

## 2017-03-22 DIAGNOSIS — R51 Headache: Principal | ICD-10-CM

## 2017-03-22 NOTE — Telephone Encounter (Signed)
Pt is calling to let you know that the [place] ([address] [place] Ext: [id number]) will be calling so that she can get a brace for her back and shoulder,knee and ankle.    Pt would also like to have a referral to get botox for her headaches.

## 2017-03-22 NOTE — Telephone Encounter (Signed)
Referral to headache specialist  Or neurology  (that  Give   botox  )  I don't prescribe braces    If she feels needed this should be   Prescribed  through specialists  Such as ortho sports med or rehab providers .

## 2017-03-22 NOTE — Telephone Encounter (Signed)
Please advise regarding botox for headaches?

## 2017-03-22 NOTE — Telephone Encounter (Signed)
Referral has been ordered and also told patient when we receive the calls in reference to the braces someone will contact her from the office.

## 2017-03-23 ENCOUNTER — Other Ambulatory Visit: Payer: Self-pay | Admitting: Emergency Medicine

## 2017-03-23 ENCOUNTER — Telehealth: Payer: Self-pay | Admitting: Emergency Medicine

## 2017-03-23 ENCOUNTER — Encounter: Payer: Self-pay | Admitting: Neurology

## 2017-03-23 MED ORDER — TRAMADOL HCL 50 MG PO TABS: 50.0000 mg | ORAL_TABLET | Freq: Four times a day (QID) | ORAL | 0 refills | Status: DC | PRN

## 2017-03-23 NOTE — Telephone Encounter (Signed)
Faxed in prescription.

## 2017-03-23 NOTE — Telephone Encounter (Signed)
Patient would like a refill for Tramadol 50 mg. Please advise.

## 2017-03-23 NOTE — Telephone Encounter (Signed)
Ok to refill x 1  

## 2017-03-24 NOTE — Telephone Encounter (Signed)
Spoke with patient and she states that the company [employer] contacted to her and tried to convince her that she needed these items. But I explained to her that doctor panosh does not fill out those forms and wouldn't recommend those items. Per. Dr. Regis Bill if patient is having pain any where back, ankle, etc she prefers patient see a specialist. Patients states that she is okay she does have a pinched nerve in her back But feels it will resolve itself. Patient does state she has some swelling I her ankles and would some advice.

## 2017-03-24 NOTE — Telephone Encounter (Signed)
Decrease sodium in diet   And exercise elevate legs when can  If ongoing  Progression  Or shortness of breath  then  Ov to reassess

## 2017-03-25 NOTE — Telephone Encounter (Signed)
Left a Vm for patient to give the office a call

## 2017-03-25 NOTE — Telephone Encounter (Signed)
Spoke to patient and gave Dr. Regis Bill recommendations patient understood and had no further questions.

## 2017-04-04 DIAGNOSIS — M5136 Other intervertebral disc degeneration, lumbar region: Secondary | ICD-10-CM | POA: Diagnosis not present

## 2017-04-04 DIAGNOSIS — M19071 Primary osteoarthritis, right ankle and foot: Secondary | ICD-10-CM | POA: Diagnosis not present

## 2017-04-04 DIAGNOSIS — M19072 Primary osteoarthritis, left ankle and foot: Secondary | ICD-10-CM | POA: Diagnosis not present

## 2017-04-04 DIAGNOSIS — M5002 Cervical disc disorder with myelopathy, mid-cervical region, unspecified level: Secondary | ICD-10-CM | POA: Diagnosis not present

## 2017-05-19 ENCOUNTER — Encounter: Payer: Self-pay | Admitting: Neurology

## 2017-05-19 ENCOUNTER — Ambulatory Visit (INDEPENDENT_AMBULATORY_CARE_PROVIDER_SITE_OTHER): Payer: Medicare Other | Admitting: Neurology

## 2017-05-19 VITALS — BP 118/90 | HR 84 | Ht 65.0 in | Wt 182.8 lb

## 2017-05-19 DIAGNOSIS — G5603 Carpal tunnel syndrome, bilateral upper limbs: Secondary | ICD-10-CM | POA: Diagnosis not present

## 2017-05-19 DIAGNOSIS — R519 Headache, unspecified: Secondary | ICD-10-CM

## 2017-05-19 DIAGNOSIS — G43809 Other migraine, not intractable, without status migrainosus: Secondary | ICD-10-CM | POA: Diagnosis not present

## 2017-05-19 DIAGNOSIS — M5030 Other cervical disc degeneration, unspecified cervical region: Secondary | ICD-10-CM

## 2017-05-19 DIAGNOSIS — R51 Headache: Secondary | ICD-10-CM | POA: Diagnosis not present

## 2017-05-19 NOTE — Progress Notes (Signed)
NEUROLOGY CONSULTATION NOTE  Michele Monroe MRN: [id number] DOB: 05/27/1946  Referring provider: Dr. Regis Bill Primary care provider: Dr. Regis Bill  Reason for consult:  headache  HISTORY OF PRESENT ILLNESS: Michele Monroe is a 71 year old right-handed female with hypertension, GERD, IBS, hyperlipidemia who presents for headache.    Onset:  71 years old Location:  Bilateral occipital region Quality:  throbbing Intensity:  Starts as 3/10 and progresses to 10/10 She has chronic neck pain that is worse with neck movement. Aura:  no Prodrome:  no Postdrome:  nausea Associated symptoms:  Sometimes nausea and vomiting.  Photophobia and phonophobia.  No visual disturbance..  She has not had any new worse headache of her life, waking up from sleep Duration:  Several days without treatment (30 minutes with Maxalt) Frequency:  2 to 3 days per week Frequency of abortive medication: 2 days per week Triggers/exacerbating factors:  Neck movement Relieving factors:  Maxalt Activity:  Aggravates.  Needs to lay down  Past NSAIDS:  Ibuprofen, Aleve, Excedrin Past analgesics:  Excedrin, Tylenol, tramadol Past abortive triptans:  no Past muscle relaxants:  Flexeril Past anti-emetic:  Yes but uncertain of type Past antihypertensive medications:  no Past antidepressant medications:  Unsure.  Not amitriptyline, nortriptyline or Effexor Past anticonvulsant medications:  topiramate (ineffective) Past vitamins/Herbal/Supplements:  no Other past therapies:  no  Current NSAIDS:  no Current analgesics:  tramadol 50mg  Current triptans:  Maxalt 10mg  Current anti-emetic:  no Current muscle relaxants:  no Current anti-anxiolytic:  no Current sleep aide:  no Current Antihypertensive medications:  atenolol 25mg  Current Antidepressant medications:  no Current Anticonvulsant medications:  no Current Vitamins/Herbal/Supplements:  MVI Current Antihistamines/Decongestants:  no Other therapy:  Neck massage  therapy (effective for about 3 days)  Caffeine:  Coke (for GERD) Alcohol:  no Smoker:  no Diet:  Drinks at least a gallon of water daily.  Coke most days of the week Exercise:  no Depression/anxiety:  Stable.  History of PTSD Sleep hygiene:  good Family history of headache:  Daughter has migraines  06/15/16 LABS:  CBC with WBC 8.9, HGB 13.8, HCT 40.7, PLT 242; BMP with Na 140, K 4.5, Cl 102, CO2 30, glucose 93, BUN 13 and Cr 0.90; hepatic panel with total bili 0.4, ALP 75, AST 17 and ALT 12.  Report of an old CT of the head from 06/24/05 (images not currently available for review) demonstrated "two areas of low density in the inferior left basal ganglia" felt to be more likely prominent perivascular spaces rather than old lacunar infarcts.  CT of cervical spine on the same day demonstrated multilevel degenerative changes.  She denies radicular pain down the arms but notes that she wakes up with bilateral hand numbness, which resolves when she shakes her hands out.  PAST MEDICAL HISTORY: Past Medical History:  Diagnosis Date  . ALLERGIC RHINITIS 06/30/2010   perennial  . Allergy    seasonal  . Coughing up blood 08/13/2012   piece of tissue? will send to path poss from upper airway sinusitis  Final path was "abscess"   . GERD 06/30/2010  . Headache(784.0) 06/30/2010   migraines  . Hepatic steatosis   . HYPERLIPIDEMIA 06/30/2010  . HYPERTENSION 06/30/2010  . IBS 06/30/2010  . Internal hemorrhoids   . Renal lesion   . Varicose veins   . VITAMIN D DEFICIENCY 06/30/2010    PAST SURGICAL HISTORY: Past Surgical History:  Procedure Laterality Date  . BREAST SURGERY     needle  bx left breast  . COLONOSCOPY    . feet     surgery  . ROTATOR CUFF REPAIR Right   . varicose veins      MEDICATIONS: Current Outpatient Prescriptions on File Prior to Visit  Medication Sig Dispense Refill  . atenolol (TENORMIN) 25 MG tablet TAKE 1 TABLET BY MOUTH EVERY DAY FOR BLOOD PRESSURE/MIGRAINE  90 tablet 1  . atorvastatin (LIPITOR) 20 MG tablet TAKE 1 TABLET (20 MG TOTAL) BY MOUTH DAILY. 90 tablet 1  . dicyclomine (BENTYL) 20 MG tablet Take 1 tablet (20 mg total) by mouth 2 (two) times daily. 180 tablet 3  . hydrocortisone 2.5 % cream PLACE 1 APPLICATION RECTALLY 2 (TWO) TIMES DAILY AS NEEDED FOR HEMORRHOIDS OR ITCHING. 28.35 g 0  . Multiple Vitamins-Minerals (MULTIVITAMIN ADULT PO) Take by mouth.    Marland Kitchen omeprazole (PRILOSEC OTC) 20 MG tablet Take 20 mg by mouth daily.    Marland Kitchen OVER THE COUNTER MEDICATION Reported on 12/01/2015    . rizatriptan (MAXALT) 10 MG tablet TAKE 1 TABLET BY MOUTH AS NEEDED FOR MIGRAINE, MAY REPEAT IN 2 HOURS IF NEEDED 36 tablet 0  . traMADol (ULTRAM) 50 MG tablet Take 1 tablet (50 mg total) by mouth every 6 (six) hours as needed. for pain 30 tablet 0   No current facility-administered medications on file prior to visit.     ALLERGIES: No Known Allergies  FAMILY HISTORY: Family History  Problem Relation Age of Onset  . Colon cancer Mother        age 5  . Breast cancer Mother   . Arthritis Brother   . Other Brother   . Breast cancer Cousin     SOCIAL HISTORY: Social History   Social History  . Marital status: Widowed    Spouse name: N/A  . Number of children: 2  . Years of education: N/A   Occupational History  . retired     previously was reseptionist   Social History Main Topics  . Smoking status: Former Smoker    Types: Cigarettes    Quit date: 09/06/1976  . Smokeless tobacco: Never Used  . Alcohol use No     Comment: rarely  . Drug use: No  . Sexual activity: Not on file   Other Topics Concern  . 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       Lives alone with her two small dogs in a one story home. 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     REVIEW OF SYSTEMS: Constitutional: No fevers, chills, or sweats, no generalized fatigue, change in appetite Eyes: No visual changes, double vision, eye pain Ear, nose and throat: No hearing loss, ear pain, nasal congestion, sore throat Cardiovascular: No chest pain, palpitations Respiratory:  No shortness of breath at rest or with exertion, wheezes GastrointestinaI: No nausea, vomiting, diarrhea, abdominal pain, fecal incontinence Genitourinary:  No dysuria, urinary retention or frequency Musculoskeletal:  No neck pain, back pain Integumentary: No rash, pruritus, skin lesions Neurological: as above Psychiatric: No depression, insomnia, anxiety Endocrine: No palpitations, fatigue, diaphoresis, mood swings, change in appetite, change in weight, increased thirst Hematologic/Lymphatic:  No purpura, petechiae. Allergic/Immunologic:  no itchy/runny eyes, nasal congestion, recent allergic reactions, rashes  PHYSICAL EXAM: Vitals:   05/19/17 0958  BP: 118/90  Pulse: 84  SpO2: 98%   General: No acute distress.  Patient appears well-groomed.  Head:  Normocephalic/atraumatic Eyes:  fundi examined but not visualized Neck: supple, mild left suboccipital and bilateral paraspinal tenderness, full range of motion Back: No paraspinal tenderness Heart: regular rate and rhythm Lungs: Clear to auscultation bilaterally. Vascular: No carotid bruits. Neurological Exam: Mental status: alert and oriented to person, place, and time, recent and remote memory intact, fund of knowledge intact, attention and concentration intact, speech fluent and not dysarthric, language intact. Cranial nerves: CN I: not tested CN II: pupils equal, round and reactive to light, visual fields intact CN III, IV, VI:  full range of motion, no nystagmus, no ptosis CN V: facial sensation intact CN VII: upper and lower face symmetric CN VIII: hearing intact CN IX,  X: gag intact, uvula midline CN XI: sternocleidomastoid and trapezius muscles intact CN XII: tongue midline Bulk & Tone: normal, no fasciculations. Motor:  5/5 throughout  Sensation: temperature and vibration sensation intact. Deep Tendon Reflexes:  2+ throughout, toes downgoing.  Finger to nose testing:  Without dysmetria.  Heel to shin:  Without dysmetria.  Gait:  Normal station and stride.  Able to turn and tandem walk. Romberg negative.  IMPRESSION: 1.  Cervicogenic migraines secondary to probable cervical degenerative disc disease and myofacial cervicalgia.  Headaches getting more frequent. 2.  Bilateral carpal tunnel syndrome  PLAN: 1.  We will first check MRI of cervical spine to evaluate for degree of arthritis and for possible stenosis 2.  Afterward, will likely refer for physical therapy 3.  Limit use of Maxalt to no more than 2 days out of the week to prevent rebound headache 4.  Lifestyle modification:  Caffeine cessation, exercise 5.  Follow up in 3 months.  Thank you for allowing me to take part in the care of this patient.  Metta Clines, DO  CC:  Shanon Ace, MD

## 2017-05-19 NOTE — Patient Instructions (Addendum)
I think the migraines are coming from the neck.  Therefore, I would like to focus on evaluating and treating the neck. 1.  We will first get MRI of cervical spine 2.  Then we will likely order physical therapy of the neck. 3.  Then numbness in the hands are likely carpal tunnel syndrome.  I recommend going to the pharmacy and getting wrist splints to wear at night. 4.  Limit use of Maxalt to no more than 2 days out of the week to prevent rebound headache 5.  Follow up in 3 months.

## 2017-05-27 ENCOUNTER — Other Ambulatory Visit: Payer: Self-pay | Admitting: Gastroenterology

## 2017-05-27 ENCOUNTER — Encounter: Payer: Self-pay | Admitting: Internal Medicine

## 2017-06-06 ENCOUNTER — Ambulatory Visit
Admission: RE | Admit: 2017-06-06 | Discharge: 2017-06-06 | Disposition: A | Discharge status: Home / Self Care | Payer: Medicare Other | Source: Ambulatory Visit | Attending: Neurology | Admitting: Neurology

## 2017-06-06 DIAGNOSIS — M5030 Other cervical disc degeneration, unspecified cervical region: Secondary | ICD-10-CM

## 2017-06-06 DIAGNOSIS — R519 Headache, unspecified: Secondary | ICD-10-CM

## 2017-06-06 DIAGNOSIS — R51 Headache: Secondary | ICD-10-CM

## 2017-06-06 DIAGNOSIS — G43809 Other migraine, not intractable, without status migrainosus: Secondary | ICD-10-CM

## 2017-06-06 DIAGNOSIS — M4802 Spinal stenosis, cervical region: Secondary | ICD-10-CM | POA: Diagnosis not present

## 2017-06-07 ENCOUNTER — Telehealth: Payer: Self-pay | Admitting: Neurology

## 2017-06-07 NOTE — Telephone Encounter (Signed)
-----   Message from Pieter Partridge, DO sent at 06/07/2017 12:47 PM EDT ----- MRI of cervical spine shows significant degenerative changes/arthritis which may be contributing to neck pain and headache.  We can continue current treatment.  I would hold off on physical therapy.  If she is agreeable, we can refer her to a pain specialist to see if any injections in the neck may be helpful.

## 2017-06-07 NOTE — Telephone Encounter (Signed)
Mychart message sent to patient.

## 2017-06-13 ENCOUNTER — Encounter: Payer: Self-pay | Admitting: Internal Medicine

## 2017-06-13 ENCOUNTER — Ambulatory Visit (INDEPENDENT_AMBULATORY_CARE_PROVIDER_SITE_OTHER): Payer: Medicare Other | Admitting: Internal Medicine

## 2017-06-13 VITALS — BP 122/72 | HR 74 | Temp 98.4°F | Resp 16 | Ht 65.0 in | Wt 182.8 lb

## 2017-06-13 DIAGNOSIS — R51 Headache: Secondary | ICD-10-CM | POA: Diagnosis not present

## 2017-06-13 DIAGNOSIS — J069 Acute upper respiratory infection, unspecified: Secondary | ICD-10-CM | POA: Diagnosis not present

## 2017-06-13 DIAGNOSIS — R519 Headache, unspecified: Secondary | ICD-10-CM

## 2017-06-13 DIAGNOSIS — M47812 Spondylosis without myelopathy or radiculopathy, cervical region: Secondary | ICD-10-CM

## 2017-06-13 NOTE — Patient Instructions (Signed)
This acts like a viral espiratory infection .   involving your sinuses  But should resolve on its own   In the next 1-2 week.   Can use saline and flonase.   Rest   Etc.    If not contacted call Dr Loretta Plume  s office about the  Neck mri and   Any plan for referrals.  Or plan

## 2017-06-13 NOTE — Telephone Encounter (Signed)
Spoke to Pt, she asked I send MRI results to Dr Sherilyn Cooter at [facility] and Dr. Delman [facility] at [facility]

## 2017-06-13 NOTE — Progress Notes (Signed)
Chief Complaint  Patient presents with  . Cough    started yesterday, running nose, post nasal drip, headaches    HPI: Michele Monroe 71 y.o.   oncet cough and headaches.    36-48 hours of sx achy runny nose fact pressure  And minor cough  No meds for this  Has hx of sinusitis Ongoing headaches  hasnt heart results of mri yet.   Mri October 1 .     Has fu  appt in December.  Ask about results  ROS: See pertinent positives and negatives per HPI.  Past Medical History:  Diagnosis Date  . ALLERGIC RHINITIS 06/30/2010   perennial  . Allergy    seasonal  . Coughing up blood 08/13/2012   piece of tissue? will send to path poss from upper airway sinusitis  Final path was "abscess"   . GERD 06/30/2010  . Headache(784.0) 06/30/2010   migraines  . Hepatic steatosis   . HYPERLIPIDEMIA 06/30/2010  . HYPERTENSION 06/30/2010  . IBS 06/30/2010  . Internal hemorrhoids   . Renal lesion   . Varicose veins   . VITAMIN D DEFICIENCY 06/30/2010    Family History  Problem Relation Age of Onset  . Colon cancer Mother        age 73  . Breast cancer Mother   . Arthritis Brother   . Other Brother   . Breast cancer Cousin     Social History   Social History  . Marital status: Widowed    Spouse name: N/A  . Number of children: 2  . Years of education: N/A   Occupational History  . retired     previously was reseptionist   Social History Main Topics  . Smoking status: Former Smoker    Types: Cigarettes    Quit date: 09/06/1976  . Smokeless tobacco: Never Used  . Alcohol use No     Comment: rarely  . Drug use: No  . Sexual activity: Not Asked   Other Topics Concern  . 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       Lives alone with her two small dogs in a one story home. 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     Outpatient Medications Prior to Visit  Medication Sig Dispense Refill  . atenolol (TENORMIN) 25 MG tablet TAKE 1 TABLET BY MOUTH EVERY DAY FOR BLOOD PRESSURE/MIGRAINE 90 tablet 1  . atorvastatin (LIPITOR) 20 MG tablet TAKE 1 TABLET (20 MG TOTAL) BY MOUTH DAILY. 90 tablet 1  . dicyclomine (BENTYL) 20 MG tablet TAKE ONE TABLET BY MOUTH TWICE DAILY 60 tablet 11  . hydrocortisone 2.5 % cream PLACE 1 APPLICATION RECTALLY 2 (TWO) TIMES DAILY AS NEEDED FOR HEMORRHOIDS OR ITCHING. 28.35 g 0  . Multiple Vitamins-Minerals (MULTIVITAMIN ADULT PO) Take by mouth.    Marland Kitchen omeprazole (PRILOSEC OTC) 20 MG tablet Take 20 mg by mouth daily.    Marland Kitchen OVER THE COUNTER MEDICATION Reported on 12/01/2015    . rizatriptan (MAXALT) 10 MG tablet TAKE 1 TABLET BY MOUTH AS NEEDED FOR MIGRAINE, MAY REPEAT IN 2 HOURS IF NEEDED 36 tablet 0  . traMADol (ULTRAM) 50 MG tablet  Take 1 tablet (50 mg total) by mouth every 6 (six) hours as needed. for pain 30 tablet 0   No facility-administered medications prior to visit.      EXAM:  BP 122/72   Pulse 74   Temp 98.4 F (36.9 C) (Oral)   Resp 16   Ht 5\' 5"  (1.651 m)   Wt 182 lb 12.8 oz (82.9 kg)   SpO2 98%   BMI 30.42 kg/m   Body mass index is 30.42 kg/m. WDWN in NAD  quiet respirations; mildly congested  somewhat hoarse. Non toxic . HEENT: Normocephalic ;atraumatic , Eyes;  PERRL, EOMs  Full, lids and conjunctiva clear,,Ears: no deformities, canals nl, TM landmarks normal, Nose: no deformity or discharge but congested;face minimally tender Mouth : OP clear without lesion or edema . Mild cresent  Redness  Good airway Neck: Supple without adenopathy or masses or bruits Chest:  Clear to A without wheezes rales or rhonchi CV:  S1-S2 no gallops or murmurs peripheral perfusion is normal Skin :nl perfusion and no acute rashes  ASSESSMENT AND PLAN:  Discussed the following assessment and plan:  Upper respiratory  infection, viral  Osteoarthritis of cervical spine, unspecified spinal osteoarthritis complication status  Nonintractable headache, unspecified chronicity pattern, unspecified headache type - poss cervicogenic  call neurology if  not contacted this week but reveied  mri results pt asked   Expectant management.  -Patient advised to return or notify health care team  if symptoms worsen ,persist or new concerns arise.  Patient Instructions  This acts like a viral espiratory infection .   involving your sinuses  But should resolve on its own   In the next 1-2 week.   Can use saline and flonase.   Rest   Etc.    If not contacted call Dr Loretta Plume  s office about the  Neck mri and   Any plan for referrals.  Or plan           Michele Monroe. Michele Monroe M.D.

## 2017-06-21 ENCOUNTER — Other Ambulatory Visit: Payer: Self-pay | Admitting: Internal Medicine

## 2017-06-22 ENCOUNTER — Telehealth: Payer: Self-pay | Admitting: Emergency Medicine

## 2017-06-22 NOTE — Telephone Encounter (Signed)
Patient is due for Preventive Health Visit in Dec. Sent in a 90 day supply for Lipitor for patient. Please help patient make an appointment. Thank you.

## 2017-06-23 ENCOUNTER — Other Ambulatory Visit: Payer: Self-pay | Admitting: Internal Medicine

## 2017-06-23 NOTE — Telephone Encounter (Signed)
lmom for pt to call back

## 2017-06-23 NOTE — Telephone Encounter (Signed)
Pt scheduled  

## 2017-06-30 ENCOUNTER — Ambulatory Visit: Payer: Medicare Other | Admitting: Neurology

## 2017-07-06 ENCOUNTER — Other Ambulatory Visit: Payer: Self-pay | Admitting: Internal Medicine

## 2017-07-11 NOTE — Telephone Encounter (Signed)
Okay to refill or does this need to go to Dr. Tomi Likens?

## 2017-07-12 ENCOUNTER — Encounter: Payer: Self-pay | Admitting: Internal Medicine

## 2017-07-12 NOTE — Telephone Encounter (Signed)
Please asked Dr. Tomi Likens if he will take over the refills of the Maxalt for her headaches.  Thank you

## 2017-07-13 ENCOUNTER — Other Ambulatory Visit: Payer: Self-pay

## 2017-07-13 MED ORDER — RIZATRIPTAN BENZOATE 10 MG PO TBDP
10.0000 mg | ORAL_TABLET | ORAL | 3 refills | Status: DC | PRN
Start: 2017-07-13 — End: 2017-07-13

## 2017-07-13 MED ORDER — RIZATRIPTAN BENZOATE 10 MG PO TABS: 10.0000 mg | ORAL_TABLET | ORAL | 3 refills | Status: DC | PRN

## 2017-07-13 NOTE — Telephone Encounter (Signed)
Michele Monroe, Please refill the Maxalt

## 2017-08-01 DIAGNOSIS — H2513 Age-related nuclear cataract, bilateral: Secondary | ICD-10-CM | POA: Diagnosis not present

## 2017-08-04 ENCOUNTER — Other Ambulatory Visit: Payer: Self-pay | Admitting: Internal Medicine

## 2017-08-04 ENCOUNTER — Other Ambulatory Visit: Payer: Self-pay

## 2017-08-05 NOTE — Progress Notes (Signed)
Chief Complaint  Patient presents with  . Follow-up    MRI done - arthritis. Labs today.     HPI: Michele Monroe 71 y.o. comes in today for Preventive Medicare exam/ wellness visit .Since last visit.under eval for neck pain and has.    Uses ocass tramadol  Last rx has lasted since summer getting massages     Takes care of grand kids in days and doing well  Lipids  Taking meds   Ht  No problem with  bp readings     Health Maintenance  Topic Date Due  . Hepatitis C Screening  November 07, 1945  . TETANUS/TDAP  09/06/2017  . MAMMOGRAM  02/23/2019  . COLONOSCOPY  07/09/2020  . INFLUENZA VACCINE  Completed  . DEXA SCAN  Completed  . PNA vac Low Risk Adult  Completed   Health Maintenance Review LIFESTYLE:  Exercise:     Grand kids    Sitting . Walk dogs  And  Tobacco/ETS: no Alcohol:  no Sugar beverages: Sleep: 8-9  Drug use: no HH:1 2 dogs  care taking grand kids many evenings per week.  Using nutrition shakes  And healthy eating    Hearing: ok   Vision:  No limitations at present . Last eye check UTD  Safety:  Has smoke detector and wears seat belts.  No firearms. No excess sun exposure. Sees dentist regularly.  Falls: n  Advance directive :  Reviewed  .  Memory: Felt to be good  , no concern from her or her family.  Depression: No anhedonia unusual crying or depressive symptoms  Nutrition: Eats well balanced diet; adequate calcium and vitamin D. No swallowing chewing problems.  Injury: no major injuries in the last six months.  Other healthcare providers:  Reviewed today .  Preventive parameters: up-to-date  Reviewed   ADLS:   There are no problems or need for assistance  driving, feeding, obtaining food, dressing, toileting and bathing, managing money using phone. She is independent.  EXERCISE/ HABITS  Per week   No tobacco    etoh   ROS:  GEN/ HEENT: No fever, significant weight changes sweatsvision problems hearing changes, CV/ PULM; No chest pain  shortness of breath cough, syncope,edema  change in exercise tolerance. GI /GU: No adominal pain, vomiting, change in bowel habits. No blood in the stool. No significant GU symptoms. SKIN/HEME: ,no acute skin rashes suspicious lesions or bleeding. No lymphadenopathy, nodules, masses.  NEURO/ PSYCH:  No neurologic signs such as weakness numbness. No depression anxiety. IMM/ Allergy: No unusual infections.  Allergy .   REST of 12 system review negative except as per HPI   Past Medical History:  Diagnosis Date  . ALLERGIC RHINITIS 06/30/2010   perennial  . Allergy    seasonal  . Coughing up blood 08/13/2012   piece of tissue? will send to path poss from upper airway sinusitis  Final path was "abscess"   . GERD 06/30/2010  . Headache(784.0) 06/30/2010   migraines  . Hepatic steatosis   . HYPERLIPIDEMIA 06/30/2010  . HYPERTENSION 06/30/2010  . IBS 06/30/2010  . Internal hemorrhoids   . Renal lesion   . Varicose veins   . VITAMIN D DEFICIENCY 06/30/2010    Family History  Problem Relation Age of Onset  . Colon cancer Mother        age 75  . Breast cancer Mother   . Arthritis Brother   . Other Brother   . Breast cancer Cousin  Social History   Socioeconomic History  . Marital status: Widowed    Spouse name: None  . Number of children: 2  . Years of education: None  . Highest education level: None  Social Needs  . Financial resource strain: None  . Food insecurity - worry: None  . Food insecurity - inability: None  . Transportation needs - medical: None  . Transportation needs - non-medical: None  Occupational History  . 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 Smoking status: Former Smoker    Types: Cigarettes    Last attempt to quit: 09/06/1976    Years since quitting: 40.9  . Smokeless tobacco: Never Used  Substance and Sexual Activity  . Alcohol use: No    Alcohol/week: 0.0 oz    Comment: rarely  . Drug use: No  .  Sexual activity: None  Other Topics Concern  . 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       Lives alone with her two small dogs in a one story home. 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     Outpatient Encounter Medications as of 08/08/2017  Medication Sig  . atenolol (TENORMIN) 25 MG tablet TAKE 1 TABLET BY MOUTH EVERY DAY FOR BLOOD PRESSURE/MIGRAINE  . atorvastatin (LIPITOR) 20 MG tablet TAKE ONE TABLET BY MOUTH EVERY DAY  . dicyclomine (BENTYL) 20 MG tablet TAKE ONE TABLET BY MOUTH TWICE DAILY  . Multiple Vitamins-Minerals (MULTIVITAMIN ADULT PO) Take by mouth.  Marland Kitchen omeprazole (PRILOSEC OTC) 20 MG tablet Take 20 mg by mouth daily.  Marland Kitchen OVER THE COUNTER MEDICATION Reported on 12/01/2015  . rizatriptan (MAXALT) 10 MG tablet TAKE ONE TABLET BY MOUTH AT ONSET ON MIGRAINE, MAY REPEAT IN 2 HOURS  . traMADol (ULTRAM) 50 MG tablet Take 1 tablet (50 mg total) by mouth every 6 (six) hours as needed. for pain  . hydrocortisone 2.5 % cream PLACE 1 APPLICATION RECTALLY 2 (TWO) TIMES DAILY AS NEEDED FOR HEMORRHOIDS OR ITCHING. (Patient not taking: Reported on 08/08/2017)  . [DISCONTINUED] atenolol (TENORMIN) 25 MG tablet TAKE 1 TABLET BY MOUTH EVERY DAY FOR BLOOD PRESSURE/MIGRAINE (Patient not taking: Reported on 08/08/2017)  . [DISCONTINUED] atenolol (TENORMIN) 25 MG tablet TAKE ONE TABLET BY MOUTH EVERY DAY (Patient not taking: Reported on 08/08/2017)   No facility-administered encounter medications on file as of 08/08/2017.     EXAM:  BP 122/78 (BP Location: Right Arm, Patient Position: Sitting, Cuff Size: Normal)   Pulse 74   Temp 97.8 F (36.6 C) (Oral)   Ht 5' 5.5" (1.664 m)   Wt 182 lb (82.6 kg)   BMI 29.83 kg/m   Body mass index is 29.83 kg/m.  Physical  Exam: Vital signs reviewed OEU:MPNT is a well-developed well-nourished alert cooperative   who appears stated age in no acute distress.  HEENT: normocephalic atraumatic , Eyes: PERRL EOM's full, conjunctiva clear, Nares: paten,t no deformity discharge or tenderness., Ears: no deformity EAC's clear TMs with normal landmarks. Mouth: clear OP, no lesions, edema.  Moist mucous membranes. Dentition in adequate repair. NECK:  without masses, thyromegaly or bruits. CHEST/PULM:  Clear to auscultation and percussion breath sounds equal  no wheeze , rales or rhonchi. No chest wall deformities or tenderness. CV: PMI is nondisplaced, S1 S2 no gallops, murmurs, rubs. Peripheral pulses are full without delay. .  ABDOMEN: Bowel sounds normal nontender  No guard or rebound, no hepato splenomegal no CVA tenderness.   Extremtities:  No clubbing cyanosis or edema, no acute joint swelling or redness no focal atrophy  Vv no ulceration or edema NEURO:  Oriented x3, cranial nerves 3-12 appear to be intact, no obvious focal weakness,gait within normal limits no abnormal reflexes or asymmetrical SKIN: No acute rashes normal turgor, color, no bruising or petechiae. PSYCH: Oriented, good eye contact, no obvious depression anxiety, cognition and judgment appear normal. LN: no cervical axillary il adenopathy No noted deficits in memory, attention, and speech.   Lab Results  Component Value Date   WBC 8.9 06/15/2016   HGB 13.8 06/15/2016   HCT 40.7 06/15/2016   PLT 242.0 06/15/2016   GLUCOSE 93 06/15/2016   CHOL 170 06/15/2016   TRIG 168.0 (H) 06/15/2016   HDL 52.20 06/15/2016   LDLCALC 84 06/15/2016   ALT 12 06/15/2016   AST 17 06/15/2016   NA 140 06/15/2016   K 4.5 06/15/2016   CL 102 06/15/2016   CREATININE 0.90 06/15/2016   BUN 13 06/15/2016   CO2 30 06/15/2016   TSH 1.33 06/15/2016    ASSESSMENT AND PLAN:  Discussed the following assessment and plan:  Visit for preventive health examination - Plan:  Basic metabolic panel, CBC with Differential/Platelet, Lipid panel, Hepatic function panel  Medication management - Plan: Basic metabolic panel, CBC with Differential/Platelet, Lipid panel, Hepatic function panel  Osteoarthritis of cervical spine, unspecified spinal osteoarthritis complication status - Plan: Basic metabolic panel, CBC with Differential/Platelet, Lipid panel, Hepatic function panel  Essential hypertension - Plan: Basic metabolic panel, CBC with Differential/Platelet, Lipid panel, Hepatic function panel  Hyperlipidemia, unspecified hyperlipidemia type - Plan: Basic metabolic panel, CBC with Differential/Platelet, Lipid panel, Hepatic function panel  Encounter for hepatitis C virus screening test  - risk bu birth cohort - Plan: Hepatitis Acute Panel Lab nf today protein shake at mid day and crem sugar in coffee this am   Patient Care Team: Burnis Medin, MD as PCP - General Olevia Perches Lowella Bandy, MD (Inactive) as Attending Physician (Gastroenterology) Pieter Partridge, DO as Consulting Physician (Neurology)  Patient Instructions   Will notify you  of labs when available. Keep up the healthy eating    Shingles vaccine when available .  BP Readings from Last 3 Encounters:  08/08/17 122/78  06/13/17 122/72  05/19/17 118/90   Wt Readings from Last 3 Encounters:  08/08/17 182 lb (82.6 kg)  06/13/17 182 lb 12.8 oz (82.9 kg)  05/19/17 182 lb 12.8 oz (82.9 kg)        Preventive Care 65 Years and Older, Female Preventive care refers to lifestyle choices and visits with your health care provider that can promote health and wellness. What does preventive care include?  A yearly physical exam. This is also called an annual well check.  Dental exams once or twice a year.  Routine eye exams. Ask your health care provider how often you should have your eyes checked.  Personal lifestyle choices, including: ? Daily care of your teeth and gums. ? Regular physical  activity. ? Eating a healthy diet. ? Avoiding tobacco and drug use. ? Limiting alcohol use. ? Practicing safe sex. ? Taking low-dose aspirin every day. ? Taking vitamin and mineral supplements as recommended  by your health care provider. What happens during an annual well check? The services and screenings done by your health care provider during your annual well check will depend on your age, overall health, lifestyle risk factors, and family history of disease. Counseling Your health care provider may ask you questions about your:  Alcohol use.  Tobacco use.  Drug use.  Emotional well-being.  Home and relationship well-being.  Sexual activity.  Eating habits.  History of falls.  Memory and ability to understand (cognition).  Work and work Statistician.  Reproductive health.  Screening You may have the following tests or measurements:  Height, weight, and BMI.  Blood pressure.  Lipid and cholesterol levels. These may be checked every 5 years, or more frequently if you are over 62 years old.  Skin check.  Lung cancer screening. You may have this screening every year starting at age 93 if you have a 30-pack-year history of smoking and currently smoke or have quit within the past 15 years.  Fecal occult blood test (FOBT) of the stool. You may have this test every year starting at age 93.  Flexible sigmoidoscopy or colonoscopy. You may have a sigmoidoscopy every 5 years or a colonoscopy every 10 years starting at age 14.  Hepatitis C blood test.  Hepatitis B blood test.  Sexually transmitted disease (STD) testing.  Diabetes screening. This is done by checking your blood sugar (glucose) after you have not eaten for a while (fasting). You may have this done every 1-3 years.  Bone density scan. This is done to screen for osteoporosis. You may have this done starting at age 40.  Mammogram. This may be done every 1-2 years. Talk to your health care provider about  how often you should have regular mammograms.  Talk with your health care provider about your test results, treatment options, and if necessary, the need for more tests. Vaccines Your health care provider may recommend certain vaccines, such as:  Influenza vaccine. This is recommended every year.  Tetanus, diphtheria, and acellular pertussis (Tdap, Td) vaccine. You may need a Td booster every 10 years.  Varicella vaccine. You may need this if you have not been vaccinated.  Zoster vaccine. You may need this after age 28.  Measles, mumps, and rubella (MMR) vaccine. You may need at least one dose of MMR if you were born in 1957 or later. You may also need a second dose.  Pneumococcal 13-valent conjugate (PCV13) vaccine. One dose is recommended after age 37.  Pneumococcal polysaccharide (PPSV23) vaccine. One dose is recommended after age 51.  Meningococcal vaccine. You may need this if you have certain conditions.  Hepatitis A vaccine. You may need this if you have certain conditions or if you travel or work in places where you may be exposed to hepatitis A.  Hepatitis B vaccine. You may need this if you have certain conditions or if you travel or work in places where you may be exposed to hepatitis B.  Haemophilus influenzae type b (Hib) vaccine. You may need this if you have certain conditions.  Talk to your health care provider about which screenings and vaccines you need and how often you need them. This information is not intended to replace advice given to you by your health care provider. Make sure you discuss any questions you have with your health care provider. Document Released: 09/19/2015 Document Revised: 05/12/2016 Document Reviewed: 06/24/2015 Elsevier Interactive Patient Education  2017 [place] K. Nuala Chiles M.D.

## 2017-08-08 ENCOUNTER — Ambulatory Visit (INDEPENDENT_AMBULATORY_CARE_PROVIDER_SITE_OTHER): Payer: Medicare Other | Admitting: Internal Medicine

## 2017-08-08 ENCOUNTER — Encounter: Payer: Self-pay | Admitting: Internal Medicine

## 2017-08-08 VITALS — BP 122/78 | HR 74 | Temp 97.8°F | Ht 65.5 in | Wt 182.0 lb

## 2017-08-08 DIAGNOSIS — Z9189 Other specified personal risk factors, not elsewhere classified: Secondary | ICD-10-CM

## 2017-08-08 DIAGNOSIS — I10 Essential (primary) hypertension: Secondary | ICD-10-CM

## 2017-08-08 DIAGNOSIS — Z79899 Other long term (current) drug therapy: Secondary | ICD-10-CM

## 2017-08-08 DIAGNOSIS — E785 Hyperlipidemia, unspecified: Secondary | ICD-10-CM

## 2017-08-08 DIAGNOSIS — Z0000 Encounter for general adult medical examination without abnormal findings: Secondary | ICD-10-CM | POA: Diagnosis not present

## 2017-08-08 DIAGNOSIS — Z1159 Encounter for screening for other viral diseases: Secondary | ICD-10-CM

## 2017-08-08 DIAGNOSIS — M47812 Spondylosis without myelopathy or radiculopathy, cervical region: Secondary | ICD-10-CM | POA: Diagnosis not present

## 2017-08-08 NOTE — Patient Instructions (Addendum)
Will notify you  of labs when available. Keep up the healthy eating    Shingles vaccine when available .  BP Readings from Last 3 Encounters:  08/08/17 122/78  06/13/17 122/72  05/19/17 118/90   Wt Readings from Last 3 Encounters:  08/08/17 182 lb (82.6 kg)  06/13/17 182 lb 12.8 oz (82.9 kg)  05/19/17 182 lb 12.8 oz (82.9 kg)        Preventive Care 65 Years and Older, Female Preventive care refers to lifestyle choices and visits with your health care provider that can promote health and wellness. What does preventive care include?  A yearly physical exam. This is also called an annual well check.  Dental exams once or twice a year.  Routine eye exams. Ask your health care provider how often you should have your eyes checked.  Personal lifestyle choices, including: ? Daily care of your teeth and gums. ? Regular physical activity. ? Eating a healthy diet. ? Avoiding tobacco and drug use. ? Limiting alcohol use. ? Practicing safe sex. ? Taking low-dose aspirin every day. ? Taking vitamin and mineral supplements as recommended by your health care provider. What happens during an annual well check? The services and screenings done by your health care provider during your annual well check will depend on your age, overall health, lifestyle risk factors, and family history of disease. Counseling Your health care provider may ask you questions about your:  Alcohol use.  Tobacco use.  Drug use.  Emotional well-being.  Home and relationship well-being.  Sexual activity.  Eating habits.  History of falls.  Memory and ability to understand (cognition).  Work and work Statistician.  Reproductive health.  Screening You may have the following tests or measurements:  Height, weight, and BMI.  Blood pressure.  Lipid and cholesterol levels. These may be checked every 5 years, or more frequently if you are over 91 years old.  Skin check.  Lung cancer  screening. You may have this screening every year starting at age 38 if you have a 30-pack-year history of smoking and currently smoke or have quit within the past 15 years.  Fecal occult blood test (FOBT) of the stool. You may have this test every year starting at age 46.  Flexible sigmoidoscopy or colonoscopy. You may have a sigmoidoscopy every 5 years or a colonoscopy every 10 years starting at age 12.  Hepatitis C blood test.  Hepatitis B blood test.  Sexually transmitted disease (STD) testing.  Diabetes screening. This is done by checking your blood sugar (glucose) after you have not eaten for a while (fasting). You may have this done every 1-3 years.  Bone density scan. This is done to screen for osteoporosis. You may have this done starting at age 1.  Mammogram. This may be done every 1-2 years. Talk to your health care provider about how often you should have regular mammograms.  Talk with your health care provider about your test results, treatment options, and if necessary, the need for more tests. Vaccines Your health care provider may recommend certain vaccines, such as:  Influenza vaccine. This is recommended every year.  Tetanus, diphtheria, and acellular pertussis (Tdap, Td) vaccine. You may need a Td booster every 10 years.  Varicella vaccine. You may need this if you have not been vaccinated.  Zoster vaccine. You may need this after age 83.  Measles, mumps, and rubella (MMR) vaccine. You may need at least one dose of MMR if you were born in 1957  or later. You may also need a second dose.  Pneumococcal 13-valent conjugate (PCV13) vaccine. One dose is recommended after age 49.  Pneumococcal polysaccharide (PPSV23) vaccine. One dose is recommended after age 23.  Meningococcal vaccine. You may need this if you have certain conditions.  Hepatitis A vaccine. You may need this if you have certain conditions or if you travel or work in places where you may be exposed  to hepatitis A.  Hepatitis B vaccine. You may need this if you have certain conditions or if you travel or work in places where you may be exposed to hepatitis B.  Haemophilus influenzae type b (Hib) vaccine. You may need this if you have certain conditions.  Talk to your health care provider about which screenings and vaccines you need and how often you need them. This information is not intended to replace advice given to you by your health care provider. Make sure you discuss any questions you have with your health care provider. Document Released: 09/19/2015 Document Revised: 05/12/2016 Document Reviewed: 06/24/2015 Elsevier Interactive Patient Education  2017 [employer].

## 2017-08-09 ENCOUNTER — Telehealth: Payer: Self-pay | Admitting: Internal Medicine

## 2017-08-09 LAB — CBC WITH DIFFERENTIAL/PLATELET
BASOS ABS: 0.1 10*3/uL (ref 0.0–0.1)
BASOS PCT: 0.6 % (ref 0.0–3.0)
EOS ABS: 0.3 10*3/uL (ref 0.0–0.7)
Eosinophils Relative: 3.4 % (ref 0.0–5.0)
HCT: 40.1 % (ref 36.0–46.0)
HEMOGLOBIN: 13.1 g/dL (ref 12.0–15.0)
Lymphocytes Relative: 31.7 % (ref 12.0–46.0)
Lymphs Abs: 2.8 10*3/uL (ref 0.7–4.0)
MCHC: 32.7 g/dL (ref 30.0–36.0)
MCV: 86.6 fl (ref 78.0–100.0)
MONO ABS: 0.6 10*3/uL (ref 0.1–1.0)
Monocytes Relative: 6.4 % (ref 3.0–12.0)
NEUTROS ABS: 5.2 10*3/uL (ref 1.4–7.7)
NEUTROS PCT: 57.9 % (ref 43.0–77.0)
Platelets: 253 10*3/uL (ref 150.0–400.0)
RBC: 4.63 Mil/uL (ref 3.87–5.11)
RDW: 13.2 % (ref 11.5–15.5)
WBC: 9 10*3/uL (ref 4.0–10.5)

## 2017-08-09 LAB — BASIC METABOLIC PANEL
BUN: 15 mg/dL (ref 6–23)
CALCIUM: 9.8 mg/dL (ref 8.4–10.5)
CHLORIDE: 102 meq/L (ref 96–112)
CO2: 28 mEq/L (ref 19–32)
CREATININE: 0.88 mg/dL (ref 0.40–1.20)
GFR: 67.49 mL/min (ref >60.00)
Glucose, Bld: 91 mg/dL (ref 70–99)
Potassium: 3.9 mEq/L (ref 3.5–5.1)
Sodium: 138 mEq/L (ref 135–145)

## 2017-08-09 LAB — HEPATIC FUNCTION PANEL
ALT: 14 U/L (ref 0–35)
AST: 19 U/L (ref 0–37)
Albumin: 4.3 g/dL (ref 3.5–5.2)
Alkaline Phosphatase: 59 U/L (ref 39–117)
BILIRUBIN TOTAL: 0.5 mg/dL (ref 0.2–1.2)
Bilirubin, Direct: 0.1 mg/dL (ref 0.0–0.3)
TOTAL PROTEIN: 7.1 g/dL (ref 6.0–8.3)

## 2017-08-09 LAB — LIPID PANEL
CHOLESTEROL: 176 mg/dL (ref 0–200)
HDL: 58.4 mg/dL (ref >39.00)
LDL CALC: 94 mg/dL (ref 0–99)
NonHDL: 117.17
TRIGLYCERIDES: 115 mg/dL (ref 0.0–149.0)
Total CHOL/HDL Ratio: 3
VLDL: 23 mg/dL (ref 0.0–40.0)

## 2017-08-09 LAB — HEPATITIS PANEL, ACUTE
HEP A IGM: NONREACTIVE
HEP B S AG: NONREACTIVE
Hep B C IgM: NONREACTIVE
Hepatitis C Ab: NONREACTIVE
SIGNAL TO CUT-OFF: 0.04 (ref <1.00)

## 2017-08-09 NOTE — Telephone Encounter (Signed)
Pt wanting to know if there is a blood test that she can take to see if she needs to have the Shingrix vaccine -- like a titer.  Pt states that her [facility] Provider advised to her to check with her PCP about this prior to having the Shringrix vaccine.  Pt has to pay a $45 co-pay for the vaccine. Please advise Dr Regis Bill, thanks.

## 2017-08-09 NOTE — Telephone Encounter (Signed)
Curious request    We dont usually do titers for this problem  What titers  ? Varicella?   Zoster and not sure what  Difference that would make .   In getting the vaccine   If born before  [place] have been  Exposed to natural disease.

## 2017-08-11 NOTE — Telephone Encounter (Signed)
Pt aware of rec's per Dr Regis Bill. States that she may contact her insurance and get more information.  Pt states she may go ahead and get the vaccine at the pharmacy.  Will send to Dr Regis Bill as [name]

## 2017-08-19 ENCOUNTER — Ambulatory Visit: Payer: Medicare Other | Admitting: Neurology

## 2017-08-22 ENCOUNTER — Other Ambulatory Visit: Payer: Self-pay | Admitting: Internal Medicine

## 2017-08-22 NOTE — Telephone Encounter (Signed)
Last filled 03/23/17 #30, x0 RF Seen 08/08/17  Please advise Dr Sarajane Jews if able to authorize a refill in Dr Velora Mediate absence, thanks.

## 2017-08-23 NOTE — Telephone Encounter (Signed)
Call in #30 with no rf  

## 2017-08-24 MED ORDER — TRAMADOL HCL 50 MG PO TABS: 50.0000 mg | ORAL_TABLET | Freq: Four times a day (QID) | ORAL | 0 refills | Status: DC | PRN

## 2017-08-24 NOTE — Telephone Encounter (Signed)
Refill phoned into pharmacy.  Nothing further needed.

## 2017-08-31 ENCOUNTER — Other Ambulatory Visit: Payer: Self-pay | Admitting: Emergency Medicine

## 2017-08-31 MED ORDER — ATORVASTATIN CALCIUM 20 MG PO TABS: 20.0000 mg | ORAL_TABLET | Freq: Every day | ORAL | 1 refills | Status: DC

## 2017-08-31 MED ORDER — ATENOLOL 25 MG PO TABS: ORAL_TABLET | ORAL | 1 refills | Status: DC

## 2017-10-12 DIAGNOSIS — S63502A Unspecified sprain of left wrist, initial encounter: Secondary | ICD-10-CM | POA: Diagnosis not present

## 2017-10-12 DIAGNOSIS — M25532 Pain in left wrist: Secondary | ICD-10-CM | POA: Diagnosis not present

## 2017-10-12 DIAGNOSIS — S63602A Unspecified sprain of left thumb, initial encounter: Secondary | ICD-10-CM | POA: Diagnosis not present

## 2017-11-01 ENCOUNTER — Other Ambulatory Visit: Payer: Self-pay | Admitting: Internal Medicine

## 2017-12-01 ENCOUNTER — Other Ambulatory Visit: Payer: Self-pay | Admitting: Internal Medicine

## 2017-12-01 NOTE — Telephone Encounter (Signed)
Please advise Dr Panosh, thanks.   

## 2017-12-01 NOTE — Telephone Encounter (Signed)
Copied from [place] [phone]. Topic: Quick Communication - See Telephone Encounter >> Dec 01, 2017 10:59 AM Conception Chancy, NT wrote: CRM for notification. See Telephone encounter for: 12/01/17.  Patient is needing a refill on traMADol (ULTRAM) 50 MG tablet Please advise.  [facility] [place], [place] [address] [place] [address]  Phone: [phone] Fax: [phone]

## 2017-12-01 NOTE — Telephone Encounter (Signed)
Tramadol (Ultram) refill Last OV: 08/08/17 Last Refill:08/24/17 #30 tab no refill Pharmacy:[facility] [address] [place], [place] PCP: Shanon Ace MD

## 2017-12-01 NOTE — Addendum Note (Signed)
Addended by: Corky Sox E on: 12/01/2017 11:46 AM   Modules accepted: Orders

## 2017-12-02 MED ORDER — TRAMADOL HCL 50 MG PO TABS: 50.0000 mg | ORAL_TABLET | Freq: Four times a day (QID) | ORAL | 0 refills | Status: DC | PRN

## 2017-12-02 NOTE — Telephone Encounter (Signed)
Rc phoned in to [facility] - spoke with Jonni Sanger. Pt aware via verified voicemail Nothing further needed.

## 2017-12-02 NOTE — Addendum Note (Signed)
Addended byShanon Ace K on: 12/02/2017 09:04 AM   Modules accepted: Orders

## 2017-12-02 NOTE — Addendum Note (Signed)
Addended by: Virl Cagey on: 12/02/2017 04:57 PM   Modules accepted: Orders

## 2017-12-02 NOTE — Telephone Encounter (Signed)
Ok x 1 no refills  Please phone in pharmacy doesn't take  E rx for cs

## 2017-12-07 ENCOUNTER — Ambulatory Visit (INDEPENDENT_AMBULATORY_CARE_PROVIDER_SITE_OTHER): Payer: Medicare Other | Admitting: Internal Medicine

## 2017-12-07 ENCOUNTER — Encounter: Payer: Self-pay | Admitting: Internal Medicine

## 2017-12-07 VITALS — BP 124/62 | HR 83 | Temp 98.6°F | Wt 178.2 lb

## 2017-12-07 DIAGNOSIS — R52 Pain, unspecified: Secondary | ICD-10-CM | POA: Diagnosis not present

## 2017-12-07 DIAGNOSIS — R6883 Chills (without fever): Secondary | ICD-10-CM

## 2017-12-07 DIAGNOSIS — J0101 Acute recurrent maxillary sinusitis: Secondary | ICD-10-CM

## 2017-12-07 DIAGNOSIS — J22 Unspecified acute lower respiratory infection: Secondary | ICD-10-CM

## 2017-12-07 LAB — POC INFLUENZA A&B (BINAX/QUICKVUE)
INFLUENZA A, POC: NEGATIVE
INFLUENZA B, POC: NEGATIVE

## 2017-12-07 MED ORDER — AMOXICILLIN-POT CLAVULANATE 875-125 MG PO TABS: 1.0000 | ORAL_TABLET | Freq: Two times a day (BID) | ORAL | 0 refills | Status: DC

## 2017-12-07 NOTE — Patient Instructions (Addendum)
This is a viral repiratory infection that should run its course.   And antibiotic dont help   Rest fluids   Tylenol for body aches     But  If right  Face pain pressure not getting better in then next  1-2 days then add antibiotic for sinusitis  . Can try afrin nose spray for 1-2 days if needed and warm compresses .   Your  Lung exam is good  Today .

## 2017-12-07 NOTE — Progress Notes (Signed)
Chief Complaint  Patient presents with  . Chills    Pt c/o chills and body aches at night x 2-3 days. Pt states that she has been having head congestion and decreased appetite x 1 week. Pt notes having green mucous x 1 week ago but is now clear. Headache and pressure in sinus. Taking migraine meds for HAs. No fever with symptoms.     HPI: Michele Monroe 72 y.o.   sda    Onset this week  Of above    No fever but achy chills and watery eyes and now ha  Frontal and right  nostril clogged this am  . Back strain lef thip.  Back braces.   Last week  Hx of sinusitis in past .  grandaughter  In school and  No ill  But little ones have.   One  Had ear infection.  ROS: See pertinent positives and negatives per HPI. No chest pain sob  Blood    New meds    Past Medical History:  Diagnosis Date  . ALLERGIC RHINITIS 06/30/2010   perennial  . Allergy    seasonal  . Coughing up blood 08/13/2012   piece of tissue? will send to path poss from upper airway sinusitis  Final path was "abscess"   . GERD 06/30/2010  . Headache(784.0) 06/30/2010   migraines  . Hepatic steatosis   . HYPERLIPIDEMIA 06/30/2010  . HYPERTENSION 06/30/2010  . IBS 06/30/2010  . Internal hemorrhoids   . Renal lesion   . Varicose veins   . VITAMIN D DEFICIENCY 06/30/2010    Family History  Problem Relation Age of Onset  . Colon cancer Mother        age 37  . Breast cancer Mother   . Arthritis Brother   . Other Brother   . Breast cancer Cousin     Social History   Socioeconomic History  . Marital status: Widowed    Spouse name: Not on file  . Number of children: 2  . Years of education: Not on file  . Highest education level: Not on file  Occupational History  . 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 Financial resource strain: Not on file  . Food insecurity:    Worry: Not on file    Inability: Not on file  . Transportation needs:    Medical: Not on file   Non-medical: Not on file  Tobacco Use  . Smoking status: Former Smoker    Types: Cigarettes    Last attempt to quit: 09/06/1976    Years since quitting: 41.2  . Smokeless tobacco: Never Used  Substance and Sexual Activity  . Alcohol use: No    Alcohol/week: 0.0 oz    Comment: rarely  . Drug use: No  . Sexual activity: Not on file  Lifestyle  . Physical activity:    Days per week: Not on file    Minutes per session: Not on file  . Stress: Not on file  Relationships  . Social connections:    Talks on phone: Not on file    Gets together: Not on file    Attends religious service: Not on file    Active member of club or organization: Not on file    Attends meetings of clubs or organizations: Not on file    Relationship status: Not on file  Other Topics Concern  . 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       Lives alone with her two small dogs in a one story home. 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     Outpatient Medications Prior to Visit  Medication Sig Dispense Refill  . atenolol (TENORMIN) 25 MG tablet TAKE ONE TABLET BY MOUTH EVERY DAY 30 tablet 11  . atorvastatin (LIPITOR) 20 MG tablet TAKE ONE TABLET BY MOUTH EVERY DAY 30 tablet 11  . dicyclomine (BENTYL) 20 MG tablet TAKE ONE TABLET BY MOUTH TWICE DAILY 60 tablet 11  . hydrocortisone 2.5 % cream PLACE 1 APPLICATION RECTALLY 2 (TWO) TIMES DAILY AS NEEDED FOR HEMORRHOIDS OR ITCHING. 28.35 g 0  . Multiple Vitamins-Minerals (MULTIVITAMIN ADULT PO) Take by mouth.    Marland Kitchen omeprazole (PRILOSEC OTC) 20 MG tablet Take 20 mg by mouth daily.    Marland Kitchen OVER THE COUNTER MEDICATION Reported on 12/01/2015    . rizatriptan (MAXALT) 10 MG tablet TAKE ONE TABLET BY MOUTH AT ONSET ON MIGRAINE, MAY REPEAT IN 2 HOURS  12 tablet 0  . traMADol (ULTRAM) 50 MG tablet Take 1 tablet (50 mg total) by mouth every 6 (six) hours as needed. for pain 30 tablet 0   No facility-administered medications prior to visit.      EXAM:  BP 124/62 (BP Location: Right Arm, Patient Position: Sitting, Cuff Size: Normal)   Pulse 83   Temp 98.6 F (37 C) (Oral)   Wt 178 lb 3.2 oz (80.8 kg)   BMI 29.20 kg/m   Body mass index is 29.2 kg/m. WDWN in NAD  quiet respirations; mildly congested  somewhat hoarse. Non toxic .  Unde rthe weather  HEENT: Normocephalic ;atraumatic , Eyes;  PERRL, EOMs  Full, lids and conjunctiva clear,,Ears: no deformities, canals nl, TM landmarks normal, left some wax in canal  Nose: no deformity or discharge but congested;face 1+tender  r maxilla and frontal area Mouth : OP clear without lesion or edema . Neck: Supple without adenopathy or masses or bruits Chest:  Clear to A& without wheezes rales or rhonchi CV:  S1-S2 no gallops or murmurs peripheral perfusion is normal Skin :nl perfusion and no acute rashes  poct flu negative   ASSESSMENT AND PLAN:  Discussed the following assessment and plan:  Chills - Plan: POC Influenza A&B (Binax test)  Acute recurrent maxillary sinusitis  Body aches - Plan: POC Influenza A&B (Binax test)  Acute respiratory infection Viral type illness   With  Sinus infection   Localizing sx   See  Advise  Cab add antibiotic  If needed  -Patient advised to return or notify health care team  if symptoms worsen ,persist or new concerns arise.  Patient Instructions  This is a viral repiratory infection that should run its course.   And antibiotic dont help   Rest fluids   Tylenol for body aches     But  If right  Face pain pressure not getting better in then next  1-2 days then add antibiotic for sinusitis  . Can try afrin nose spray for 1-2 days if needed and warm compresses .   Your  Lung exam is good  Today .     [name]. Erinn Huskins M.D.

## 2017-12-27 ENCOUNTER — Ambulatory Visit: Payer: Medicare Other | Admitting: Adult Health

## 2018-01-11 ENCOUNTER — Encounter: Payer: Self-pay | Admitting: Internal Medicine

## 2018-01-11 ENCOUNTER — Ambulatory Visit (INDEPENDENT_AMBULATORY_CARE_PROVIDER_SITE_OTHER): Payer: Medicare Other | Admitting: Internal Medicine

## 2018-01-11 VITALS — BP 112/68 | HR 98 | Temp 97.7°F | Wt 170.7 lb

## 2018-01-11 DIAGNOSIS — K529 Noninfective gastroenteritis and colitis, unspecified: Secondary | ICD-10-CM

## 2018-01-11 MED ORDER — ONDANSETRON 4 MG PO TBDP: 4.0000 mg | ORAL_TABLET | Freq: Three times a day (TID) | ORAL | 0 refills | Status: DC | PRN

## 2018-01-11 NOTE — Patient Instructions (Signed)
This acts like a  Stomach virus      That should get better  Over the next few days .   But may take   A week to be totally better   Keep fluids   To avoid dehydration .      Sending in a medicine   For nausea .  If needed can try imodium OTC for diarrehea  Doesn't cure just  slows down temporarily   If   Not improving in the next    48 hours we need to get a stool test to check for  ( Germ called  c diff .    )     Viral Gastroenteritis, Adult Viral gastroenteritis is also known as the stomach flu. This condition is caused by various viruses. These viruses can be passed from person to person very easily (are very contagious). This condition may affect your stomach, small intestine, and large intestine. It can cause sudden watery diarrhea, fever, and vomiting. Diarrhea and vomiting can make you feel weak and cause you to become dehydrated. You may not be able to keep fluids down. Dehydration can make you tired and thirsty, cause you to have a dry mouth, and decrease how often you urinate. Older adults and people with other diseases or a weak immune system are at higher risk for dehydration. It is important to replace the fluids that you lose from diarrhea and vomiting. If you become severely dehydrated, you may need to get fluids through an IV tube. What are the causes? Gastroenteritis is caused by various viruses, including rotavirus and norovirus. Norovirus is the most common cause in adults. You can get sick by eating food, drinking water, or touching a surface contaminated with one of these viruses. You can also get sick from sharing utensils or other personal items with an infected person. What increases the risk? This condition is more likely to develop in people:  Who have a weak defense system (immune system).  Who live with one or more children who are younger than 59 years old.  Who live in a nursing home.  Who go on cruise ships.  What are the signs or symptoms? Symptoms of  this condition start suddenly 1-2 days after exposure to a virus. Symptoms may last a few days or as long as a week. The most common symptoms are watery diarrhea and vomiting. Other symptoms include:  Fever.  Headache.  Fatigue.  Pain in the abdomen.  Chills.  Weakness.  Nausea.  Muscle aches.  Loss of appetite.  How is this diagnosed? This condition is diagnosed with a medical history and physical exam. You may also have a stool test to check for viruses or other infections. How is this treated? This condition typically goes away on its own. The focus of treatment is to restore lost fluids (rehydration). Your health care provider may recommend that you take an oral rehydration solution (ORS) to replace important salts and minerals (electrolytes) in your body. Severe cases of this condition may require giving fluids through an IV tube. Treatment may also include medicine to help with your symptoms. Follow these instructions at home: Follow instructions from your health care provider about how to care for yourself at home. Eating and drinking Follow these recommendations as told by your health care provider:  Take an ORS. This is a drink that is sold at pharmacies and retail stores.  Drink clear fluids in small amounts as you are able. Clear fluids include water,  ice chips, diluted fruit juice, and low-calorie sports drinks.  Eat bland, easy-to-digest foods in small amounts as you are able. These foods include bananas, applesauce, rice, lean meats, toast, and crackers.  Avoid fluids that contain a lot of sugar or caffeine, such as energy drinks, sports drinks, and soda.  Avoid alcohol.  Avoid spicy or fatty foods.  General instructions   Drink enough fluid to keep your urine clear or pale yellow.  Wash your hands often. If soap and water are not available, use hand sanitizer.  Make sure that all people in your household wash their hands well and often.  Take  over-the-counter and prescription medicines only as told by your health care provider.  Rest at home while you recover.  Watch your condition for any changes.  Take a warm bath to relieve any burning or pain from frequent diarrhea episodes.  Keep all follow-up visits as told by your health care provider. This is important. Contact a health care provider if:  You cannot keep fluids down.  Your symptoms get worse.  You have new symptoms.  You feel light-headed or dizzy.  You have muscle cramps. Get help right away if:  You have chest pain.  You feel extremely weak or you faint.  You see blood in your vomit.  Your vomit looks like coffee grounds.  You have bloody or black stools or stools that look like tar.  You have a severe headache, a stiff neck, or both.  You have a rash.  You have severe pain, cramping, or bloating in your abdomen.  You have trouble breathing or you are breathing very quickly.  Your heart is beating very quickly.  Your skin feels cold and clammy.  You feel confused.  You have pain when you urinate.  You have signs of dehydration, such as: ? Dark urine, very little urine, or no urine. ? Cracked lips. ? Dry mouth. ? Sunken eyes. ? Sleepiness. ? Weakness. This information is not intended to replace advice given to you by your health care provider. Make sure you discuss any questions you have with your health care provider. Document Released: 08/23/2005 Document Revised: 02/04/2016 Document Reviewed: 04/29/2015 Elsevier Interactive Patient Education  [employer].

## 2018-01-11 NOTE — Progress Notes (Signed)
Chief Complaint  Patient presents with  . Nausea    Nausea and vomting since 01/08/18 with diarrhea. Pt c/o HA and decreased energy.    HPI: Michele Monroe 72 y.o. come in for   sda    Sudden onset  Vomiting   May 4th and then bad diarrhea runny  And since then .was better    .      to we very tired and feels sick but no fever  Water fluids ok but not solids  ROS: See pertinent positives and negatives per HPI.  Son in law with vomiting and diarrhea .  and stayed out  Work  Monday.  But  Then better  Antibiotic from .    last  Visit   April 3 rd     Took a few days later ( augmentint) but  No se finished   Cared for   [place] child 2 yo had 2 day  Saturday   Day before . Had  diarrhea   No fever.  And she  Had ear infection  .    Had no rash   No meds   Past Medical History:  Diagnosis Date  . ALLERGIC RHINITIS 06/30/2010   perennial  . Allergy    seasonal  . Coughing up blood 08/13/2012   piece of tissue? will send to path poss from upper airway sinusitis  Final path was "abscess"   . GERD 06/30/2010  . Headache(784.0) 06/30/2010   migraines  . Hepatic steatosis   . HYPERLIPIDEMIA 06/30/2010  . HYPERTENSION 06/30/2010  . IBS 06/30/2010  . Internal hemorrhoids   . Renal lesion   . Varicose veins   . VITAMIN D DEFICIENCY 06/30/2010    Family History  Problem Relation Age of Onset  . Colon cancer Mother        age 69  . Breast cancer Mother   . Arthritis Brother   . Other Brother   . Breast cancer Cousin     Social History   Socioeconomic History  . Marital status: Widowed    Spouse name: Not on file  . Number of children: 2  . Years of education: Not on file  . Highest education level: Not on file  Occupational History  . 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 Financial resource strain: Not on file  . Food insecurity:    Worry: Not on file    Inability: Not on file  . Transportation needs:    Medical: Not on file   Non-medical: Not on file  Tobacco Use  . Smoking status: Former Smoker    Types: Cigarettes    Last attempt to quit: 09/06/1976    Years since quitting: 41.3  . Smokeless tobacco: Never Used  Substance and Sexual Activity  . Alcohol use: No    Alcohol/week: 0.0 oz    Comment: rarely  . Drug use: No  . Sexual activity: Not on file  Lifestyle  . Physical activity:    Days per week: Not on file    Minutes per session: Not on file  . Stress: Not on file  Relationships  . Social connections:    Talks on phone: Not on file    Gets together: Not on file    Attends religious service: Not on file    Active member of club or organization: Not on file    Attends meetings of clubs or organizations: Not on file  Relationship status: Not on file  Other Topics Concern  . 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       Lives alone with her two small dogs in a one story home. 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     Outpatient Medications Prior to Visit  Medication Sig Dispense Refill  . atenolol (TENORMIN) 25 MG tablet TAKE ONE TABLET BY MOUTH EVERY DAY 30 tablet 11  . atorvastatin (LIPITOR) 20 MG tablet TAKE ONE TABLET BY MOUTH EVERY DAY 30 tablet 11  . dicyclomine (BENTYL) 20 MG tablet TAKE ONE TABLET BY MOUTH TWICE DAILY 60 tablet 11  . hydrocortisone 2.5 % cream PLACE 1 APPLICATION RECTALLY 2 (TWO) TIMES DAILY AS NEEDED FOR HEMORRHOIDS OR ITCHING. 28.35 g 0  . Multiple Vitamins-Minerals (MULTIVITAMIN ADULT PO) Take by mouth.    Marland Kitchen omeprazole (PRILOSEC OTC) 20 MG tablet Take 20 mg by mouth daily.    Marland Kitchen OVER THE COUNTER MEDICATION Reported on 12/01/2015    . rizatriptan (MAXALT) 10 MG tablet TAKE ONE TABLET BY MOUTH AT ONSET ON MIGRAINE, MAY REPEAT IN 2 HOURS  12 tablet 0  . traMADol (ULTRAM) 50 MG tablet Take 1 tablet (50 mg total) by mouth every 6 (six) hours as needed. for pain 30 tablet 0  . amoxicillin-clavulanate (AUGMENTIN) 875-125 MG tablet Take 1 tablet by mouth every 12 (twelve) hours. For sinusitis (Patient not taking: Reported on 01/11/2018) 14 tablet 0   No facility-administered medications prior to visit.      EXAM:  BP 112/68 (BP Location: Right Arm, Patient Position: Sitting, Cuff Size: Normal)   Pulse 98   Temp 97.7 F (36.5 C) (Oral)   Wt 170 lb 11.2 oz (77.4 kg)   BMI 27.97 kg/m   Body mass index is 27.97 kg/m.  GENERAL: vitals reviewed and listed above, alert, oriented, appears well hydrated and in no acute distress looks washed out but non toxic  HEENT: atraumatic, conjunctiva  clear, no obvious abnormalities on inspection of external nose and ears OP : no lesion edema or exudate  Moist mm  NECK: no obvious masses on inspection palpation  LUNGS: clear to auscultation bilaterally, no wheezes, rales or rhonchi, good air movement CV: HRRR, no clubbing cyanosis or  peripheral edema nl cap refill  Abdomen:  Sof,t normal to increased  bowel sounds without hepatosplenomegaly, no guarding rebound or masses no CVA tenderness MS: moves all extremities without noticeable focal  abnormality PSYCH: pleasant and cooperative, no obvious depression or anxiety skin no rash acute   BP Readings from Last 3 Encounters:  01/11/18 112/68  12/07/17 124/62  08/08/17 122/78    ASSESSMENT AND PLAN:  Discussed the following assessment and plan:  Gastroenteritis, infectious, presumed - see txt  recent  antibiotic  usage  Has risk for c diff with antibiotic   month ago but no there  Alarm findings   However   Sx rx and  Close observation warranted     Expectant management.   for fu   -Patient advised to return or notify health care team  if  new concerns arise. Total visit 34mins > 50% spent counseling and coordinating care as  indicated in  above note and in instructions to patient .   Patient Instructions  This acts like a  Stomach virus      That should get better  Over the next few days .   But may take   A week to be totally better   Keep fluids   To avoid dehydration .      Sending in a medicine   For nausea .  If needed can try imodium OTC for diarrehea  Doesn't cure just  slows down temporarily   If   Not improving in the next    48 hours we need to get a stool test to check for  ( Germ called  c diff .    )     Viral Gastroenteritis, Adult Viral gastroenteritis is also known as the stomach flu. This condition is caused by various viruses. These viruses can be passed from person to person very easily (are very contagious). This condition may affect your stomach, small intestine, and large intestine. It can cause sudden watery diarrhea, fever, and vomiting. Diarrhea and vomiting can make you feel weak and cause you to become dehydrated. You may not be able to keep fluids down. Dehydration can make you tired and thirsty, cause you to have a dry mouth, and decrease how often you urinate. Older adults and people with other diseases or a weak immune system are at higher risk for dehydration. It is important to replace the fluids that you lose from diarrhea and vomiting. If you become severely dehydrated, you may need to get fluids through an IV tube. What are the causes? Gastroenteritis is caused by various viruses, including rotavirus and norovirus. Norovirus is the most common cause in adults. You can get sick by eating food, drinking water, or touching a surface contaminated with one of these viruses. You can also get sick from sharing utensils or other personal items with an infected person. What increases the risk? This condition is more likely to develop in people:  Who have a weak defense system (immune system).  Who live with one or more children who are younger than 74 years old.  Who live in a nursing  home.  Who go on cruise ships.  What are the signs or symptoms? Symptoms of this condition start suddenly 1-2 days after exposure to a virus. Symptoms may last a few days or as long as a week. The most common symptoms are watery diarrhea and vomiting. Other symptoms include:  Fever.  Headache.  Fatigue.  Pain in the abdomen.  Chills.  Weakness.  Nausea.  Muscle aches.  Loss of appetite.  How is this diagnosed? This condition is diagnosed with a medical history and physical exam. You may also have a stool test to check for viruses or other infections. How is this treated? This condition typically goes away on its own. The focus of treatment is to restore lost fluids (rehydration). Your health care provider may recommend that you take an oral rehydration solution (ORS) to replace important salts and minerals (electrolytes) in your body. Severe cases of this condition may require giving fluids through an IV tube. Treatment may also include medicine to help with your symptoms. Follow these instructions at home: Follow instructions from your health care provider about how to care for yourself at home. Eating and drinking Follow these recommendations as told by your health care provider:  Take an ORS. This is a drink that is sold at pharmacies and retail  stores.  Drink clear fluids in small amounts as you are able. Clear fluids include water, ice chips, diluted fruit juice, and low-calorie sports drinks.  Eat bland, easy-to-digest foods in small amounts as you are able. These foods include bananas, applesauce, rice, lean meats, toast, and crackers.  Avoid fluids that contain a lot of sugar or caffeine, such as energy drinks, sports drinks, and soda.  Avoid alcohol.  Avoid spicy or fatty foods.  General instructions   Drink enough fluid to keep your urine clear or pale yellow.  Wash your hands often. If soap and water are not available, use hand sanitizer.  Make sure  that all people in your household wash their hands well and often.  Take over-the-counter and prescription medicines only as told by your health care provider.  Rest at home while you recover.  Watch your condition for any changes.  Take a warm bath to relieve any burning or pain from frequent diarrhea episodes.  Keep all follow-up visits as told by your health care provider. This is important. Contact a health care provider if:  You cannot keep fluids down.  Your symptoms get worse.  You have new symptoms.  You feel light-headed or dizzy.  You have muscle cramps. Get help right away if:  You have chest pain.  You feel extremely weak or you faint.  You see blood in your vomit.  Your vomit looks like coffee grounds.  You have bloody or black stools or stools that look like tar.  You have a severe headache, a stiff neck, or both.  You have a rash.  You have severe pain, cramping, or bloating in your abdomen.  You have trouble breathing or you are breathing very quickly.  Your heart is beating very quickly.  Your skin feels cold and clammy.  You feel confused.  You have pain when you urinate.  You have signs of dehydration, such as: ? Dark urine, very little urine, or no urine. ? Cracked lips. ? Dry mouth. ? Sunken eyes. ? Sleepiness. ? Weakness. This information is not intended to replace advice given to you by your health care provider. Make sure you discuss any questions you have with your health care provider. Document Released: 08/23/2005 Document Revised: 02/04/2016 Document Reviewed: 04/29/2015 Elsevier Interactive Patient Education  2018 [place]. Crescent Gotham M.D.

## 2018-01-19 ENCOUNTER — Other Ambulatory Visit: Payer: Self-pay | Admitting: Internal Medicine

## 2018-01-19 DIAGNOSIS — Z1231 Encounter for screening mammogram for malignant neoplasm of breast: Secondary | ICD-10-CM

## 2018-01-27 ENCOUNTER — Other Ambulatory Visit: Payer: Self-pay | Admitting: Neurology

## 2018-02-17 ENCOUNTER — Telehealth: Payer: Self-pay | Admitting: Internal Medicine

## 2018-02-17 NOTE — Telephone Encounter (Signed)
Pt want to know will dr.Panosh prescribe maxalt for her. Please gave her a call.

## 2018-02-23 ENCOUNTER — Ambulatory Visit
Admission: RE | Admit: 2018-02-23 | Discharge: 2018-02-23 | Disposition: A | Discharge status: Home / Self Care | Payer: Medicare Other | Source: Ambulatory Visit | Attending: Internal Medicine | Admitting: Internal Medicine

## 2018-02-23 DIAGNOSIS — Z1231 Encounter for screening mammogram for malignant neoplasm of breast: Secondary | ICD-10-CM

## 2018-02-23 NOTE — Telephone Encounter (Signed)
Pt is calling and does not want to run of her medication. Pt would like call back

## 2018-02-24 NOTE — Telephone Encounter (Signed)
Last Rxd 08/05/17 #12  Last OV 01/11/18  Please advise if able to refill this medication. Thanks.

## 2018-02-27 ENCOUNTER — Telehealth: Payer: Self-pay | Admitting: Neurology

## 2018-02-27 DIAGNOSIS — G43809 Other migraine, not intractable, without status migrainosus: Secondary | ICD-10-CM

## 2018-02-27 MED ORDER — RIZATRIPTAN BENZOATE 10 MG PO TABS: 10.0000 mg | ORAL_TABLET | ORAL | 1 refills | Status: DC | PRN

## 2018-02-27 NOTE — Telephone Encounter (Signed)
Patient called needing a refill on the Rizatriptan medication. She only has 3 left. She uses [facility]. She needs a 30 day supply. Thanks

## 2018-02-27 NOTE — Telephone Encounter (Signed)
I want patient to be seeing  neurologist to help with the headache control medications.  ( see past notes  and referrals)   In the short run can refill 6# only    Please have her see the neurologist last seen last year ?   For help with headache management

## 2018-03-01 NOTE — Telephone Encounter (Signed)
Looks like order was sent in on 02/27/2018 by Dr. Tomi Likens.

## 2018-03-10 ENCOUNTER — Ambulatory Visit: Payer: Medicare Other | Admitting: Neurology

## 2018-03-10 ENCOUNTER — Encounter: Payer: Self-pay | Admitting: Neurology

## 2018-03-10 VITALS — BP 96/84 | HR 73 | Ht 65.0 in | Wt 178.0 lb

## 2018-03-10 DIAGNOSIS — G43809 Other migraine, not intractable, without status migrainosus: Secondary | ICD-10-CM

## 2018-03-10 DIAGNOSIS — M4312 Spondylolisthesis, cervical region: Secondary | ICD-10-CM | POA: Diagnosis not present

## 2018-03-10 MED ORDER — TIZANIDINE HCL 4 MG PO TABS: ORAL_TABLET | ORAL | 0 refills | Status: DC

## 2018-03-10 MED ORDER — TRAMADOL HCL 50 MG PO TABS: 50.0000 mg | ORAL_TABLET | Freq: Four times a day (QID) | ORAL | 3 refills | Status: DC | PRN

## 2018-03-10 NOTE — Progress Notes (Signed)
NEUROLOGY FOLLOW UP OFFICE NOTE  SANORA CUNANAN [id number]  HISTORY OF PRESENT ILLNESS: Michele Monroe is a 72 year old right-handed female with hypertension, GERD, IBS, hyperlipidemia who follows up for cervicogenic migraines.    UPDATE: MRI of cervical spine from 06/06/17 was personally reviewed and demonstrated multilevel spondylolisthesis with cervical facet arthropathy with severe neural foraminal stenosis at bilateral C6 and left C7.  She was evaluated by interventional pain management who told her nothing interventional was warranted as it was all arthritic changes.  Intensity:  3 to 10/10 Duration:  2 hours Frequency:  Every 2 weeks Abortive therapy:  Rizatriptan 10mg  Migraines are often preceded by neck and shoulder pain/tension.  Tramadol is often effective for neck and shoulder pain.  She has tried heat and ice, exercises, massage.   HISTORY: Onset:  72 years old Location:  Bilateral occipital region Quality:  throbbing Initial intensity:  Starts as 3/10 and progresses to 10/10 She has chronic neck pain that is worse with neck movement. Aura:  no Prodrome:  no Postdrome:  nausea Associated symptoms:  Sometimes nausea and vomiting.  Photophobia and phonophobia.  No visual disturbance..  She has not had any new worse headache of her life, waking up from sleep Initial Duration:  Several days without treatment (30 minutes with Maxalt) Initial Frequency:  2 to 3 days per week Initial Frequency of abortive medication: 2 days per week Triggers/exacerbating factors:  Neck movement, change in barometric pressure Relieving factors:  Maxalt Activity:  Aggravates.  Needs to lay down   Past NSAIDS:  Ibuprofen, Aleve, Excedrin Past analgesics:  Excedrin, Tylenol, tramadol Past abortive triptans:  no Past muscle relaxants:  Flexeril Past anti-emetic:  Yes but uncertain of type Past antihypertensive medications:  no Past antidepressant medications:  Unsure.  Not amitriptyline,  nortriptyline or Effexor Past anticonvulsant medications:  topiramate (ineffective) Past vitamins/Herbal/Supplements:  no Other past therapies:  no   Family history of headache:  Daughter has migraines   06/15/16 LABS:  CBC with WBC 8.9, HGB 13.8, HCT 40.7, PLT 242; BMP with Na 140, K 4.5, Cl 102, CO2 30, glucose 93, BUN 13 and Cr 0.90; hepatic panel with total bili 0.4, ALP 75, AST 17 and ALT 12.   Report of an old CT of the head from 06/24/05 (images not currently available for review) demonstrated "two areas of low density in the inferior left basal ganglia" felt to be more likely prominent perivascular spaces rather than old lacunar infarcts.  CT of cervical spine on the same day demonstrated multilevel degenerative changes.   She denies radicular pain down the arms but notes that she wakes up with bilateral hand numbness, which resolves when she shakes her hands out.  PAST MEDICAL HISTORY: Past Medical History:  Diagnosis Date  . ALLERGIC RHINITIS 06/30/2010   perennial  . Allergy    seasonal  . Coughing up blood 08/13/2012   piece of tissue? will send to path poss from upper airway sinusitis  Final path was "abscess"   . GERD 06/30/2010  . Headache(784.0) 06/30/2010   migraines  . Hepatic steatosis   . HYPERLIPIDEMIA 06/30/2010  . HYPERTENSION 06/30/2010  . IBS 06/30/2010  . Internal hemorrhoids   . Renal lesion   . Varicose veins   . VITAMIN D DEFICIENCY 06/30/2010    MEDICATIONS: Current Outpatient Medications on File Prior to Visit  Medication Sig Dispense Refill  . atenolol (TENORMIN) 25 MG tablet TAKE ONE TABLET BY MOUTH EVERY DAY 30 tablet  11  . atorvastatin (LIPITOR) 20 MG tablet TAKE ONE TABLET BY MOUTH EVERY DAY 30 tablet 11  . dicyclomine (BENTYL) 20 MG tablet TAKE ONE TABLET BY MOUTH TWICE DAILY 60 tablet 11  . hydrocortisone 2.5 % cream PLACE 1 APPLICATION RECTALLY 2 (TWO) TIMES DAILY AS NEEDED FOR HEMORRHOIDS OR ITCHING. 28.35 g 0  . Multiple  Vitamins-Minerals (MULTIVITAMIN ADULT PO) Take by mouth.    Marland Kitchen omeprazole (PRILOSEC OTC) 20 MG tablet Take 20 mg by mouth daily.    . ondansetron (ZOFRAN-ODT) 4 MG disintegrating tablet Take 1 tablet (4 mg total) by mouth every 8 (eight) hours as needed for nausea or vomiting. 20 tablet 0  . OVER THE COUNTER MEDICATION Reported on 12/01/2015    . rizatriptan (MAXALT) 10 MG tablet TAKE ONE TABLET BY MOUTH AT ONSET ON MIGRAINE, MAY REPEAT IN 2 HOURS 12 tablet 0  . rizatriptan (MAXALT) 10 MG tablet Take 1 tablet (10 mg total) by mouth as needed for migraine. May repeat in 2 hours if needed 10 tablet 1   No current facility-administered medications on file prior to visit.     ALLERGIES: No Known Allergies  FAMILY HISTORY: Family History  Problem Relation Age of Onset  . Colon cancer Mother        age 82  . Breast cancer Mother   . Arthritis Brother   . Other Brother   . Breast cancer Cousin     SOCIAL HISTORY: Social History   Socioeconomic History  . Marital status: Widowed    Spouse name: Not on file  . Number of children: 2  . Years of education: Not on file  . Highest education level: Not on file  Occupational History  . 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 Financial resource strain: Not on file  . Food insecurity:    Worry: Not on file    Inability: Not on file  . Transportation needs:    Medical: Not on file    Non-medical: Not on file  Tobacco Use  . Smoking status: Former Smoker    Types: Cigarettes    Last attempt to quit: 09/06/1976    Years since quitting: 41.5  . Smokeless tobacco: Never Used  Substance and Sexual Activity  . Alcohol use: No    Alcohol/week: 0.0 oz    Comment: rarely  . Drug use: No  . Sexual activity: Not on file  Lifestyle  . Physical activity:    Days per week: Not on file    Minutes per session: Not on file  . Stress: Not on file  Relationships  . Social connections:    Talks on phone: Not  on file    Gets together: Not on file    Attends religious service: Not on file    Active member of club or organization: Not on file    Attends meetings of clubs or organizations: Not on file    Relationship status: Not on file  . Intimate partner violence:    Fear of current or ex partner: Not on file    Emotionally abused: Not on file    Physically abused: Not on file    Forced sexual activity: Not on file  Other Topics Concern  . 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       Lives alone with her two small dogs in a one story home. 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     REVIEW OF SYSTEMS: Constitutional: No fevers, chills, or sweats, no generalized fatigue, change in appetite Eyes: No visual changes, double vision, eye pain Ear, nose and throat: No hearing loss, ear pain, nasal congestion, sore throat Cardiovascular: No chest pain, palpitations Respiratory:  No shortness of breath at rest or with exertion, wheezes GastrointestinaI: No nausea, vomiting, diarrhea, abdominal pain, fecal incontinence Genitourinary:  No dysuria, urinary retention or frequency Musculoskeletal:  Neck pain Integumentary: No rash, pruritus, skin lesions Neurological: as above Psychiatric: No depression, insomnia, anxiety Endocrine: No palpitations, fatigue, diaphoresis, mood swings, change in appetite, change in weight, increased thirst Hematologic/Lymphatic:  No purpura, petechiae. Allergic/Immunologic: no itchy/runny eyes, nasal congestion, recent allergic reactions, rashes  PHYSICAL EXAM: Vitals:   03/10/18 0950  BP: 96/84  Pulse: 73  SpO2: 94%   General: No acute distress.  Patient appears well-groomed.  normal body habitus. Head:   Normocephalic/atraumatic Eyes:  Fundi examined but not visualized Neck: supple, bilateral upper cervical paraspinal tenderness, full range of motion Heart:  Regular rate and rhythm Lungs:  Clear to auscultation bilaterally Back: No paraspinal tenderness Neurological Exam: alert and oriented to person, place, and time. Attention span and concentration intact, recent and remote memory intact, fund of knowledge intact.  Speech fluent and not dysarthric, language intact.  CN II-XII intact. Bulk and tone normal, muscle strength 5/5 throughout.  Sensation to light touch  intact.  Deep tendon reflexes 2+ throughout, toes downgoing  Finger to nose testing intact.  Gait normal, Romberg negative.  IMPRESSION: Cervicogenic headache Cervical spondylolisthesis  PLAN: 1.  Try tizanidine 2 to 4mg  at bedtime 2.  May take tramadol at onset of neck/shoulder pain. 3.  If neck pain proceeds to migraine, take Maxalt as prescribed 4.  Follow up in 4 months.  Metta Clines, DO  CC:  Dr. Regis Bill

## 2018-03-10 NOTE — Patient Instructions (Signed)
1.  Take tizanidine 1/2 to 1 tablet at bedtime for neck pain 2.  At earliest onset of neck pain/tension, take tramadol as directed.  If you develop a migraine, take rizatriptan at earliest onset of migraine and repeat dose after 2 hours if needed (do not exceed 2 tablets in 24 hours). 3.  Follow up in 4 months

## 2018-03-21 ENCOUNTER — Telehealth: Payer: Self-pay | Admitting: Neurology

## 2018-03-21 NOTE — Telephone Encounter (Signed)
Patient lmom regarding her Migraine medication Tramidol and not having heard from her Pharmacy. Please Call. Thanks

## 2018-03-21 NOTE — Telephone Encounter (Signed)
Called Pt, LMOVM advising her the tramadol and tizanidine were both sent in to [facility], the pharmacy she requested.

## 2018-03-27 ENCOUNTER — Other Ambulatory Visit: Payer: Self-pay | Admitting: Neurology

## 2018-03-29 ENCOUNTER — Telehealth: Payer: Self-pay | Admitting: Neurology

## 2018-03-29 NOTE — Telephone Encounter (Signed)
Patient called needing to let you know that her Tramadol prescription has not been received. Please Call. Thanks

## 2018-03-30 ENCOUNTER — Other Ambulatory Visit: Payer: Self-pay

## 2018-03-30 MED ORDER — TRAMADOL HCL 50 MG PO TABS: 50.0000 mg | ORAL_TABLET | Freq: Four times a day (QID) | ORAL | 3 refills | Status: DC | PRN

## 2018-03-30 NOTE — Telephone Encounter (Signed)
Called and spoke with Pt. I asked if she had ever rcvd Tramadol from [facility] before, she stated she had. I advised her I will fax the Rx again.

## 2018-04-03 ENCOUNTER — Other Ambulatory Visit: Payer: Self-pay | Admitting: Neurology

## 2018-04-11 ENCOUNTER — Telehealth: Payer: Self-pay | Admitting: Neurology

## 2018-04-11 NOTE — Telephone Encounter (Signed)
Patient's pharmacy called needing a refill for her Tizanidine 4mg  tablet.

## 2018-04-13 ENCOUNTER — Other Ambulatory Visit: Payer: Self-pay

## 2018-04-13 MED ORDER — TIZANIDINE HCL 4 MG PO TABS: ORAL_TABLET | ORAL | 1 refills | Status: DC

## 2018-05-02 ENCOUNTER — Other Ambulatory Visit: Payer: Self-pay | Admitting: Gastroenterology

## 2018-05-23 ENCOUNTER — Other Ambulatory Visit: Payer: Self-pay | Admitting: Gastroenterology

## 2018-06-15 ENCOUNTER — Other Ambulatory Visit: Payer: Self-pay | Admitting: Neurology

## 2018-06-21 ENCOUNTER — Ambulatory Visit: Payer: Medicare Other | Admitting: Neurology

## 2018-06-21 ENCOUNTER — Encounter

## 2018-07-04 ENCOUNTER — Encounter: Payer: Self-pay | Admitting: Family Medicine

## 2018-07-04 ENCOUNTER — Ambulatory Visit (INDEPENDENT_AMBULATORY_CARE_PROVIDER_SITE_OTHER): Payer: Medicare Other | Admitting: Family Medicine

## 2018-07-04 VITALS — BP 138/80 | HR 83 | Temp 98.5°F | Resp 16 | Ht 65.5 in | Wt 176.8 lb

## 2018-07-04 DIAGNOSIS — J989 Respiratory disorder, unspecified: Secondary | ICD-10-CM

## 2018-07-04 DIAGNOSIS — R0789 Other chest pain: Secondary | ICD-10-CM | POA: Diagnosis not present

## 2018-07-04 DIAGNOSIS — J988 Other specified respiratory disorders: Secondary | ICD-10-CM

## 2018-07-04 DIAGNOSIS — R0989 Other specified symptoms and signs involving the circulatory and respiratory systems: Secondary | ICD-10-CM | POA: Diagnosis not present

## 2018-07-04 MED ORDER — IPRATROPIUM-ALBUTEROL 0.5-2.5 (3) MG/3ML IN SOLN
3.0000 mL | Freq: Once | RESPIRATORY_TRACT | Status: AC
  Administered 2018-07-04: 3 mL via RESPIRATORY_TRACT

## 2018-07-04 MED ORDER — DOXYCYCLINE HYCLATE 100 MG PO TABS: 100.0000 mg | ORAL_TABLET | Freq: Two times a day (BID) | ORAL | 0 refills | Status: DC

## 2018-07-04 MED ORDER — METHYLPREDNISOLONE ACETATE 40 MG/ML IJ SUSP
40.0000 mg | Freq: Once | INTRAMUSCULAR | Status: AC
  Administered 2018-07-04: 40 mg via INTRAMUSCULAR

## 2018-07-04 MED ORDER — ALBUTEROL SULFATE HFA 108 (90 BASE) MCG/ACT IN AERS: 2.0000 | INHALATION_SPRAY | Freq: Four times a day (QID) | RESPIRATORY_TRACT | 0 refills | Status: DC | PRN

## 2018-07-04 MED ORDER — HYDROCODONE-HOMATROPINE 5-1.5 MG/5ML PO SYRP: 5.0000 mL | ORAL_SOLUTION | Freq: Two times a day (BID) | ORAL | 0 refills | Status: DC | PRN

## 2018-07-04 NOTE — Progress Notes (Signed)
ACUTE VISIT  HPI:  Chief Complaint  Patient presents with  . Cough     x2-3 weeks, wheezing, coughing up thick yellow mucus, unable to eat,     Michele Monroe is a 72 y.o.female here today complaining of a couple weeks of respiratory symptoms. Productive cough with thick yellow sputum, she denies hemoptysis.  Yesterday she noted wheezing and shortness of breath. Today anterior chest tenderness with coughing spells. Symptoms seem to be exacerbated by exercise and alleviated by rest. No history of COPD or asthma. Former smoker.  She has not noted fever or body aches but had "a few" chills.   Cough  The current episode started 1 to 4 weeks ago. The problem has been gradually worsening. The cough is productive of sputum. Associated symptoms include chest pain (anterior chest with coughing.), chills, nasal congestion, postnasal drip, rhinorrhea, shortness of breath and wheezing. Pertinent negatives include no ear pain, eye redness, fever, headaches, heartburn, hemoptysis, myalgias, rash, sore throat or sweats. The symptoms are aggravated by exercise. Risk factors for lung disease include smoking/tobacco exposure. She has tried OTC cough suppressant for the symptoms. The treatment provided no relief. Her past medical history is significant for environmental allergies. There is no history of asthma or COPD.   No Hx of recent travel. Her grandchildren were sick recently with respiratory symptoms.  No known insect bite.  Hx of allergies: Yes, history of allergy rhinitis.  OTC medications for this problem: Cough drops, oils, and cough syrup.    Review of Systems  Constitutional: Positive for activity change, appetite change, chills and fatigue. Negative for fever.  HENT: Positive for congestion, postnasal drip and rhinorrhea. Negative for ear pain, mouth sores, sinus pressure, sneezing, sore throat, trouble swallowing and voice change.   Eyes: Negative for discharge, redness  and itching.  Respiratory: Positive for cough, shortness of breath and wheezing. Negative for hemoptysis.   Cardiovascular: Positive for chest pain (anterior chest with coughing.). Negative for leg swelling.  Gastrointestinal: Negative for abdominal pain, diarrhea, heartburn, nausea and vomiting.  Musculoskeletal: Negative for gait problem and myalgias.  Skin: Negative for rash.  Allergic/Immunologic: Positive for environmental allergies.  Neurological: Negative for syncope, weakness and headaches.  Psychiatric/Behavioral: Positive for sleep disturbance. Negative for confusion. The patient is nervous/anxious.       Current Outpatient Medications on File Prior to Visit  Medication Sig Dispense Refill  . atenolol (TENORMIN) 25 MG tablet TAKE ONE TABLET BY MOUTH EVERY DAY 30 tablet 11  . atorvastatin (LIPITOR) 20 MG tablet TAKE ONE TABLET BY MOUTH EVERY DAY 30 tablet 11  . dicyclomine (BENTYL) 20 MG tablet TAKE ONE TABLET BY MOUTH TWICE DAILY 60 tablet 11  . hydrocortisone 2.5 % cream PLACE 1 APPLICATION RECTALLY 2 (TWO) TIMES DAILY AS NEEDED FOR HEMORRHOIDS OR ITCHING. 28.35 g 0  . Multiple Vitamins-Minerals (MULTIVITAMIN ADULT PO) Take by mouth.    Marland Kitchen omeprazole (PRILOSEC OTC) 20 MG tablet Take 20 mg by mouth daily.    Marland Kitchen OVER THE COUNTER MEDICATION Reported on 12/01/2015    . rizatriptan (MAXALT) 10 MG tablet Take 1 tablet (10 mg total) by mouth as needed for migraine. May repeat in 2 hours if needed 10 tablet 1  . tiZANidine (ZANAFLEX) 4 MG tablet Take 1/2 to 1 tablet at bedtime for neck pain 30 tablet 1  . traMADol (ULTRAM) 50 MG tablet Take 1 tablet (50 mg total) by mouth every 6 (six) hours as needed. for pain  15 tablet 3   No current facility-administered medications on file prior to visit.      Past Medical History:  Diagnosis Date  . ALLERGIC RHINITIS 06/30/2010   perennial  . Allergy    seasonal  . Coughing up blood 08/13/2012   piece of tissue? will send to path poss from  upper airway sinusitis  Final path was "abscess"   . GERD 06/30/2010  . Headache(784.0) 06/30/2010   migraines  . Hepatic steatosis   . HYPERLIPIDEMIA 06/30/2010  . HYPERTENSION 06/30/2010  . IBS 06/30/2010  . Internal hemorrhoids   . Renal lesion   . Varicose veins   . VITAMIN D DEFICIENCY 06/30/2010   No Known Allergies  Social History   Socioeconomic History  . Marital status: Widowed    Spouse name: Not on file  . Number of children: 2  . Years of education: Not on file  . Highest education level: Not on file  Occupational History  . 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 Financial resource strain: Not on file  . Food insecurity:    Worry: Not on file    Inability: Not on file  . Transportation needs:    Medical: Not on file    Non-medical: Not on file  Tobacco Use  . Smoking status: Former Smoker    Types: Cigarettes    Last attempt to quit: 09/06/1976    Years since quitting: 41.8  . Smokeless tobacco: Never Used  Substance and Sexual Activity  . Alcohol use: No    Alcohol/week: 0.0 standard drinks    Comment: rarely  . Drug use: No  . Sexual activity: Not on file  Lifestyle  . Physical activity:    Days per week: Not on file    Minutes per session: Not on file  . Stress: Not on file  Relationships  . Social connections:    Talks on phone: Not on file    Gets together: Not on file    Attends religious service: Not on file    Active member of club or organization: Not on file    Attends meetings of clubs or organizations: Not on file    Relationship status: Not on file  Other Topics Concern  . 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       Lives alone with her two small dogs in a one story home. 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     Vitals:   07/04/18 1055 07/04/18 1145  BP: 138/80   Pulse: 83   Resp: 16   Temp: 98.5 F (36.9 C)   SpO2: 95% 99%   Body mass index is 29.42 kg/m.   Physical Exam  Nursing note and vitals reviewed. Constitutional: She is oriented to person, place, and time. She appears well-developed. She does not appear ill. No distress.  HENT:  Head: Normocephalic and atraumatic.  Right Ear: External ear and ear canal normal. Tympanic membrane is not erythematous. A middle ear effusion is present.  Left Ear: External ear normal.  Nose: Rhinorrhea present. Right sinus exhibits no maxillary sinus tenderness and no frontal sinus  tenderness. Left sinus exhibits no maxillary sinus tenderness and no frontal sinus tenderness.  Mouth/Throat: Oropharynx is clear and moist and mucous membranes are normal.  Excess cerumen left ear canal, I cannot see TM.  Eyes: Conjunctivae are normal.  Neck: No muscular tenderness present. No edema and no erythema present.  Cardiovascular: Normal rate and regular rhythm.  No murmur heard. Respiratory: Effort normal. No stridor. No respiratory distress. She has wheezes. She exhibits tenderness.  Lymphadenopathy:       Head (right side): No submandibular adenopathy present.       Head (left side): No submandibular adenopathy present.    She has no cervical adenopathy.  Neurological: She is alert and oriented to person, place, and time. She has normal strength.  Skin: Skin is warm. No rash noted. No erythema.  Psychiatric: She has a normal mood and affect. Her speech is normal.  Well groomed, good eye contact.      ASSESSMENT AND PLAN:  Michele Monroe was seen today for cough.  Diagnoses and all orders for this visit:  Reactive airway disease that is not asthma  After verbal consent she received breathing treatment with DuoNeb. Auscultation negative for rales, she has some diffuse rhonchi's and  wheezing, better ventilation. She is not interested in oral prednisone, instead she would like to have a steroid injection.  Depo-Medrol 40 mg IM given here in the office. Albuterol inh 2 puff every 6 hours for a week then as needed for wheezing or shortness of breath.  She was clearly instructed about warning signs. Follow-up with PCP in 7 to 10 days.  -     albuterol (PROVENTIL HFA;VENTOLIN HFA) 108 (90 Base) MCG/ACT inhaler; Inhale 2 puffs into the lungs every 6 (six) hours as needed for wheezing or shortness of breath.  Respiratory tract infection  Because of persistent symptoms as well as new onset of wheezing and dyspnea, I am recommending oral antibiotic. We will hold on imaging for now. OTC plain Mucinex and adequate hydration may also help with the cough. Explained that cough and congestion can last a few days and even weeks. Follow-up with PCP in 7 to 10 days, before if needed.  -     doxycycline (VIBRA-TABS) 100 MG tablet; Take 1 tablet (100 mg total) by mouth 2 (two) times daily for 7 days. -     methylPREDNISolone acetate (DEPO-MEDROL) injection 40 mg -     ipratropium-albuterol (DUONEB) 0.5-2.5 (3) MG/3ML nebulizer solution 3 mL   Chest wall pain Chest pain she reported seems to be musculoskeletal, most likely caused by persistent cough. I do not think further work-up is needed today. She was instructed about warning signs. Follow-up with PCP.   Other orders -     HYDROcodone-homatropine (HYCODAN) 5-1.5 MG/5ML syrup; Take 5 mLs by mouth every 12 (twelve) hours as needed for cough.    After DuoNeb breathing treatment she started complaining of difficulty breathing and has some stridor-like sound, she was anxious. On examination I did not appreciate throat edema and O2 sats where between 98 and 100%. Auscultation after breathing treatment persistent wheezing and some rhonchi's, no rales.  We were ready to treat with epinephrine and to call 911, she refused going to the  ER.  After a couple minutes of couching her breathing, slow and deep respiratory exercises she started feeling better. Observation: 15 min. She stay here in observation until she fell today to drive, O2 saturation was 100%.  No stridor or respiratory distress.  I  do not think symptoms with an allergy reaction to DuoNeb or Depo-Medrol but rather acute anxiety.  She feels ready to drive, symptoms resolved. She was clearly instructed about warning signs, she voices understanding.     Juel Ripley G. Martinique, MD  [facility]. [place] office.

## 2018-07-04 NOTE — Patient Instructions (Addendum)
  [name] I have seen you today for an acute visit.  A few things to remember from today's visit:   Respiratory tract infection - Plan: doxycycline (VIBRA-TABS) 100 MG tablet, methylPREDNISolone acetate (DEPO-MEDROL) injection 40 mg, ipratropium-albuterol (DUONEB) 0.5-2.5 (3) MG/3ML nebulizer solution 3 mL  Reactive airway disease that is not asthma - Plan: albuterol (PROVENTIL HFA;VENTOLIN HFA) 108 (90 Base) MCG/ACT inhaler   If medications prescribed today, they will not be refill upon request, a follow up appointment with PCP will be necessary to discuss continuation of of treatment if appropriate.     In general please monitor for signs of worsening symptoms and seek immediate medical attention if any concerning.    I hope you get better soon!

## 2018-07-07 ENCOUNTER — Telehealth: Payer: Self-pay | Admitting: Internal Medicine

## 2018-07-07 ENCOUNTER — Other Ambulatory Visit: Payer: Self-pay | Admitting: Family Medicine

## 2018-07-07 DIAGNOSIS — R0989 Other specified symptoms and signs involving the circulatory and respiratory systems: Secondary | ICD-10-CM

## 2018-07-07 DIAGNOSIS — J989 Respiratory disorder, unspecified: Secondary | ICD-10-CM

## 2018-07-07 DIAGNOSIS — J988 Other specified respiratory disorders: Secondary | ICD-10-CM

## 2018-07-07 MED ORDER — DOXYCYCLINE HYCLATE 100 MG PO TABS: 100.0000 mg | ORAL_TABLET | Freq: Two times a day (BID) | ORAL | 0 refills | Status: AC

## 2018-07-07 MED ORDER — HYDROCODONE-HOMATROPINE 5-1.5 MG/5ML PO SYRP: 5.0000 mL | ORAL_SOLUTION | Freq: Two times a day (BID) | ORAL | 0 refills | Status: AC | PRN

## 2018-07-07 MED ORDER — ALBUTEROL SULFATE HFA 108 (90 BASE) MCG/ACT IN AERS: 2.0000 | INHALATION_SPRAY | Freq: Four times a day (QID) | RESPIRATORY_TRACT | 0 refills | Status: DC | PRN

## 2018-07-07 NOTE — Telephone Encounter (Signed)
Copied from [place] [id number]. Topic: Quick Communication - See Telephone Encounter >> Jul 07, 2018 11:46 AM Ahmed Prima L wrote: CRM for notification. See Telephone encounter for: 07/07/18.  Patient was seen in the office on 10/29 and she states these medications were sent to the mail order pharmacy and patient really needs these sent to [id number] - [place], [place] - [address] [address]  albuterol (PROVENTIL HFA;VENTOLIN HFA) 108 (90 Base) MCG/ACT inhaler doxycycline (VIBRA-TABS) 100 MG tablet  HYDROcodone-homatropine (HYCODAN) 5-1.5 MG/5ML syrup

## 2018-07-07 NOTE — Telephone Encounter (Addendum)
Medications were sent to the wrong pharmacy when called in 07/04/18 Need to call [facility] to cancel previously sent Rxs Please advise Dr Martinique to resend medications to the correct pharmacy. thanks.

## 2018-07-07 NOTE — Telephone Encounter (Signed)
Prescriptions re-sent to local pharmacy. Betty Martinique, MD

## 2018-07-07 NOTE — Telephone Encounter (Signed)
Pt called to check status

## 2018-07-07 NOTE — Telephone Encounter (Signed)
Rx's sent to [facility], called mail order to cancel Rx's.

## 2018-07-12 ENCOUNTER — Ambulatory Visit: Payer: Medicare Other | Admitting: Neurology

## 2018-08-02 ENCOUNTER — Other Ambulatory Visit: Payer: Self-pay | Admitting: Internal Medicine

## 2018-08-04 ENCOUNTER — Other Ambulatory Visit: Payer: Self-pay | Admitting: Internal Medicine

## 2018-09-04 ENCOUNTER — Other Ambulatory Visit: Payer: Self-pay | Admitting: Internal Medicine

## 2018-09-04 ENCOUNTER — Other Ambulatory Visit: Payer: Self-pay | Admitting: Neurology

## 2018-09-14 ENCOUNTER — Other Ambulatory Visit: Payer: Self-pay | Admitting: Neurology

## 2018-09-18 DIAGNOSIS — H2513 Age-related nuclear cataract, bilateral: Secondary | ICD-10-CM | POA: Diagnosis not present

## 2018-11-05 ENCOUNTER — Other Ambulatory Visit: Payer: Self-pay | Admitting: Internal Medicine

## 2018-12-01 ENCOUNTER — Other Ambulatory Visit: Payer: Self-pay

## 2018-12-01 NOTE — Patient Outreach (Signed)
[facility]) Care Management  12/01/2018  MADDELYN ROCCA 1946-04-20 [id number]   Medication Adherence call to Mrs. [place] Compliant Voice message left with a call back number. Mrs. Lesage is showing past due on Atorvastatin 20 mg under [place] [facility].   [place] Management Direct Dial [phone]  Fax [phone] [email or web address]

## 2018-12-04 ENCOUNTER — Ambulatory Visit: Payer: Self-pay | Admitting: Internal Medicine

## 2018-12-04 NOTE — Telephone Encounter (Signed)
Call returned to patient to f/u on symptoms from previous voice message.  Reported headache and nasal stuffiness over past 10 days.  Stated she isn't sure if she has a sinus infection, or if it is related to Allergies.  C/o headache discomfort over eye and temple region, bilaterally; rated pain at 7/10.  C/o feeling right ear congestion; denied earache.  C/o intermittent cough and "some shortness of breath with both rest and activity."  Nasal drainage is clear to yellow.  Reported her cough is intermittently productive of yellow phlegm.  Denied fever.  Has tried Claritin without relief of sx's.  Taking Rizatriptan 10 mg daily, for headache.  Denied any travel,or exposure to anyone with known diagnosis of COVID 19 within past 14 days.  Has remained at home, and practiced social distancing.  Appt. Scheduled at 8:30 AM, 3/31 with PCP.  Care advice given per protocol.  Verb. Understanding.  Agrees with plan.  Is agreeable to doing a Web Ex or Telephonic appt.     Reason for Disposition . [1] Sinus congestion (pressure, fullness) AND [2] present > 10 days  Answer Assessment - Initial Assessment Questions 1. LOCATION: "Where does it hurt?"      Around eyes and temple region  2. ONSET: "When did the sinus pain start?"  (e.g., hours, days)      About one-two weeks ago 3. SEVERITY: "How bad is the pain?"   (Scale 1-10; mild, moderate or severe)   - MILD (1-3): doesn't interfere with normal activities    - MODERATE (4-7): interferes with normal activities (e.g., work or school) or awakens from sleep   - SEVERE (8-10): excruciating pain and patient unable to do any normal activities        7/10 4. RECURRENT SYMPTOM: "Have you ever had sinus problems before?" If so, ask: "When was the last time?" and "What happened that time?"      yes 5. NASAL CONGESTION: "Is the nose blocked?" If so, ask, "Can you open it or must you breathe through the mouth?"     Stuffy nose  6. NASAL DISCHARGE: "Do you have discharge  from your nose?" If so ask, "What color?"     Clear to yellow  7. FEVER: "Do you have a fever?" If so, ask: "What is it, how was it measured, and when did it start?"      No fever 8. OTHER SYMPTOMS: "Do you have any other symptoms?" (e.g., sore throat, cough, earache, difficulty breathing)     Headache, right ear congestion, coughing 1st thing in AM; occas. wheeze; some shortness of breath with rest and activity.  Intermittent productive cough with yellow phlegm  9. PREGNANCY: "Is there any chance you are pregnant?" "When was your last menstrual period?"     N/a  Protocols used: SINUS PAIN OR CONGESTION-A-AH

## 2018-12-04 NOTE — Telephone Encounter (Signed)
fyi

## 2018-12-04 NOTE — Telephone Encounter (Signed)
Rec'd vm on COVID message line with request for return call to discuss symptoms of "congestion and cough."  Attempted to call pt.;  Left vm to call office to speak to Triage nurse.

## 2018-12-04 NOTE — Progress Notes (Signed)
Virtual Visit via Video Note  I connected with@ on 12/05/18 at  8:30 AM EDT by a video enabled telemedicine application and verified that I am speaking with the correct person using two identifiers. Location patient: home Location provider:work office Persons participating in the virtual visit: patient, provider  [place] national recommendations  regarding COVID 19 pandemic   video visit is advised over in office visit for this patient.  Discussed the limitations of evaluation and management by telemedicine and  availability of in person appointments. The patient expressed understanding and agreed to proceed.   HPI: Michele Monroe Onset of   Ur sx  About  2-3 weeks ago   And  Now ongoing not getting better and now with headache bifrontal no fever  And ocntkinueou nasal drainage and cough  Thick yellow.  Some sob but doesn't feel badly   Taking calritin  incs no help in past   Hx of recurrent sinusitis and has allergy flares seasonal .  No travel or exposures to covid risk  Takes ocass trmadol for should er  Pain   Out   ROS: See pertinent positives and negatives per HPI. No cp hemoptysis   Past Medical History:  Diagnosis Date  . ALLERGIC RHINITIS 06/30/2010   perennial  . Allergy    seasonal  . Coughing up blood 08/13/2012   piece of tissue? will send to path poss from upper airway sinusitis  Final path was "abscess"   . GERD 06/30/2010  . Headache(784.0) 06/30/2010   migraines  . Hepatic steatosis   . HYPERLIPIDEMIA 06/30/2010  . HYPERTENSION 06/30/2010  . IBS 06/30/2010  . Internal hemorrhoids   . Renal lesion   . Varicose veins   . VITAMIN D DEFICIENCY 06/30/2010    Past Surgical History:  Procedure Laterality Date  . BREAST SURGERY     needle bx left breast  . COLONOSCOPY    . feet     surgery  . ROTATOR CUFF REPAIR Right   . varicose veins      Family History  Problem Relation Age of Onset  . Colon cancer Mother        age 67  . Breast cancer Mother   .  Arthritis Brother   . Other Brother   . Breast cancer Cousin     SOCIAL HX:    Current Outpatient Medications:  .  atenolol (TENORMIN) 25 MG tablet, Take 1 tablet by mouth every day **DUE FOR ANNUAL VISIT, Disp: 30 tablet, Rfl: 0 .  atorvastatin (LIPITOR) 20 MG tablet, Take 1 tablet by mouth every day, Disp: 30 tablet, Rfl: 11 .  dicyclomine (BENTYL) 20 MG tablet, TAKE ONE TABLET BY MOUTH TWICE DAILY, Disp: 60 tablet, Rfl: 11 .  Multiple Vitamins-Minerals (MULTIVITAMIN ADULT PO), Take by mouth., Disp: , Rfl:  .  omeprazole (PRILOSEC OTC) 20 MG tablet, Take 20 mg by mouth daily., Disp: , Rfl:  .  OVER THE COUNTER MEDICATION, Reported on 12/01/2015, Disp: , Rfl:  .  rizatriptan (MAXALT) 10 MG tablet, Take 1 tablet (10 mg total) by mouth as needed for migraine. May repeat in 2 hours if needed, Disp: 10 tablet, Rfl: 1 .  tiZANidine (ZANAFLEX) 4 MG tablet, Take 1/2 to 1 tablet by mouth at bedtime for neck pain, Disp: 30 tablet, Rfl: 3 .  traMADol (ULTRAM) 50 MG tablet, Take 1 tablet by mouth every 6 hours as needed, Disp: 15 tablet, Rfl: 5 .  amoxicillin-clavulanate (AUGMENTIN) 875-125 MG tablet, Take 1  tablet by mouth every 12 (twelve) hours. For sinusitis, Disp: 14 tablet, Rfl: 0  EXAM:  VITALS per patient if applicable:  GENERAL: alert, oriented, appears well and in no acute distress looks very congested  Non toxic   HEENT: atraumatic, conjunttiva clear, no obvious abnormalities on inspection of external nose and ears  NECK: normal movements of the head and neck no ob adenopathy   LUNGS: on inspection no signs of respiratory distress, breathing rate appears normal, no obvious gross SOB, gasping or wheezing  CV: no obvious cyanosis  MS: moves all visible extremities without noticeable abnormality  PSYCH/NEURO: pleasant and cooperative, no obvious depression or anxiety, speech and thought processing grossly intact  ASSESSMENT AND PLAN:  Discussed the following assessment and  plan:  Acute recurrent sinusitis, unspecified location  Medication management  Seasonal allergic rhinitis, unspecified trigger  hox of recurrent sinusitis   Seen ent in past   Ongoing  resp sx for week  Poss allergy triggered   Sinus hygiene add antibiotic     She should call her mail away as the  Tramadol  Should have had 5 refills ( from 12 19  Per dr Loretta Plume)  But is using it for shoulder  Not just ha.   Expectant management and discussion of plan and treatment with patient with opportunity to ask questions and all were answered. The patient agreed with the plan and demonstrated an understanding of the instructions.  plan set up for cpx   laste May or  June  If all is well  As  Possible depending on pandemic limitations She  Asks to keep up  On my chart .   Due for fu of medical conditions  The patient was advised to call back or seek an in-person evaluation if worsening having concerns    or if the condition fails to improve as anticipated.   Shanon Ace, MD

## 2018-12-05 ENCOUNTER — Ambulatory Visit (INDEPENDENT_AMBULATORY_CARE_PROVIDER_SITE_OTHER): Payer: Medicare Other | Admitting: Internal Medicine

## 2018-12-05 ENCOUNTER — Ambulatory Visit: Payer: Self-pay | Admitting: *Deleted

## 2018-12-05 ENCOUNTER — Encounter: Payer: Self-pay | Admitting: Internal Medicine

## 2018-12-05 ENCOUNTER — Other Ambulatory Visit: Payer: Self-pay

## 2018-12-05 DIAGNOSIS — J302 Other seasonal allergic rhinitis: Secondary | ICD-10-CM | POA: Diagnosis not present

## 2018-12-05 DIAGNOSIS — J0191 Acute recurrent sinusitis, unspecified: Secondary | ICD-10-CM

## 2018-12-05 DIAGNOSIS — Z79899 Other long term (current) drug therapy: Secondary | ICD-10-CM | POA: Diagnosis not present

## 2018-12-05 MED ORDER — AMOXICILLIN-POT CLAVULANATE 875-125 MG PO TABS: 1.0000 | ORAL_TABLET | Freq: Two times a day (BID) | ORAL | 0 refills | Status: DC

## 2018-12-05 NOTE — Telephone Encounter (Signed)
Contacted pt regarding her symptoms; she states that she picked up the medication for her sinus infection; she said she has already had a visit with Dr Regis Bill via webcam this morning; she has no further concerns.

## 2018-12-12 ENCOUNTER — Other Ambulatory Visit: Payer: Self-pay | Admitting: Neurology

## 2018-12-12 ENCOUNTER — Other Ambulatory Visit: Payer: Self-pay | Admitting: Internal Medicine

## 2018-12-15 ENCOUNTER — Telehealth: Payer: Medicare Other | Admitting: Gastroenterology

## 2018-12-15 ENCOUNTER — Ambulatory Visit: Payer: Self-pay | Admitting: Internal Medicine

## 2018-12-15 DIAGNOSIS — R21 Rash and other nonspecific skin eruption: Secondary | ICD-10-CM

## 2018-12-15 DIAGNOSIS — W57XXXA Bitten or stung by nonvenomous insect and other nonvenomous arthropods, initial encounter: Secondary | ICD-10-CM

## 2018-12-15 MED ORDER — DOXYCYCLINE HYCLATE 100 MG PO CAPS: 200.0000 mg | ORAL_CAPSULE | Freq: Once | ORAL | 0 refills | Status: AC

## 2018-12-15 NOTE — Progress Notes (Signed)
Thank you for describing your tick bite, Here is how we plan to help! Based on the information that you shared with me it looks like you have A tick that bite that we will treat with a short course of doxycycline.  In most cases a tick bite is painless and does not itch.  Most tick bites in which the tick is quickly removed do not require prescriptions. Ticks can transmit several diseases if they are infected and remain attacked to your skin. Therefore the length that the tick was attached and any symptoms you have experienced after the bite are import to accurately develop your custom treatment plan. In most cases a single dose of doxycycline may prevent the development of a more serious condition.  Based on your information I have Provided a home care guide for tick bites  and  instructions on when to call for help. and I have sent a single dose of doxycycline to the pharmacy you selected. Please make sure that you selected a pharmacy that is open now.  Which ticks  are associated with illness?  The Wood Tick (dog tick) is the size of a watermelon seed and can sometimes transmit [place] spotted fever and [place] tick fever.   The Deer Tick (black-legged tick) is between the size of a poppy seed (pin head) and an apple seed, and can sometimes transmit Lyme disease.  A brown to black tick with a white splotch on its back is likely a female Amblyomma americanum ([place] tick). This tick has been associated with Southern Tick Associated illness ( STARI)  Lyme disease has become the most common tick-borne illness in the [place]. The risk of Lyme disease following a recognized deer tick bite is estimated to be 1%.  The majority of cases of Lyme disease start with a bull's eye rash at the site of the tick bite. The rash can occur days to weeks (typically 7-10 days) after a tick bite. Treatment with antibiotics is indicated if this rash appears. Flu-like symptoms may accompany the rash,  including: fever, chills, headaches, muscle aches, and fatigue. Removing ticks promptly may prevent tick borne disease.  What can be used to prevent Tick Bites?   Insect repellant with at leas 20% DEET.  Wearing long pants with sock and shoes.  Avoiding tall grass and heavily wooded areas.  Checking your skin after being outdoors.  Shower with a washcloth after outdoor exposures.  HOME CARE ADVICE FOR TICK BITE  1. Wood Tick Removal:  o Use a pair of tweezers and grasp the wood tick close to the skin (on its head). Pull the wood tick straight upward without twisting or crushing it. Maintain a steady pressure until it releases its grip.   o If tweezers aren't available, use fingers, a loop of thread around the jaws, or a needle between the jaws for traction.  o Note: covering the tick with petroleum jelly, nail polish or rubbing alcohol doesn't work. Neither does touching the tick with a hot or cold object. 2. Tiny Deer Tick Removal:   o Needs to be scraped off with a knife blade or credit card edge. o Place tick in a sealed container (e.g. glass jar, zip lock plastic bag), in case your doctor wants to see it. 3. Tick's Head Removal:  o If the wood tick's head breaks off in the skin, it must be removed. Clean the skin. Then use a sterile needle to uncover the head and lift it out   or scrape it off.  o If a very small piece of the head remains, the skin will eventually slough it off. 4. Antibiotic Ointment:  o Wash the wound and your hands with soap and water after removal to prevent catching any tick disease.  Apply an over the counter antibiotic ointment (e.g. bacitracin) to the bite once. 5. Expected Course: Tick bites normally don't itch or hurt. That's why they often go unnoticed. 6. Call Your Doctor If:  o You can't remove the tick or the tick's head o Fever, a severe head ache, or rash occur in the next 2 weeks o Bite begins to look infected o Lyme's disease is common in your  area o You have not had a tetanus in the last 10 years o Your current symptoms become worse    MAKE SURE YOU   Understand these instructions.  Will watch your condition.  Will get help right away if you are not doing well or get worse.   Thank you for choosing an e-visit.  Your e-visit answers were reviewed by a board certified advanced clinical practitioner to complete your personal care plan. Depending upon the condition, your plan could have included both over the counter or prescription medications. Please review your pharmacy choice. If there is a problem you may use MyChart messaging to have the prescription routed to another pharmacy. Your safety is important to us. If you have drug allergies check your prescription carefully.   You can use MyChart to ask questions about today's visit, request a non-urgent call back, or ask for a work or school excuse for 24 hours related to this e-Visit. If it has been greater than 24 hours you will need to follow up with your provider, or enter a new e-Visit to address those concerns.  You will get an email in the next two days asking about your experience. I hope  that your e-visit has been valuable and will speed your recovery 

## 2018-12-15 NOTE — Telephone Encounter (Signed)
Pt. Has a tick bite on upper, inner thigh. Has talked to the practice about it. Today it is purple around the bite, which is new. Will try to have an e-visit via My Chart.  Answer Assessment - Initial Assessment Questions 1. TYPE of INSECT: "What type of insect was it?"      Tick 2. ONSET: "When did you get bitten?"      4/8 3. LOCATION: "Where is the insect bite located?"      Left upper thigh 4. REDNESS: "Is the area red or pink?" If so, ask "What size is area of redness?" (inches or cm). "When did the redness start?"     Bruising - purple color 5. PAIN: "Is there any pain?" If so, ask: "How bad is it?"  (Scale 1-10; or mild, moderate, severe)     No 6. ITCHING: "Does it itch?" If so, ask: "How bad is the itch?"    - MILD: doesn't interfere with normal activities   - MODERATE - SEVERE: interferes with work, school, sleep, or other activities      Moderate 7. SWELLING: "How big is the swelling?" (inches, cm, or compare to coins)     None 8. OTHER SYMPTOMS: "Do you have any other symptoms?"  (e.g., difficulty breathing, hives)     No 9. PREGNANCY: "Is there any chance you are pregnant?" "When was your last menstrual period?"     No  Protocols used: INSECT BITE-A-AH

## 2018-12-18 NOTE — Telephone Encounter (Signed)
Tried to call pt to setup appt.

## 2018-12-19 ENCOUNTER — Other Ambulatory Visit: Payer: Self-pay | Admitting: Internal Medicine

## 2018-12-19 DIAGNOSIS — G43809 Other migraine, not intractable, without status migrainosus: Secondary | ICD-10-CM

## 2018-12-19 NOTE — Telephone Encounter (Signed)
Please advise 

## 2018-12-20 NOTE — Telephone Encounter (Signed)
All oral meds  Requested are prescribed by Dr Tomi Likens   Neurology  whho treating her headaches  Request should go through to  him     Ok to refill the pro air  x 1 with 2 refills  From me

## 2018-12-20 NOTE — Telephone Encounter (Signed)
Please advise 

## 2018-12-27 NOTE — Telephone Encounter (Signed)
error 

## 2018-12-27 NOTE — Telephone Encounter (Signed)
I am not prescribing this medication  Dr Loretta Plume is prescriber  This is an old message

## 2019-01-01 ENCOUNTER — Ambulatory Visit (INDEPENDENT_AMBULATORY_CARE_PROVIDER_SITE_OTHER): Payer: Medicare Other | Admitting: Internal Medicine

## 2019-01-01 ENCOUNTER — Encounter: Payer: Self-pay | Admitting: Internal Medicine

## 2019-01-01 ENCOUNTER — Other Ambulatory Visit: Payer: Self-pay

## 2019-01-01 DIAGNOSIS — W57XXXA Bitten or stung by nonvenomous insect and other nonvenomous arthropods, initial encounter: Secondary | ICD-10-CM

## 2019-01-01 DIAGNOSIS — S80869A Insect bite (nonvenomous), unspecified lower leg, initial encounter: Secondary | ICD-10-CM

## 2019-01-01 NOTE — Telephone Encounter (Signed)
Spoke with patient and she has sent pictures of the tick bites to Mychart.  Would you like a doxy.me appointment?

## 2019-01-01 NOTE — Telephone Encounter (Signed)
Left message on machine for patient to return our call CRM 

## 2019-01-01 NOTE — Telephone Encounter (Signed)
Doxy.me appointment made 01/01/2019.

## 2019-01-01 NOTE — Telephone Encounter (Signed)
Appointment scheduled.

## 2019-01-01 NOTE — Progress Notes (Signed)
Virtual Visit via Video Note  I connected with@ on 01/01/19 at  1:15 PM EDT by a video enabled telemedicine application and verified that I am speaking with the correct person using two identifiers. Location patient: home Location provider:work  office Persons participating in the virtual visit: patient, provider  [place] national recommendations  regarding COVID 19 pandemic   video visit is advised over in office visit for this patient.  Discussed the limitations of evaluation and management by telemedicine and  availability of in person appointments. The patient expressed understanding and agreed to proceed.   HPI: Michele Monroe  Presents w  For VV  For new  tick bite  See my chart  Concern about  [place] ar[place] bite   Noted yesterday  And  Back of leg  And noted  And had neighbor help take it off . now itchy and   Irritated but no fever other rashes   Sending in pix.   Exposures  Walks dogs but no high grass otherwise .    Had e visit  About 3-4 weeks ago and was given  Doxy prophylaxis   .  Had area in groin.   ROS: See pertinent positives and negatives per HPI.  Past Medical History:  Diagnosis Date  . ALLERGIC RHINITIS 06/30/2010   perennial  . Allergy    seasonal  . Coughing up blood 08/13/2012   piece of tissue? will send to path poss from upper airway sinusitis  Final path was "abscess"   . GERD 06/30/2010  . Headache(784.0) 06/30/2010   migraines  . Hepatic steatosis   . HYPERLIPIDEMIA 06/30/2010  . HYPERTENSION 06/30/2010  . IBS 06/30/2010  . Internal hemorrhoids   . Renal lesion   . Varicose veins   . VITAMIN D DEFICIENCY 06/30/2010    Past Surgical History:  Procedure Laterality Date  . BREAST SURGERY     needle bx left breast  . COLONOSCOPY    . feet     surgery  . ROTATOR CUFF REPAIR Right   . varicose veins      Family History  Problem Relation Age of Onset  . Colon cancer Mother        age 73  . Breast cancer Mother   . Arthritis Brother   .  Other Brother   . Breast cancer Cousin     Social History   Tobacco Use  . Smoking status: Former Smoker    Types: Cigarettes    Last attempt to quit: 09/06/1976    Years since quitting: 42.3  . Smokeless tobacco: Never Used  Substance Use Topics  . Alcohol use: No    Alcohol/week: 0.0 standard drinks    Comment: rarely  . Drug use: No      Current Outpatient Medications:  .  atenolol (TENORMIN) 25 MG tablet, Take 1 tablet by mouth every day, Disp: 90 tablet, Rfl: 2 .  atorvastatin (LIPITOR) 20 MG tablet, Take 1 tablet by mouth every day, Disp: 30 tablet, Rfl: 11 .  dicyclomine (BENTYL) 20 MG tablet, TAKE ONE TABLET BY MOUTH TWICE DAILY, Disp: 60 tablet, Rfl: 11 .  Multiple Vitamins-Minerals (MULTIVITAMIN ADULT PO), Take by mouth., Disp: , Rfl:  .  omeprazole (PRILOSEC OTC) 20 MG tablet, Take 20 mg by mouth daily., Disp: , Rfl:  .  OVER THE COUNTER MEDICATION, Reported on 12/01/2015, Disp: , Rfl:  .  PROAIR HFA 108 (90 Base) MCG/ACT inhaler, Inhale 2 puffs by mouth every 6 hours as  needed for wheezing or shortness of breath., Disp: 8.5 each, Rfl: 11 .  rizatriptan (MAXALT) 10 MG tablet, Take 1 tablet by mouth at the onset of migraine, may repeat in 2 hours. Max of 2 doses per day, Disp: 11 tablet, Rfl: 5 .  tiZANidine (ZANAFLEX) 4 MG tablet, Take 1/2 to 1 tablet by mouth at bedtime for neck pain, Disp: 30 tablet, Rfl: 5 .  traMADol (ULTRAM) 50 MG tablet, Take 1 tablet by mouth every 6 hours as needed, Disp: 15 tablet, Rfl: 2  EXAM: BP Readings from Last 3 Encounters:  07/04/18 138/80  03/10/18 96/84  01/11/18 112/68    VITALS per patient if applicable:  GENERAL: alert, oriented, appears well and in no acute distress  HEENT: atraumatic, conjunttiva clear, no obvious abnormalities on inspection of external nose and ears  NECK: normal movements of the head and neck  LUNGS: on inspection no signs of respiratory distress, breathing rate appears normal, no obvious gross SOB,  gasping or wheezing  CV: no obvious cyanosis  MS: moves all visible extremities without noticeable abnormality Back of calf shows small red  Dusky   Less than 1 cm area  Of bite   PSYCH/NEURO: pleasant and cooperative, no obvious depression or anxiety, speech and thought processing grossly intact   ASSESSMENT AND PLAN:  Discussed the following assessment and plan:  Tick bite of lower leg, unspecified laterality, initial encounter - mild local reactiton lower risk attachment  New tick bite  Reaction  Low risk   Less than 72 hour attachment and local    Options to prp=ophylax  Shared Decision Making at this time will not use medication but contact if any systemic sx rash etc and disc   Best prevention and checking as possible  For rash  Fever etc.    Counseled.   Expectant management and discussion of plan and treatment with patient with opportunity to ask questions and all were answered. The patient agreed with the plan and demonstrated an understanding of the instructions.   The patient was advised to call back or seek an in-person evaluation if worsening  or having concerns .   Shanon Ace, MD

## 2019-01-01 NOTE — Telephone Encounter (Signed)
  I see a pix of a tick and not of the bite?  Yes  Advise  visual visit  Doxy   (or send in pix of the  Area of concern )

## 2019-01-03 ENCOUNTER — Encounter: Payer: Self-pay | Admitting: Internal Medicine

## 2019-01-03 ENCOUNTER — Ambulatory Visit (INDEPENDENT_AMBULATORY_CARE_PROVIDER_SITE_OTHER): Payer: Medicare Other | Admitting: Internal Medicine

## 2019-01-03 ENCOUNTER — Other Ambulatory Visit: Payer: Self-pay

## 2019-01-03 ENCOUNTER — Ambulatory Visit: Payer: Self-pay | Admitting: Internal Medicine

## 2019-01-03 DIAGNOSIS — W57XXXA Bitten or stung by nonvenomous insect and other nonvenomous arthropods, initial encounter: Secondary | ICD-10-CM | POA: Diagnosis not present

## 2019-01-03 DIAGNOSIS — S20162A Insect bite (nonvenomous) of breast, left breast, initial encounter: Secondary | ICD-10-CM | POA: Diagnosis not present

## 2019-01-03 MED ORDER — DOXYCYCLINE HYCLATE 100 MG PO TABS: ORAL_TABLET | ORAL | 0 refills | Status: DC

## 2019-01-03 NOTE — Telephone Encounter (Signed)
Pt. Reports she pulled a tick off last night that had bitten her twice.abdominal tenderness her bra line, more to the left. Areas are 1 inch in diameter - red and swollen. Very itchy. Warm transfer to [facility] in the practice for virtual visit. Answer Assessment - Initial Assessment Questions 1. TYPE of INSECT: "What type of insect was it?"      Tick 2. ONSET: "When did you get bitten?"      At bra line more to the left - last night 3. LOCATION: "Where is the insect bite located?"      At the bra line 4. REDNESS: "Is the area red or pink?" If so, ask "What size is area of redness?" (inches or cm). "When did the redness start?"     2 spots - 1 inch 5. PAIN: "Is there any pain?" If so, ask: "How bad is it?"  (Scale 1-10; or mild, moderate, severe)     No 6. ITCHING: "Does it itch?" If so, ask: "How bad is the itch?"    - MILD: doesn't interfere with normal activities   - MODERATE - SEVERE: interferes with work, school, sleep, or other activities      Severe 7. SWELLING: "How big is the swelling?" (inches, cm, or compare to coins)     Swollen 8. OTHER SYMPTOMS: "Do you have any other symptoms?"  (e.g., difficulty breathing, hives)     No 9. PREGNANCY: "Is there any chance you are pregnant?" "When was your last menstrual period?"     n/a  Protocols used: INSECT BITE-A-AH

## 2019-01-03 NOTE — Progress Notes (Signed)
Virtual Visit via Video Note  I connected with@ on 01/03/19 at 11:00 AM EDT by a video enabled telemedicine application and verified that I am speaking with the correct person using two identifiers. Location patient: home Location provider:work  office Persons participating in the virtual visit: patient, provider  [place] national recommendations  regarding COVID 19 pandemic   video visit is advised over in office visit for this patient.  imitations of evaluation and management by telemedicine and  availability of in person appointments. The patient expressed understanding and agreed to proceed.   HPI: Michele Monroe  Presents and asks for VV   And contact  Has NEW     Bites and toick taken off her left infra mammary area   Thinks it was only on one day  Was over at grandchild's  visitn some grass   Feels well otherwise but is a bit anxious about   So many ticks she is findings  No fever rash  ROS: See pertinent positives and negatives per HPI.  Past Medical History:  Diagnosis Date  . ALLERGIC RHINITIS 06/30/2010   perennial  . Allergy    seasonal  . Coughing up blood 08/13/2012   piece of tissue? will send to path poss from upper airway sinusitis  Final path was "abscess"   . GERD 06/30/2010  . Headache(784.0) 06/30/2010   migraines  . Hepatic steatosis   . HYPERLIPIDEMIA 06/30/2010  . HYPERTENSION 06/30/2010  . IBS 06/30/2010  . Internal hemorrhoids   . Renal lesion   . Varicose veins   . VITAMIN D DEFICIENCY 06/30/2010    Past Surgical History:  Procedure Laterality Date  . BREAST SURGERY     needle bx left breast  . COLONOSCOPY    . feet     surgery  . ROTATOR CUFF REPAIR Right   . varicose veins      Family History  Problem Relation Age of Onset  . Colon cancer Mother        age 106  . Breast cancer Mother   . Arthritis Brother   . Other Brother   . Breast cancer Cousin     Social History   Tobacco Use  . Smoking status: Former Smoker    Types:  Cigarettes    Last attempt to quit: 09/06/1976    Years since quitting: 42.3  . Smokeless tobacco: Never Used  Substance Use Topics  . Alcohol use: No    Alcohol/week: 0.0 standard drinks    Comment: rarely  . Drug use: No      Current Outpatient Medications:  .  atenolol (TENORMIN) 25 MG tablet, Take 1 tablet by mouth every day, Disp: 90 tablet, Rfl: 2 .  atorvastatin (LIPITOR) 20 MG tablet, Take 1 tablet by mouth every day, Disp: 30 tablet, Rfl: 11 .  dicyclomine (BENTYL) 20 MG tablet, TAKE ONE TABLET BY MOUTH TWICE DAILY, Disp: 60 tablet, Rfl: 11 .  doxycycline (VIBRA-TABS) 100 MG tablet, Take 2 po x 1, Disp: 2 tablet, Rfl: 0 .  Multiple Vitamins-Minerals (MULTIVITAMIN ADULT PO), Take by mouth., Disp: , Rfl:  .  omeprazole (PRILOSEC OTC) 20 MG tablet, Take 20 mg by mouth daily., Disp: , Rfl:  .  OVER THE COUNTER MEDICATION, Reported on 12/01/2015, Disp: , Rfl:  .  PROAIR HFA 108 (90 Base) MCG/ACT inhaler, Inhale 2 puffs by mouth every 6 hours as needed for wheezing or shortness of breath., Disp: 8.5 each, Rfl: 11 .  rizatriptan (MAXALT) 10  MG tablet, Take 1 tablet by mouth at the onset of migraine, may repeat in 2 hours. Max of 2 doses per day, Disp: 11 tablet, Rfl: 5 .  tiZANidine (ZANAFLEX) 4 MG tablet, Take 1/2 to 1 tablet by mouth at bedtime for neck pain, Disp: 30 tablet, Rfl: 5 .  traMADol (ULTRAM) 50 MG tablet, Take 1 tablet by mouth every 6 hours as needed, Disp: 15 tablet, Rfl: 2  EXAM: BP Readings from Last 3 Encounters:  07/04/18 138/80  03/10/18 96/84  01/11/18 112/68    VITALS per patient if applicable:  GENERAL: alert, oriented, appears well and in no acute distress  HEENT: atraumatic, conjunttiva clear, no obvious abnormalities on inspection of external nose and ears   LUNGS: on inspection no signs of respiratory distress, breathing rate appears normal, no obvious gross SOB, gasping or wheezing  CV: no obvious cyanosis Skin 2 red spots left breast area    No  bullseye.  alos area on back that I cannot delinieate ? Dark spot cannot tell if nole or other  MS: moves all visible extremities without noticeable abnormality  PSYCH/NEURO: pleasant and cooperative, no obvious depression or anxiety, speech and thought processing grossly intact   ASSESSMENT AND PLAN:  Discussed the following assessment and plan:  Tick bite, initial encounter  new bites  - new  bites   recureviewed  risk benefit will go ahead and  give doxy prophylaxis counselded about prevention Get somone to check area on back  Disc   And reviewed prevention and checking meticulaously  Every day  And low risk fo  Short term exposures  And fu with any systemic or alarm sx  Will go ahead and do tick bite prophylaxis  Today  Counseled.   Expectant management and discussion of plan and treatment with patient with opportunity to ask questions and all were answered. The patient agreed with the plan and demonstrated an understanding of the instructions.  Total visit 11mins > 50% spent counseling and coordinating care as indicated in above note and in instructions to patient .   The patient was advised to call back or seek an in-person evaluation if alarm features or as discussed or having concerns .   Shanon Ace, MD

## 2019-02-02 ENCOUNTER — Telehealth: Payer: Self-pay | Admitting: Neurology

## 2019-02-02 NOTE — Telephone Encounter (Signed)
Patient left VM about having headaches every day with the rain. Please call her back she is wanting to know about medications to take. Thanks!

## 2019-02-02 NOTE — Telephone Encounter (Signed)
Called and spoke with Pt. She has had an increase in headaches. She is interested in a preventative. I advised her she will need to discuss with Dr. Tomi Likens. She will be looking for a call from someone on Monday to schedule a virtual visit.

## 2019-02-08 ENCOUNTER — Encounter: Payer: Self-pay | Admitting: Neurology

## 2019-02-08 ENCOUNTER — Other Ambulatory Visit: Payer: Self-pay

## 2019-02-08 ENCOUNTER — Telehealth (INDEPENDENT_AMBULATORY_CARE_PROVIDER_SITE_OTHER): Payer: Medicare Other | Admitting: Neurology

## 2019-02-08 VITALS — Ht 65.0 in | Wt 173.0 lb

## 2019-02-08 DIAGNOSIS — G43809 Other migraine, not intractable, without status migrainosus: Secondary | ICD-10-CM

## 2019-02-08 MED ORDER — GABAPENTIN 100 MG PO CAPS: ORAL_CAPSULE | ORAL | 0 refills | Status: DC

## 2019-02-08 NOTE — Progress Notes (Signed)
Virtual Visit via Video Note The purpose of this virtual visit is to provide medical care while limiting exposure to the novel coronavirus.    Consent was obtained for video visit:  Yes.   Answered questions that patient had about telehealth interaction:  Yes.   I discussed the limitations, risks, security and privacy concerns of performing an evaluation and management service by telemedicine. I also discussed with the patient that there may be a patient responsible charge related to this service. The patient expressed understanding and agreed to proceed.  Pt location: Home Physician Location: office Name of referring provider:  Burnis Medin, MD I connected with Michele Monroe at patients initiation/request on 02/08/2019 at  3:10 PM EDT by video enabled telemedicine application and verified that I am speaking with the correct person using two identifiers. Pt MRN:  [id number] Pt DOB:  1946-05-09 Video Participants:  Michele Monroe   History of Present Illness:  Michele Monroe is a 73 year old right-handed female with hypertension, GERD, IBS, hyperlipidemia who follows up for cervicogenic migraines.    UPDATE: She started having increase in headaches 2 to 3 weeks ago.   Intensity: 3-10/10 Duration: 2 hours Frequency: Daily Abortive therapy:  Rizatriptan 10mg  for migraines, tramadol for abortive neck and shoulder pain. Muscle relaxant:  Tizanidine 4mg  at bedtime Other medications:  Atenolol 25mg  daily  HISTORY: Onset: 73 years old Location:  Bilateral occipital region Quality:  throbbing Initial intensity:  Starts as 3/10 and progresses to 10/10 She has chronic neck pain that is worse with neck movement.  Migraines often preceded by flare up of neck and shoulder pain. Aura:  no Prodrome:  no Postdrome:  nausea Associated symptoms: Sometimes nausea and vomiting.  Photophobia and phonophobia.  No visual disturbance..  She has not had any new worse headache of her life, waking up from  sleep Initial Duration:  Several days without treatment (30 minutes with Maxalt) Initial Frequency:  2 to 3 days per week Initial Frequency of abortive medication: 2 days per week Triggers/exacerbating factors: Neck movement, change in barometric pressure Relieving factors: None Activity:  Aggravates.  Needs to lay down  Past NSAIDS:  Ibuprofen, Aleve, Excedrin Past analgesics:  Excedrin, Tylenol, tramadol Past abortive triptans:  no Past muscle relaxants:  Flexeril Past anti-emetic:  Yes but uncertain of type Past antihypertensive medications:  no Past antidepressant medications:  Unsure.  Not amitriptyline, nortriptyline or Effexor Past anticonvulsant medications:  topiramate (ineffective) Past vitamins/Herbal/Supplements:  no Other past therapies:  heat and ice, exercises, massage for neck pain  Family history of headache:  Daughter has migraines  Report of an old CT of the head from 06/24/05 (images not currently available for review) demonstrated "two areas of low density in the inferior left basal ganglia" felt to be more likely prominent perivascular spaces rather than old lacunar infarcts.  CT of cervical spine on the same day demonstrated multilevel degenerative changes.  MRI of cervical spine from 06/06/17 was personally reviewed and demonstrated multilevel spondylolisthesis with cervical facet arthropathy with severe neural foraminal stenosis at bilateral C6 and left C7.  She was evaluated by interventional pain management who told her nothing interventional was warranted as it was all arthritic changes.  She denies radicular pain down the arms but notes that she wakes up with bilateral hand numbness, which resolves when she shakes her hands out.  Past Medical History: Past Medical History:  Diagnosis Date  . ALLERGIC RHINITIS 06/30/2010   perennial  .  Allergy    seasonal  . Coughing up blood 08/13/2012   piece of tissue? will send to path poss from upper airway  sinusitis  Final path was "abscess"   . GERD 06/30/2010  . Headache(784.0) 06/30/2010   migraines  . Hepatic steatosis   . HYPERLIPIDEMIA 06/30/2010  . HYPERTENSION 06/30/2010  . IBS 06/30/2010  . Internal hemorrhoids   . Renal lesion   . Varicose veins   . VITAMIN D DEFICIENCY 06/30/2010    Medications: Outpatient Encounter Medications as of 02/08/2019  Medication Sig  . atenolol (TENORMIN) 25 MG tablet Take 1 tablet by mouth every day  . atorvastatin (LIPITOR) 20 MG tablet Take 1 tablet by mouth every day  . Multiple Vitamins-Minerals (MULTIVITAMIN ADULT PO) Take by mouth.  . rizatriptan (MAXALT) 10 MG tablet Take 1 tablet by mouth at the onset of migraine, may repeat in 2 hours. Max of 2 doses per day  . tiZANidine (ZANAFLEX) 4 MG tablet Take 1/2 to 1 tablet by mouth at bedtime for neck pain  . traMADol (ULTRAM) 50 MG tablet Take 1 tablet by mouth every 6 hours as needed  . [DISCONTINUED] dicyclomine (BENTYL) 20 MG tablet TAKE ONE TABLET BY MOUTH TWICE DAILY  . [DISCONTINUED] doxycycline (VIBRA-TABS) 100 MG tablet Take 2 po x 1  . [DISCONTINUED] PROAIR HFA 108 (90 Base) MCG/ACT inhaler Inhale 2 puffs by mouth every 6 hours as needed for wheezing or shortness of breath.  . [DISCONTINUED] omeprazole (PRILOSEC OTC) 20 MG tablet Take 20 mg by mouth daily.  . [DISCONTINUED] OVER THE COUNTER MEDICATION Reported on 12/01/2015   No facility-administered encounter medications on file as of 02/08/2019.     Allergies: No Known Allergies  Family History: Family History  Problem Relation Age of Onset  . Colon cancer Mother        age 4  . Breast cancer Mother   . Arthritis Brother   . Other Brother   . Breast cancer Cousin     Social History: Social History   Socioeconomic History  . Marital status: Widowed    Spouse name: Not on file  . Number of children: 2  . Years of education: Not on file  . Highest education level: Not on file  Occupational History  . 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 Financial resource strain: Not on file  . Food insecurity:    Worry: Not on file    Inability: Not on file  . Transportation needs:    Medical: Not on file    Non-medical: Not on file  Tobacco Use  . Smoking status: Former Smoker    Types: Cigarettes    Last attempt to quit: 09/06/1976    Years since quitting: 42.4  . Smokeless tobacco: Never Used  Substance and Sexual Activity  . Alcohol use: No    Alcohol/week: 0.0 standard drinks    Comment: rarely  . Drug use: No  . Sexual activity: Not on file  Lifestyle  . Physical activity:    Days per week: Not on file    Minutes per session: Not on file  . Stress: Not on file  Relationships  . Social connections:    Talks on phone: Not on file    Gets together: Not on file    Attends religious service: Not on file    Active member of club or organization: Not on file    Attends meetings of clubs or  organizations: Not on file    Relationship status: Not on file  . Intimate partner violence:    Fear of current or ex partner: Not on file    Emotionally abused: Not on file    Physically abused: Not on file    Forced sexual activity: Not on file  Other Topics Concern  . 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       Lives alone with her two small dogs in a one story home. 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     Observations/Objective:   Vitals:   02/08/19 1102  Weight: 173 lb (78.5 kg)  Height: 5\' 5"  (1.651 m)  no acute distress.  Alert and oriented.  Speech fluent and not dysarthric.  Language     Assessment and Plan:   Cervicogenic migraines  1.  For preventative management, start gabapentin 100mg  at bedtime, titrating to  300mg  twice daily  In 2 months, we can continue titrating to 600mg  twice daily if needed. 2.  For abortive therapy, tramadol for acute neck pain and rizatriptan for migraine 3. Tizanidine 4mg  at bedtime 4.  Limit use of pain relievers to no more than 2 days out of week to prevent risk of rebound or medication-overuse headache. 5.  Keep headache diary 6.  Exercise, hydration, caffeine cessation, sleep hygiene, monitor for and avoid triggers 7.  Consider:  magnesium citrate 400mg  daily, riboflavin 400mg  daily, and coenzyme Q10 100mg  three times daily 8. Always keep in mind that currently taking a hormone or birth control may be a possible trigger or aggravating factor for migraine. 9. Follow up in 4 months.   Follow Up Instructions:    -I discussed the assessment and treatment plan with the patient. The patient was provided an opportunity to ask questions and all were answered. The patient agreed with the plan and demonstrated an understanding of the instructions.   The patient was advised to call back or seek an in-person evaluation if the symptoms worsen or if the condition fails to improve as anticipated.  Dudley Major, DO

## 2019-02-13 ENCOUNTER — Telehealth: Payer: Self-pay | Admitting: *Deleted

## 2019-02-13 NOTE — Telephone Encounter (Signed)
Copied from [place] [id number]. Topic: General - Other >> Feb 13, 2019 11:14 AM Nils Flack wrote: Reason for CRM: stacey from [employer] - calling to check on fax that was sent yesterday. [phone] Please call if is not received.  Only needs to sign pgs 2,4,5 Not responsible for giving results to pt.

## 2019-02-14 NOTE — Telephone Encounter (Signed)
Michele Monroe is calling and wanting to know the status of previous message. Please advise

## 2019-02-15 NOTE — Telephone Encounter (Signed)
Form in red folder please advise

## 2019-02-16 NOTE — Telephone Encounter (Signed)
Patient called back and was informed of the message below.  She stated she did not request testing as she was told by the company below that she needed testing due to her age and to ignore this note.  Message sent to Dr Regis Bill as [name].

## 2019-02-16 NOTE — Telephone Encounter (Signed)
I don't order these  Genetic testing panels   If concern about risk and intervention needed   I refer to genetic counselor  For advice . What is the reason for the  Testing request.

## 2019-02-16 NOTE — Telephone Encounter (Signed)
Clinic RN attempted to contact patient. No answer. LVM for patient to return call.  °

## 2019-02-28 ENCOUNTER — Telehealth: Payer: Self-pay | Admitting: Internal Medicine

## 2019-02-28 NOTE — Telephone Encounter (Signed)
Pt would like to know if it is time for her to have a bone density and to let you know her IBS has flared up again and wanted to know if there is a pill that she can take to calm it down (she state that she had a accident at the store and was not able to keep it from coming out had to throw the undergarments out and she is also having uncontrollable gas and it is so strong it about knocks her out).  She state that she would like to have a call back to discuss this.  Pt has an CPE appointment in August but would like know something before then.

## 2019-03-01 NOTE — Telephone Encounter (Signed)
Video visit or we can get her to see GI

## 2019-03-01 NOTE — Telephone Encounter (Signed)
Pt has been scheduled for tomorrow. °

## 2019-03-01 NOTE — Progress Notes (Signed)
Virtual Visit via Video Note  I connected with@ on 03/02/19 at  8:45 AM EDT by a video enabled telemedicine application and verified that I am speaking with the correct person using two identifiers. Location patient: home Location provider:work office Persons participating in the virtual visit: patient, provider  [place] national recommendations  regarding COVID 19 pandemic   video visit is advised over in office visit for this patient.  Patient aware  of the limitations of evaluation and management by telemedicine and  availability of in person appointments. and agreed to proceed.   HPI: Michele Monroe presents for video visit  Has had  Some  Mild gi sx off and on and then June 11 in store and had sudden   urgency ahs stool and loss of bowel control without fever vomiting .  Then happened again 2 other times    So  Afraid to go out at times.   also on gabapentin now for migraine with help but did have a sever HA with nausea and vomiting  Getting better .  Uses lactaid type mild and avoids lactose    This is new  Problem  No fever myalgias  Cough uri sx   . No pain travel exposures noted   ROS: See pertinent positives and negatives per HPI. No cp sob som cramps no blood  No rx   Past Medical History:  Diagnosis Date  . ALLERGIC RHINITIS 06/30/2010   perennial  . Allergy    seasonal  . Coughing up blood 08/13/2012   piece of tissue? will send to path poss from upper airway sinusitis  Final path was "abscess"   . GERD 06/30/2010  . Headache(784.0) 06/30/2010   migraines  . Hepatic steatosis   . HYPERLIPIDEMIA 06/30/2010  . HYPERTENSION 06/30/2010  . IBS 06/30/2010  . Internal hemorrhoids   . Renal lesion   . Varicose veins   . VITAMIN D DEFICIENCY 06/30/2010    Past Surgical History:  Procedure Laterality Date  . BREAST SURGERY     needle bx left breast  . COLONOSCOPY    . feet     surgery  . ROTATOR CUFF REPAIR Right   . varicose veins      Family History  Problem  Relation Age of Onset  . Colon cancer Mother        age 67  . Breast cancer Mother   . Arthritis Brother   . Other Brother   . Breast cancer Cousin     Social History   Tobacco Use  . Smoking status: Former Smoker    Types: Cigarettes    Quit date: 09/06/1976    Years since quitting: 42.5  . Smokeless tobacco: Never Used  Substance Use Topics  . Alcohol use: No    Alcohol/week: 0.0 standard drinks    Comment: rarely  . Drug use: No      Current Outpatient Medications:  .  atenolol (TENORMIN) 25 MG tablet, Take 1 tablet by mouth every day, Disp: 90 tablet, Rfl: 2 .  atorvastatin (LIPITOR) 20 MG tablet, Take 1 tablet by mouth every day, Disp: 30 tablet, Rfl: 11 .  gabapentin (NEURONTIN) 100 MG capsule, Take 1 capsule at bedtime for 1 week, then 1 capsule twice daily for 1 week, then 2 capsules twice daily for 1 week, then 3 capsules twice daily, Disp: 180 capsule, Rfl: 0 .  Multiple Vitamins-Minerals (MULTIVITAMIN ADULT PO), Take by mouth., Disp: , Rfl:  .  rizatriptan (MAXALT) 10  MG tablet, Take 1 tablet by mouth at the onset of migraine, may repeat in 2 hours. Max of 2 doses per day, Disp: 11 tablet, Rfl: 5 .  tiZANidine (ZANAFLEX) 4 MG tablet, Take 1/2 to 1 tablet by mouth at bedtime for neck pain, Disp: 30 tablet, Rfl: 5 .  traMADol (ULTRAM) 50 MG tablet, Take 1 tablet by mouth every 6 hours as needed, Disp: 15 tablet, Rfl: 2  EXAM: BP Readings from Last 3 Encounters:  07/04/18 138/80  03/10/18 96/84  01/11/18 112/68    VITALS per patient if applicable: GENERAL: alert, oriented, appears well and in no acute distress HEENT: atraumatic, conjunttiva clear, no obvious abnormalities on inspection of external nose and ears NECK: normal movements of the head and neck LUNGS: on inspection no signs of respiratory distress, breathing rate appears normal, no obvious gross SOB, gasping or wheezing CV: no obvious cyanosis PSYCH/NEURO: pleasant and cooperative, no obvious depression  or anxiety, speech and thought processing grossly intact Lab Results  Component Value Date   WBC 9.0 08/08/2017   HGB 13.1 08/08/2017   HCT 40.1 08/08/2017   PLT 253.0 08/08/2017   GLUCOSE 91 08/08/2017   CHOL 176 08/08/2017   TRIG 115.0 08/08/2017   HDL 58.40 08/08/2017   LDLCALC 94 08/08/2017   ALT 14 08/08/2017   AST 19 08/08/2017   NA 138 08/08/2017   K 3.9 08/08/2017   CL 102 08/08/2017   CREATININE 0.88 08/08/2017   BUN 15 08/08/2017   CO2 28 08/08/2017   TSH 1.33 06/15/2016    ASSESSMENT AND PLAN:  Discussed the following assessment and plan:    ICD-10-CM   1. Incontinence of feces, unspecified fecal incontinence type  R15.9 Ambulatory referral to Gastroenterology   ocass   2. Loose stools  R19.5 Ambulatory referral to Gastroenterology  3. Change in bowel habit  R19.4 Ambulatory referral to Gastroenterology  4. Estrogen deficiency  E28.39 DG Bone Density   Not t ypical of acute infection  But change in bowel habits   Use imodium on days she has to go out  For now  Risk benefit of medication discussed.   Plan Gi referral for evauation  Avoid fatty foods  And already  For mild products     Asks about getting updated dexa ;ast 10 uears via dr Sabra Heck   Can tale vit d when better  1000 iu per day  Can disc at upcoming  CPX.  Counseled.   Expectant management and discussion of plan and treatment with opportunity to ask questions and all were answered. The patient agreed with the plan and demonstrated an understanding of the instructions.   Advised to call back or seek an in-person evaluation if worsening  or having  further concerns .  Shanon Ace, MD

## 2019-03-02 ENCOUNTER — Other Ambulatory Visit: Payer: Self-pay

## 2019-03-02 ENCOUNTER — Ambulatory Visit (INDEPENDENT_AMBULATORY_CARE_PROVIDER_SITE_OTHER): Payer: Medicare Other | Admitting: Internal Medicine

## 2019-03-02 ENCOUNTER — Encounter: Payer: Self-pay | Admitting: Internal Medicine

## 2019-03-02 DIAGNOSIS — E2839 Other primary ovarian failure: Secondary | ICD-10-CM

## 2019-03-02 DIAGNOSIS — R195 Other fecal abnormalities: Secondary | ICD-10-CM

## 2019-03-02 DIAGNOSIS — R159 Full incontinence of feces: Secondary | ICD-10-CM

## 2019-03-02 DIAGNOSIS — R194 Change in bowel habit: Secondary | ICD-10-CM | POA: Diagnosis not present

## 2019-03-20 ENCOUNTER — Encounter: Payer: Medicare Other | Admitting: Internal Medicine

## 2019-03-26 ENCOUNTER — Other Ambulatory Visit: Payer: Self-pay | Admitting: Internal Medicine

## 2019-03-26 DIAGNOSIS — Z1231 Encounter for screening mammogram for malignant neoplasm of breast: Secondary | ICD-10-CM

## 2019-04-10 ENCOUNTER — Telehealth: Payer: Self-pay | Admitting: Internal Medicine

## 2019-04-10 NOTE — Telephone Encounter (Signed)
Copied from [place] [id number]. Topic: General - Other >> Apr 10, 2019  1:10 PM Keene Breath wrote: Reason for CRM: Patient called to inform the doctor that the medication she is taking is making her feel dizzy and not herself.  She stated that she sent two My Chart messages but has not heard from the doctor.  Please advise and call patient back at [phone]

## 2019-04-10 NOTE — Progress Notes (Signed)
Virtual Visit via Video Note  I connected with@ on 04/11/19 at  9:30 AM EDT by a video enabled telemedicine application and verified that I am speaking with the correct person using two identifiers. Location patient: home Location provider:work  office Persons participating in the virtual visit: patient, provider  [place]th national recommendations  regarding COVID 19 pandemic   video visit is advised over in office visit for this patient.  Patient aware  of the limitations of evaluation and management by telemedicine and  availability of in person appointments. and agreed to proceed.   HPI: Michele Monroe presents for video visit  Fu from concern about time of feeling drunk without other neuro sx.  Has been under care for headaches  And on gabapentin 100 bid for about a month and taking tizanidine 4 mg at night for neck and headache rx .  But on 8 3 after taking gaba pentin at 830 iun am felt like she was drunk and hard to have balance but no fever vision changes    She  Didn't take gaba this am but took last night   Balance seems better but has a top of head headache this am ( reports had been better for a while) she  takes daily antihistamine  And does have some pnd but no sig cough fever chills.   Taking atenolol daily  Not know what bp and pulse is but no resp syncope sx .    Doesn't eat much until 11 am and wonders if could berelated  To taking med  Without food for a while.  Not taking tramadol recently rare [place] no need at this time .   Has fu dr Loretta Plume in October .     ROS: See pertinent positives and negatives per HPI. No weakness falling vision changes focal neuro sx   Past Medical History:  Diagnosis Date  . ALLERGIC RHINITIS 06/30/2010   perennial  . Allergy    seasonal  . Coughing up blood 08/13/2012   piece of tissue? will send to path poss from upper airway sinusitis  Final path was "abscess"   . GERD 06/30/2010  . Headache(784.0) 06/30/2010   migraines  . Hepatic  steatosis   . HYPERLIPIDEMIA 06/30/2010  . HYPERTENSION 06/30/2010  . IBS 06/30/2010  . Internal hemorrhoids   . Renal lesion   . Varicose veins   . VITAMIN D DEFICIENCY 06/30/2010    Past Surgical History:  Procedure Laterality Date  . BREAST SURGERY     needle bx left breast  . COLONOSCOPY    . feet     surgery  . ROTATOR CUFF REPAIR Right   . varicose veins      Family History  Problem Relation Age of Onset  . Colon cancer Mother        age 67  . Breast cancer Mother   . Arthritis Brother   . Other Brother   . Breast cancer Cousin     Social History   Tobacco Use  . Smoking status: Former Smoker    Types: Cigarettes    Quit date: 09/06/1976    Years since quitting: 42.6  . Smokeless tobacco: Never Used  Substance Use Topics  . Alcohol use: No    Alcohol/week: 0.0 standard drinks    Comment: rarely  . Drug use: No      Current Outpatient Medications:  .  atenolol (TENORMIN) 25 MG tablet, Take 1 tablet by mouth every day, Disp: 90 tablet,  Rfl: 2 .  atorvastatin (LIPITOR) 20 MG tablet, Take 1 tablet by mouth every day, Disp: 30 tablet, Rfl: 11 .  gabapentin (NEURONTIN) 100 MG capsule, Take 1 capsule at bedtime for 1 week, then 1 capsule twice daily for 1 week, then 2 capsules twice daily for 1 week, then 3 capsules twice daily, Disp: 180 capsule, Rfl: 0 .  Multiple Vitamins-Minerals (MULTIVITAMIN ADULT PO), Take by mouth., Disp: , Rfl:  .  rizatriptan (MAXALT) 10 MG tablet, Take 1 tablet by mouth at the onset of migraine, may repeat in 2 hours. Max of 2 doses per day, Disp: 11 tablet, Rfl: 5 .  tiZANidine (ZANAFLEX) 4 MG tablet, Take 1/2 to 1 tablet by mouth at bedtime for neck pain, Disp: 30 tablet, Rfl: 5 .  traMADol (ULTRAM) 50 MG tablet, Take 1 tablet by mouth every 6 hours as needed, Disp: 15 tablet, Rfl: 2  EXAM: BP Readings from Last 3 Encounters:  07/04/18 138/80  03/10/18 96/84  01/11/18 112/68    VITALS per patient if applicable: no toxic  nl  speech   GENERAL: alert, oriented, appears well and in no acute distress  HEENT: atraumatic, conjunttiva clear, no obvious abnormalities on inspection of external nose and ears  Appears congested but no resp distress NECK: normal movements of the head and neck LUNGS: on inspection no signs of respiratory distress, breathing rate appears normal, no obvious gross SOB, gasping or wheezing CV: no obvious cyanosis  PSYCH/NEURO: pleasant and cooperative, no obvious depression or anxiety, speech and thought processing grossly intact   ASSESSMENT AND PLAN:  Discussed the following assessment and plan:    ICD-10-CM   1. Dizziness  R42   2. Medication management  Z79.899   3. Nonintractable headache, unspecified chronicity pattern, unspecified headache type  R51   4. Sinus congestion  R09.81    pretty chronic she doesnt think sinus infection at this time   Suspect poss se of meds  Albeit cannot say why now as has been on for about a month  And has helped her HAs   Perhaps related to her sinus congestion   No other alarm findings  Suggest only take evening gaba and then when feels better restart bid dosing  But can take at her am meal mid day   To see if mitigates   If ongoing then would have her get back with Dr Tomi Likens who is managing her medication and headache condition.  Counseled.   Expectant management and discussion of plan and treatment with opportunity to ask questions and all were answered. The patient agreed with the plan and demonstrated an understanding of the instructions.   Advised to call back or seek an in-person evaluation if worsening  or having  further concerns . Marland Kitchen   Shanon Ace, MD

## 2019-04-10 NOTE — Telephone Encounter (Signed)
ok 

## 2019-04-10 NOTE — Telephone Encounter (Signed)
See  Other message   Poss form med VV tomorrow as is better now

## 2019-04-10 NOTE — Telephone Encounter (Signed)
dont have enough information   Could be that your medications together are causing side effects  Please do a medical triage of call  For urgency    hhave her get  Updated med list and which meds taken and what her bp and other sx are .   IF not urgent  ( ie need to seek emergent evaluation)  Can set her up for a video viist

## 2019-04-10 NOTE — Telephone Encounter (Signed)
Pt has been called back

## 2019-04-11 ENCOUNTER — Other Ambulatory Visit: Payer: Self-pay | Admitting: Neurology

## 2019-04-11 ENCOUNTER — Telehealth (INDEPENDENT_AMBULATORY_CARE_PROVIDER_SITE_OTHER): Payer: Medicare Other | Admitting: Internal Medicine

## 2019-04-11 ENCOUNTER — Other Ambulatory Visit: Payer: Self-pay

## 2019-04-11 ENCOUNTER — Encounter: Payer: Self-pay | Admitting: Internal Medicine

## 2019-04-11 DIAGNOSIS — R0981 Nasal congestion: Secondary | ICD-10-CM

## 2019-04-11 DIAGNOSIS — R51 Headache: Secondary | ICD-10-CM

## 2019-04-11 DIAGNOSIS — Z79899 Other long term (current) drug therapy: Secondary | ICD-10-CM

## 2019-04-11 DIAGNOSIS — R42 Dizziness and giddiness: Secondary | ICD-10-CM | POA: Diagnosis not present

## 2019-04-11 DIAGNOSIS — R519 Headache, unspecified: Secondary | ICD-10-CM

## 2019-04-13 ENCOUNTER — Other Ambulatory Visit: Payer: Self-pay | Admitting: Neurology

## 2019-04-24 ENCOUNTER — Encounter: Payer: Self-pay | Admitting: Gastroenterology

## 2019-04-24 ENCOUNTER — Other Ambulatory Visit: Payer: Self-pay

## 2019-04-24 ENCOUNTER — Ambulatory Visit (INDEPENDENT_AMBULATORY_CARE_PROVIDER_SITE_OTHER): Payer: Medicare Other | Admitting: Gastroenterology

## 2019-04-24 VITALS — BP 110/70 | HR 64 | Temp 98.2°F | Ht 66.0 in | Wt 184.8 lb

## 2019-04-24 DIAGNOSIS — R1031 Right lower quadrant pain: Secondary | ICD-10-CM | POA: Diagnosis not present

## 2019-04-24 DIAGNOSIS — K580 Irritable bowel syndrome with diarrhea: Secondary | ICD-10-CM

## 2019-04-24 DIAGNOSIS — R1032 Left lower quadrant pain: Secondary | ICD-10-CM

## 2019-04-24 MED ORDER — DICYCLOMINE HCL 20 MG PO TABS: 20.0000 mg | ORAL_TABLET | Freq: Every day | ORAL | 2 refills | Status: DC

## 2019-04-24 NOTE — Progress Notes (Signed)
Michele Monroe    [id number]    1946-07-27  Primary Care Physician:Panosh, [name], MD  Referring Physician: Burnis Medin, MD [place],  [place] [address]   Chief complaint:  IBS  HPI: 73 year old female with history of chronic irritable bowel syndrome here for follow-up visit  She is doing well overall.  Has intermittent diarrhea occasionally, denies any fecal incontinence, rectal bleeding or melena.  She feels diarrhea is worse sometimes based on what she eats.  Denies any dysphagia, vomiting or abdominal pain.  No loss of appetite or weight loss.  Colonoscopy 07/10/2015 Cecal polyp removed, hemorrhoids otherwise normal exam    Outpatient Encounter Medications as of 04/24/2019  Medication Sig  . atenolol (TENORMIN) 25 MG tablet Take 1 tablet by mouth every day  . atorvastatin (LIPITOR) 20 MG tablet Take 1 tablet by mouth every day  . gabapentin (NEURONTIN) 100 MG capsule Take 1 capsule at bedtime for 1 week, then 1 capsule twice daily for 1 week, then 2 capsules twice daily for 1 week, then 3 capsules twice daily  . Multiple Vitamins-Minerals (MULTIVITAMIN ADULT PO) Take by mouth.  . rizatriptan (MAXALT) 10 MG tablet Take 1 tablet by mouth at the onset of migraine, may repeat in 2 hours. Max of 2 doses per day  . tiZANidine (ZANAFLEX) 4 MG tablet Take 1/2 to 1 tablet by mouth at bedtime for neck pain  . traMADol (ULTRAM) 50 MG tablet Take 1 tablet by mouth every 6 hours as needed   No facility-administered encounter medications on file as of 04/24/2019.     Allergies as of 04/24/2019  . (No Known Allergies)    Past Medical History:  Diagnosis Date  . ALLERGIC RHINITIS 06/30/2010   perennial  . Allergy    seasonal  . Coughing up blood 08/13/2012   piece of tissue? will send to path poss from upper airway sinusitis  Final path was "abscess"   . GERD 06/30/2010  . Headache(784.0) 06/30/2010   migraines  . Hepatic steatosis   .  HYPERLIPIDEMIA 06/30/2010  . HYPERTENSION 06/30/2010  . IBS 06/30/2010  . Internal hemorrhoids   . Renal lesion   . Varicose veins   . VITAMIN D DEFICIENCY 06/30/2010    Past Surgical History:  Procedure Laterality Date  . BREAST SURGERY     needle bx left breast  . COLONOSCOPY    . feet     surgery  . ROTATOR CUFF REPAIR Right   . varicose veins      Family History  Problem Relation Age of Onset  . Colon cancer Mother        age 75  . Breast cancer Mother   . Arthritis Brother   . Other Brother   . Breast cancer Cousin     Social History   Socioeconomic History  . Marital status: Widowed    Spouse name: Not on file  . Number of children: 2  . Years of education: Not on file  . Highest education level: Not on file  Occupational History  . 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 Financial resource strain: Not on file  . Food insecurity    Worry: Not on file    Inability: Not on file  . Transportation needs    Medical: Not on file    Non-medical: Not on file  Tobacco Use  . Smoking  status: Former Smoker    Types: Cigarettes    Quit date: 09/06/1976    Years since quitting: 42.6  . Smokeless tobacco: Never Used  Substance and Sexual Activity  . Alcohol use: No    Alcohol/week: 0.0 standard drinks    Comment: rarely  . Drug use: No  . Sexual activity: Not on file  Lifestyle  . Physical activity    Days per week: Not on file    Minutes per session: Not on file  . Stress: Not on file  Relationships  . Social Herbalist on phone: Not on file    Gets together: Not on file    Attends religious service: Not on file    Active member of club or organization: Not on file    Attends meetings of clubs or organizations: Not on file    Relationship status: Not on file  . Intimate partner violence    Fear of current or ex partner: Not on file    Emotionally abused: Not on file    Physically abused: Not on file     Forced sexual activity: Not on file  Other Topics Concern  . 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       Lives alone with her two small dogs in a one story home. 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       Review of systems: Review of Systems  Constitutional: Negative for fever and chills.  HENT: Negative.   Eyes: Negative for blurred vision.  Respiratory: Negative for cough, shortness of breath and wheezing.   Cardiovascular: Negative for chest pain and palpitations.  Gastrointestinal: as per HPI Genitourinary: Negative for dysuria, urgency, frequency and hematuria.  Musculoskeletal: Negative for myalgias, back pain and joint pain.  Skin: Negative for itching and rash.  Neurological: Negative for dizziness, tremors, focal weakness, seizures and loss of consciousness.  Endo/Heme/Allergies: Positive for seasonal allergies.  Psychiatric/Behavioral: Negative for depression, suicidal ideas and hallucinations.  All other systems reviewed and are negative.   Physical Exam: Vitals:   04/24/19 1037  BP: 110/70  Pulse: 64  Temp: 98.2 F (36.8 C)   Body mass index is 29.83 kg/m. Gen:      No acute distress HEENT:  EOMI, sclera anicteric Neck:     No masses; no thyromegaly Lungs:    Clear to auscultation bilaterally; normal respiratory effort CV:         Regular rate and rhythm; no murmurs Abd:      + bowel sounds; soft, non-tender; no palpable masses, no distension Ext:    No edema; adequate peripheral perfusion Skin:      Warm and dry; no rash Neuro: alert and oriented x 3 Psych: normal mood and affect  Data Reviewed:  Reviewed labs, radiology imaging, old records and pertinent past GI work up   Assessment and Plan/Recommendations:   73 year old female with chronic irritable bowel syndrome for follow-up visit  Intermittent diarrhea could be secondary to lactose intolerance Discussed lactose-free diet in detail  Start fiber supplement, fiber choice or Benefiber 1 tablespoon twice daily  Okay to use IBgard or dicyclomine up to 3 times  daily as needed for intermittent abdominal cramping and IBS symptoms  Return as needed  15 minutes was spent face-to-face with the patient. Greater than 50% of the time used for counseling as well as treatment plan and follow-up. She had multiple questions which were answered to her satisfaction  K. Denzil Magnuson , MD    CC: Panosh, [name], MD

## 2019-04-24 NOTE — Patient Instructions (Signed)
Use IBGard 1 capsule daily  Take Fiber Choice 1 tablet twice daily  We will send dicyclomine to your pharmacy  Follow up as needed   Lactose-Free Diet, Adult If you have lactose intolerance, you are not able to digest lactose. Lactose is a natural sugar found mainly in dairy milk and dairy products. You may need to avoid all foods and beverages that contain lactose. A lactose-free diet can help you do this. Which foods have lactose? Lactose is found in dairy milk and dairy products, such as:  Yogurt.  Cheese.  Butter.  Margarine.  Sour cream.  Cream.  Whipped toppings and nondairy creamers.  Ice cream and other dairy-based desserts. Lactose is also found in foods or products made with dairy milk or milk ingredients. To find out whether a food contains dairy milk or a milk ingredient, look at the ingredients list. Avoid foods with the statement "May contain milk" and foods that contain:  Milk powder.  Whey.  Curd.  Caseinate.  Lactose.  Lactalbumin.  Lactoglobulin. What are alternatives to dairy milk and foods made with milk products?  Lactose-free milk.  Soy milk with added calcium and vitamin D.  Almond milk, coconut milk, rice milk, or other nondairy milk alternatives with added calcium and vitamin D. Note that these are low in protein.  Soy products, such as soy yogurt, soy cheese, soy ice cream, and soy-based sour cream.  Other nut milk products, such as almond yogurt, almond cheese, cashew yogurt, cashew cheese, cashew ice cream, coconut yogurt, and coconut ice cream. What are tips for following this plan?  Do not consume foods, beverages, vitamins, minerals, or medicines containing lactose. Read ingredient lists carefully.  Look for the words "lactose-free" on labels.  Use lactase enzyme drops or tablets as directed by your health care provider.  Use lactose-free milk or a milk alternative, such as soy milk or almond milk, for drinking and cooking.   Make sure you get enough calcium and vitamin D in your diet. A lactose-free eating plan can be lacking in these important nutrients.  Take calcium and vitamin D supplements as directed by your health care provider. Talk to your health care provider about supplements if you are not able to get enough calcium and vitamin D from food. What foods can I eat?  Fruits All fresh, canned, frozen, or dried fruits that are not processed with lactose. Vegetables All fresh, frozen, and canned vegetables without cheese, cream, or butter sauces. Grains Any that are not made with dairy milk or dairy products. Meats and other proteins Any meat, fish, poultry, and other protein sources that are not made with dairy milk or dairy products. Soy cheese and yogurt. Fats and oils Any that are not made with dairy milk or dairy products. Beverages Lactose-free milk. Soy, rice, or almond milk with added calcium and vitamin D. Fruit and vegetable juices. Sweets and desserts Any that are not made with dairy milk or dairy products. Seasonings and condiments Any that are not made with dairy milk or dairy products. Calcium Calcium is found in many foods that contain lactose and is important for bone health. The amount of calcium you need depends on your age:  Adults younger than 50 years: 1,000 mg of calcium a day.  Adults older than 50 years: 1,200 mg of calcium a day. If you are not getting enough calcium, you may get it from other sources, including:  Orange juice with calcium added. There are 300-350 mg of calcium in  1 cup of orange juice.  Calcium-fortified soy milk. There are 300-400 mg of calcium in 1 cup of calcium-fortified soy milk.  Calcium-fortified rice or almond milk. There are 300 mg of calcium in 1 cup of calcium-fortified rice or almond milk.  Calcium-fortified breakfast cereals. There are 100-1,000 mg of calcium in calcium-fortified breakfast cereals.  Spinach, cooked. There are 145 mg of  calcium in  cup of cooked spinach.  Edamame, cooked. There are 130 mg of calcium in  cup of cooked edamame.  Collard greens, cooked. There are 125 mg of calcium in  cup of cooked collard greens.  Kale, frozen or cooked. There are 90 mg of calcium in  cup of cooked or frozen kale.  Almonds. There are 95 mg of calcium in  cup of almonds.  Broccoli, cooked. There are 60 mg of calcium in 1 cup of cooked broccoli. The items listed above may not be a complete list of recommended foods and beverages. Contact a dietitian for more options. What foods are not recommended? Fruits None, unless they are made with dairy milk or dairy products. Vegetables None, unless they are made with dairy milk or dairy products. Grains Any grains that are made with dairy milk or dairy products. Meats and other proteins None, unless they are made with dairy milk or dairy products. Dairy All dairy products, including milk, goat's milk, buttermilk, kefir, acidophilus milk, flavored milk, evaporated milk, condensed milk, dulce de [place], eggnog, yogurt, cheese, and cheese spreads. Fats and oils Any that are made with milk or milk products. Margarines and salad dressings that contain milk or cheese. Cream. Half and half. Cream cheese. Sour cream. Chip dips made with sour cream or yogurt. Beverages Hot chocolate. Cocoa with lactose. Instant iced teas. Powdered fruit drinks. Smoothies made with dairy milk or yogurt. Sweets and desserts Any that are made with milk or milk products. Seasonings and condiments Chewing gum that has lactose. Spice blends if they contain lactose. Artificial sweeteners that contain lactose. Nondairy creamers. The items listed above may not be a complete list of foods and beverages to avoid. Contact a dietitian for more information. Summary  If you are lactose intolerant, it means that you have a hard time digesting lactose, a natural sugar found in milk and milk products.  Following a  lactose-free diet can help you manage this condition.  Calcium is important for bone health and is found in many foods that contain lactose. Talk with your health care provider about other sources of calcium. This information is not intended to replace advice given to you by your health care provider. Make sure you discuss any questions you have with your health care provider. Document Released: 02/12/2002 Document Revised: 09/20/2017 Document Reviewed: 09/20/2017 Elsevier Patient Education  [place].  I appreciate the  opportunity to care for you  Thank You   Harl Bowie , MD

## 2019-05-01 NOTE — Progress Notes (Signed)
Chief Complaint  Patient presents with  . Annual Exam    pt states that omperazole makes her dizzy     HPI: Michele Monroe 72 y.o. comes in today for Preventive Medicare exam/ wellness visit . Taking gaba   At night  And sx better  Bentyl  For obs makes woozy not sure if helps  Sees dr   Michele Monroe  Taking gaba  And doing better  Had injury to left ring finger in Feb march when  Offices closed and had swelling leftt ring finger and had to have ring cut off   Now cannot fully extend the left ring  prox joint  Grip ok no popping no current soreness   She is right handed   Health Maintenance  Topic Date Due  . TETANUS/TDAP  09/06/2017  . INFLUENZA VACCINE  04/07/2019  . MAMMOGRAM  02/24/2020  . COLONOSCOPY  07/09/2020  . DEXA SCAN  Completed  . Hepatitis C Screening  Completed  . PNA vac Low Risk Adult  Completed   Health Maintenance Review LIFESTYLE:  Exercise:     Walk the dogs  Tobacco/ETS:  no Alcohol:  No sig  Sugar beverages: pink lomeondate   Coke  2 per week  Sleep:   Over 8 hours  Drug use: no HH: 1  Around family     Hearing:  ok  Vision:  No limitations at present . Last eye check UTD  Safety:  Has smoke detector and wears seat belts.   No excess sun exposure. Sees dentist regularly.  Falls: n  Memory: Felt to be good  , no concern from her or her family.  Depression: No anhedonia unusual crying or depressive symptoms  Nutrition: Eats well balanced diet; adequate calcium and vitamin D. No swallowing chewing problems.  Other healthcare providers:  Reviewed today .  Preventive parameters: up-to-date  Reviewed   ADLS:   There are no problems or need for assistance  driving, feeding, obtaining food, dressing, toileting and bathing, managing money using phone. She is independent.   ROS:  Sinus stabel  VV stable not wearing compression  GEN/ HEENT: No fever, significant weight changes sweats headaches vision problems hearing changes, CV/ PULM; No chest  pain shortness of breath cough, syncope,edema  change in exercise tolerance. GI /GU: No adominal pain, vomiting, change in bowel habits. No blood in the stool. No significant GU symptoms. SKIN/HEME: ,no acute skin rashes suspicious lesions or bleeding. No lymphadenopathy, nodules, masses.  NEURO/ PSYCH:  No neurologic signs such as weakness numbness. No depression anxiety. IMM/ Allergy: No unusual infections.  Allergy .   REST of 12 system review negative except as per HPI   Past Medical History:  Diagnosis Date  . ALLERGIC RHINITIS 06/30/2010   perennial  . Allergy    seasonal  . Coughing up blood 08/13/2012   piece of tissue? will send to path poss from upper airway sinusitis  Final path was "abscess"   . GERD 06/30/2010  . Headache(784.0) 06/30/2010   migraines  . Hepatic steatosis   . HYPERLIPIDEMIA 06/30/2010  . HYPERTENSION 06/30/2010  . IBS 06/30/2010  . Internal hemorrhoids   . Renal lesion   . Varicose veins   . VITAMIN D DEFICIENCY 06/30/2010    Family History  Problem Relation Age of Onset  . Colon cancer Mother        age 100  . Breast cancer Mother   . Arthritis Brother   . Other Brother   .  Breast cancer Cousin     Social History   Socioeconomic History  . Marital status: Widowed    Spouse name: Not on file  . Number of children: 2  . Years of education: Not on file  . Highest education level: Not on file  Occupational History  . 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 Financial resource strain: Not on file  . Food insecurity    Worry: Not on file    Inability: Not on file  . Transportation needs    Medical: Not on file    Non-medical: Not on file  Tobacco Use  . Smoking status: Former Smoker    Types: Cigarettes    Quit date: 09/06/1976    Years since quitting: 42.6  . Smokeless tobacco: Never Used  Substance and Sexual Activity  . Alcohol use: No    Alcohol/week: 0.0 standard drinks    Comment: rarely   . Drug use: No  . Sexual activity: Not on file  Lifestyle  . Physical activity    Days per week: Not on file    Minutes per session: Not on file  . Stress: Not on file  Relationships  . Social Herbalist on phone: Not on file    Gets together: Not on file    Attends religious service: Not on file    Active member of club or organization: Not on file    Attends meetings of clubs or organizations: Not on file    Relationship status: Not on file  Other Topics Concern  . 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       Lives alone with her two small dogs in a one story home. 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     Outpatient Encounter Medications as of 05/02/2019  Medication Sig  . atenolol (TENORMIN) 25 MG tablet Take 1 tablet by mouth every day  . atorvastatin (LIPITOR) 20 MG tablet Take 1 tablet by mouth every day  . dicyclomine (BENTYL) 20 MG tablet Take 1 tablet (20 mg total) by mouth daily. As needed  . gabapentin (NEURONTIN) 100 MG capsule TAKE 1 CAPSULE AT BEDTIME FOR 1 WEEK, THEN 1 CAPSULE TWICE DAILY FOR 1 WEEK, THEN 2 CAPSULES TWICE DAILY FOR 1 WEEK, THEN 3 CAPSULES TWICE DAILY  . Multiple Vitamins-Minerals (MULTIVITAMIN ADULT PO) Take by mouth.  Marland Kitchen omeprazole (PRILOSEC) 20 MG capsule Take 20 mg by mouth daily.  . rizatriptan (MAXALT) 10 MG tablet Take 1 tablet by mouth at the onset of migraine, may repeat in 2 hours. Max of 2 doses per day  . tiZANidine (ZANAFLEX) 4 MG tablet Take 1/2 to 1 tablet by mouth at bedtime for neck pain  . traMADol (ULTRAM) 50 MG tablet Take 1 tablet by mouth every 6 hours as needed  . [DISCONTINUED] gabapentin (NEURONTIN) 100 MG capsule Take 1 capsule at bedtime for 1 week, then 1 capsule twice daily for 1  week, then 2 capsules twice daily for 1 week, then 3 capsules twice daily   No facility-administered encounter medications on file as of 05/02/2019.     EXAM:  BP 122/70 (BP Location: Right Arm, Patient Position: Sitting, Cuff Size: Normal)   Pulse 67   Temp 98.2 F (36.8 C) (Temporal)   Ht 5\' 4"  (1.626 m)   Wt 184 lb 9.6 oz (83.7 kg)   SpO2 96%   BMI 31.69 kg/m   Body mass index is 31.69 kg/m.  Physical Exam: Vital signs reviewed [id number] is a well-developed well-nourished alert cooperative   who appears stated age in no acute distress.  HEENT: normocephalic atraumatic , Eyes: PERRL EOM's full, conjunctiva clear, Nares: paten,t no deformity discharge or tenderness., Ears: no deformity EAC's clear TMs with normal landmarks. Mouth:masked  NECK: supple without masses, thyromegaly or bruits. CHEST/PULM:  Clear to auscultation and percussion breath sounds equal no wheeze , rales or rhonchi. No chest wall deformities or tenderness. CV: PMI is nondisplaced, S1 S2 no gallops, murmurs, rubs. Peripheral pulses are ithout delay.No JVD .  Has vv  ABDOMEN: Bowel sounds normal nontender  No guard or rebound, no hepato splenomegal no CVA tenderness.   Extremtities:  No clubbing cyanosis or edema, no acute joint swelling or redness left ring finger with enlarged pip joint and flexion fixed ? 20 - 30 DEG cannot extend NEURO:  Oriented x3, cranial nerves 3-12 appear to be intact, no obvious focal weakness,gait within normal limits no abnormal reflexes or asymmetrical SKIN: No acute rashes normal turgor, color, no bruising or petechiae. PSYCH: Oriented, good eye contact, no obvious depression anxiety, cognition and judgment appear normal. LN: no cervical axillary inguinal adenopathy No noted deficits in memory, attention, and speech.   Lab Results  Component Value Date   WBC 9.0 08/08/2017   HGB 13.1 08/08/2017   HCT 40.1 08/08/2017   PLT 253.0 08/08/2017   GLUCOSE 91 08/08/2017   CHOL 176  08/08/2017   TRIG 115.0 08/08/2017   HDL 58.40 08/08/2017   LDLCALC 94 08/08/2017   ALT 14 08/08/2017   AST 19 08/08/2017   NA 138 08/08/2017   K 3.9 08/08/2017   CL 102 08/08/2017   CREATININE 0.88 08/08/2017   BUN 15 08/08/2017   CO2 28 08/08/2017   TSH 1.33 06/15/2016    ASSESSMENT AND PLAN:  Discussed the following assessment and plan:  Visit for preventive health examination  Medication management - Plan: Basic metabolic panel, Lipid panel, CBC with Differential/Platelet, Hepatic function panel, TSH  Recurrent headache - Plan: Basic metabolic panel, Lipid panel, CBC with Differential/Platelet, Hepatic function panel, TSH  Need for influenza vaccination - Plan: Flu Vaccine QUAD High Dose(Fluad)  Finger deformity, acquired, left - Plan: Basic metabolic panel, Lipid panel, CBC with Differential/Platelet, Hepatic function panel, TSH  Hyperlipidemia, unspecified hyperlipidemia type - Plan: Basic metabolic panel, Lipid panel, CBC with Differential/Platelet, Hepatic function panel, TSH  Irritable bowel syndrome, unspecified type - Plan: Basic metabolic panel, Lipid panel, CBC with Differential/Platelet, Hepatic function panel, TSH patient will let me know if wants  [place] ot arrange hand  Opinion  If any  Non invasive intervention is  Possible  Or appropriate injury ius 5 months old  Patient Care Team: Burnis Medin, MD as PCP - Loman Brooklyn, Stephan Minister, DO as Consulting Physician (Neurology) Mauri Pole, MD as Consulting Physician (Gastroenterology)  Patient Instructions  Glad you are doing well.   Suggest  We get you to se a hand specialist about the left ring finger deformity after injury.  To get opinion if any intervention would be helpful .  Will notify you  of labs when available.  I agree staying away from crowds  Or gatherings  is the best way to avoid getting covid19  In addition to mask and hand washing      COVID-19: How to Protect Yourself and Others  Know how it spreads  There is currently no vaccine to prevent coronavirus disease 2019 (COVID-19).  The best way to prevent illness is to avoid being exposed to this virus.  The virus is thought to spread mainly from person-to-person. ? Between people who are in close contact with one another (within about 6 feet). ? Through respiratory droplets produced when an infected person coughs, sneezes or talks. ? These droplets can land in the mouths or noses of people who are nearby or possibly be inhaled into the lungs. ? Some recent studies have suggested that COVID-19 may be spread by people who are not showing symptoms. Everyone should Clean your hands often  Wash your hands often with soap and water for at least 20 seconds especially after you have been in a public place, or after blowing your nose, coughing, or sneezing.  If soap and water are not readily available, use a hand sanitizer that contains at least 60% alcohol. Cover all surfaces of your hands and rub them together until they feel dry.  Avoid touching your eyes, nose, and mouth with unwashed hands. Avoid close contact  Stay home if you are sick.  Avoid close contact with people who are sick.  Put distance between yourself and other people. ? Remember that some people without symptoms may be able to spread virus. ? This is especially important for people who are at higher risk of getting very [email or web address] Cover your mouth and nose with a cloth face cover when around others  You could spread COVID-19 to others even if you do not feel sick.  Everyone should wear a cloth face cover when they have to go out in public, for example to the grocery store or to pick up other necessities. ? Cloth face coverings should not be placed on young children under age 74, anyone who has trouble breathing, or is unconscious, incapacitated or otherwise unable to remove  the mask without assistance.  The cloth face cover is meant to protect other people in case you are infected.  Do NOT use a facemask meant for a Dietitian.  Continue to keep about 6 feet between yourself and others. The cloth face cover is not a substitute for social distancing. Cover coughs and sneezes  If you are in a private setting and do not have on your cloth face covering, remember to always cover your mouth and nose with a tissue when you cough or sneeze or use the inside of your elbow.  Throw used tissues in the trash.  Immediately wash your hands with soap and water for at least 20 seconds. If soap and water are not readily available, clean your hands with a hand sanitizer that contains at least 60% alcohol. Clean and disinfect  Clean AND disinfect frequently touched surfaces daily. This includes tables, doorknobs, light switches, countertops, handles, desks, phones, keyboards, toilets, faucets, and sinks. [employer]  If surfaces are dirty, clean them: Use detergent or soap and water prior to disinfection.  Then, use a household disinfectant. You can see a list of EPA-registered household disinfectants here. michellinders.com 01/09/2019 This information is not intended to replace advice given to you by your health care provider. Make sure you discuss any questions you have with your health care provider. Document Released:  12/19/2018 Document Revised: 01/17/2019 Document Reviewed: 12/19/2018 Elsevier Patient Education  [place] Maintenance, Female Adopting a healthy lifestyle and getting preventive care are important in promoting health and wellness. Ask your health care provider about:  The right schedule for you to have regular tests and exams.  Things you can do on your own to prevent diseases and keep yourself healthy. What should I know about diet, weight, and  exercise? Eat a healthy diet   Eat a diet that includes plenty of vegetables, fruits, low-fat dairy products, and lean protein.  Do not eat a lot of foods that are high in solid fats, added sugars, or sodium. Maintain a healthy weight Body mass index (BMI) is used to identify weight problems. It estimates body fat based on height and weight. Your health care provider can help determine your BMI and help you achieve or maintain a healthy weight. Get regular exercise Get regular exercise. This is one of the most important things you can do for your health. Most adults should:  Exercise for at least 150 minutes each week. The exercise should increase your heart rate and make you sweat (moderate-intensity exercise).  Do strengthening exercises at least twice a week. This is in addition to the moderate-intensity exercise.  Spend less time sitting. Even light physical activity can be beneficial. Watch cholesterol and blood lipids Have your blood tested for lipids and cholesterol at 72 years of age, then have this test every 5 years. Have your cholesterol levels checked more often if:  Your lipid or cholesterol levels are high.  You are older than 72 years of age.  You are at high risk for heart disease. What should I know about cancer screening? Depending on your health history and family history, you may need to have cancer screening at various ages. This may include screening for:  Breast cancer.  Cervical cancer.  Colorectal cancer.  Skin cancer.  Lung cancer. What should I know about heart disease, diabetes, and high blood pressure? Blood pressure and heart disease  High blood pressure causes heart disease and increases the risk of stroke. This is more likely to develop in people who have high blood pressure readings, are of African descent, or are overweight.  Have your blood pressure checked: ? Every 3-5 years if you are 6-56 years of age. ? Every year if you are 68  years old or older. Diabetes Have regular diabetes screenings. This checks your fasting blood sugar level. Have the screening done:  Once every three years after age 46 if you are at a normal weight and have a low risk for diabetes.  More often and at a younger age if you are overweight or have a high risk for diabetes. What should I know about preventing infection? Hepatitis B If you have a higher risk for hepatitis B, you should be screened for this virus. Talk with your health care provider to find out if you are at risk for hepatitis B infection. Hepatitis C Testing is recommended for:  Everyone born from [birth year] through 1964.  Anyone with known risk factors for hepatitis C. Sexually transmitted infections (STIs)  Get screened for STIs, including gonorrhea and chlamydia, if: ? You are sexually active and are younger than 72 years of age. ? You are older than 72 years of age and your health care provider tells you that you are at risk for this type of infection. ? Your sexual activity has changed since you were last  screened, and you are at increased risk for chlamydia or gonorrhea. Ask your health care provider if you are at risk.  Ask your health care provider about whether you are at high risk for HIV. Your health care provider may recommend a prescription medicine to help prevent HIV infection. If you choose to take medicine to prevent HIV, you should first get tested for HIV. You should then be tested every 3 months for as long as you are taking the medicine. Pregnancy  If you are about to stop having your period (premenopausal) and you may become pregnant, seek counseling before you get pregnant.  Take 400 to 800 micrograms (mcg) of folic acid every day if you become pregnant.  Ask for birth control (contraception) if you want to prevent pregnancy. Osteoporosis and menopause Osteoporosis is a disease in which the bones lose minerals and strength with aging. This can result in  bone fractures. If you are 60 years old or older, or if you are at risk for osteoporosis and fractures, ask your health care provider if you should:  Be screened for bone loss.  Take a calcium or vitamin D supplement to lower your risk of fractures.  Be given hormone replacement therapy (HRT) to treat symptoms of menopause. Follow these instructions at home: Lifestyle  Do not use any products that contain nicotine or tobacco, such as cigarettes, e-cigarettes, and chewing tobacco. If you need help quitting, ask your health care provider.  Do not use street drugs.  Do not share needles.  Ask your health care provider for help if you need support or information about quitting drugs. Alcohol use  Do not drink alcohol if: ? Your health care provider tells you not to drink. ? You are pregnant, may be pregnant, or are planning to become pregnant.  If you drink alcohol: ? Limit how much you use to 0-1 drink a day. ? Limit intake if you are breastfeeding.  Be aware of how much alcohol is in your drink. In the [place] one drink equals one 12 oz bottle of beer (355 mL), one 5 oz glass of wine (148 mL), or one 1 oz glass of hard liquor (44 mL). General instructions  Schedule regular health, dental, and eye exams.  Stay current with your vaccines.  Tell your health care provider if: ? You often feel depressed. ? You have ever been abused or do not feel safe at home. Summary  Adopting a healthy lifestyle and getting preventive care are important in promoting health and wellness.  Follow your health care provider's instructions about healthy diet, exercising, and getting tested or screened for diseases.  Follow your health care provider's instructions on monitoring your cholesterol and blood pressure. This information is not intended to replace advice given to you by your health care provider. Make sure you discuss any questions you have with your health care provider. Document Released:  03/08/2011 Document Revised: 08/16/2018 Document Reviewed: 08/16/2018 Elsevier Patient Education  2020 [place]  M.D.

## 2019-05-02 ENCOUNTER — Other Ambulatory Visit: Payer: Self-pay | Admitting: Neurology

## 2019-05-02 ENCOUNTER — Encounter: Payer: Self-pay | Admitting: Internal Medicine

## 2019-05-02 ENCOUNTER — Ambulatory Visit (INDEPENDENT_AMBULATORY_CARE_PROVIDER_SITE_OTHER): Payer: Medicare Other | Admitting: Internal Medicine

## 2019-05-02 VITALS — BP 122/70 | HR 67 | Temp 98.2°F | Ht 64.0 in | Wt 184.6 lb

## 2019-05-02 DIAGNOSIS — M20002 Unspecified deformity of left finger(s): Secondary | ICD-10-CM | POA: Diagnosis not present

## 2019-05-02 DIAGNOSIS — Z0000 Encounter for general adult medical examination without abnormal findings: Secondary | ICD-10-CM

## 2019-05-02 DIAGNOSIS — Z79899 Other long term (current) drug therapy: Secondary | ICD-10-CM | POA: Diagnosis not present

## 2019-05-02 DIAGNOSIS — E785 Hyperlipidemia, unspecified: Secondary | ICD-10-CM | POA: Diagnosis not present

## 2019-05-02 DIAGNOSIS — K589 Irritable bowel syndrome without diarrhea: Secondary | ICD-10-CM | POA: Diagnosis not present

## 2019-05-02 DIAGNOSIS — R51 Headache: Secondary | ICD-10-CM | POA: Diagnosis not present

## 2019-05-02 DIAGNOSIS — Z23 Encounter for immunization: Secondary | ICD-10-CM | POA: Diagnosis not present

## 2019-05-02 DIAGNOSIS — R519 Headache, unspecified: Secondary | ICD-10-CM

## 2019-05-02 LAB — CBC WITH DIFFERENTIAL/PLATELET
Basophils Absolute: 0.1 10*3/uL (ref 0.0–0.1)
Basophils Relative: 0.9 % (ref 0.0–3.0)
Eosinophils Absolute: 0.4 10*3/uL (ref 0.0–0.7)
Eosinophils Relative: 5.3 % — ABNORMAL HIGH (ref 0.0–5.0)
HCT: 38.6 % (ref 36.0–46.0)
Hemoglobin: 13.1 g/dL (ref 12.0–15.0)
Lymphocytes Relative: 32 % (ref 12.0–46.0)
Lymphs Abs: 2.7 10*3/uL (ref 0.7–4.0)
MCHC: 33.8 g/dL (ref 30.0–36.0)
MCV: 84 fl (ref 78.0–100.0)
Monocytes Absolute: 0.6 10*3/uL (ref 0.1–1.0)
Monocytes Relative: 7.4 % (ref 3.0–12.0)
Neutro Abs: 4.6 10*3/uL (ref 1.4–7.7)
Neutrophils Relative %: 54.4 % (ref 43.0–77.0)
Platelets: 229 10*3/uL (ref 150.0–400.0)
RBC: 4.59 Mil/uL (ref 3.87–5.11)
RDW: 12.7 % (ref 11.5–15.5)
WBC: 8.5 10*3/uL (ref 4.0–10.5)

## 2019-05-02 LAB — HEPATIC FUNCTION PANEL
ALT: 15 U/L (ref 0–35)
AST: 18 U/L (ref 0–37)
Albumin: 4.2 g/dL (ref 3.5–5.2)
Alkaline Phosphatase: 66 U/L (ref 39–117)
Bilirubin, Direct: 0.1 mg/dL (ref 0.0–0.3)
Total Bilirubin: 0.5 mg/dL (ref 0.2–1.2)
Total Protein: 7 g/dL (ref 6.0–8.3)

## 2019-05-02 LAB — LIPID PANEL
Cholesterol: 168 mg/dL (ref 0–200)
HDL: 53.9 mg/dL (ref >39.00)
LDL Cholesterol: 77 mg/dL (ref 0–99)
NonHDL: 113.66
Total CHOL/HDL Ratio: 3
Triglycerides: 182 mg/dL — ABNORMAL HIGH (ref 0.0–149.0)
VLDL: 36.4 mg/dL (ref 0.0–40.0)

## 2019-05-02 LAB — BASIC METABOLIC PANEL
BUN: 17 mg/dL (ref 6–23)
CO2: 26 mEq/L (ref 19–32)
Calcium: 9.5 mg/dL (ref 8.4–10.5)
Chloride: 104 mEq/L (ref 96–112)
Creatinine, Ser: 0.77 mg/dL (ref 0.40–1.20)
GFR: 73.71 mL/min (ref >60.00)
Glucose, Bld: 94 mg/dL (ref 70–99)
Potassium: 3.8 mEq/L (ref 3.5–5.1)
Sodium: 139 mEq/L (ref 135–145)

## 2019-05-02 LAB — TSH: TSH: 1.59 u[IU]/mL (ref 0.35–4.50)

## 2019-05-02 NOTE — Patient Instructions (Addendum)
Glad you are doing well.   Suggest  We get you to se a hand specialist about the left ring finger deformity after injury.  To get opinion if any intervention would be helpful .  Will notify you  of labs when available.   I agree staying away from crowds  Or gatherings  is the best way to avoid getting covid19  In addition to mask and hand washing      COVID-19: How to Protect Yourself and Others Know how it spreads  There is currently no vaccine to prevent coronavirus disease 2019 (COVID-19).  The best way to prevent illness is to avoid being exposed to this virus.  The virus is thought to spread mainly from person-to-person. ? Between people who are in close contact with one another (within about 6 feet). ? Through respiratory droplets produced when an infected person coughs, sneezes or talks. ? These droplets can land in the mouths or noses of people who are nearby or possibly be inhaled into the lungs. ? Some recent studies have suggested that COVID-19 may be spread by people who are not showing symptoms. Everyone should Clean your hands often  Wash your hands often with soap and water for at least 20 seconds especially after you have been in a public place, or after blowing your nose, coughing, or sneezing.  If soap and water are not readily available, use a hand sanitizer that contains at least 60% alcohol. Cover all surfaces of your hands and rub them together until they feel dry.  Avoid touching your eyes, nose, and mouth with unwashed hands. Avoid close contact  Stay home if you are sick.  Avoid close contact with people who are sick.  Put distance between yourself and other people. ? Remember that some people without symptoms may be able to spread virus. ? This is especially important for people who are at higher risk of getting very [employer]cy Cover your mouth and nose with a cloth face  cover when around others  You could spread COVID-19 to others even if you do not feel sick.  Everyone should wear a cloth face cover when they have to go out in public, for example to the grocery store or to pick up other necessities. ? Cloth face coverings should not be placed on young children under age 48, anyone who has trouble breathing, or is unconscious, incapacitated or otherwise unable to remove the mask without assistance.  The cloth face cover is meant to protect other people in case you are infected.  Do NOT use a facemask meant for a Dietitian.  Continue to keep about 6 feet between yourself and others. The cloth face cover is not a substitute for social distancing. Cover coughs and sneezes  If you are in a private setting and do not have on your cloth face covering, remember to always cover your mouth and nose with a tissue when you cough or sneeze or use the inside of your elbow.  Throw used tissues in the trash.  Immediately wash your hands with soap and water for at least 20 seconds. If soap and water are not readily available, clean your hands with a hand sanitizer that contains at least 60% alcohol. Clean and disinfect  Clean AND disinfect frequently touched surfaces daily. This includes tables, doorknobs, light switches, countertops, handles, desks, phones, keyboards, toilets, faucets, and sinks. [employer]  If surfaces are dirty, clean them: Use detergent or soap and water prior to disinfection.  Then, use  a household disinfectant. You can see a list of EPA-registered household disinfectants here. [email or web address].com 01/09/2019 This information is not intended to replace advice given to you by your health care provider. Make sure you discuss any questions you have with your health care provider. Document Released: 12/19/2018 Document Revised: 01/17/2019 Document Reviewed:  12/19/2018 Elsevier Patient Education  Freeborn Maintenance, Female Adopting a healthy lifestyle and getting preventive care are important in promoting health and wellness. Ask your health care provider about:  The right schedule for you to have regular tests and exams.  Things you can do on your own to prevent diseases and keep yourself healthy. What should I know about diet, weight, and exercise? Eat a healthy diet   Eat a diet that includes plenty of vegetables, fruits, low-fat dairy products, and lean protein.  Do not eat a lot of foods that are high in solid fats, added sugars, or sodium. Maintain a healthy weight Body mass index (BMI) is used to identify weight problems. It estimates body fat based on height and weight. Your health care provider can help determine your BMI and help you achieve or maintain a healthy weight. Get regular exercise Get regular exercise. This is one of the most important things you can do for your health. Most adults should:  Exercise for at least 150 minutes each week. The exercise should increase your heart rate and make you sweat (moderate-intensity exercise).  Do strengthening exercises at least twice a week. This is in addition to the moderate-intensity exercise.  Spend less time sitting. Even light physical activity can be beneficial. Watch cholesterol and blood lipids Have your blood tested for lipids and cholesterol at 72 years of age, then have this test every 5 years. Have your cholesterol levels checked more often if:  Your lipid or cholesterol levels are high.  You are older than 72 years of age.  You are at high risk for heart disease. What should I know about cancer screening? Depending on your health history and family history, you may need to have cancer screening at various ages. This may include screening for:  Breast cancer.  Cervical cancer.  Colorectal cancer.  Skin cancer.  Lung cancer. What  should I know about heart disease, diabetes, and high blood pressure? Blood pressure and heart disease  High blood pressure causes heart disease and increases the risk of stroke. This is more likely to develop in people who have high blood pressure readings, are of African descent, or are overweight.  Have your blood pressure checked: ? Every 3-5 years if you are 34-104 years of age. ? Every year if you are 63 years old or older. Diabetes Have regular diabetes screenings. This checks your fasting blood sugar level. Have the screening done:  Once every three years after age 18 if you are at a normal weight and have a low risk for diabetes.  More often and at a younger age if you are overweight or have a high risk for diabetes. What should I know about preventing infection? Hepatitis B If you have a higher risk for hepatitis B, you should be screened for this virus. Talk with your health care provider to find out if you are at risk for hepatitis B infection. Hepatitis C Testing is recommended for:  Everyone born from [birth year] through 1964.  Anyone with known risk factors for hepatitis C. Sexually transmitted infections (STIs)  Get screened for STIs, including gonorrhea and chlamydia, if: ? You are  sexually active and are younger than 72 years of age. ? You are older than 73 years of age and your health care provider tells you that you are at risk for this type of infection. ? Your sexual activity has changed since you were last screened, and you are at increased risk for chlamydia or gonorrhea. Ask your health care provider if you are at risk.  Ask your health care provider about whether you are at high risk for HIV. Your health care provider may recommend a prescription medicine to help prevent HIV infection. If you choose to take medicine to prevent HIV, you should first get tested for HIV. You should then be tested every 3 months for as long as you are taking the medicine. Pregnancy  If  you are about to stop having your period (premenopausal) and you may become pregnant, seek counseling before you get pregnant.  Take 400 to 800 micrograms (mcg) of folic acid every day if you become pregnant.  Ask for birth control (contraception) if you want to prevent pregnancy. Osteoporosis and menopause Osteoporosis is a disease in which the bones lose minerals and strength with aging. This can result in bone fractures. If you are 8 years old or older, or if you are at risk for osteoporosis and fractures, ask your health care provider if you should:  Be screened for bone loss.  Take a calcium or vitamin D supplement to lower your risk of fractures.  Be given hormone replacement therapy (HRT) to treat symptoms of menopause. Follow these instructions at home: Lifestyle  Do not use any products that contain nicotine or tobacco, such as cigarettes, e-cigarettes, and chewing tobacco. If you need help quitting, ask your health care provider.  Do not use street drugs.  Do not share needles.  Ask your health care provider for help if you need support or information about quitting drugs. Alcohol use  Do not drink alcohol if: ? Your health care provider tells you not to drink. ? You are pregnant, may be pregnant, or are planning to become pregnant.  If you drink alcohol: ? Limit how much you use to 0-1 drink a day. ? Limit intake if you are breastfeeding.  Be aware of how much alcohol is in your drink. In the [place] one drink equals one 12 oz bottle of beer (355 mL), one 5 oz glass of wine (148 mL), or one 1 oz glass of hard liquor (44 mL). General instructions  Schedule regular health, dental, and eye exams.  Stay current with your vaccines.  Tell your health care provider if: ? You often feel depressed. ? You have ever been abused or do not feel safe at home. Summary  Adopting a healthy lifestyle and getting preventive care are important in promoting health and  wellness.  Follow your health care provider's instructions about healthy diet, exercising, and getting tested or screened for diseases.  Follow your health care provider's instructions on monitoring your cholesterol and blood pressure. This information is not intended to replace advice given to you by your health care provider. Make sure you discuss any questions you have with your health care provider. Document Released: 03/08/2011 Document Revised: 08/16/2018 Document Reviewed: 08/16/2018 Elsevier Patient Education  2020 [employer].

## 2019-05-04 NOTE — Telephone Encounter (Signed)
Scheduled pt for the Saturday clinic as advised by Dr.Panosh. Patient was also informed to stop the Equate Chor 4 mg and advised that it can interact with some of her medications. Pt sounded a little confused so I had her verbalized understanding 2x and she finally understood what Dr. Regis Bill was advising. Pt was advised to go to her local ED or UC if sx worsen or persist.

## 2019-05-04 NOTE — Telephone Encounter (Signed)
Stop  The ? Chorphemiramine?  Especially if taking  Tramadol  And tizanidine and  Bentyl  Saturday virtual visit

## 2019-05-04 NOTE — Telephone Encounter (Signed)
Please advise 

## 2019-05-05 ENCOUNTER — Encounter: Payer: Self-pay | Admitting: Family Medicine

## 2019-05-05 ENCOUNTER — Ambulatory Visit (INDEPENDENT_AMBULATORY_CARE_PROVIDER_SITE_OTHER): Payer: Medicare Other | Admitting: Family Medicine

## 2019-05-05 ENCOUNTER — Other Ambulatory Visit: Payer: Self-pay

## 2019-05-05 DIAGNOSIS — R42 Dizziness and giddiness: Secondary | ICD-10-CM | POA: Diagnosis not present

## 2019-05-05 NOTE — Assessment & Plan Note (Addendum)
-  Transient dizziness likely related to medication.  She will continue to hold chlorpheniramine. -No other neuro symptoms to suggest TIA or Stroke at this time, discussed warning signs/red flags.  -Differential also includes eustachian tube dysfunction but denies ear pain/pressure at this time.  -I let her know that if symptoms return she should contact the clinic and likely be evaluated in person.

## 2019-05-05 NOTE — Progress Notes (Signed)
Michele Monroe - 73 y.o. female MRN [id number]  Date of birth: 1945/12/21   This visit type was conducted due to national recommendations for restrictions regarding the COVID-19 Pandemic (e.g. social distancing).  This format is felt to be most appropriate for this patient at this time.  All issues noted in this document were discussed and addressed.  No physical exam was performed (except for noted visual exam findings with Video Visits).  I discussed the limitations of evaluation and management by telemedicine and the availability of in person appointments. The patient expressed understanding and agreed to proceed.  I connected with@ on 05/05/19 at  9:40 AM EDT by a video enabled telemedicine application and verified that I am speaking with the correct person using two identifiers.   Patient Location: Home [place] [address]   Provider location:   [facility]  Chief Complaint  Patient presents with  . Dizziness    dizziness startedaftergetting flu shot 4 days ago/couldn't drive or function/stopped taking allergy meds    HPI  Michele Monroe is a 73 y.o. female who presents via audio/video conferencing for a telehealth visit today.  She reports experiencing dizziness yesterday. Describes as sensation of feeling "drunk", with room spinning sensation.  She states that she had difficulty with balance and would have been unable to drive if she needed to.  She contacted Dr. Velora Mediate office yesterday and was instructed to hold chlorpheniramine as she is also taking other medications that could interact with this.  She has stopped this medication and she reports that her dizziness has resolved today.  She denies fever, chills, sinus pain, ear pain/pressure, headache, vision change, weakness, numbness or tingling at this time.    ROS:  A comprehensive ROS was completed and negative except as noted per HPI  Past Medical History:  Diagnosis Date  . ALLERGIC RHINITIS  06/30/2010   perennial  . Allergy    seasonal  . Coughing up blood 08/13/2012   piece of tissue? will send to path poss from upper airway sinusitis  Final path was "abscess"   . GERD 06/30/2010  . Headache(784.0) 06/30/2010   migraines  . Hepatic steatosis   . HYPERLIPIDEMIA 06/30/2010  . HYPERTENSION 06/30/2010  . IBS 06/30/2010  . Internal hemorrhoids   . Renal lesion   . Varicose veins   . VITAMIN D DEFICIENCY 06/30/2010    Past Surgical History:  Procedure Laterality Date  . BREAST SURGERY     needle bx left breast  . COLONOSCOPY    . feet     surgery  . ROTATOR CUFF REPAIR Right   . varicose veins      Family History  Problem Relation Age of Onset  . Colon cancer Mother        age 72  . Breast cancer Mother   . Arthritis Brother   . Other Brother   . Breast cancer Cousin     Social History   Socioeconomic History  . Marital status: Widowed    Spouse name: Not on file  . Number of children: 2  . Years of education: Not on file  . Highest education level: Not on file  Occupational History  . 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 Financial resource strain: Not on file  . Food insecurity    Worry: Not on file    Inability: Not on file  . Transportation needs  Medical: Not on file    Non-medical: Not on file  Tobacco Use  . Smoking status: Former Smoker    Types: Cigarettes    Quit date: 09/06/1976    Years since quitting: 42.6  . Smokeless tobacco: Never Used  Substance and Sexual Activity  . Alcohol use: No    Alcohol/week: 0.0 standard drinks    Comment: rarely  . Drug use: No  . Sexual activity: Not on file  Lifestyle  . Physical activity    Days per week: Not on file    Minutes per session: Not on file  . Stress: Not on file  Relationships  . Social Herbalist on phone: Not on file    Gets together: Not on file    Attends religious service: Not on file    Active member of club or  organization: Not on file    Attends meetings of clubs or organizations: Not on file    Relationship status: Not on file  . Intimate partner violence    Fear of current or ex partner: Not on file    Emotionally abused: Not on file    Physically abused: Not on file    Forced sexual activity: Not on file  Other Topics Concern  . 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       Lives alone with her two small dogs in a one story home. 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      Current Outpatient Medications:  .  atenolol (TENORMIN) 25 MG tablet, Take 1 tablet by mouth every day, Disp: 90 tablet, Rfl: 2 .  atorvastatin (LIPITOR) 20 MG tablet, Take 1 tablet by mouth every day, Disp: 30 tablet, Rfl: 11 .  gabapentin (NEURONTIN) 100 MG capsule, TAKE 1 CAPSULE AT BEDTIME FOR 1 WEEK, THEN 1 CAPSULE TWICE DAILY FOR 1 WEEK, THEN 2 CAPSULES TWICE DAILY FOR 1 WEEK, THEN 3 CAPSULES TWICE DAILY, Disp: 180 capsule, Rfl: 0 .  omeprazole (PRILOSEC) 20 MG capsule, Take 20 mg by mouth daily., Disp: , Rfl:  .  tiZANidine (ZANAFLEX) 4 MG tablet, Take 1/2 to 1 tablet by mouth at bedtime for neck pain, Disp: 30 tablet, Rfl: 5 .  dicyclomine (BENTYL) 20 MG tablet, Take 1 tablet (20 mg total) by mouth daily. As needed (Patient not taking: Reported on 05/05/2019), Disp: 30 tablet, Rfl: 2 .  Multiple Vitamins-Minerals (MULTIVITAMIN ADULT PO), Take by mouth., Disp: , Rfl:  .  rizatriptan (MAXALT) 10 MG tablet, Take 1 tablet by mouth at the onset of migraine, may repeat in 2 hours. Max of 2 doses per day (Patient not taking: Reported on 05/05/2019), Disp: 11 tablet, Rfl: 5 .  traMADol (ULTRAM) 50 MG tablet, Take 1 tablet by mouth every 6 hours as needed (Patient not taking: Reported on  05/05/2019), Disp: 15 tablet, Rfl: 2  EXAM:  VITALS per patient if applicable: There were no vitals taken for this visit.  GENERAL: alert, oriented, appears well and in no acute distress  HEENT: atraumatic, conjunttiva clear, no obvious abnormalities on inspection of external nose and ears  NECK: normal movements of the head and neck  LUNGS: on inspection no signs of respiratory distress, breathing rate appears normal, no obvious gross SOB, gasping or wheezing  CV: no obvious cyanosis  MS: moves all visible extremities without noticeable abnormality  PSYCH/NEURO: pleasant and cooperative, no obvious depression or anxiety, speech and thought processing grossly intact  ASSESSMENT AND PLAN:  Discussed the following assessment and plan:  Dizziness -Transient dizziness likely related to medication.  She will continue to hold chlorpheniramine. -No other neuro symptoms to suggest TIA or Stroke at this time, discussed warning signs/red flags.  -Differential also includes eustachian tube dysfunction but denies ear pain/pressure at this time.  -I let her know that if symptoms return she should contact the clinic and likely be evaluated in person.         I discussed the assessment and treatment plan with the patient. The patient was provided an opportunity to ask questions and all were answered. The patient agreed with the plan and demonstrated an understanding of the instructions.   The patient was advised to call back or seek an in-person evaluation if the symptoms worsen or if the condition fails to improve as anticipated.    Luetta Nutting, DO

## 2019-06-04 ENCOUNTER — Ambulatory Visit
Admission: RE | Admit: 2019-06-04 | Discharge: 2019-06-04 | Disposition: A | Discharge status: Home / Self Care | Payer: Medicare Other | Source: Ambulatory Visit | Attending: Internal Medicine | Admitting: Internal Medicine

## 2019-06-04 ENCOUNTER — Other Ambulatory Visit: Payer: Self-pay

## 2019-06-04 DIAGNOSIS — E2839 Other primary ovarian failure: Secondary | ICD-10-CM

## 2019-06-04 DIAGNOSIS — Z780 Asymptomatic menopausal state: Secondary | ICD-10-CM | POA: Diagnosis not present

## 2019-06-04 DIAGNOSIS — Z1231 Encounter for screening mammogram for malignant neoplasm of breast: Secondary | ICD-10-CM

## 2019-06-04 DIAGNOSIS — M8589 Other specified disorders of bone density and structure, multiple sites: Secondary | ICD-10-CM | POA: Diagnosis not present

## 2019-06-06 NOTE — Telephone Encounter (Signed)
done

## 2019-06-06 NOTE — Telephone Encounter (Signed)
Done

## 2019-06-06 NOTE — Telephone Encounter (Signed)
1000 IU of Vitamin  D per day would be worth while  . Make sure you get calcium in your diet  With dairy  Fish or other equivalent to 1200 mg per day and   if not sure you can  take a supplement  Of 600 mg twice a day of calcium  Weight bearing exercise  Is good for bone health   Bone Health Bones protect organs, store calcium, anchor muscles, and support the whole body. Keeping your bones strong is important, especially as you get older. You can take actions to help keep your bones strong and healthy. Why is keeping my bones healthy important?  Keeping your bones healthy is important because your body constantly replaces bone cells. Cells get old, and new cells take their place. As we age, we lose bone cells because the body may not be able to make enough new cells to replace the old cells. The amount of bone cells and bone tissue you have is referred to as bone mass. The higher your bone mass, the stronger your bones. The aging process leads to an overall loss of bone mass in the body, which can increase the likelihood of:  Joint pain and stiffness.  Broken bones.  A condition in which the bones become weak and brittle (osteoporosis). A large decline in bone mass occurs in older adults. In women, it occurs about the time of menopause. What actions can I take to keep my bones healthy? Good health habits are important for maintaining healthy bones. This includes eating nutritious foods and exercising regularly. To have healthy bones, you need to get enough of the right minerals and vitamins. Most nutrition experts recommend getting these nutrients from the foods that you eat. In some cases, taking supplements may also be recommended. Doing certain types of exercise is also important for bone health. What are the nutritional recommendations for healthy bones?  Eating a well-balanced diet with plenty of calcium and vitamin D will help to protect your bones. Nutritional recommendations vary from  person to person. Ask your health care provider what is healthy for you. Here are some general guidelines. Get enough calcium Calcium is the most important (essential) mineral for bone health. Most people can get enough calcium from their diet, but supplements may be recommended for people who are at risk for osteoporosis. Good sources of calcium include:  Dairy products, such as low-fat or nonfat milk, cheese, and yogurt.  Dark green leafy vegetables, such as bok choy and broccoli.  Calcium-fortified foods, such as orange juice, cereal, bread, soy beverages, and tofu products.  Nuts, such as almonds. Follow these recommended amounts for daily calcium intake:  Children, age 70-3: 700 mg.  Children, age 32-8: 1,000 mg.  Children, age 72-13: 1,300 mg.  Teens, age 3-18: 1,300 mg.  Adults, age 77-50: 1,000 mg.  Adults, age 74-70: ? Men: 1,000 mg. ? Women: 1,200 mg.  Adults, age 321 or older: 1,200 mg.  Pregnant and breastfeeding females: ? Teens: 1,300 mg. ? Adults: 1,000 mg. Get enough vitamin D Vitamin D is the most essential vitamin for bone health. It helps the body absorb calcium. Sunlight stimulates the skin to make vitamin D, so be sure to get enough sunlight. If you live in a cold climate or you do not get outside often, your health care provider may recommend that you take vitamin D supplements. Good sources of vitamin D in your diet include:  Egg yolks.  Saltwater fish.  Milk and cereal  fortified with vitamin D. Follow these recommended amounts for daily vitamin D intake:  Children and teens, age 39-18: 600 international units.  Adults, age 72 or younger: 400-800 international units.  Adults, age 20 or older: 800-1,000 international units. Get other important nutrients Other nutrients that are important for bone health include:  Phosphorus. This mineral is found in meat, poultry, dairy foods, nuts, and legumes. The recommended daily intake for adult men and adult  women is 700 mg.  Magnesium. This mineral is found in seeds, nuts, dark green vegetables, and legumes. The recommended daily intake for adult men is 400-420 mg. For adult women, it is 310-320 mg.  Vitamin K. This vitamin is found in green leafy vegetables. The recommended daily intake is 120 mg for adult men and 90 mg for adult women. What type of physical activity is best for building and maintaining healthy bones? Weight-bearing and strength-building activities are important for building and maintaining healthy bones. Weight-bearing activities cause muscles and bones to work against gravity. Strength-building activities increase the strength of the muscles that support bones. Weight-bearing and muscle-building activities include:  Walking and hiking.  Jogging and running.  Dancing.  Gym exercises.  Lifting weights.  Tennis and racquetball.  Climbing stairs.  Aerobics. Adults should get at least 30 minutes of moderate physical activity on most days. Children should get at least 60 minutes of moderate physical activity on most days. Ask your health care provider what type of exercise is best for you. How can I find out if my bone mass is low? Bone mass can be measured with an X-ray test called a bone mineral density (BMD) test. This test is recommended for all women who are age 46 or older. It may also be recommended for:  Men who are age 67 or older.  People who are at risk for osteoporosis because of: ? Having bones that break easily. ? Having a long-term disease that weakens bones, such as kidney disease or rheumatoid arthritis. ? Having menopause earlier than normal. ? Taking medicine that weakens bones, such as steroids, thyroid hormones, or hormone treatment for breast cancer or prostate cancer. ? Smoking. ? Drinking three or more alcoholic drinks a day. If you find that you have a low bone mass, you may be able to prevent osteoporosis or further bone loss by changing your  diet and lifestyle. Where can I find more information? For more information, check out the following websites:  [place]: AviationTales.fr  [employer] of Health: www.bones.SouthExposed.es  International Osteoporosis Foundation: Administrator.iofbonehealth.org Summary  The aging process leads to an overall loss of bone mass in the body, which can increase the likelihood of broken bones and osteoporosis.  Eating a well-balanced diet with plenty of calcium and vitamin D will help to protect your bones.  Weight-bearing and strength-building activities are also important for building and maintaining strong bones.  Bone mass can be measured with an X-ray test called a bone mineral density (BMD) test. This information is not intended to replace advice given to you by your health care provider. Make sure you discuss any questions you have with your health care provider. Document Released: 11/13/2003 Document Revised: 09/19/2017 Document Reviewed: 09/19/2017 Elsevier Patient Education  2020 [employer].

## 2019-06-12 ENCOUNTER — Ambulatory Visit: Payer: Medicare Other | Admitting: Neurology

## 2019-06-13 ENCOUNTER — Other Ambulatory Visit: Payer: Self-pay | Admitting: Neurology

## 2019-06-13 DIAGNOSIS — G43809 Other migraine, not intractable, without status migrainosus: Secondary | ICD-10-CM

## 2019-06-15 NOTE — Telephone Encounter (Signed)
We can refill the rizatriptan.  However, I will not refill the tramadol until she makes a follow up appointment.

## 2019-06-15 NOTE — Telephone Encounter (Signed)
Requested Prescriptions   Pending Prescriptions Disp Refills  . traMADol (ULTRAM) 50 MG tablet [Pharmacy Med Name: Tramadol Hydrochloride 50mg  Tablet] 15 tablet 11    Sig: Take 1 tablet by mouth every 6 hours as needed  . rizatriptan (MAXALT) 10 MG tablet [Pharmacy Med Name: Rizatriptan Benzoate 10mg  Tablet] 11 tablet 11    Sig: Take 1 tablet at the onset of migraine, may repeat in 2 hours. Max of 2 doses per day   Rx last filled: 12/21/18 #11 5 refills 04/16/19 #15 2 refills  Pt last seen: 02/08/19   Follow up appt scheduled:none

## 2019-07-02 ENCOUNTER — Other Ambulatory Visit: Payer: Self-pay | Admitting: Neurology

## 2019-07-02 MED ORDER — TRAMADOL HCL 50 MG PO TABS: 50.0000 mg | ORAL_TABLET | Freq: Four times a day (QID) | ORAL | 2 refills | Status: DC | PRN

## 2019-07-02 NOTE — Telephone Encounter (Signed)
Requested Prescriptions   Pending Prescriptions Disp Refills  . traMADol (ULTRAM) 50 MG tablet 15 tablet 2    Sig: Take 1 tablet (50 mg total) by mouth every 6 (six) hours as needed.   Rx last filled: 04/16/19 #15 2 refills  Pt last seen: 02/08/19   Follow up appt scheduled:08/30/19

## 2019-07-02 NOTE — Telephone Encounter (Signed)
Correction to the phone note below. The Pharmacy number is [id number]. Thank you

## 2019-07-02 NOTE — Telephone Encounter (Signed)
Patient called regarding needing to set up a virtual visit and to also get a refill on her Tramadol medication. She said that she uses [facility] for her Pharmacy. The pharmacy # is [phone]. She is scheduled for an appointment in December. Thank you

## 2019-07-02 NOTE — Telephone Encounter (Signed)
Provider sent

## 2019-08-07 ENCOUNTER — Other Ambulatory Visit: Payer: Self-pay | Admitting: Neurology

## 2019-08-07 DIAGNOSIS — G43809 Other migraine, not intractable, without status migrainosus: Secondary | ICD-10-CM

## 2019-08-07 NOTE — Telephone Encounter (Signed)
Requested Prescriptions   Pending Prescriptions Disp Refills  . rizatriptan (MAXALT) 10 MG tablet [Pharmacy Med Name: Rizatriptan Benzoate 10mg  Tablet] 11 tablet 11    Sig: Take 1 tablet at the onset of migraine, may repeat in 2 hours. Max of 2 doses per day   Rx last filled:06/15/19 #11 1 REFILLS  Pt last seen: 02/08/19   Follow up appt scheduled:08/30/19

## 2019-08-09 ENCOUNTER — Other Ambulatory Visit: Payer: Self-pay

## 2019-08-09 ENCOUNTER — Ambulatory Visit: Payer: Self-pay | Admitting: Internal Medicine

## 2019-08-09 DIAGNOSIS — Z20822 Contact with and (suspected) exposure to covid-19: Secondary | ICD-10-CM

## 2019-08-09 NOTE — Telephone Encounter (Signed)
Pt reports nausea, vomiting, onset Sunday. No vomiting since Sunday but nausea remains.Marland Kitchen "Mild, more like no appetite." Also reports sore throat, headache, "Coughing up a lot of mucous at times." States is staying hydrated.  Denies fever, no dizziness, no abdominal pain or diarrhea. States headache since Sunday, has h/o migraines, taking Rizatriptan.  Pt requesting information on Covid testing sites. Information provided, assured pt [place] would route to practice for Dr. Velora Mediate review. Care advise given, advised ED if symptoms worsen. States she will do virtual appt if necessary but "Going to get tested now." CB# [phone]   Reason for Disposition . Unexplained nausea  Answer Assessment - Initial Assessment Questions 1. NAUSEA SEVERITY: "How bad is the nausea?" (e.g., mild, moderate, severe; dehydration, weight loss)   - MILD: loss of appetite without change in eating habits   - MODERATE: decreased oral intake without significant weight loss, dehydration, or malnutrition   - SEVERE: inadequate caloric or fluid intake, significant weight loss, symptoms of dehydration     moderate 2. ONSET: "When did the nausea begin?"     Sunday 3. VOMITING: "Any vomiting?" If so, ask: "How many times today?"     Sunday 4-5 times, not since Sunday 4. RECURRENT SYMPTOM: "Have you had nausea before?" If so, ask: "When was the last time?" "What happened that time?"     No 5. CAUSE: "What do you think is causing the nausea?"     Headache Sunday ,H/O migraines, generalized weakness, sore throat, mild SOB on exertion.  Protocols used: NAUSEA-A-AH

## 2019-08-09 NOTE — Telephone Encounter (Signed)
See note

## 2019-08-10 NOTE — Telephone Encounter (Signed)
fyi pt went and got covid testing is wondering what else to do please advise

## 2019-08-10 NOTE — Telephone Encounter (Signed)
See mychart message responded back to pt .

## 2019-08-10 NOTE — Telephone Encounter (Signed)
I agree with nurse  triage  Advice   Stay hydrated and get tested  If shortness of breath or sever pain or vomiting to dehydration seem ED care  She can let us know how doing after the weekend   And if needed can do Virtual visit at that time

## 2019-08-12 LAB — NOVEL CORONAVIRUS, NAA: SARS-CoV-2, NAA: NOT DETECTED

## 2019-08-13 NOTE — Telephone Encounter (Signed)
Negative test means no covid 19 virus detected  There is a small chance of false negative test   But the negative testing is reassuring  taht this could be a different  Virus bug that will pass on its own . With TLC  If fr not getting better over the next  Few days then firtual visit

## 2019-08-16 NOTE — Telephone Encounter (Signed)
error 

## 2019-08-20 ENCOUNTER — Other Ambulatory Visit: Payer: Self-pay

## 2019-08-20 MED ORDER — TIZANIDINE HCL 4 MG PO TABS: ORAL_TABLET | ORAL | 5 refills | Status: DC

## 2019-08-20 NOTE — Telephone Encounter (Signed)
Hard fax from pharmacy  Requested Prescriptions   Pending Prescriptions Disp Refills  . tiZANidine (ZANAFLEX) 4 MG tablet 30 tablet 5    Sig: Take 1/2 to 1 tablet at bedtime for neck pain   Rx last filled:12/21/18 #30 5 refills  Pt last seen:02/08/19  Follow up appt scheduled:08/30/19

## 2019-08-29 NOTE — Progress Notes (Addendum)
Virtual Visit via Video Note The purpose of this virtual visit is to provide medical care while limiting exposure to the novel coronavirus.    Consent was obtained for video visit:  Yes.   Answered questions that patient had about telehealth interaction:  Yes.   I discussed the limitations, risks, security and privacy concerns of performing an evaluation and management service by telemedicine. I also discussed with the patient that there may be a patient responsible charge related to this service. The patient expressed understanding and agreed to proceed.  Pt location: Home Physician Location: Office Name of referring provider:  Burnis Medin, MD I connected with Michele Monroe at patients initiation/request on 08/30/2019 at  8:30 AM EST by video enabled telemedicine application and verified that I am speaking with the correct person using two identifiers. Pt MRN:  [id number] Pt DOB:  03-18-1946 Video Participants:  Michele Monroe   History of Present Illness:  Michele Monroe is a 73 year old right-handed female with hypertension, GERD, IBS, hyperlipidemia who follows up for migraine  UPDATE: Started on gabapentin in June.  Plan was to titrate to 300mg  twice daily but it was too strong, so she remained on 100mg  at bedtime.  Intensity:  severe Duration:  1 hour Frequency:  4 in past 30 days Abortive therapy: Rizatriptan 10mg  for migraines, tramadol for abortive neck and shoulder pain. Muscle relaxant:  Tizanidine 4mg  at bedtime Antihypertensive:  Atenolol 25mg  daily Antiepileptic:  Gabapentin 100mg  at bedtime  HISTORY: Onset: 73 years old Location: Bilateral occipital region Quality: throbbing Initial intensity: Starts as 3/10 and progresses to 10/10 She has chronic neck pain that is worse with neck movement.  Migraines often preceded by flare up of neck and shoulder pain. Aura: no Prodrome: no Postdrome: nausea Associated symptoms: Sometimes nausea and vomiting.  Photophobia and phonophobia. No visual disturbance.. She has not had any new worse headache of her life, waking up from sleep InitialDuration: Several days without treatment (30 minutes with Maxalt) InitialFrequency: 2 to 3 days per week InitialFrequency of abortive medication: 2 days per week Triggers/exacerbating factors: Neck movement, change in barometric pressure Relieving factors: None Activity: Aggravates. Needs to lay down  Past NSAIDS: Ibuprofen, Aleve, Excedrin Past analgesics: Excedrin, Tylenol, tramadol Past abortive triptans: no Past muscle relaxants: Flexeril Past anti-emetic: Yes but uncertain of type Past antihypertensive medications: no Past antidepressant medications: Unsure. Not amitriptyline, nortriptyline or Effexor Past anticonvulsant medications: topiramate (ineffective) Past vitamins/Herbal/Supplements: no Other past therapies: heat and ice, exercises, massage for neck pain  Family history of headache: Daughter has migraines  Report of an old CT of the head from 06/24/05 (images not currently available for review) demonstrated "two areas of low density in the inferior left basal ganglia" felt to be more likely prominent perivascular spaces rather than old lacunar infarcts. CT of cervical spine on the same day demonstrated multilevel degenerative changes.  MRI of cervical spine from 06/06/17 was personally reviewed and demonstrated multilevel spondylolisthesis with cervical facet arthropathy with severe neural foraminal stenosis at bilateral C6 and left C7.She was evaluated by interventional pain management who told her nothing interventional was warranted as it was all arthritic changes.  She denies radicular pain down the arms but notes that she wakes up with bilateral hand numbness, which resolves when she shakes her hands out.  Past Medical History: Past Medical History:  Diagnosis Date  . ALLERGIC RHINITIS 06/30/2010   perennial  .  Allergy    seasonal  . Coughing  up blood 08/13/2012   piece of tissue? will send to path poss from upper airway sinusitis  Final path was "abscess"   . GERD 06/30/2010  . Headache(784.0) 06/30/2010   migraines  . Hepatic steatosis   . HYPERLIPIDEMIA 06/30/2010  . HYPERTENSION 06/30/2010  . IBS 06/30/2010  . Internal hemorrhoids   . Renal lesion   . Varicose veins   . VITAMIN D DEFICIENCY 06/30/2010    Medications: Outpatient Encounter Medications as of 08/30/2019  Medication Sig  . atenolol (TENORMIN) 25 MG tablet Take 1 tablet by mouth every day  . atorvastatin (LIPITOR) 20 MG tablet Take 1 tablet by mouth every day  . dicyclomine (BENTYL) 20 MG tablet Take 1 tablet (20 mg total) by mouth daily. As needed (Patient not taking: Reported on 05/05/2019)  . gabapentin (NEURONTIN) 100 MG capsule TAKE 1 CAPSULE AT BEDTIME FOR 1 WEEK, THEN 1 CAPSULE TWICE DAILY FOR 1 WEEK, THEN 2 CAPSULES TWICE DAILY FOR 1 WEEK, THEN 3 CAPSULES TWICE DAILY  . Multiple Vitamins-Minerals (MULTIVITAMIN ADULT PO) Take by mouth.  Marland Kitchen omeprazole (PRILOSEC) 20 MG capsule Take 20 mg by mouth daily.  . rizatriptan (MAXALT) 10 MG tablet Take 1 tablet at the onset of migraine, may repeat in 2 hours. Max of 2 doses per day  . tiZANidine (ZANAFLEX) 4 MG tablet Take 1/2 to 1 tablet at bedtime for neck pain  . traMADol (ULTRAM) 50 MG tablet Take 1 tablet (50 mg total) by mouth every 6 (six) hours as needed.   No facility-administered encounter medications on file as of 08/30/2019.    Allergies: No Known Allergies  Family History: Family History  Problem Relation Age of Onset  . Colon cancer Mother        age 63  . Breast cancer Mother   . Arthritis Brother   . Other Brother   . Breast cancer Cousin     Social History: Social History   Socioeconomic History  . Marital status: Widowed    Spouse name: Not on file  . Number of children: 2  . Years of education: Not on file  . Highest education level: Not on  file  Occupational History  . 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 Smoking status: Former Smoker    Types: Cigarettes    Quit date: 09/06/1976    Years since quitting: 43.0  . Smokeless tobacco: Never Used  Substance and Sexual Activity  . Alcohol use: No    Alcohol/week: 0.0 standard drinks    Comment: rarely  . Drug use: No  . Sexual activity: Not on file  Other Topics Concern  . 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       Lives alone with her two small dogs in a one story home. 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    Social Determinants of Health   Financial Resource Strain:   . Difficulty of Paying Living Expenses: Not on file  Food Insecurity:   . Worried About Charity fundraiser in the Last Year: Not on file  . Ran Out of Food in the Last Year: Not on  file  Transportation Needs:   . Film/video editor (Medical): Not on file  . Lack of Transportation (Non-Medical): Not on file  Physical Activity:   . Days of Exercise per Week: Not on file  . Minutes of Exercise per Session: Not on file  Stress:   . Feeling of Stress : Not on file  Social Connections:   . Frequency of Communication with Friends and Family: Not on file  . Frequency of Social Gatherings with Friends and Family: Not on file  . Attends Religious Services: Not on file  . Active Member of Clubs or Organizations: Not on file  . Attends Archivist Meetings: Not on file  . Marital Status: Not on file  Intimate Partner Violence:   . Fear of Current or Ex-Partner: Not on file  . Emotionally Abused: Not on file  . Physically Abused: Not on file  . Sexually Abused: Not on file    Observations/Objective:   Height 5\' 5"   (1.651 m), weight 175 lb (79.4 kg). No acute distress.  Alert and oriented.  Speech fluent and not dysarthric.  Language intact.  Eyes orthophoric on primary gaze.  Face symmetric.  Assessment and Plan:   1. Migraine without aura, without status migrainosus, not intractable 2.  Cervicaglia  1.  For preventative management, gabapentin 100mg  at bedtime 2.  For abortive therapy, tramadol; tizanidine for neck pain. 3.  Limit use of pain relievers to no more than 2 days out of week to prevent risk of rebound or medication-overuse headache. 4.  Keep headache diary 5.  Exercise, hydration, caffeine cessation, sleep hygiene, monitor for and avoid triggers 6.  Consider:  magnesium citrate 400mg  daily, riboflavin 400mg  daily, and coenzyme Q10 100mg  three times daily 7. Follow up 6 months  Follow Up Instructions:    -I discussed the assessment and treatment plan with the patient. The patient was provided an opportunity to ask questions and all were answered. The patient agreed with the plan and demonstrated an understanding of the instructions.   The patient was advised to call back or seek an in-person evaluation if the symptoms worsen or if the condition fails to improve as anticipated.   Dudley Major, DO

## 2019-08-30 ENCOUNTER — Other Ambulatory Visit: Payer: Self-pay

## 2019-08-30 ENCOUNTER — Encounter: Payer: Self-pay | Admitting: Neurology

## 2019-08-30 ENCOUNTER — Telehealth (INDEPENDENT_AMBULATORY_CARE_PROVIDER_SITE_OTHER): Payer: Medicare Other | Admitting: Neurology

## 2019-08-30 VITALS — Ht 65.0 in | Wt 175.0 lb

## 2019-08-30 DIAGNOSIS — G43009 Migraine without aura, not intractable, without status migrainosus: Secondary | ICD-10-CM | POA: Diagnosis not present

## 2019-08-30 DIAGNOSIS — M542 Cervicalgia: Secondary | ICD-10-CM

## 2019-09-02 ENCOUNTER — Other Ambulatory Visit: Payer: Self-pay | Admitting: Internal Medicine

## 2019-09-07 ENCOUNTER — Other Ambulatory Visit: Payer: Self-pay | Admitting: Internal Medicine

## 2019-09-13 DIAGNOSIS — M25511 Pain in right shoulder: Secondary | ICD-10-CM | POA: Diagnosis not present

## 2019-09-13 DIAGNOSIS — M7541 Impingement syndrome of right shoulder: Secondary | ICD-10-CM | POA: Diagnosis not present

## 2019-10-01 ENCOUNTER — Telehealth (INDEPENDENT_AMBULATORY_CARE_PROVIDER_SITE_OTHER): Payer: Medicare Other | Admitting: Internal Medicine

## 2019-10-01 ENCOUNTER — Other Ambulatory Visit: Payer: Self-pay

## 2019-10-01 ENCOUNTER — Encounter: Payer: Self-pay | Admitting: Internal Medicine

## 2019-10-01 ENCOUNTER — Ambulatory Visit: Payer: Medicare Other | Attending: Internal Medicine

## 2019-10-01 DIAGNOSIS — R0981 Nasal congestion: Secondary | ICD-10-CM | POA: Diagnosis not present

## 2019-10-01 DIAGNOSIS — Z20822 Contact with and (suspected) exposure to covid-19: Secondary | ICD-10-CM

## 2019-10-01 DIAGNOSIS — Z8709 Personal history of other diseases of the respiratory system: Secondary | ICD-10-CM

## 2019-10-01 DIAGNOSIS — J22 Unspecified acute lower respiratory infection: Secondary | ICD-10-CM

## 2019-10-01 DIAGNOSIS — J069 Acute upper respiratory infection, unspecified: Secondary | ICD-10-CM | POA: Diagnosis not present

## 2019-10-01 NOTE — Progress Notes (Signed)
Virtual Visit via Video Note  I connected with@ on 10/01/19 at  9:30 AM EST by a video enabled telemedicine application and verified that I am speaking with the correct person using two identifiers. Location patient: home Location provider:work  office Persons participating in the virtual visit: patient, provider  [place] national recommendations  regarding COVID 19 pandemic   video visit is advised over in office visit for this patient.  Patient aware  of the limitations of evaluation and management by telemedicine and  availability of in person appointments. and agreed to proceed.   HPI: Michele Monroe presents for video visit SDA  Onset with sneezing and then st and runny nose  Onset Jan 22 And today some yellow nasal drainage  No fever  Cp  Has cough and ocass wheeze used albuterol with help  No face pain but very congested .  Says has sinus infection.    Lives alone with her 2 dogs no  Know  Exposures    ROS: See pertinent positives and negatives per HPI. No fever myalgias new gi sx  Past Medical History:  Diagnosis Date  . ALLERGIC RHINITIS 06/30/2010   perennial  . Allergy    seasonal  . Coughing up blood 08/13/2012   piece of tissue? will send to path poss from upper airway sinusitis  Final path was "abscess"   . GERD 06/30/2010  . Headache(784.0) 06/30/2010   migraines  . Hepatic steatosis   . HYPERLIPIDEMIA 06/30/2010  . HYPERTENSION 06/30/2010  . IBS 06/30/2010  . Internal hemorrhoids   . Renal lesion   . Varicose veins   . VITAMIN D DEFICIENCY 06/30/2010    Past Surgical History:  Procedure Laterality Date  . BREAST SURGERY     needle bx left breast  . COLONOSCOPY    . feet     surgery  . ROTATOR CUFF REPAIR Right   . varicose veins      Family History  Problem Relation Age of Onset  . Colon cancer Mother        age 78  . Breast cancer Mother   . Arthritis Brother   . Other Brother   . Breast cancer Cousin     Social History   Tobacco Use  .  Smoking status: Former Smoker    Types: Cigarettes    Quit date: 09/06/1976    Years since quitting: 43.0  . Smokeless tobacco: Never Used  Substance Use Topics  . Alcohol use: No    Alcohol/week: 0.0 standard drinks    Comment: rarely  . Drug use: No       EXAM: BP Readings from Last 3 Encounters:  05/02/19 122/70  04/24/19 110/70  07/04/18 138/80    VITALS per patient if applicable:  GENERAL: alert, oriented, appears well and in no acute distress nasal congestion nl speech  ocass bronchial sounding  cough   HEENT: atraumatic, conjunttiva clear, no obvious abnormalities on inspection of external nose and ears  NECK: normal movements of the head and neck  LUNGS: on inspection no signs of respiratory distress, breathing rate appears normal, no obvious gross SOB, gasping or wheezing  CV: no obvious cyanosis  PSYCH/NEURO: pleasant and cooperative, no obvious depression or anxiety, speech and thought processing grossly intact Lab Results  Component Value Date   WBC 8.5 05/02/2019   HGB 13.1 05/02/2019   HCT 38.6 05/02/2019   PLT 229.0 05/02/2019   GLUCOSE 94 05/02/2019   CHOL 168 05/02/2019   TRIG  182.0 (H) 05/02/2019   HDL 53.90 05/02/2019   LDLCALC 77 05/02/2019   ALT 15 05/02/2019   AST 18 05/02/2019   NA 139 05/02/2019   K 3.8 05/02/2019   CL 104 05/02/2019   CREATININE 0.77 05/02/2019   BUN 17 05/02/2019   CO2 26 05/02/2019   TSH 1.59 05/02/2019    ASSESSMENT AND PLAN:  Discussed the following assessment and plan:    ICD-10-CM   1. URI, acute  J06.9   2. Sinus congestion  R09.81   3. Acute respiratory infection  J22   4. History of sinusitis  Z87.09    Get covid testing , supportive care and be aware  of alarm sx   At this time antibiotic may not be helpful   But cont saline and contact us in 2 days about how  doing   If needed can add antibiotic  If  Indicated for bacterial sinusitis as she has a sig hx of same . Pt aware   Concern about vaccine se    Disc this  As trade off risk benefit and not using news sources for  Final work on safety or efficacy .  Counseled.   Expectant management and discussion of plan and treatment with opportunity to ask questions and all were answered. The patient agreed with the plan and demonstrated an understanding of the instructions.   Advised to call back or seek an in-person evaluation if worsening  or having  further concerns . Return if symptoms worsen or fail to improve, for 2 day progress. Outside external source  DATA REVIEWED:    Last testing in December  30 -33  Disc how to get tested appt  And  Plan for fu and vaccine counseling Total time on date  of service including record review ordering and plan of care:     Shanon Ace, MD

## 2019-10-01 NOTE — Telephone Encounter (Signed)
Pt has been scheduled virtual appt

## 2019-10-02 ENCOUNTER — Other Ambulatory Visit: Payer: Self-pay | Admitting: Neurology

## 2019-10-02 LAB — NOVEL CORONAVIRUS, NAA: SARS-CoV-2, NAA: NOT DETECTED

## 2019-10-03 ENCOUNTER — Telehealth: Payer: Self-pay | Admitting: Internal Medicine

## 2019-10-03 ENCOUNTER — Other Ambulatory Visit: Payer: Medicare Other

## 2019-10-03 MED ORDER — AMOXICILLIN-POT CLAVULANATE 875-125 MG PO TABS: 1.0000 | ORAL_TABLET | Freq: Two times a day (BID) | ORAL | 0 refills | Status: DC

## 2019-10-03 NOTE — Telephone Encounter (Signed)
Contact patient    Glad her test is negative  If sinusitis  are still bad  Send in antibiotic  augmentin 875 1 po bid for 7 days ( good fro bacterial but NOT viral infection)   Cough may last a week or 2 but if  Face and sinus problem not improving let us know

## 2019-10-03 NOTE — Telephone Encounter (Signed)
Please advise 

## 2019-10-03 NOTE — Telephone Encounter (Signed)
Spoke to patient and sent in medication

## 2019-10-03 NOTE — Telephone Encounter (Signed)
Pt states she was tested for covid and received the results yesterday 10/03/19 it was negative. Pt states she needs medication for her cough/viral infection. Pt would like to be contacted at [phone]  [facility]acy [facility] [address] [place])

## 2019-10-17 NOTE — Telephone Encounter (Signed)
See  Later messages

## 2019-10-21 ENCOUNTER — Other Ambulatory Visit: Payer: Self-pay | Admitting: Internal Medicine

## 2019-10-24 ENCOUNTER — Telehealth: Payer: Self-pay | Admitting: Internal Medicine

## 2019-10-24 NOTE — Telephone Encounter (Signed)
If she wasn't around  Son in law and no sx then no need but if contact with  Him can wait a few days from exposures    and then get tested  OR if gets sx  .

## 2019-10-24 NOTE — Telephone Encounter (Signed)
Pt's son-in-law tested pos for COVID yesterday and she has been around her grand-kids yesterday and today. She stated she feels fine and has no symptoms but wondering if she should go get tested?   Pt can be reached at [phone]

## 2019-10-24 NOTE — Telephone Encounter (Signed)
Please advise 

## 2019-10-25 NOTE — Telephone Encounter (Signed)
Pt states she had no contact with son in law and no symptoms at this time pt advised if sx start to get tested

## 2019-10-31 NOTE — Telephone Encounter (Signed)
error 

## 2019-11-01 NOTE — Telephone Encounter (Signed)
I advise getting the vaccine     Can make a virtual if need to discuss more  No evidence of   Increase  Of vaccine Associated deaths    Above baseline .  Allergic reactions  Rare but   Increased

## 2019-11-06 ENCOUNTER — Telehealth: Payer: Self-pay | Admitting: Internal Medicine

## 2019-11-06 MED ORDER — ATENOLOL 25 MG PO TABS: 25.0000 mg | ORAL_TABLET | Freq: Every day | ORAL | 0 refills | Status: DC

## 2019-11-06 MED ORDER — ATORVASTATIN CALCIUM 20 MG PO TABS: 20.0000 mg | ORAL_TABLET | Freq: Every day | ORAL | 0 refills | Status: DC

## 2019-11-06 NOTE — Telephone Encounter (Signed)
Left detailed message letting pt know that a week supply of atenolol and atorvastatin were sent in

## 2019-11-06 NOTE — Telephone Encounter (Signed)
Ok to refill atenolol and atorva   As requested  Refer  To dr Tomi Likens for the tizanidine since he is prescribing this  medication

## 2019-11-06 NOTE — Telephone Encounter (Signed)
Pt's supply of her medication is currently going through [facility] but where it is located they are having bad weather and stuck in transit. She is wondering if [facility] will call in a week supply for these medications until they can ship it out? Pt has been without these medications.  Medication Refill:  Atenolo  atorvastatin Tizanidine  [facility] [place]: [phone]   Pt can be reached at [phone]

## 2019-11-06 NOTE — Telephone Encounter (Signed)
Please advise 

## 2019-11-08 ENCOUNTER — Telehealth: Payer: Self-pay

## 2019-11-08 MED ORDER — ATENOLOL 25 MG PO TABS: 25.0000 mg | ORAL_TABLET | Freq: Every day | ORAL | 0 refills | Status: DC

## 2019-11-08 MED ORDER — ATORVASTATIN CALCIUM 20 MG PO TABS: 20.0000 mg | ORAL_TABLET | Freq: Every day | ORAL | 0 refills | Status: DC

## 2019-11-08 NOTE — Telephone Encounter (Signed)
Rx refill request came in for refill of atorvastatin, atenolol and tizanidine.  Atorvastatin and atenolol refilled. Tizanidine sent to PCP for approval.

## 2019-11-09 NOTE — Telephone Encounter (Signed)
I have already answered this  Previously    tizandidine refills should come from prescribing provider  In this case Dr Tomi Likens   Please forward this request to him...Marland KitchenMarland Kitchen

## 2019-11-11 NOTE — Telephone Encounter (Signed)
Noted  

## 2019-11-12 ENCOUNTER — Other Ambulatory Visit: Payer: Self-pay

## 2019-11-12 ENCOUNTER — Other Ambulatory Visit: Payer: Self-pay | Admitting: *Deleted

## 2019-11-12 ENCOUNTER — Telehealth: Payer: Self-pay | Admitting: Internal Medicine

## 2019-11-12 ENCOUNTER — Telehealth: Payer: Self-pay | Admitting: *Deleted

## 2019-11-12 MED ORDER — ATORVASTATIN CALCIUM 20 MG PO TABS: 20.0000 mg | ORAL_TABLET | Freq: Every day | ORAL | 0 refills | Status: DC

## 2019-11-12 MED ORDER — TIZANIDINE HCL 4 MG PO TABS: ORAL_TABLET | ORAL | 0 refills | Status: DC

## 2019-11-12 MED ORDER — ATENOLOL 25 MG PO TABS: 25.0000 mg | ORAL_TABLET | Freq: Every day | ORAL | 0 refills | Status: DC

## 2019-11-12 NOTE — Telephone Encounter (Signed)
Pt was returning Kendra's call. Needs call back

## 2019-11-12 NOTE — Telephone Encounter (Signed)
Called patient to follow-up on dizziness she called about on 11/10/19. Patient stated that she had already spoken with someone and that she told them she was feeling much better. Patient believed it was due to not having a medication for a few days because she has gotten better since getting back on her medications. Nothing further needed at this time.

## 2019-11-12 NOTE — Telephone Encounter (Signed)
[id number] [place] [place] [address]

## 2019-11-12 NOTE — Telephone Encounter (Signed)
Left message to return phone call. If pt returns call please [facility]

## 2019-11-12 NOTE — Telephone Encounter (Signed)
Medication has bee sent to pt preferred pharmacy. Tried to call pt but no answer.

## 2019-11-12 NOTE — Telephone Encounter (Signed)
Patient wanted to let Dr. Regis Bill know that the has High BP and cholesterol and that she is feeling better today.   She is going today to pick up anpther weeks worth of her medication.  She still hasn't received her medication from  [facility], [place] Phone:  [phone]  Fax:  [phone]     That was ordered back in February and they told her that it is delayed because of the weather.  Can you look into where her medication is please?  Please advise

## 2019-11-12 NOTE — Telephone Encounter (Signed)
Left message to return phone call.

## 2019-11-19 ENCOUNTER — Other Ambulatory Visit: Payer: Self-pay

## 2019-11-19 NOTE — Patient Outreach (Signed)
[facility]) Care Management  11/19/2019  SISILIA BROOKES 1946/09/06 [id number]   Medication Adherence call to Mrs. Gerton Compliant Voice message left with a call back number. Mrs. Alomar is showing past due on Atorvastatin 20 mg under [facility].   [facility] Management Direct Dial [phone]  Fax [phone] [email or web address]

## 2019-11-26 DIAGNOSIS — H2513 Age-related nuclear cataract, bilateral: Secondary | ICD-10-CM | POA: Diagnosis not present

## 2019-11-28 DIAGNOSIS — M25511 Pain in right shoulder: Secondary | ICD-10-CM | POA: Diagnosis not present

## 2019-11-28 DIAGNOSIS — M542 Cervicalgia: Secondary | ICD-10-CM | POA: Diagnosis not present

## 2019-12-14 ENCOUNTER — Encounter: Payer: Self-pay | Admitting: Internal Medicine

## 2019-12-14 ENCOUNTER — Telehealth (INDEPENDENT_AMBULATORY_CARE_PROVIDER_SITE_OTHER): Payer: Medicare Other | Admitting: Internal Medicine

## 2019-12-14 VITALS — Temp 97.5°F | Ht 64.0 in | Wt 173.0 lb

## 2019-12-14 DIAGNOSIS — Z8709 Personal history of other diseases of the respiratory system: Secondary | ICD-10-CM | POA: Diagnosis not present

## 2019-12-14 DIAGNOSIS — J0191 Acute recurrent sinusitis, unspecified: Secondary | ICD-10-CM

## 2019-12-14 DIAGNOSIS — J069 Acute upper respiratory infection, unspecified: Secondary | ICD-10-CM

## 2019-12-14 DIAGNOSIS — Z2821 Immunization not carried out because of patient refusal: Secondary | ICD-10-CM | POA: Diagnosis not present

## 2019-12-14 MED ORDER — AMOXICILLIN-POT CLAVULANATE 875-125 MG PO TABS: 1.0000 | ORAL_TABLET | Freq: Two times a day (BID) | ORAL | 0 refills | Status: DC

## 2019-12-14 NOTE — Progress Notes (Signed)
Virtual Visit via Video Note  I connected with@ on 12/14/19 at  2:30 PM EDT by a video enabled telemedicine application and verified that I am speaking with the correct person using two identifiers. Location patient: home Location provider:work o office Persons participating in the virtual visit: patient, provider  [place] national recommendations  regarding COVID 19 pandemic   video visit is advised over in office visit for this patient.  Patient aware  of the limitations of evaluation and management by telemedicine and  availability of in person appointments. and agreed to proceed.   HPI: Michele Monroe presents for video visit w 2 days of  What she describes as one of her sinus infections with thick rhinorrhea  Cough and  Nose congestion  bifromtal pressure but not maxillary  Using antihistamine calritin mucinex as usual( says INCS never owkrked)  No exposures   No osb fever chills   ROS: See pertinent positives and negatives per HPI.  Past Medical History:  Diagnosis Date  . ALLERGIC RHINITIS 06/30/2010   perennial  . Allergy    seasonal  . Coughing up blood 08/13/2012   piece of tissue? will send to path poss from upper airway sinusitis  Final path was "abscess"   . GERD 06/30/2010  . Headache(784.0) 06/30/2010   migraines  . Hepatic steatosis   . HYPERLIPIDEMIA 06/30/2010  . HYPERTENSION 06/30/2010  . IBS 06/30/2010  . Internal hemorrhoids   . Renal lesion   . Varicose veins   . VITAMIN D DEFICIENCY 06/30/2010    Past Surgical History:  Procedure Laterality Date  . BREAST SURGERY     needle bx left breast  . COLONOSCOPY    . feet     surgery  . ROTATOR CUFF REPAIR Right   . varicose veins      Family History  Problem Relation Age of Onset  . Colon cancer Mother        age 66  . Breast cancer Mother   . Arthritis Brother   . Other Brother   . Breast cancer Cousin     Social History   Tobacco Use  . Smoking status: Former Smoker    Types: Cigarettes   Quit date: 09/06/1976    Years since quitting: 43.2  . Smokeless tobacco: Never Used  Substance Use Topics  . Alcohol use: No    Alcohol/week: 0.0 standard drinks    Comment: rarely  . Drug use: No      Current Outpatient Medications:  .  albuterol (VENTOLIN HFA) 108 (90 Base) MCG/ACT inhaler, Inhale 2 puffs by mouth every 6 hours as needed for wheezing, Disp: 18 g, Rfl: 3 .  atenolol (TENORMIN) 25 MG tablet, Take 1 tablet (25 mg total) by mouth daily., Disp: 90 tablet, Rfl: 0 .  atorvastatin (LIPITOR) 20 MG tablet, Take 1 tablet (20 mg total) by mouth daily., Disp: 90 tablet, Rfl: 0 .  dicyclomine (BENTYL) 20 MG tablet, Take 1 tablet (20 mg total) by mouth daily. As needed, Disp: 30 tablet, Rfl: 2 .  gabapentin (NEURONTIN) 100 MG capsule, TAKE 1 CAPSULE AT BEDTIME FOR 1 WEEK, THEN 1 CAPSULE TWICE DAILY FOR 1 WEEK, THEN 2 CAPSULES TWICE DAILY FOR 1 WEEK, THEN 3 CAPSULES TWICE DAILY (Patient taking differently: Take 100 mg by mouth at bedtime. Take 1 capsule at bedtime), Disp: 180 capsule, Rfl: 0 .  MEDROL 4 MG TBPK tablet, Take      dosepack as directed on package, Disp: , Rfl:  .  Multiple Vitamins-Minerals (MULTIVITAMIN ADULT PO), Take by mouth., Disp: , Rfl:  .  omeprazole (PRILOSEC) 20 MG capsule, Take 20 mg by mouth daily., Disp: , Rfl:  .  rizatriptan (MAXALT) 10 MG tablet, Take 1 tablet at the onset of migraine, may repeat in 2 hours. Max of 2 doses per day, Disp: 11 tablet, Rfl: 11 .  tiZANidine (ZANAFLEX) 4 MG tablet, Take 1/2 to 1 tablet at bedtime for neck pain, Disp: 90 tablet, Rfl: 0 .  traMADol (ULTRAM) 50 MG tablet, Take 1 tablet by mouth every 6 hours as needed, Disp: 15 tablet, Rfl: 2 .  triamcinolone acetonide (KENALOG-40) 40 MG/ML injection (RADIOLOGY ONLY), Inject into the articular space., Disp: , Rfl:  .  amoxicillin-clavulanate (AUGMENTIN) 875-125 MG tablet, Take 1 tablet by mouth every 12 (twelve) hours. For sinusitis, Disp: 14 tablet, Rfl: 0  EXAM: BP Readings from  Last 3 Encounters:  05/02/19 122/70  04/24/19 110/70  07/04/18 138/80    VITALS per patient if applicable:  GENERAL: alert, oriented, appears well and in no acute distress midl mod congestion cough but non toxic   HEENT: atraumatic, conjunttiva clear, no obvious abnormalities on inspection of external nose and ears  NECK: normal movements of the head and neck  LUNGS: on inspection no signs of respiratory distress, breathing rate appears normal, no obvious gross SOB, gasping or wheezing  CV: no obvious cyanosis  MS: moves all visible extremities without noticeable abnormality  PSYCH/NEURO: pleasant and cooperative, no obvious depression or anxiety, speech and thought processing grossly intact Lab Results  Component Value Date   WBC 8.5 05/02/2019   HGB 13.1 05/02/2019   HCT 38.6 05/02/2019   PLT 229.0 05/02/2019   GLUCOSE 94 05/02/2019   CHOL 168 05/02/2019   TRIG 182.0 (H) 05/02/2019   HDL 53.90 05/02/2019   LDLCALC 77 05/02/2019   ALT 15 05/02/2019   AST 18 05/02/2019   NA 139 05/02/2019   K 3.8 05/02/2019   CL 104 05/02/2019   CREATININE 0.77 05/02/2019   BUN 17 05/02/2019   CO2 26 05/02/2019   TSH 1.59 05/02/2019    ASSESSMENT AND PLAN:  Discussed the following assessment and plan:    ICD-10-CM   1. Acute recurrent sinusitis, unspecified location  J01.91   2. URI, acute  J06.9   3. History of sinusitis  Z87.09   4. COVID-19 virus vaccination declined  Z28.21    Poss viral infection  But weekend and hx of bacterial issues antibiotic rxed Augmentin and advise wait to use if needed   Disc covid vaccine feels  people have died from he vaccine  And doesn't trust reg new sites   Disc that association is not cause and how stats are analyzed   Told to keep an open mind  Risk of disease is higher than risk of vaccine Counseled.   Expectant management and discussion of plan and treatment with opportunity to ask questions and all were answered. The patient agreed with  the plan and demonstrated an understanding of the instructions.   Advised to call back or seek an in-person evaluation if worsening  or having  further concerns . Return if symptoms worsen or fail to improve, for expectant managment. 30 minutes review prev records dx and fu and counseling about vaccine and dx    Shanon Ace, MD

## 2019-12-17 ENCOUNTER — Other Ambulatory Visit: Payer: Self-pay | Admitting: Neurology

## 2019-12-28 ENCOUNTER — Encounter: Payer: Self-pay | Admitting: Internal Medicine

## 2019-12-28 ENCOUNTER — Other Ambulatory Visit: Payer: Self-pay

## 2019-12-28 ENCOUNTER — Telehealth: Payer: Self-pay | Admitting: Internal Medicine

## 2019-12-28 ENCOUNTER — Telehealth (INDEPENDENT_AMBULATORY_CARE_PROVIDER_SITE_OTHER): Payer: Medicare Other | Admitting: Internal Medicine

## 2019-12-28 VITALS — Temp 97.0°F | Ht 64.0 in | Wt 173.0 lb

## 2019-12-28 DIAGNOSIS — Z8709 Personal history of other diseases of the respiratory system: Secondary | ICD-10-CM | POA: Diagnosis not present

## 2019-12-28 DIAGNOSIS — J029 Acute pharyngitis, unspecified: Secondary | ICD-10-CM | POA: Diagnosis not present

## 2019-12-28 DIAGNOSIS — J40 Bronchitis, not specified as acute or chronic: Secondary | ICD-10-CM | POA: Diagnosis not present

## 2019-12-28 DIAGNOSIS — R058 Other specified cough: Secondary | ICD-10-CM

## 2019-12-28 DIAGNOSIS — R05 Cough: Secondary | ICD-10-CM | POA: Diagnosis not present

## 2019-12-28 MED ORDER — PREDNISONE 20 MG PO TABS: 20.0000 mg | ORAL_TABLET | Freq: Two times a day (BID) | ORAL | 0 refills | Status: AC

## 2019-12-28 MED ORDER — DOXYCYCLINE HYCLATE 100 MG PO TABS: 100.0000 mg | ORAL_TABLET | Freq: Two times a day (BID) | ORAL | 0 refills | Status: AC

## 2019-12-28 NOTE — Telephone Encounter (Signed)
Last OV 12/14/2019 (Virtual Visit)

## 2019-12-28 NOTE — Telephone Encounter (Signed)
Called patient and scheduled her for a virtual visit today at 2:30pm with Dr. Regis Bill. Patient verbalized an understanding.

## 2019-12-28 NOTE — Progress Notes (Signed)
Virtual Visit via Video Note  I connected with@ on 12/28/19 at  2:30 PM EDT by a video enabled telemedicine application and verified that I am speaking with the correct person using two identifiers. Location patient: home Location provider:work office Persons participating in the virtual visit: patient, provider  [place] national recommendations  regarding COVID 19 pandemic   video visit is advised over in office visit for this patient.  Patient aware  of the limitations of evaluation and management by telemedicine and  availability of in person appointments. and agreed to proceed.   HPI: Michele Monroe presents for video visit concerned about sore throat and requesting  Refill antibiotic  Lat visit 4 9 Augmentin  Was better in 2 days   Quickly    Now awoke with sx   Sore throat mild  Yellow nasal discharge and deeper cough using  Inhaler but no sob  Using otc meds  No hemoptysis  Fever  But has  fatigue   And headache  Denies covid exposures    No vaccine .   ROS: See pertinent positives and negatives per HPI.  Past Medical History:  Diagnosis Date  . ALLERGIC RHINITIS 06/30/2010   perennial  . Allergy    seasonal  . Coughing up blood 08/13/2012   piece of tissue? will send to path poss from upper airway sinusitis  Final path was "abscess"   . GERD 06/30/2010  . Headache(784.0) 06/30/2010   migraines  . Hepatic steatosis   . HYPERLIPIDEMIA 06/30/2010  . HYPERTENSION 06/30/2010  . IBS 06/30/2010  . Internal hemorrhoids   . Renal lesion   . Varicose veins   . VITAMIN D DEFICIENCY 06/30/2010    Past Surgical History:  Procedure Laterality Date  . BREAST SURGERY     needle bx left breast  . COLONOSCOPY    . feet     surgery  . ROTATOR CUFF REPAIR Right   . varicose veins      Family History  Problem Relation Age of Onset  . Colon cancer Mother        age 37  . Breast cancer Mother   . Arthritis Brother   . Other Brother   . Breast cancer Cousin     Social  History   Tobacco Use  . Smoking status: Former Smoker    Types: Cigarettes    Quit date: 09/06/1976    Years since quitting: 43.3  . Smokeless tobacco: Never Used  Substance Use Topics  . Alcohol use: No    Alcohol/week: 0.0 standard drinks    Comment: rarely  . Drug use: No      Current Outpatient Medications:  .  albuterol (VENTOLIN HFA) 108 (90 Base) MCG/ACT inhaler, Inhale 2 puffs by mouth every 6 hours as needed for wheezing, Disp: 18 g, Rfl: 3 .  atenolol (TENORMIN) 25 MG tablet, Take 1 tablet (25 mg total) by mouth daily., Disp: 90 tablet, Rfl: 0 .  atorvastatin (LIPITOR) 20 MG tablet, Take 1 tablet (20 mg total) by mouth daily., Disp: 90 tablet, Rfl: 0 .  dicyclomine (BENTYL) 20 MG tablet, Take 1 tablet (20 mg total) by mouth daily. As needed, Disp: 30 tablet, Rfl: 2 .  gabapentin (NEURONTIN) 100 MG capsule, TAKE 1 CAPSULE AT BEDTIME FOR 1 WEEK, THEN 1 CAPSULE TWICE DAILY FOR 1 WEEK, THEN 2 CAPSULES TWICE DAILY FOR 1 WEEK, THEN 3 CAPSULES TWICE DAILY, Disp: 180 capsule, Rfl: 0 .  Multiple Vitamins-Minerals (MULTIVITAMIN ADULT PO), Take  by mouth., Disp: , Rfl:  .  omeprazole (PRILOSEC) 20 MG capsule, Take 20 mg by mouth daily., Disp: , Rfl:  .  rizatriptan (MAXALT) 10 MG tablet, Take 1 tablet at the onset of migraine, may repeat in 2 hours. Max of 2 doses per day, Disp: 11 tablet, Rfl: 11 .  tiZANidine (ZANAFLEX) 4 MG tablet, Take 1/2 to 1 tablet at bedtime for neck pain, Disp: 90 tablet, Rfl: 0 .  traMADol (ULTRAM) 50 MG tablet, Take 1 tablet by mouth every 6 hours as needed, Disp: 15 tablet, Rfl: 2 .  triamcinolone acetonide (KENALOG-40) 40 MG/ML injection (RADIOLOGY ONLY), Inject into the articular space., Disp: , Rfl:  .  doxycycline (VIBRA-TABS) 100 MG tablet, Take 1 tablet (100 mg total) by mouth 2 (two) times daily for 5 days., Disp: 10 tablet, Rfl: 0 .  MEDROL 4 MG TBPK tablet, Take      dosepack as directed on package, Disp: , Rfl:  .  predniSONE (DELTASONE) 20 MG  tablet, Take 1 tablet (20 mg total) by mouth 2 (two) times daily with a meal for 5 days., Disp: 10 tablet, Rfl: 0  EXAM: BP Readings from Last 3 Encounters:  05/02/19 122/70  04/24/19 110/70  07/04/18 138/80    VITALS per patient if applicable:  GENERAL: alert, oriented, appears well and in no acute distress  congested and has bronchial cough  No resp distress   HEENT: atraumatic, conjunttiva clear, no obvious abnormalities on inspection of external nose and ears  NECK: normal movements of the head and neck  LUNGS: on inspection no signs of respiratory distress, breathing rate appears normal, no obvious gross SOB, gasping or wheezing ocass cough and hoarse   CV: no obvious cyanosis  MS: moves all visible extremities without noticeable abnormality  PSYCH/NEURO: pleasant and cooperative, no obvious depression or anxiety, speech and thought processing grossly intact  ASSESSMENT AND PLAN:  Discussed the following assessment and plan:    ICD-10-CM   1. Respiratory tract congestion with cough  R05   2. Bronchitis  J40   3. Sore throat  J02.9   4. History of sinusitis  [id number]    Uncertain causes of so frequent rep sx  May have underlying allergy  Has  Had sig sinus disease in past but this seems more lower resp today but no alarm sx   Last ur sx sinus sx resolved in 2 days on antibiotic  This is  Weekend and although reluctant to re use antibiotic but over weekend    Will do doxy  And prednisone for 5 days and then  Send in message about status next week   Seems to be having recurrent sx  Out side of realm of what is in community and  Exposures hx  Still advise her to get a covid vaccine  consider referral if recurring  persistent or progressive  Counseled.   Expectant management and discussion of plan and treatment with opportunity to ask questions and all were answered. The patient agreed with the plan and demonstrated an understanding of the instructions.   Advised to call back  or seek an in-person evaluation if worsening  or having  further concerns . Return for send [place] status update next week .  Shanon Ace, MD

## 2019-12-28 NOTE — Telephone Encounter (Signed)
per patient call, was treated for a viral infection previously .  Dr. Regis Bill prescribe antibiotic - amoxicillin-clavulanate (AUGMENTIN) 875-125 MG tablet/ Pt says she has a severe sore throat now  want to know if she could have a refill on the medication  [id number] [place] [place]  Phone:  [phone] Fax:  [phone]  pt contact number  [phone]

## 2019-12-28 NOTE — Telephone Encounter (Signed)
Not enough  Info  To make judgment   sore throats are not usually better with antibiotics   Usually take 3-5  days to get better  If no fever  Can offer virtual visit to assess

## 2019-12-31 ENCOUNTER — Other Ambulatory Visit: Payer: Self-pay | Admitting: Neurology

## 2019-12-31 ENCOUNTER — Telehealth: Payer: Self-pay | Admitting: Internal Medicine

## 2019-12-31 ENCOUNTER — Other Ambulatory Visit: Payer: Self-pay | Admitting: Internal Medicine

## 2019-12-31 NOTE — Telephone Encounter (Signed)
Please see message. °

## 2019-12-31 NOTE — Telephone Encounter (Signed)
Pt is calling in stating that the doxycycline 100 MG and prednisone 20 MG is not working for her sinus infection she is coughing up and blowing out yellow stuff and would like to see if she can get amoxicillin-clavulanate 875-125 MG so that is will knock it out and it does not turn in to pneumonia.  [facility] [address] [place]

## 2020-01-01 ENCOUNTER — Other Ambulatory Visit: Payer: Self-pay

## 2020-01-01 MED ORDER — AMOXICILLIN-POT CLAVULANATE 875-125 MG PO TABS: 1.0000 | ORAL_TABLET | Freq: Two times a day (BID) | ORAL | 0 refills | Status: AC

## 2020-01-01 NOTE — Telephone Encounter (Signed)
Called patient and gave her the message from Dr. Regis Bill. Patient verbalized an understanding. I [facility].

## 2020-01-01 NOTE — Telephone Encounter (Signed)
See other response ok

## 2020-01-01 NOTE — Telephone Encounter (Signed)
See other message

## 2020-01-01 NOTE — Telephone Encounter (Signed)
This could be a viral infection  And antibiotics wont work for that    Get tested again for covid?   Send in Augmentin 875 1 po bid for 5 days   If not getting better should be seen at resp clinic or UC etc.

## 2020-01-02 ENCOUNTER — Ambulatory Visit: Payer: Medicare Other | Attending: Internal Medicine

## 2020-01-02 DIAGNOSIS — Z20822 Contact with and (suspected) exposure to covid-19: Secondary | ICD-10-CM

## 2020-01-03 LAB — NOVEL CORONAVIRUS, NAA: SARS-CoV-2, NAA: NOT DETECTED

## 2020-01-03 LAB — SARS-COV-2, NAA 2 DAY TAT

## 2020-01-12 ENCOUNTER — Other Ambulatory Visit: Payer: Self-pay | Admitting: Internal Medicine

## 2020-01-29 DIAGNOSIS — M5442 Lumbago with sciatica, left side: Secondary | ICD-10-CM | POA: Diagnosis not present

## 2020-01-29 DIAGNOSIS — M9903 Segmental and somatic dysfunction of lumbar region: Secondary | ICD-10-CM | POA: Diagnosis not present

## 2020-01-29 DIAGNOSIS — M546 Pain in thoracic spine: Secondary | ICD-10-CM | POA: Diagnosis not present

## 2020-01-29 DIAGNOSIS — M9902 Segmental and somatic dysfunction of thoracic region: Secondary | ICD-10-CM | POA: Diagnosis not present

## 2020-01-30 DIAGNOSIS — M9902 Segmental and somatic dysfunction of thoracic region: Secondary | ICD-10-CM | POA: Diagnosis not present

## 2020-01-30 DIAGNOSIS — M9903 Segmental and somatic dysfunction of lumbar region: Secondary | ICD-10-CM | POA: Diagnosis not present

## 2020-01-30 DIAGNOSIS — M546 Pain in thoracic spine: Secondary | ICD-10-CM | POA: Diagnosis not present

## 2020-01-30 DIAGNOSIS — M5442 Lumbago with sciatica, left side: Secondary | ICD-10-CM | POA: Diagnosis not present

## 2020-01-31 DIAGNOSIS — M9903 Segmental and somatic dysfunction of lumbar region: Secondary | ICD-10-CM | POA: Diagnosis not present

## 2020-01-31 DIAGNOSIS — M546 Pain in thoracic spine: Secondary | ICD-10-CM | POA: Diagnosis not present

## 2020-01-31 DIAGNOSIS — M5442 Lumbago with sciatica, left side: Secondary | ICD-10-CM | POA: Diagnosis not present

## 2020-01-31 DIAGNOSIS — M9902 Segmental and somatic dysfunction of thoracic region: Secondary | ICD-10-CM | POA: Diagnosis not present

## 2020-02-05 DIAGNOSIS — M9903 Segmental and somatic dysfunction of lumbar region: Secondary | ICD-10-CM | POA: Diagnosis not present

## 2020-02-05 DIAGNOSIS — M5442 Lumbago with sciatica, left side: Secondary | ICD-10-CM | POA: Diagnosis not present

## 2020-02-05 DIAGNOSIS — M546 Pain in thoracic spine: Secondary | ICD-10-CM | POA: Diagnosis not present

## 2020-02-05 DIAGNOSIS — M9902 Segmental and somatic dysfunction of thoracic region: Secondary | ICD-10-CM | POA: Diagnosis not present

## 2020-02-06 DIAGNOSIS — M546 Pain in thoracic spine: Secondary | ICD-10-CM | POA: Diagnosis not present

## 2020-02-06 DIAGNOSIS — M9902 Segmental and somatic dysfunction of thoracic region: Secondary | ICD-10-CM | POA: Diagnosis not present

## 2020-02-06 DIAGNOSIS — M9903 Segmental and somatic dysfunction of lumbar region: Secondary | ICD-10-CM | POA: Diagnosis not present

## 2020-02-06 DIAGNOSIS — M5442 Lumbago with sciatica, left side: Secondary | ICD-10-CM | POA: Diagnosis not present

## 2020-02-08 ENCOUNTER — Other Ambulatory Visit: Payer: Self-pay | Admitting: Internal Medicine

## 2020-02-11 DIAGNOSIS — M9903 Segmental and somatic dysfunction of lumbar region: Secondary | ICD-10-CM | POA: Diagnosis not present

## 2020-02-11 DIAGNOSIS — M546 Pain in thoracic spine: Secondary | ICD-10-CM | POA: Diagnosis not present

## 2020-02-11 DIAGNOSIS — M5442 Lumbago with sciatica, left side: Secondary | ICD-10-CM | POA: Diagnosis not present

## 2020-02-11 DIAGNOSIS — M9902 Segmental and somatic dysfunction of thoracic region: Secondary | ICD-10-CM | POA: Diagnosis not present

## 2020-02-18 DIAGNOSIS — M9903 Segmental and somatic dysfunction of lumbar region: Secondary | ICD-10-CM | POA: Diagnosis not present

## 2020-02-18 DIAGNOSIS — M546 Pain in thoracic spine: Secondary | ICD-10-CM | POA: Diagnosis not present

## 2020-02-18 DIAGNOSIS — M5442 Lumbago with sciatica, left side: Secondary | ICD-10-CM | POA: Diagnosis not present

## 2020-02-18 DIAGNOSIS — M9902 Segmental and somatic dysfunction of thoracic region: Secondary | ICD-10-CM | POA: Diagnosis not present

## 2020-02-22 DIAGNOSIS — M546 Pain in thoracic spine: Secondary | ICD-10-CM | POA: Diagnosis not present

## 2020-02-22 DIAGNOSIS — M5442 Lumbago with sciatica, left side: Secondary | ICD-10-CM | POA: Diagnosis not present

## 2020-02-22 DIAGNOSIS — M9902 Segmental and somatic dysfunction of thoracic region: Secondary | ICD-10-CM | POA: Diagnosis not present

## 2020-02-22 DIAGNOSIS — M9903 Segmental and somatic dysfunction of lumbar region: Secondary | ICD-10-CM | POA: Diagnosis not present

## 2020-02-26 NOTE — Progress Notes (Signed)
Virtual Visit via Video Note The purpose of this virtual visit is to provide medical care while limiting exposure to the novel coronavirus.    Consent was obtained for video visit:  Yes.   Answered questions that patient had about telehealth interaction:  Yes.   I discussed the limitations, risks, security and privacy concerns of performing an evaluation and management service by telemedicine. I also discussed with the patient that there may be a patient responsible charge related to this service. The patient expressed understanding and agreed to proceed.  Pt location: Home Physician Location: office Name of referring provider:  Burnis Medin, MD I connected with [name] at patients initiation/request on 02/28/2020 at  9:50 AM EDT by video enabled telemedicine application and verified that I am speaking with the correct person using two identifiers. Pt MRN:  [id number] Pt DOB:  [birth year] Video Participants:  [name]   History of Present Illness:  Michele Monroe is a 74year old right-handed female with hypertension, GERD, IBS, hyperlipidemia who follows up for migraines.  UPDATE: Intensity:  severe Duration:  1 hour Frequency:  4 in past 30 days Abortive therapy: Rizatriptan 10mg for migraines, tramadol for abortive neck and shoulder pain. Muscle relaxant: Tizanidine 4mg  at bedtime Antihypertensive: Atenolol 25mg  daily Antiepileptic:  Gabapentin 100mg  at bedtime (higher doses caused adverse reaction)  HISTORY: Onset: 74 years old Location: Bilateral occipital region Quality: throbbing Initial intensity: Starts as 3/10 and progresses to 10/10 She has chronic neck pain that is worse with neck movement.Migraines often preceded by flare up of neck and shoulder pain. Aura: no Prodrome: no Postdrome: nausea Associated symptoms: Sometimes nauseaand vomiting. Photophobia and phonophobia. No visual disturbance.. She has not had any new worse headache of her  life, waking up from sleep InitialDuration: Several days without treatment (30 minutes with Maxalt) InitialFrequency: 2 to 3 days per week InitialFrequency of abortive medication: 2 days per week Triggers/exacerbating factors: Neck movement, change in barometric pressure Relieving factors: None Activity: Aggravates. Needs to lay down  Past NSAIDS: Ibuprofen, Aleve, Excedrin Past analgesics: Excedrin, Tylenol, tramadol Past abortive triptans: no Past muscle relaxants: Flexeril Past anti-emetic: Yes but uncertain of type Past antihypertensive medications: no Past antidepressant medications: Unsure. Not amitriptyline, nortriptyline or Effexor Past anticonvulsant medications: topiramate (ineffective) Past vitamins/Herbal/Supplements: no Other past therapies: heat and ice, exercises, massagefor neck pain  Family history of headache: Daughter has migraines  Report of an old CT of the head from 06/24/05 (images not currently available for review) demonstrated two areas of low density in the inferior left basal gangliafelt to be more likely prominent perivascular spaces rather than old lacunar infarcts. CT of cervical spine on the same day demonstrated multilevel degenerative changes. MRI of cervical spine from 06/06/17 was personally reviewed and demonstrated multilevel spondylolisthesis with cervical facet arthropathy with severe neural foraminal stenosis at bilateral C6 and left C7.She was evaluated by interventional pain management who told her nothing interventional was warranted as it was all arthritic changes.  She denies radicular pain down the arms but notes that she wakes up with bilateral hand numbness, which resolves when she shakes her hands out.  PAST MEDICAL HISTORY: Past Medical History:  Diagnosis Date   ALLERGIC RHINITIS 06/30/2010   perennial   Allergy    seasonal   Coughing up blood 08/13/2012   piece of tissue? will send to path poss from  upper airway sinusitis  Final path was "abscess"    GERD 06/30/2010   Headache(784.0) 06/30/2010  migraines   Hepatic steatosis    HYPERLIPIDEMIA 06/30/2010   HYPERTENSION 06/30/2010   IBS 06/30/2010   Internal hemorrhoids    Renal lesion    Varicose veins    VITAMIN D DEFICIENCY 06/30/2010    MEDICATIONS: Current Outpatient Medications on File Prior to Visit  Medication Sig Dispense Refill   albuterol (VENTOLIN HFA) 108 (90 Base) MCG/ACT inhaler Inhale 2 puffs by mouth every 6 hours as needed 18 g 11   atenolol (TENORMIN) 25 MG tablet Take 1 tablet (25 mg total) by mouth daily. 90 tablet 0   atorvastatin (LIPITOR) 20 MG tablet Take 1 tablet (20 mg total) by mouth daily. 90 tablet 0   dicyclomine (BENTYL) 20 MG tablet Take 1 tablet (20 mg total) by mouth daily. As needed 30 tablet 2   gabapentin (NEURONTIN) 100 MG capsule TAKE 1 CAPSULE AT BEDTIME FOR 1 WEEK, THEN 1 CAPSULE TWICE DAILY FOR 1 WEEK, THEN 2 CAPSULES TWICE DAILY FOR 1 WEEK, THEN 3 CAPSULES TWICE DAILY 180 capsule 0   MEDROL 4 MG TBPK tablet Take      dosepack as directed on package     Multiple Vitamins-Minerals (MULTIVITAMIN ADULT PO) Take by mouth.     omeprazole (PRILOSEC) 20 MG capsule Take 20 mg by mouth daily.     rizatriptan (MAXALT) 10 MG tablet Take 1 tablet at the onset of migraine, may repeat in 2 hours. Max of 2 doses per day 11 tablet 11   tiZANidine (ZANAFLEX) 4 MG tablet Take 1/2 to 1 tablet at bedtime for neck pain 90 tablet 0   traMADol (ULTRAM) 50 MG tablet Take 1 tablet by mouth every 6 hours as needed 15 tablet 2   triamcinolone acetonide (KENALOG-40) 40 MG/ML injection (RADIOLOGY ONLY) Inject into the articular space.     No current facility-administered medications on file prior to visit.    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       Lives alone with her two small dogs in a one story home. 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       3 cups of caffinated coffee per day. Denies soda/tea   Social Determinants of Radio broadcast assistant Strain:    Difficulty of Paying Living Expenses:   Food Insecurity:    Worried About Charity fundraiser in the Last Year:    Arboriculturist in the Last Year:   Transportation Needs:    Film/video editor (Medical):    Lack of Transportation (Non-Medical):   Physical Activity:    Days of Exercise per Week:    Minutes of Exercise per Session:   Stress:    Feeling of Stress :   Social Connections:    Frequency of Communication with Friends and Family:    Frequency of Social Gatherings with Friends and Family:    Attends Religious Services:    Active Member of Clubs or Organizations:    Attends Music therapist:    Marital Status:   Intimate Partner  Violence:    Fear of Current or Ex-Partner:    Emotionally Abused:    Physically Abused:    Sexually Abused:     Objective: Height 5\' 5"  (1.651 m), weight 173 lb (78.5 kg).  Impression & Plan: Migraine without aura, without status migrainosus, not intractable   1.  For preventative management, gabapentin 100mg  at bedtime 2.  For abortive therapy, rizatriptan for migraine; tramadol for neck pain 3.  Limit use of pain relievers to no more than 2 days out of week to prevent risk of rebound or medication-overuse headache. 4.  Keep headache diary 5.  Exercise, hydration, caffeine cessation, sleep hygiene, monitor for and avoid triggers 6. Follow up 6 months.  Follow Up Instructions:    -I discussed the assessment and treatment plan with the patient. The patient was provided an opportunity to ask questions and all were answered. The patient agreed with the plan and demonstrated an understanding of the instructions.   The patient was advised to call back or seek an in-person evaluation if the symptoms worsen or if the condition fails to improve as anticipated.   Dudley Major, DO

## 2020-02-28 ENCOUNTER — Other Ambulatory Visit: Payer: Self-pay

## 2020-02-28 ENCOUNTER — Encounter: Payer: Self-pay | Admitting: Neurology

## 2020-02-28 ENCOUNTER — Telehealth (INDEPENDENT_AMBULATORY_CARE_PROVIDER_SITE_OTHER): Payer: Medicare Other | Admitting: Neurology

## 2020-02-28 VITALS — Ht 65.0 in | Wt 173.0 lb

## 2020-02-28 DIAGNOSIS — G43009 Migraine without aura, not intractable, without status migrainosus: Secondary | ICD-10-CM

## 2020-03-08 ENCOUNTER — Other Ambulatory Visit: Payer: Self-pay | Admitting: Neurology

## 2020-03-13 DIAGNOSIS — M5442 Lumbago with sciatica, left side: Secondary | ICD-10-CM | POA: Diagnosis not present

## 2020-03-13 DIAGNOSIS — M9902 Segmental and somatic dysfunction of thoracic region: Secondary | ICD-10-CM | POA: Diagnosis not present

## 2020-03-13 DIAGNOSIS — M546 Pain in thoracic spine: Secondary | ICD-10-CM | POA: Diagnosis not present

## 2020-03-13 DIAGNOSIS — M9903 Segmental and somatic dysfunction of lumbar region: Secondary | ICD-10-CM | POA: Diagnosis not present

## 2020-03-19 DIAGNOSIS — M9902 Segmental and somatic dysfunction of thoracic region: Secondary | ICD-10-CM | POA: Diagnosis not present

## 2020-03-19 DIAGNOSIS — M5442 Lumbago with sciatica, left side: Secondary | ICD-10-CM | POA: Diagnosis not present

## 2020-03-19 DIAGNOSIS — M546 Pain in thoracic spine: Secondary | ICD-10-CM | POA: Diagnosis not present

## 2020-03-19 DIAGNOSIS — M9903 Segmental and somatic dysfunction of lumbar region: Secondary | ICD-10-CM | POA: Diagnosis not present

## 2020-04-22 ENCOUNTER — Telehealth: Payer: Self-pay | Admitting: Physical Medicine and Rehabilitation

## 2020-04-22 NOTE — Telephone Encounter (Signed)
Patient called. She would like an appointment with Dr. Ernestina Patches. Her call back number is [phone]

## 2020-04-23 NOTE — Telephone Encounter (Signed)
Self referral - scheduled for new patient OV for neck pain.

## 2020-04-24 ENCOUNTER — Other Ambulatory Visit: Payer: Self-pay | Admitting: Internal Medicine

## 2020-04-24 NOTE — Telephone Encounter (Signed)
Refer to Dr Tomi Likens  For refills

## 2020-04-24 NOTE — Telephone Encounter (Signed)
Patient has been getting this medication from Dr. Metta Clines, DO from [facility], filled last time by you. This was filled by you for one 90 day refill only.  Are you taking over this medication or does this need to go to Dr. Metta Clines?

## 2020-05-07 ENCOUNTER — Ambulatory Visit: Payer: Medicare Other | Admitting: Physical Medicine and Rehabilitation

## 2020-05-07 ENCOUNTER — Encounter: Payer: Self-pay | Admitting: Physical Medicine and Rehabilitation

## 2020-05-07 ENCOUNTER — Other Ambulatory Visit: Payer: Self-pay

## 2020-05-07 ENCOUNTER — Telehealth: Payer: Self-pay | Admitting: Physical Medicine and Rehabilitation

## 2020-05-07 VITALS — BP 129/82 | HR 79

## 2020-05-07 DIAGNOSIS — M7918 Myalgia, other site: Secondary | ICD-10-CM | POA: Diagnosis not present

## 2020-05-07 DIAGNOSIS — R202 Paresthesia of skin: Secondary | ICD-10-CM | POA: Diagnosis not present

## 2020-05-07 DIAGNOSIS — G894 Chronic pain syndrome: Secondary | ICD-10-CM

## 2020-05-07 DIAGNOSIS — M542 Cervicalgia: Secondary | ICD-10-CM | POA: Diagnosis not present

## 2020-05-07 DIAGNOSIS — M5412 Radiculopathy, cervical region: Secondary | ICD-10-CM | POA: Diagnosis not present

## 2020-05-07 DIAGNOSIS — M4802 Spinal stenosis, cervical region: Secondary | ICD-10-CM

## 2020-05-07 NOTE — Telephone Encounter (Signed)
Needa PA for [phone]. Scheduled for 9/20.

## 2020-05-07 NOTE — Progress Notes (Signed)
Right sided neck pain. Pain radiates down right arm to finger. Numbness in fingers. Sometimes has difficulty raising arm to brush hair. Numeric Pain Rating Scale and Functional Assessment Average Pain 8 Pain Right Now 8 My pain is intermittent, tingling and aching Pain is worse with: some activites Pain improves with: nothing   In the last MONTH (on 0-10 scale) has pain interfered with the following?  1. General activity like being  able to carry out your everyday physical activities such as walking, climbing stairs, carrying groceries, or moving a chair?  Rating(9)  2. Relation with others like being able to carry out your usual social activities and roles such as  activities at home, at work and in your community. Rating(9)  3. Enjoyment of life such that you have  been bothered by emotional problems such as feeling anxious, depressed or irritable?  Rating(5)

## 2020-05-08 DIAGNOSIS — S70262A Insect bite (nonvenomous), left hip, initial encounter: Secondary | ICD-10-CM | POA: Diagnosis not present

## 2020-05-08 DIAGNOSIS — S80861A Insect bite (nonvenomous), right lower leg, initial encounter: Secondary | ICD-10-CM | POA: Diagnosis not present

## 2020-05-08 DIAGNOSIS — S20461A Insect bite (nonvenomous) of right back wall of thorax, initial encounter: Secondary | ICD-10-CM | POA: Diagnosis not present

## 2020-05-09 NOTE — Telephone Encounter (Signed)
Auth# [id number] No req for pt.

## 2020-05-13 ENCOUNTER — Other Ambulatory Visit: Payer: Self-pay | Admitting: Internal Medicine

## 2020-05-13 DIAGNOSIS — Z1231 Encounter for screening mammogram for malignant neoplasm of breast: Secondary | ICD-10-CM

## 2020-05-20 ENCOUNTER — Telehealth: Payer: Self-pay | Admitting: Internal Medicine

## 2020-05-20 ENCOUNTER — Other Ambulatory Visit: Payer: Self-pay

## 2020-05-20 MED ORDER — TIZANIDINE HCL 4 MG PO TABS: ORAL_TABLET | ORAL | 0 refills | Status: DC

## 2020-05-20 NOTE — Telephone Encounter (Signed)
Refill has been sent to requested pharmacy for patient.

## 2020-05-20 NOTE — Telephone Encounter (Signed)
tiZANidine (ZANAFLEX) 4 MG tablet  [facility] [place] [place] - [place] Phone:  [phone]  Fax:  [phone]

## 2020-05-21 ENCOUNTER — Ambulatory Visit: Payer: Medicare Other | Admitting: Physical Medicine and Rehabilitation

## 2020-05-21 NOTE — Progress Notes (Signed)
[name] - 74 y.o. female MRN [id number]  Date of birth: 1946/02/02  Office Visit Note: Visit Date: 05/07/2020 PCP: Burnis Medin, MD Referred by: Burnis Medin, MD  Subjective: Chief Complaint  Patient presents with  . Neck - Pain   HPI: Michele Monroe is a 74 y.o. female who comes in today As a self-referral for evaluation management of chronic worsening recalcitrant severe right-sided neck pain with referral into the right arm and hand.  She reports chronic neck shoulder and arm pain that has been ongoing really over the last 3 to 4 years but with significant worsening over the last year.  She describes this as right sided neck pain that refers down into the right arm to the fingers and somewhat of a nondermatomal fashion.  She denies any pain down the left arm.  She reports that she has some difficulty raising her arm to brush her hair.  She describes this is 8 out of 10 severe pain which is intermittent tingling and aching.  It is worse with some activities and she just does not find anything that seems to help.  She has noted overall weakness but no focal weakness.  She does not really endorse dropping objects.  She has no prior history of carpal tunnel syndrome or carpal tunnel release.  No prior history of cervical surgery or interventional spine procedures.  She does have a history of migraine headaches.  Her headaches and neck pain has been followed more recently by her primary care physician Dr. Regis Bill as well as her neurologist Dr. Metta Clines.  She has had MRI of the cervical spine and this is reviewed with her today with spine models and imaging and the report is reviewed below.  I did review reports from both her primary care physician and her neurologist.  There was also 1 note from Dr. Baron Hamper who treated her left shoulder with subacromial injection and this seemed to help her quite a bit.  I did read his notes in terms of orthopedic evaluation of her shoulder pain.  She reports  that with her left shoulder and her right side is really giving her most of the issues at this point.  She has been treated with Medrol Dosepaks and tramadol as well as gabapentin and nothing is really helping at this point.  She has had physical therapy in the past but not recently.  She has had no history of dry needling or trigger point therapy.  We spoke with her at length today about her condition and treatment and probably spent 45 minutes discussing cervical spine imaging and treatments as well as myofascial pain syndrome and treatments.  Review of Systems  Musculoskeletal: Positive for joint pain and neck pain.  Neurological: Positive for tingling.  All other systems reviewed and are negative.  Otherwise per HPI.  Assessment & Plan: Visit Diagnoses:  1. Cervical radiculopathy   2. Foraminal stenosis of cervical region   3. Cervicalgia   4. Paresthesia of skin   5. Myofascial pain syndrome   6. Chronic pain syndrome     Plan: Findings:  Chronic worsening severe several months of just severe intermittent pain in the right neck shoulder and arm with referral into the hand in a nondermatomal fashion.  She has had chronic neck and shoulder pain for many many years.  The pain is severe despite the use of medications and home exercises and time.  No specific injury.  MRI does show multilevel facet  arthropathy with by foraminal narrowing at C5-6 which could cause some of the symptoms.  She does have a radicular type component to this but she also has significant findings of focal trigger points that do reproduce some of her pain.  Referral pattern could be strictly myofascial pain referral pattern and I do see that at times even with paresthesia type issues.  She has not had electrodiagnostic study of the right arm.  Plan at this point is to have her come back for focused trigger point injections diagnostically to see how much relief she gets with that.  If she were to get a lot of relief with  trigger point injection I would encourage dry needling and physical and manual treatment from a good physical therapist.  Depending on that relief however would look at cervical epidural injection and we will go ahead and get her on the schedule for possible cervical epidural injection with preauthorization for that.  We talked about the risk etc. and she does want to proceed with this treatment plan.  I do not see anything overtly surgical although she does have by foraminal narrowing at C5-6 with a lot of arthritis and this could be amenable to cervical ACDF but hopefully we can get her ongoing relief without going to that route.  She does not want neck surgery if possible.    Meds & Orders: No orders of the defined types were placed in this encounter.  No orders of the defined types were placed in this encounter.   Follow-up: Return for Trigger point injections.   Procedures: No procedures performed  No notes on file   Clinical History: MRI CERVICAL SPINE WITHOUT CONTRAST  TECHNIQUE: Multiplanar, multisequence MR imaging of the cervical spine was performed. No intravenous contrast was administered.  COMPARISON:  Head and cervical spine CT 06/24/2005.  FINDINGS: Alignment: Chronic straightening of cervical lordosis. Subtle anterolisthesis of C3 on C4, C4 on C5, and C7 on T1.  Vertebrae: Degenerative endplate marrow signal changes primarily at C5-C6 and C6-C7. Background bone marrow signal is normal. No marrow edema or evidence of acute osseous abnormality.  Cord: Spinal cord signal is within normal limits at all visualized levels.  Posterior Fossa, vertebral arteries, paraspinal tissues: Negative visualized brain parenchyma. Preserved major vascular flow voids in the neck. Negative visible lung apices.  Disc levels:  C2-C3: Mild to moderate facet hypertrophy greater on the right. Mild ligament flavum hypertrophy. No spinal stenosis. Mild C3 foraminal stenosis greater  on the right.  C3-C4: Moderate to severe facet hypertrophy greater on the right. Mild ligament flavum hypertrophy. Mild disc bulge and endplate spurring. No spinal stenosis. Mild right C4 foraminal stenosis.  C4-C5: Moderate to severe facet hypertrophy greater on the left. Mild disc bulge and endplate spurring. No spinal stenosis. Mild to moderate left C5 foraminal stenosis.  C5-C6: Disc space loss. Bulky but mostly far lateral circumferential disc osteophyte complex with bilateral foraminal involvement. Moderate facet hypertrophy greater on the right. Broad-based posterior component of disc. Borderline to mild spinal stenosis. Severe bilateral C6 foraminal stenosis.  C6-C7: Disc space loss. Bulky but mostly far lateral and foraminal disc osteophyte complex. Broad-based posterior component. Moderate facet hypertrophy. Borderline to mild spinal stenosis. Severe left and moderate right C7 foraminal stenosis.  C7-T1: Moderate bilateral facet hypertrophy. Mild endplate spurring. No significant stenosis.  No upper thoracic spinal stenosis, despite disc space loss and broad-based posterior disc protrusion at T2-T3 (series 7, image 8).  IMPRESSION: 1. Multilevel mild cervical spondylolisthesis associated with  widespread moderate or severe cervical facet arthropathy. Can superimposed advanced chronic disc and endplate degeneration at C5-C6 and C6-C7. 2. Subsequent borderline to mild spinal stenosis at C5-C6 and C6-C7. And severe neural foraminal stenosis at the bilateral C6 and left C7 nerve levels.   Electronically Signed   By: Genevie Ann M.D.   On: 06/06/2017 13:42   She reports that she quit smoking about 43 years ago. Her smoking use included cigarettes. She has never used smokeless tobacco. No results for input(s): HGBA1C, LABURIC in the last 8760 hours.  Objective:  VS:  HT:    WT:   BMI:     BP:129/82  HR:79bpm  TEMP: ( )  RESP:  Physical Exam Vitals and  nursing note reviewed.  Constitutional:      General: She is not in acute distress.    Appearance: Normal appearance. She is not ill-appearing.  HENT:     Head: Normocephalic and atraumatic.     Right Ear: External ear normal.     Left Ear: External ear normal.  Eyes:     Extraocular Movements: Extraocular movements intact.  Cardiovascular:     Rate and Rhythm: Normal rate.     Pulses: Normal pulses.  Pulmonary:     Effort: Pulmonary effort is normal. No respiratory distress.  Abdominal:     General: Abdomen is flat. There is no distension.     Palpations: Abdomen is soft.  Musculoskeletal:     Cervical back: Neck supple. Tenderness present. No rigidity.     Right lower leg: No edema.     Left lower leg: No edema.     Comments: Patient has good strength in the upper extremities including 5 out of 5 strength in wrist extension long finger flexion and APB.  There is no atrophy of the hands intrinsically.  There is a negative Hoffmann's test.  Sensation is intact bilaterally there is some subjective paresthesia type feeling to light touch really in a nondermatomal fashion but could be C6 on the right.  She has mild shoulder impingement on the left and right but mostly on the left.  She has focal trigger points in the levator scapula rhomboid and infraspinatus on the right.   Lymphadenopathy:     Cervical: No cervical adenopathy.  Skin:    Findings: No erythema, lesion or rash.  Neurological:     General: No focal deficit present.     Mental Status: She is alert and oriented to person, place, and time.     Sensory: No sensory deficit.     Motor: No weakness or abnormal muscle tone.     Coordination: Coordination normal.  Psychiatric:        Mood and Affect: Mood normal.        Behavior: Behavior normal.     Ortho Exam  Imaging: No results found.  Past Medical/Family/Surgical/Social History: Medications & Allergies reviewed per EMR, new medications updated. Patient Active  Problem List   Diagnosis Date Noted  . Dizziness 05/05/2019  . Varicose veins with pain 03/06/2015  . Renal lesion 08/10/2014  . Recurrent headache 08/10/2014  . Hyperlipidemia 08/10/2014  . Essential hypertension 08/10/2014  . Neck pain on left side 02/08/2014  . Allergic rhinitis ? 02/08/2014  . Varicose veins of lower extremities with other complications 93/81/0175  . Vasomotor flushing 06/19/2013  . Varicose veins 08/13/2012  . Mild anemia 08/13/2012  . Recurrent acute sinusitis 08/13/2012  . Bilateral neck pain 08/13/2012  . Stress headaches 08/13/2012  .  Coughing up blood 08/13/2012  . Constipation 07/25/2012  . Subcutaneous nodule 10/06/2011  . VITAMIN D DEFICIENCY 06/30/2010  . HYPERLIPIDEMIA 06/30/2010  . HYPERTENSION 06/30/2010  . ALLERGIC RHINITIS 06/30/2010  . GERD 06/30/2010  . IBS 06/30/2010  . Headache 06/30/2010   Past Medical History:  Diagnosis Date  . ALLERGIC RHINITIS 06/30/2010   perennial  . Allergy    seasonal  . Coughing up blood 08/13/2012   piece of tissue? will send to path poss from upper airway sinusitis  Final path was "abscess"   . GERD 06/30/2010  . Headache(784.0) 06/30/2010   migraines  . Hepatic steatosis   . HYPERLIPIDEMIA 06/30/2010  . HYPERTENSION 06/30/2010  . IBS 06/30/2010  . Internal hemorrhoids   . Renal lesion   . Varicose veins   . VITAMIN D DEFICIENCY 06/30/2010   Family History  Problem Relation Age of Onset  . Colon cancer Mother        age 67  . Breast cancer Mother   . Arthritis Brother   . Other Brother   . Breast cancer Cousin    Past Surgical History:  Procedure Laterality Date  . BREAST SURGERY     needle bx left breast  . COLONOSCOPY    . feet     surgery  . ROTATOR CUFF REPAIR Right   . varicose veins     Social History   Occupational History  . 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 Smoking status: Former Smoker    Types: Cigarettes    Quit date:  09/06/1976    Years since quitting: 43.9  . Smokeless tobacco: Never Used  Vaping Use  . Vaping Use: Never used  Substance and Sexual Activity  . Alcohol use: No    Alcohol/week: 0.0 standard drinks    Comment: rarely  . Drug use: No  . Sexual activity: Not on file

## 2020-05-26 ENCOUNTER — Encounter: Payer: Self-pay | Admitting: Physical Medicine and Rehabilitation

## 2020-05-26 ENCOUNTER — Ambulatory Visit (INDEPENDENT_AMBULATORY_CARE_PROVIDER_SITE_OTHER): Payer: Medicare Other | Admitting: Physical Medicine and Rehabilitation

## 2020-05-26 ENCOUNTER — Other Ambulatory Visit: Payer: Self-pay | Admitting: Internal Medicine

## 2020-05-26 ENCOUNTER — Ambulatory Visit: Payer: Self-pay

## 2020-05-26 ENCOUNTER — Other Ambulatory Visit: Payer: Self-pay

## 2020-05-26 VITALS — BP 133/87 | HR 84

## 2020-05-26 DIAGNOSIS — M5412 Radiculopathy, cervical region: Secondary | ICD-10-CM

## 2020-05-26 MED ORDER — METHYLPREDNISOLONE ACETATE 80 MG/ML IJ SUSP
80.0000 mg | Freq: Once | INTRAMUSCULAR | Status: AC
  Administered 2020-05-26: 80 mg

## 2020-05-26 NOTE — Progress Notes (Signed)
Pt states neck pain that travels down to her right arm down to her right finger. Pt state lifting her right arm makes the pain worse. Pt states cooking and cleaning makes the pain worse. Pt state pain meds helps a little, she use heat and icy to ease the pain.  Numeric Pain Rating Scale and Functional Assessment Average Pain 4   In the last MONTH (on 0-10 scale) has pain interfered with the following?  1. General activity like being  able to carry out your everyday physical activities such as walking, climbing stairs, carrying groceries, or moving a chair?  Rating(5)   +Driver, -BT, -Dye Allergies.

## 2020-05-26 NOTE — Procedures (Signed)
Cervical Epidural Steroid Injection - Interlaminar Approach with Fluoroscopic Guidance  Patient: Michele Monroe      Date of Birth: 01/23/1946 MRN: [id number] PCP: Burnis Medin, MD      Visit Date: 05/26/2020   Universal Protocol:    Date/Time: 09/20/218:24 AM  Consent Given By: the patient  Position: PRONE  Additional Comments: Vital signs were monitored before and after the procedure. Patient was prepped and draped in the usual sterile fashion. The correct patient, procedure, and site was verified.   Injection Procedure Details:  Procedure Site One Meds Administered:  Meds ordered this encounter  Medications  . methylPREDNISolone acetate (DEPO-MEDROL) injection 80 mg     Laterality: Right  Location/Site: C7-T1  Needle size: 20 G  Needle type: Touhy  Needle Placement: Paramedian epidural space  Findings:  -Comments: Excellent flow of contrast into the epidural space.  Procedure Details: Using a paramedian approach from the side mentioned above, the region overlying the inferior lamina was localized under fluoroscopic visualization and the soft tissues overlying this structure were infiltrated with 4 ml. of 1% Lidocaine without Epinephrine. A # 20 gauge, Tuohy needle was inserted into the epidural space using a paramedian approach.  The epidural space was localized using loss of resistance along with lateral and contralateral oblique bi-planar fluoroscopic views.  After negative aspirate for air, blood, and CSF, a 2 ml. volume of Isovue-250 was injected into the epidural space and the flow of contrast was observed. Radiographs were obtained for documentation purposes.   The injectate was administered into the level noted above.  Additional Comments:  The patient tolerated the procedure well Dressing: 2 x 2 sterile gauze and Band-Aid    Post-procedure details: Patient was observed during the procedure. Post-procedure instructions were reviewed.  Patient left  the clinic in stable condition.

## 2020-05-26 NOTE — Progress Notes (Signed)
Michele Monroe - 74 y.o. female MRN [id number]  Date of birth: Jan 02, 1946  Office Visit Note: Visit Date: 05/26/2020 PCP: Burnis Medin, MD Referred by: Burnis Medin, MD  Subjective: Chief Complaint  Patient presents with  . Neck - Pain  . Right Shoulder - Pain  . Right Hand - Pain   HPI:  Michele Monroe is a 74 y.o. female who comes in today at the request of Dr. Laurence Spates for planned Right C7-T1 Cervical epidural steroid injection with fluoroscopic guidance.  The patient has failed conservative care including home exercise, medications, time and activity modification.  This injection will be diagnostic and hopefully therapeutic.  Please see requesting physician notes for further details and justification.   ROS Otherwise per HPI.  Assessment & Plan: Visit Diagnoses:  1. Cervical radiculopathy     Plan: No additional findings.   Meds & Orders:  Meds ordered this encounter  Medications  . methylPREDNISolone acetate (DEPO-MEDROL) injection 80 mg    Orders Placed This Encounter  Procedures  . XR C-ARM NO REPORT  . Epidural Steroid injection    Follow-up: Return if symptoms worsen or fail to improve.   Procedures: No procedures performed  Cervical Epidural Steroid Injection - Interlaminar Approach with Fluoroscopic Guidance  Patient: Michele Monroe      Date of Birth: Aug 23, 1946 MRN: [id number] PCP: Burnis Medin, MD      Visit Date: 05/26/2020   Universal Protocol:    Date/Time: 09/20/218:24 AM  Consent Given By: the patient  Position: PRONE  Additional Comments: Vital signs were monitored before and after the procedure. Patient was prepped and draped in the usual sterile fashion. The correct patient, procedure, and site was verified.   Injection Procedure Details:  Procedure Site One Meds Administered:  Meds ordered this encounter  Medications  . methylPREDNISolone acetate (DEPO-MEDROL) injection 80 mg     Laterality: Right  Location/Site:  C7-T1  Needle size: 20 G  Needle type: Touhy  Needle Placement: Paramedian epidural space  Findings:  -Comments: Excellent flow of contrast into the epidural space.  Procedure Details: Using a paramedian approach from the side mentioned above, the region overlying the inferior lamina was localized under fluoroscopic visualization and the soft tissues overlying this structure were infiltrated with 4 ml. of 1% Lidocaine without Epinephrine. A # 20 gauge, Tuohy needle was inserted into the epidural space using a paramedian approach.  The epidural space was localized using loss of resistance along with lateral and contralateral oblique bi-planar fluoroscopic views.  After negative aspirate for air, blood, and CSF, a 2 ml. volume of Isovue-250 was injected into the epidural space and the flow of contrast was observed. Radiographs were obtained for documentation purposes.   The injectate was administered into the level noted above.  Additional Comments:  The patient tolerated the procedure well Dressing: 2 x 2 sterile gauze and Band-Aid    Post-procedure details: Patient was observed during the procedure. Post-procedure instructions were reviewed.  Patient left the clinic in stable condition.     Clinical History: MRI CERVICAL SPINE WITHOUT CONTRAST  TECHNIQUE: Multiplanar, multisequence MR imaging of the cervical spine was performed. No intravenous contrast was administered.  COMPARISON:  Head and cervical spine CT 06/24/2005.  FINDINGS: Alignment: Chronic straightening of cervical lordosis. Subtle anterolisthesis of C3 on C4, C4 on C5, and C7 on T1.  Vertebrae: Degenerative endplate marrow signal changes primarily at C5-C6 and C6-C7. Background bone marrow signal is normal. No marrow  edema or evidence of acute osseous abnormality.  Cord: Spinal cord signal is within normal limits at all visualized levels.  Posterior Fossa, vertebral arteries, paraspinal tissues:  Negative visualized brain parenchyma. Preserved major vascular flow voids in the neck. Negative visible lung apices.  Disc levels:  C2-C3: Mild to moderate facet hypertrophy greater on the right. Mild ligament flavum hypertrophy. No spinal stenosis. Mild C3 foraminal stenosis greater on the right.  C3-C4: Moderate to severe facet hypertrophy greater on the right. Mild ligament flavum hypertrophy. Mild disc bulge and endplate spurring. No spinal stenosis. Mild right C4 foraminal stenosis.  C4-C5: Moderate to severe facet hypertrophy greater on the left. Mild disc bulge and endplate spurring. No spinal stenosis. Mild to moderate left C5 foraminal stenosis.  C5-C6: Disc space loss. Bulky but mostly far lateral circumferential disc osteophyte complex with bilateral foraminal involvement. Moderate facet hypertrophy greater on the right. Broad-based posterior component of disc. Borderline to mild spinal stenosis. Severe bilateral C6 foraminal stenosis.  C6-C7: Disc space loss. Bulky but mostly far lateral and foraminal disc osteophyte complex. Broad-based posterior component. Moderate facet hypertrophy. Borderline to mild spinal stenosis. Severe left and moderate right C7 foraminal stenosis.  C7-T1: Moderate bilateral facet hypertrophy. Mild endplate spurring. No significant stenosis.  No upper thoracic spinal stenosis, despite disc space loss and broad-based posterior disc protrusion at T2-T3 (series 7, image 8).  IMPRESSION: 1. Multilevel mild cervical spondylolisthesis associated with widespread moderate or severe cervical facet arthropathy. Can superimposed advanced chronic disc and endplate degeneration at C5-C6 and C6-C7. 2. Subsequent borderline to mild spinal stenosis at C5-C6 and C6-C7. And severe neural foraminal stenosis at the bilateral C6 and left C7 nerve levels.   Electronically Signed   By: Genevie Ann M.D.   On: 06/06/2017 13:42     Objective:  VS:   HT:    WT:   BMI:     BP:133/87  HR:84bpm  TEMP: ( )  RESP:  Physical Exam Vitals and nursing note reviewed.  Constitutional:      General: She is not in acute distress.    Appearance: Normal appearance. She is not ill-appearing.  HENT:     Head: Normocephalic and atraumatic.     Right Ear: External ear normal.     Left Ear: External ear normal.  Eyes:     Extraocular Movements: Extraocular movements intact.  Cardiovascular:     Rate and Rhythm: Normal rate.     Pulses: Normal pulses.  Musculoskeletal:     Cervical back: Tenderness present. No rigidity.     Right lower leg: No edema.     Left lower leg: No edema.     Comments: Patient has good strength in the upper extremities including 5 out of 5 strength in wrist extension long finger flexion and APB.  There is no atrophy of the hands intrinsically.  There is a negative Hoffmann's test.   Lymphadenopathy:     Cervical: No cervical adenopathy.  Skin:    Findings: No erythema, lesion or rash.  Neurological:     General: No focal deficit present.     Mental Status: She is alert and oriented to person, place, and time.     Sensory: No sensory deficit.     Motor: No weakness or abnormal muscle tone.     Coordination: Coordination normal.  Psychiatric:        Mood and Affect: Mood normal.        Behavior: Behavior normal.  Imaging: No results found.

## 2020-06-05 ENCOUNTER — Ambulatory Visit: Payer: Medicare Other

## 2020-06-07 ENCOUNTER — Other Ambulatory Visit: Payer: Self-pay | Admitting: Neurology

## 2020-06-07 DIAGNOSIS — G43809 Other migraine, not intractable, without status migrainosus: Secondary | ICD-10-CM

## 2020-06-19 ENCOUNTER — Telehealth: Payer: Self-pay

## 2020-06-19 NOTE — Telephone Encounter (Signed)
Patient called in wanting to sch with dr Ernestina Patches for Cortizone injection

## 2020-06-20 ENCOUNTER — Other Ambulatory Visit: Payer: Self-pay

## 2020-06-20 ENCOUNTER — Ambulatory Visit
Admission: RE | Admit: 2020-06-20 | Discharge: 2020-06-20 | Disposition: A | Discharge status: Home / Self Care | Payer: Medicare Other | Source: Ambulatory Visit | Attending: Internal Medicine | Admitting: Internal Medicine

## 2020-06-20 DIAGNOSIS — Z1231 Encounter for screening mammogram for malignant neoplasm of breast: Secondary | ICD-10-CM | POA: Diagnosis not present

## 2020-06-20 NOTE — Telephone Encounter (Signed)
Scheduled for 11/1 at 1300 with driver and no blood thinners.

## 2020-06-20 NOTE — Telephone Encounter (Signed)
x1

## 2020-06-20 NOTE — Telephone Encounter (Signed)
Patient had a right C7-T1 IL on 9/20. She reports that the injection really helped, but the pain has returned. Ok to repeat?

## 2020-06-23 ENCOUNTER — Other Ambulatory Visit: Payer: Self-pay | Admitting: Neurology

## 2020-06-23 MED ORDER — OMEPRAZOLE 20 MG PO CPDR
20.0000 mg | DELAYED_RELEASE_CAPSULE | Freq: Every day | ORAL | 0 refills | Status: DC
Start: 2020-06-23 — End: 2020-07-10

## 2020-06-24 ENCOUNTER — Telehealth: Payer: Self-pay | Admitting: Neurology

## 2020-06-24 ENCOUNTER — Telehealth: Payer: Self-pay

## 2020-06-24 NOTE — Telephone Encounter (Signed)
Form came over by [employer], escribed confirmed fax complete

## 2020-06-24 NOTE — Telephone Encounter (Signed)
I shouldn't be refilling her omeprazole

## 2020-06-25 NOTE — Telephone Encounter (Signed)
Pt advised to call her pcp.

## 2020-07-02 ENCOUNTER — Telehealth: Payer: Self-pay | Admitting: Neurology

## 2020-07-02 NOTE — Telephone Encounter (Signed)
Pharmacy requesting clarification for Omeprazole medication for patient. They are requesting more refills put on file and clarification as to why only 10 pills.  Call [phone] Fax [phone]

## 2020-07-02 NOTE — Telephone Encounter (Signed)
Advised rep, Refills sent in so pt can get to her pcp to get refills

## 2020-07-04 ENCOUNTER — Other Ambulatory Visit: Payer: Self-pay | Admitting: Neurology

## 2020-07-07 ENCOUNTER — Telehealth: Payer: Self-pay | Admitting: Physical Medicine and Rehabilitation

## 2020-07-07 ENCOUNTER — Ambulatory Visit: Payer: Medicare Other | Admitting: Physical Medicine and Rehabilitation

## 2020-07-07 NOTE — Telephone Encounter (Signed)
Patient called and need to cancel appt. She is not felling well and will call and reschedule. Patient phone number is [phone]

## 2020-07-07 NOTE — Telephone Encounter (Signed)
Appointment canceled.

## 2020-07-08 ENCOUNTER — Other Ambulatory Visit: Payer: Self-pay | Admitting: Internal Medicine

## 2020-07-09 ENCOUNTER — Telehealth: Payer: Self-pay | Admitting: Neurology

## 2020-07-09 ENCOUNTER — Other Ambulatory Visit: Payer: Self-pay | Admitting: Internal Medicine

## 2020-07-09 NOTE — Telephone Encounter (Signed)
Inquiring on refill for patient -- Omeprazole 20 mg delayed release capsule

## 2020-07-10 MED ORDER — OMEPRAZOLE 20 MG PO CPDR
20.0000 mg | DELAYED_RELEASE_CAPSULE | Freq: Every day | ORAL | 0 refills | Status: DC
Start: 2020-07-10 — End: 2020-07-22

## 2020-07-10 NOTE — Telephone Encounter (Signed)
Rx sent 

## 2020-07-11 ENCOUNTER — Other Ambulatory Visit: Payer: Self-pay | Admitting: Internal Medicine

## 2020-07-18 ENCOUNTER — Other Ambulatory Visit: Payer: Self-pay | Admitting: Internal Medicine

## 2020-07-18 ENCOUNTER — Telehealth: Payer: Self-pay | Admitting: Internal Medicine

## 2020-07-18 NOTE — Telephone Encounter (Signed)
10 tablet of tiZANidine 4mg 

## 2020-07-18 NOTE — Telephone Encounter (Signed)
Pt call and stated she need refills on albuterol (VENTOLIN HFA) 108 (90 Base) MCG/ACT inhaler /atorvastatin (LIPITOR) 20 MG tablet/ atenolol (TENORMIN) 25 MG tablet /rizatriptan (MAXALT) 10 MG tablet and tiZANidine (ZANAFLEX) 4 MG tablet sent to  [facility] [place], [place] - [address] Phone:  [phone]  Fax:  [phone]    Pt stated she need 10 tablet of  sent to  [id number] [address] Phone:  [phone]  Fax:  [phone]    Because she can't get tizanidene until the 07/29/20.    v

## 2020-07-20 NOTE — Progress Notes (Signed)
Virtual Visit via Video Note The purpose of this virtual visit is to provide medical care while limiting exposure to the novel coronavirus.    Consent was obtained for video visit:  Yes Answered questions that patient had about telehealth interaction:  Yes I discussed the limitations, risks, security and privacy concerns of performing an evaluation and management service by telemedicine. I also discussed with the patient that there may be a patient responsible charge related to this service. The patient expressed understanding and agreed to proceed.  Pt location: Home Physician Location: office Name of referring provider:  Burnis Medin, MD I connected with [name] at patients initiation/request on 07/21/2020 at  9:50 AM EST by video enabled telemedicine application and verified that I am speaking with the correct person using two identifiers. Pt MRN:  [id number] Pt DOB:  12-07-1945 Video Participants:  [name]   History of Present Illness:  Michele Monroe is a 74year old right-handed female with hypertension, GERD, IBS, hyperlipidemia who follows up for migraines.  UPDATE: Last week she had two or three migraines but this past week, she has been doing well. Intensity:severe Duration: 1 hour Frequency: 4 in past 30 days Abortive therapy: Rizatriptan 10mg for migraines, tramadol for abortive neck and shoulder pain. Muscle relaxant: Tizanidine 4mg  at bedtime Antihypertensive: Atenolol 25mg  daily Antiepileptic: Gabapentin 300mg  at bedtime   HISTORY: Onset: 74 years old Location: Bilateral occipital region Quality: throbbing Initial intensity: Starts as 3/10 and progresses to 10/10 She has chronic neck pain that is worse with neck movement.Migraines often preceded by flare up of neck and shoulder pain. Aura: no Prodrome: no Postdrome: nausea Associated symptoms: Sometimes nauseaand vomiting. Photophobia and phonophobia. No visual disturbance.. She  has not had any new worse headache of her life, waking up from sleep InitialDuration: Several days without treatment (30 minutes with Maxalt) InitialFrequency: 2 to 3 days per week InitialFrequency of abortive medication: 2 days per week Triggers/exacerbating factors: Neck movement, change in barometric pressure Relieving factors: None Activity: Aggravates. Needs to lay down  Past NSAIDS: Ibuprofen, Aleve, Excedrin Past analgesics: Excedrin, Tylenol, tramadol Past abortive triptans: no Past muscle relaxants: Flexeril Past anti-emetic: Yes but uncertain of type Past antihypertensive medications: no Past antidepressant medications: Unsure. Not amitriptyline, nortriptyline or Effexor Past anticonvulsant medications: topiramate (ineffective) Past vitamins/Herbal/Supplements: no Other past therapies: heat and ice, exercises, massagefor neck pain  Family history of headache: Daughter has migraines  Report of an old CT of the head from 06/24/05 (images not currently available for review) demonstrated "two areas of low density in the inferior left basal ganglia"felt to be more likely prominent perivascular spaces rather than old lacunar infarcts. CT of cervical spine on the same day demonstrated multilevel degenerative changes. MRI of cervical spine from 06/06/17 was personally reviewed and demonstrated multilevel spondylolisthesis with cervical facet arthropathy with severe neural foraminal stenosis at bilateral C6 and left C7.She was evaluated by interventional pain management who told her nothing interventional was warranted as it was all arthritic changes.  She denies radicular pain down the arms but notes that she wakes up with bilateral hand numbness, which resolves when she shakes her hands out.   Past Medical History: Past Medical History:  Diagnosis Date  . ALLERGIC RHINITIS 06/30/2010   perennial  . Allergy    seasonal  . Coughing up blood 08/13/2012    piece of tissue? will send to path poss from upper airway sinusitis  Final path was "abscess"   . GERD 06/30/2010  .  Headache(784.0) 06/30/2010   migraines  . Hepatic steatosis   . HYPERLIPIDEMIA 06/30/2010  . HYPERTENSION 06/30/2010  . IBS 06/30/2010  . Internal hemorrhoids   . Renal lesion   . Varicose veins   . VITAMIN D DEFICIENCY 06/30/2010    Medications: Outpatient Encounter Medications as of 07/21/2020  Medication Sig  . albuterol (VENTOLIN HFA) 108 (90 Base) MCG/ACT inhaler Inhale 2 puffs by mouth every 6 hours as needed  . atenolol (TENORMIN) 25 MG tablet Take 1 tablet (25 mg total) by mouth daily. Please schedule yearly with labs for further refills. [phone]  . atorvastatin (LIPITOR) 20 MG tablet Take 1 tablet by mouth every day  . dicyclomine (BENTYL) 20 MG tablet Take 1 tablet (20 mg total) by mouth daily. As needed  . gabapentin (NEURONTIN) 100 MG capsule 3 capsules twice daily  . Multiple Vitamins-Minerals (MULTIVITAMIN ADULT PO) Take by mouth.  Marland Kitchen omeprazole (PRILOSEC) 20 MG capsule Take 1 capsule (20 mg total) by mouth daily.  . rizatriptan (MAXALT) 10 MG tablet Take 1 tablet at the onset of migraine, may repeat in 2 hours. Max of 2 doses per day  . tiZANidine (ZANAFLEX) 4 MG tablet Take one-half to one tablet by mouth at bedtime as needed for neck pain  . traMADol (ULTRAM) 50 MG tablet Take 1 tablet by mouth every 6 hours as needed  . triamcinolone acetonide (KENALOG-40) 40 MG/ML injection (RADIOLOGY ONLY) Inject into the articular space.   Marland Kitchen MEDROL 4 MG TBPK tablet as needed.  (Patient not taking: Reported on 07/21/2020)   No facility-administered encounter medications on file as of 07/21/2020.    Allergies: No Known Allergies  Family History: Family History  Problem Relation Age of Onset  . Colon cancer Mother        age 67  . Breast cancer Mother   . Arthritis Brother   . Other Brother   . Breast cancer Cousin     Social History: Social History    Socioeconomic History  . Marital status: Widowed    Spouse name: Not on file  . Number of children: 2  . Years of education: Not on file  . Highest education level: Not on file  Occupational History  . 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 Smoking status: Former Smoker    Types: Cigarettes    Quit date: 09/06/1976    Years since quitting: 43.9  . Smokeless tobacco: Never Used  Vaping Use  . Vaping Use: Never used  Substance and Sexual Activity  . Alcohol use: No    Alcohol/week: 0.0 standard drinks    Comment: rarely  . Drug use: No  . Sexual activity: Not on file  Other Topics Concern  . 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       Lives in [place]      3 cups of caffinated coffee per day. Denies soda/tea   Right handed   Social Determinants of Health   Financial Resource Strain:   . Difficulty of Paying Living Expenses: Not on file  Food Insecurity:   . Worried About Charity fundraiser in the Last Year: Not on file  .  Ran Out of Food in the Last Year: Not on file  Transportation Needs:   . Lack of Transportation (Medical): Not on file  . Lack of Transportation (Non-Medical): Not on file  Physical Activity:   . Days of Exercise per Week: Not on file  . Minutes of Exercise per Session: Not on file  Stress:   . Feeling of Stress : Not on file  Social Connections:   . Frequency of Communication with Friends and Family: Not on file  . Frequency of Social Gatherings with Friends and Family: Not on file  . Attends Religious Services: Not on file  . Active Member of Clubs or Organizations: Not on file  . Attends Archivist Meetings: Not on file  . Marital Status: Not on file  Intimate Partner Violence:   . Fear of Current or  Ex-Partner: Not on file  . Emotionally Abused: Not on file  . Physically Abused: Not on file  . Sexually Abused: Not on file    Observations/Objective:   Height 5\' 6"  (1.676 m), weight 173 lb (78.5 kg). No acute distress.  Alert and oriented.  Speech fluent and not dysarthric.  Language intact.  Assessment and Plan:   Migraine without aura, without status migrainosus, not intractable  1.  Migraine prevention:  Gabapentin 100mg  at bedtime 2.  Migraine rescue:  Rizatriptan 10mg  for migraine; tramadol for neck pain. 3.  Limit use of pain relievers to no more than 2 days out of week to prevent risk of rebound or medication-overuse headache. 4.  Keep headache diary 5.  Follow up 9 months.   Follow Up Instructions:    -I discussed the assessment and treatment plan with the patient. The patient was provided an opportunity to ask questions and all were answered. The patient agreed with the plan and demonstrated an understanding of the instructions.   The patient was advised to call back or seek an in-person evaluation if the symptoms worsen or if the condition fails to improve as anticipated.      Dudley Major, DO   CC: Shanon Ace, MD

## 2020-07-21 ENCOUNTER — Telehealth (INDEPENDENT_AMBULATORY_CARE_PROVIDER_SITE_OTHER): Payer: Medicare Other | Admitting: Neurology

## 2020-07-21 ENCOUNTER — Other Ambulatory Visit: Payer: Self-pay

## 2020-07-21 ENCOUNTER — Other Ambulatory Visit: Payer: Self-pay | Admitting: Internal Medicine

## 2020-07-21 ENCOUNTER — Encounter: Payer: Self-pay | Admitting: Neurology

## 2020-07-21 VITALS — Ht 66.0 in | Wt 173.0 lb

## 2020-07-21 DIAGNOSIS — G43009 Migraine without aura, not intractable, without status migrainosus: Secondary | ICD-10-CM

## 2020-07-22 ENCOUNTER — Other Ambulatory Visit: Payer: Self-pay | Admitting: Internal Medicine

## 2020-07-28 ENCOUNTER — Other Ambulatory Visit: Payer: Self-pay

## 2020-07-28 MED ORDER — ATENOLOL 25 MG PO TABS
25.0000 mg | ORAL_TABLET | Freq: Every day | ORAL | 0 refills | Status: DC
Start: 2020-07-28 — End: 2020-10-20

## 2020-08-03 ENCOUNTER — Encounter: Payer: Self-pay | Admitting: Physical Medicine and Rehabilitation

## 2020-08-08 ENCOUNTER — Other Ambulatory Visit: Payer: Self-pay | Admitting: Neurology

## 2020-08-08 DIAGNOSIS — G43809 Other migraine, not intractable, without status migrainosus: Secondary | ICD-10-CM

## 2020-08-12 ENCOUNTER — Other Ambulatory Visit: Payer: Self-pay | Admitting: Neurology

## 2020-09-07 ENCOUNTER — Other Ambulatory Visit: Payer: Self-pay | Admitting: Internal Medicine

## 2020-10-07 ENCOUNTER — Ambulatory Visit: Payer: Medicare Other

## 2020-10-07 IMAGING — MG MM DIGITAL SCREENING BILAT W/ TOMO W/ CAD
8 series · 8 of 24 positions shown · non-contrast
Comparison: Previous exam(s).

CLINICAL DATA: Screening.

EXAM:
DIGITAL SCREENING BILATERAL MAMMOGRAM WITH TOMO AND CAD

[L MLO synth-2D]
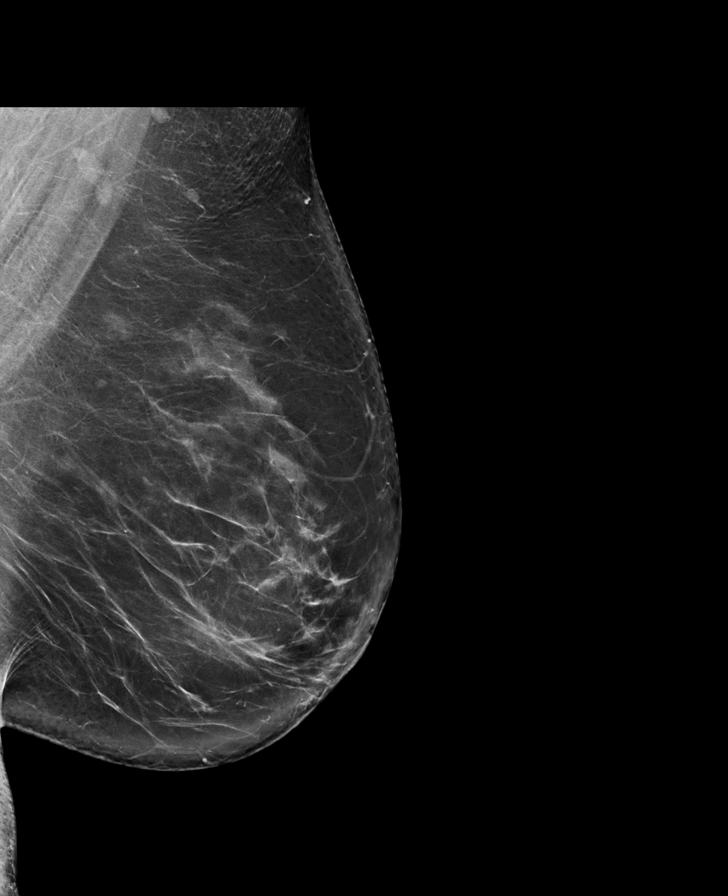

[L CC synth-2D]
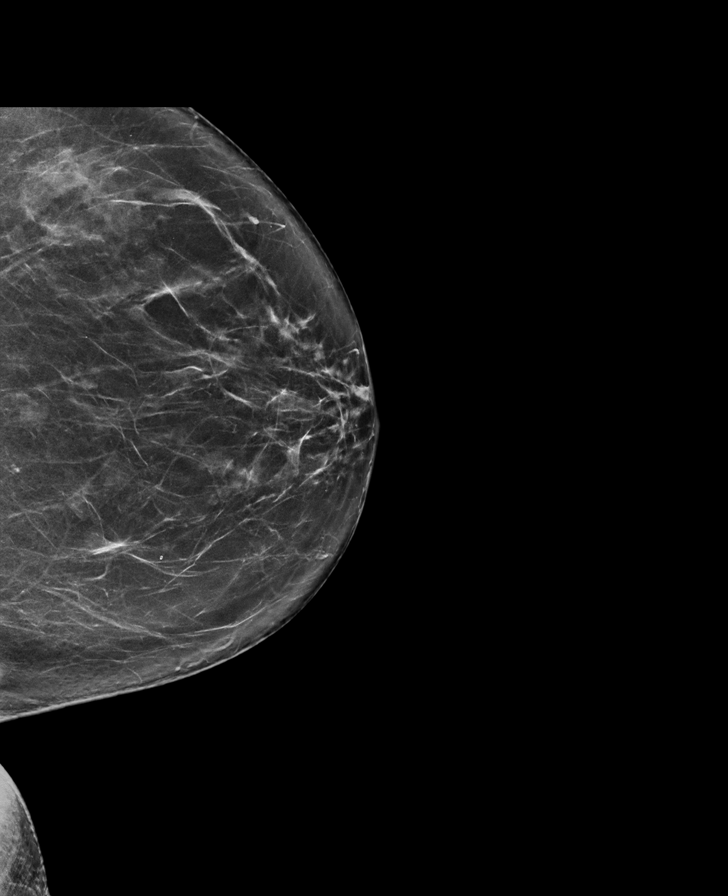

[R CC synth-2D]
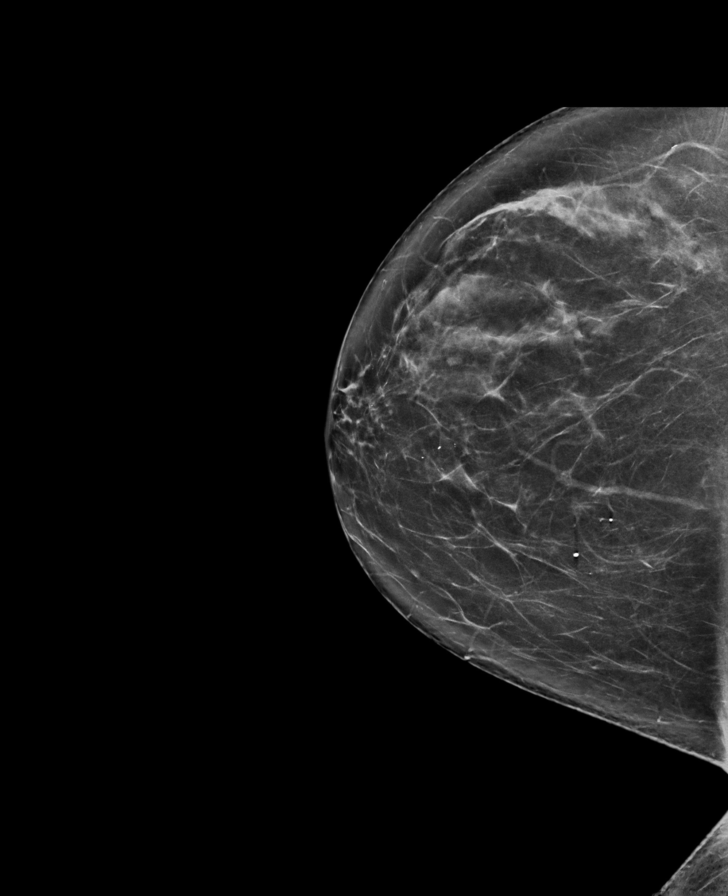

[R MLO synth-2D]
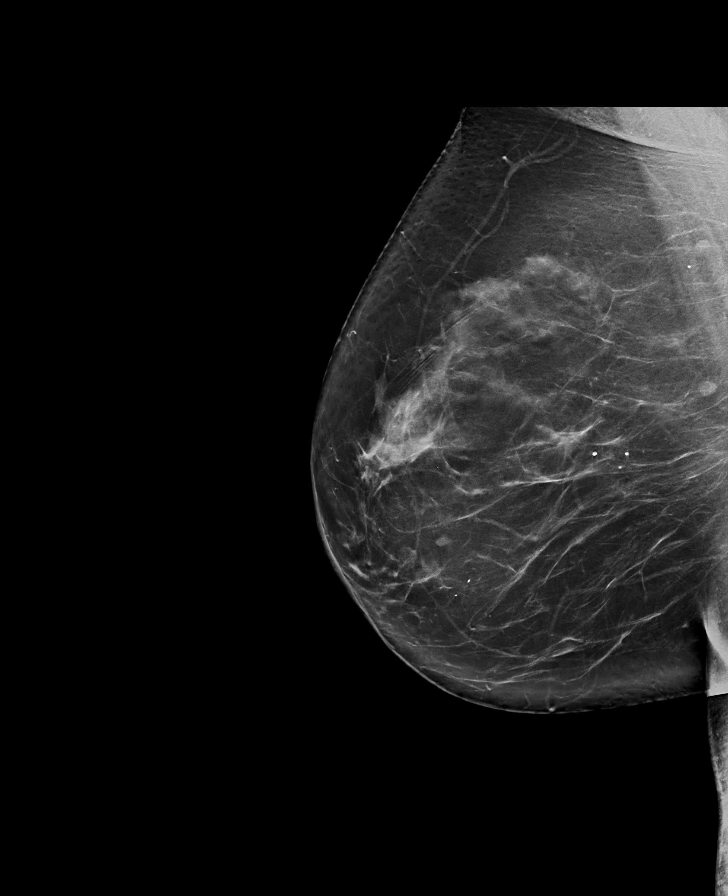

[L MLO tomo · tomo slice 47/94.0]
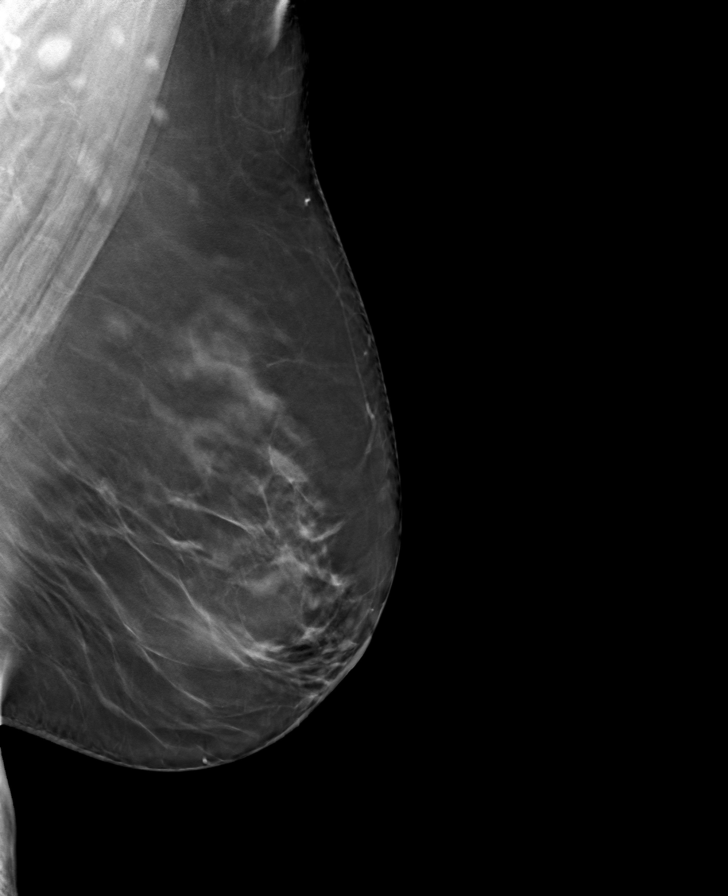

[R MLO tomo · tomo slice 45/90.0]
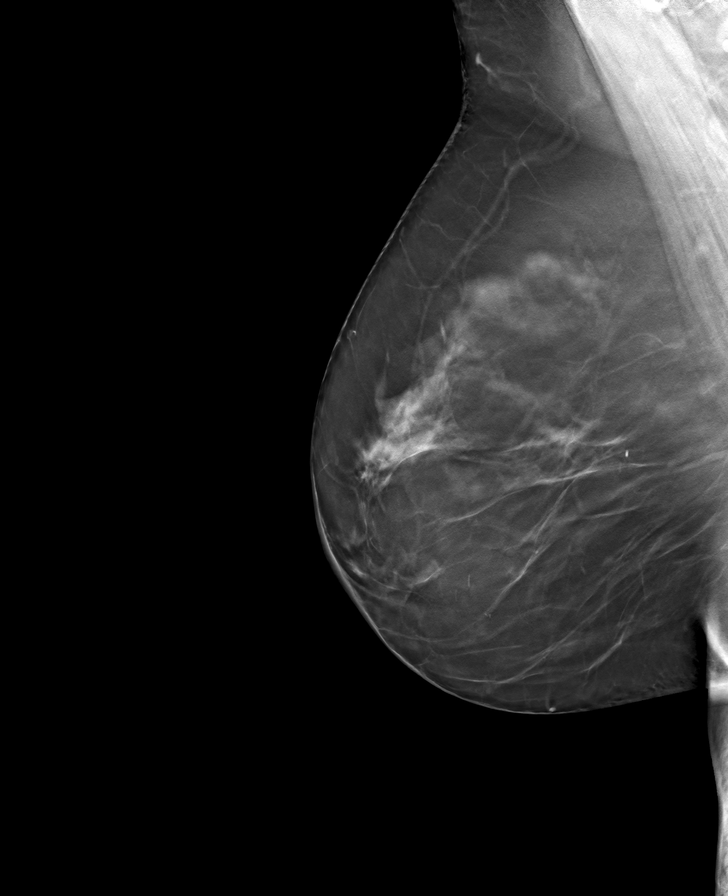

[R CC tomo · tomo slice 39/76.0]
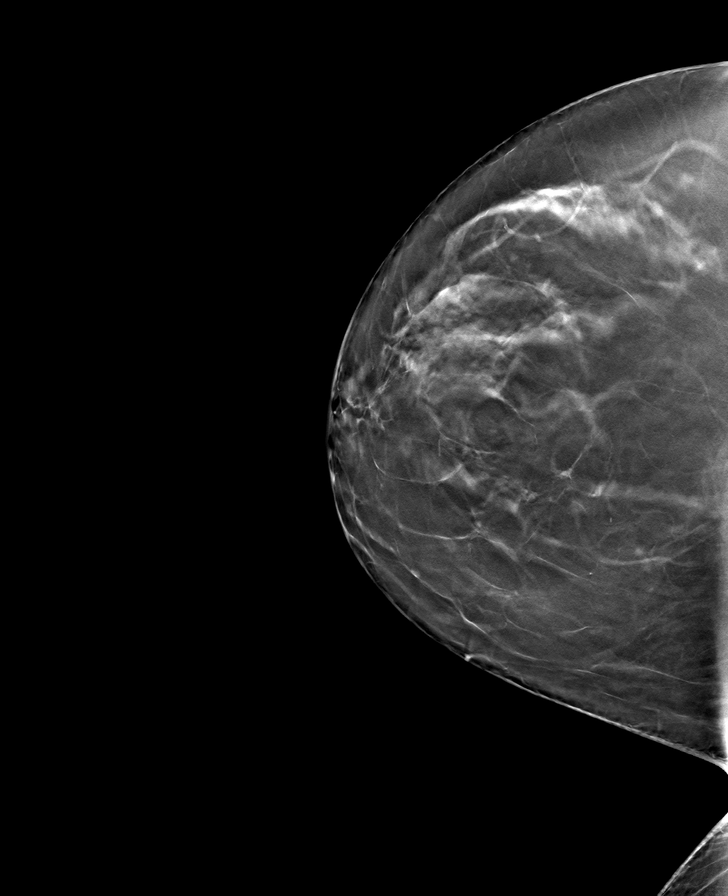

[L CC tomo · tomo slice 39/76.0]
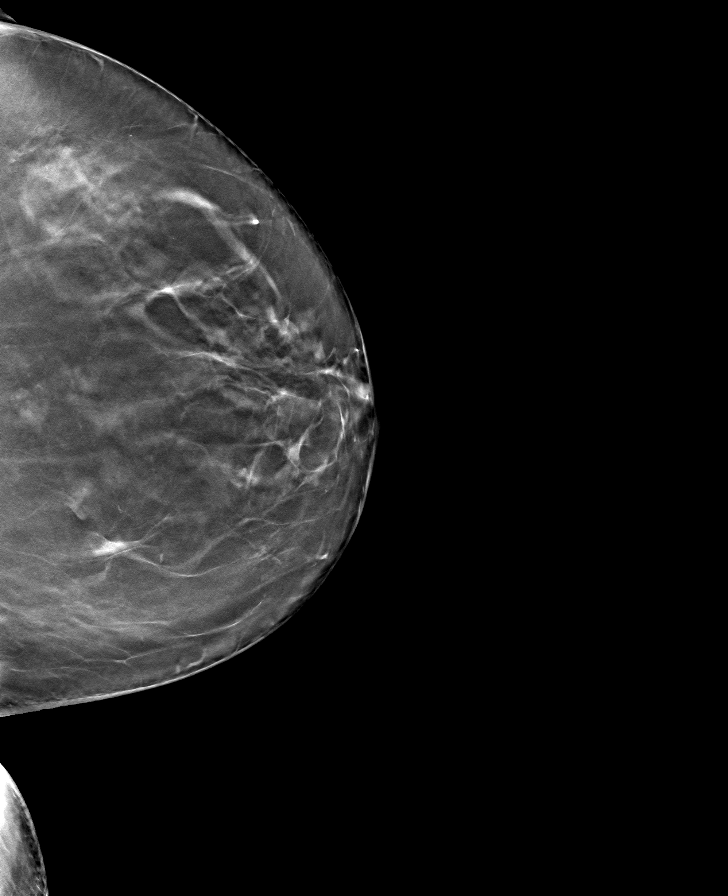

[8 of 24 positions shown; findings below may reference images not displayed]

ACR Breast Density Category b: There are scattered areas of
fibroglandular density.
FINDINGS: There are no findings suspicious for malignancy. Images were
processed with CAD.
IMPRESSION: No mammographic evidence of malignancy. A result letter of this
screening mammogram will be mailed directly to the patient.

RECOMMENDATION:
Screening mammogram in one year. (Code:CN-U-775)

BI-RADS CATEGORY  1: Negative.

## 2020-10-16 ENCOUNTER — Encounter: Payer: Self-pay | Admitting: Family Medicine

## 2020-10-16 ENCOUNTER — Other Ambulatory Visit: Payer: Self-pay

## 2020-10-16 ENCOUNTER — Other Ambulatory Visit: Payer: Self-pay | Admitting: Internal Medicine

## 2020-10-16 ENCOUNTER — Ambulatory Visit (INDEPENDENT_AMBULATORY_CARE_PROVIDER_SITE_OTHER): Payer: Medicare Other | Admitting: Family Medicine

## 2020-10-16 ENCOUNTER — Other Ambulatory Visit: Payer: Self-pay | Admitting: Family Medicine

## 2020-10-16 VITALS — BP 137/78 | HR 92 | Temp 97.0°F | Ht 66.0 in | Wt 179.0 lb

## 2020-10-16 DIAGNOSIS — H60393 Other infective otitis externa, bilateral: Secondary | ICD-10-CM | POA: Diagnosis not present

## 2020-10-16 DIAGNOSIS — H6123 Impacted cerumen, bilateral: Secondary | ICD-10-CM | POA: Diagnosis not present

## 2020-10-16 MED ORDER — DEBROX 6.5 % OT SOLN: 5.0000 [drp] | Freq: Two times a day (BID) | OTIC | 0 refills | Status: DC

## 2020-10-16 MED ORDER — CORTISPORIN-TC 3.3-3-10-0.5 MG/ML OT SUSP: 4.0000 [drp] | Freq: Four times a day (QID) | OTIC | 0 refills | Status: DC

## 2020-10-16 NOTE — Patient Instructions (Addendum)
Ear Irrigation Ear irrigation is a procedure to wash dirt and wax out of your ear canal. This procedure is also called lavage. You may need ear irrigation if you are having trouble hearing because of a buildup of earwax. You may also have ear irrigation as part of the treatment for an ear infection. Getting wax and dirt out of your ear canal can help ear drops work better. Tell a health care provider about:  Any allergies you have.  All medicines you are taking, including vitamins, herbs, eye drops, creams, and over-the-counter medicines.  Any problems you or family members have had with anesthetic medicines.  Any blood disorders you have.  Any surgeries you have had. This includes any ear surgeries.  Any medical conditions you have.  Whether you are pregnant or may be pregnant. What are the risks? Generally, this is a safe procedure. However, problems may occur, including:  Infection.  Pain.  Hearing loss.  Fluid and debris being pushed through the eardrum and into the middle ear. This can occur if there are holes in the eardrum.  Ear irrigation failing to work. What happens before the procedure?  You will talk with your provider about the procedure and plan.  You may be given ear drops to put in your ear 15-20 minutes before irrigation. This helps loosen the wax. What happens during the procedure?  A syringe is filled with water or saline solution, which is made of salt and water.  The syringe is gently inserted into the ear canal.  The fluid is used to flush out wax and other debris. The procedure may vary among health care providers and hospitals.   What can I expect after the procedure? After an ear irrigation, follow instructions given to you by your health care provider. Follow these instructions at home: Using ear irrigation kits Ear irrigation kits are available for use at home. Ask your health care provider if this is an option for you. In general, you  should:  Use a home irrigation kit only as told by your health care provider.  Read the package instructions carefully.  Follow the directions for using the syringe.  Use water that is room temperature. Do not do ear irrigation at home if you:  Have diabetes. Diabetes increases the risk of infection.  Have a hole or tear in your eardrum.  Have tubes in your ears.  Have had any ear surgery in the past.  Have been told not to irrigate your ears. Cleaning your ears  Clean the outside of your ear with a soft washcloth daily.  If told by your health care provider, use a few drops of baby oil, mineral oil, glycerin, hydrogen peroxide, or over-the-counter earwax softening drops.  Do not use cotton swabs to clean your ears. These can push wax down into the ear canal.  Do not put anything into your ears to try to remove wax. This includes ear candles.   General instructions  Take over-the-counter and prescription medicines only as told by your health care provider.  If you were prescribed an antibiotic medicine, use it as told by your health care provider. Do not stop using the antibiotic even if your condition improves.  Keep the ear clean and dry by following the instructions from your health care provider.  Keep all follow-up visits. This is important.  Visit your health care provider at least once a year to have your ears and hearing checked. Contact a health care provider if:  Your hearing is not improving or is getting worse.  You have pain or redness in your ear.  You are dizzy.  You have ringing in your ears.  You have nausea or vomiting.  You have fluid, blood, or pus coming out of your ear. Summary  Ear irrigation is a procedure to wash dirt and wax out of your ear canal. This procedure is also called lavage.  To perform ear irrigation, ear drops may be put in your ear 15-20 minutes before irrigation. Water or saline solution will be used to flush out earwax  and other debris.  You may be able to irrigate your ears at home. Ask your health care provider if this is an option for you. Follow your health care provider's instructions.  Clean your ears with a soft cloth after irrigation. Do not use cotton swabs to clean your ears. These can push wax down into the ear canal. This information is not intended to replace advice given to you by your health care provider. Make sure you discuss any questions you have with your health care provider. Document Revised: 12/11/2019 Document Reviewed: 12/11/2019 Elsevier Patient Education  2021 [employer].  If you have lab work done today you will be contacted with your lab results within the next 2 weeks.  If you have not heard from [place] then please contact us. The fastest way to get your results is to register for My Chart.   IF you received an x-ray today, you will receive an invoice from [facility]. Please contact [facility] at [phone] with questions or concerns regarding your invoice.   IF you received labwork today, you will receive an invoice from [facility]. Please contact [facility] at [phone] with questions or concerns regarding your invoice.   Our billing staff will not be able to assist you with questions regarding bills from these companies.  You will be contacted with the lab results as soon as they are available. The fastest way to get your results is to activate your My Chart account. Instructions are located on the last page of this paperwork. If you have not heard from [place] regarding the results in 2 weeks, please contact this office.

## 2020-10-16 NOTE — Telephone Encounter (Signed)
Pharmacy is requesting changes to prescription sent by provider today- sent for PCP review- Rx on backorder-alternative requested

## 2020-10-16 NOTE — Progress Notes (Signed)
2/10/202210:01 AM  Michele Monroe 01/23/1946, 75 y.o., female [id number]  Chief Complaint  Patient presents with  . L ear ringing     Started about 2 weeks ago after a shower - has difficulty hearing out of it     HPI:   Patient is a 75 y.o. female with past medical history significant for HTN, GERD, HLD who presents today for ringing in left ear.   Noticed ringing in the ear last week after shower Had water in the ear and tried to use towel to get the water out Shrill sound on the left side and hard time hearing Never happened in the past Has not tried anything for it Does not use Q-tips or OTC drops   Depression screen [facility] 2/9 10/16/2020 05/02/2019 08/08/2017  Decreased Interest 0 0 0  Down, Depressed, Hopeless 0 0 0  PHQ - 2 Score 0 0 0  Some recent data might be hidden    Fall Risk  10/16/2020 07/21/2020 02/28/2020 05/02/2019 02/08/2019  Falls in the past year? 0 0 0 0 0  Number falls in past yr: 0 0 0 0 0  Injury with Fall? 0 0 0 0 0  Follow up Falls evaluation completed - - Falls evaluation completed Falls evaluation completed     No Known Allergies  Prior to Admission medications   Medication Sig Start Date End Date Taking? Authorizing Provider  albuterol (VENTOLIN HFA) 108 (90 Base) MCG/ACT inhaler Inhale 2 puffs by mouth every 6 hours as needed 12/31/19  Yes Panosh, [name], MD  atenolol (TENORMIN) 25 MG tablet Take 1 tablet (25 mg total) by mouth daily. Please schedule yearly with labs for further refills. [phone] 07/28/20  Yes Panosh, [name], MD  atorvastatin (LIPITOR) 20 MG tablet Take 1 tablet by mouth every day 07/14/20  Yes Panosh, [name], MD  dicyclomine (BENTYL) 20 MG tablet Take 1 tablet (20 mg total) by mouth daily. As needed 04/24/19  Yes Nandigam, Venia Minks, MD  gabapentin (NEURONTIN) 100 MG capsule 3 capsules twice daily 07/04/20  Yes Jaffe, Adam R, DO  Multiple Vitamins-Minerals (MULTIVITAMIN ADULT PO) Take by mouth.   Yes [provider]   omeprazole (PRILOSEC) 20 MG capsule Take 1 capsule by mouth every day 07/22/20  Yes Panosh, [name], MD  rizatriptan (MAXALT) 10 MG tablet Take 1 tablet at the onset of migraine, may repeat in 2 hours. Max of 2 doses per day 08/08/20  Yes Jaffe, Adam R, DO  tiZANidine (ZANAFLEX) 4 MG tablet TAKE 1/2 TO 1 TABLET BY MOUTH AT BEDTIME AS NEEDED FOR NECK PAIN 07/22/20  Yes Panosh, [name], MD  traMADol (ULTRAM) 50 MG tablet Take 1 tablet by mouth every 6 hours as needed 08/12/20  Yes Jaffe, Adam R, DO  triamcinolone acetonide (KENALOG-40) 40 MG/ML injection (RADIOLOGY ONLY) Inject into the articular space.  Patient not taking: Reported on 10/16/2020 09/13/19   [provider]    Past Medical History:  Diagnosis Date  . ALLERGIC RHINITIS 06/30/2010   perennial  . Allergy    seasonal  . Coughing up blood 08/13/2012   piece of tissue? will send to path poss from upper airway sinusitis  Final path was "abscess"   . GERD 06/30/2010  . Headache(784.0) 06/30/2010   migraines  . Hepatic steatosis   . HYPERLIPIDEMIA 06/30/2010  . HYPERTENSION 06/30/2010  . IBS 06/30/2010  . Internal hemorrhoids   . Renal lesion   . Varicose veins   .  VITAMIN D DEFICIENCY 06/30/2010    Past Surgical History:  Procedure Laterality Date  . BREAST SURGERY     needle bx left breast  . COLONOSCOPY    . feet     surgery  . ROTATOR CUFF REPAIR Right   . varicose veins      Social History   Tobacco Use  . Smoking status: Former Smoker    Types: Cigarettes    Quit date: 09/06/1976    Years since quitting: 44.1  . Smokeless tobacco: Never Used  Substance Use Topics  . Alcohol use: No    Alcohol/week: 0.0 standard drinks    Comment: rarely    Family History  Problem Relation Age of Onset  . Colon cancer Mother        age 9  . Breast cancer Mother   . Arthritis Brother   . Other Brother   . Breast cancer Cousin     Review of Systems  Constitutional: Negative for chills, fever and  malaise/fatigue.  HENT: Positive for hearing loss and tinnitus. Negative for congestion, ear discharge, ear pain, sinus pain and sore throat.   Respiratory: Negative for cough, shortness of breath and wheezing.   Cardiovascular: Negative for chest pain, palpitations and leg swelling.  Gastrointestinal: Negative for constipation, diarrhea, heartburn, nausea and vomiting.  Musculoskeletal: Negative for back pain, joint pain and neck pain.  Neurological: Negative for dizziness, weakness and headaches.     OBJECTIVE:  Today's Vitals   10/16/20 0915  BP: 137/78  Pulse: 92  Temp: (!) 97 F (36.1 C)  SpO2: 96%  Weight: 179 lb (81.2 kg)  Height: 5\' 6"  (1.676 m)   Body mass index is 28.89 kg/m.   Physical Exam Constitutional:      General: She is not in acute distress.    Appearance: Normal appearance. She is not ill-appearing.  HENT:     Head: Normocephalic.     Right Ear: Tympanic membrane and external ear normal. There is impacted cerumen (Partially).     Left Ear: External ear normal. There is impacted cerumen (complete, unable to visualize TM).  Cardiovascular:     Rate and Rhythm: Normal rate and regular rhythm.     Pulses: Normal pulses.     Heart sounds: Normal heart sounds. No murmur heard. No friction rub. No gallop.   Pulmonary:     Effort: Pulmonary effort is normal. No respiratory distress.     Breath sounds: Normal breath sounds. No stridor. No wheezing, rhonchi or rales.  Abdominal:     General: Bowel sounds are normal.     Palpations: Abdomen is soft.     Tenderness: There is no abdominal tenderness.  Musculoskeletal:     Right lower leg: No edema.     Left lower leg: No edema.  Skin:    General: Skin is warm and dry.  Neurological:     Mental Status: She is alert and oriented to person, place, and time.  Psychiatric:        Mood and Affect: Mood normal.        Behavior: Behavior normal.    Post irrigation bilateral ears TM intact, canal erythema  noted Tinnitus improved post irrigation  No results found for this or any previous visit (from the past 24 hour(s)).  No results found.   ASSESSMENT and PLAN  Problem List Items Addressed This Visit   None   Visit Diagnoses    Bilateral impacted cerumen    -  Primary   Relevant Medications   carbamide peroxide (DEBROX) 6.5 % OTIC solution   Other Relevant Orders   Ear wax removal   Infective otitis externa of both ears       Relevant Medications   neomycin-colistin-hydrocortisone-thonzonium (CORTISPORIN-TC) 3.11-06-08-0.5 MG/ML OTIC suspension      Plan . Debrox drops as needed . Cortisporin qid x 7 days . Rtc/ed precautions provided . Discussed r/se/b of medications . Education provided on prevention    Return if symptoms worsen or fail to improve.    Huston Foley Adabella Stanis, FNP-BC [facility] [place], [place] [address] Ph.  [phone] Fax [phone]

## 2020-10-19 ENCOUNTER — Other Ambulatory Visit: Payer: Self-pay | Admitting: Family Medicine

## 2020-10-19 DIAGNOSIS — H60393 Other infective otitis externa, bilateral: Secondary | ICD-10-CM

## 2020-10-19 MED ORDER — NEOMYCIN-POLYMYXIN-HC 3.5-10000-1 OT SOLN: 4.0000 [drp] | Freq: Four times a day (QID) | OTIC | 0 refills | Status: AC

## 2020-10-20 ENCOUNTER — Ambulatory Visit: Payer: Medicare Other | Admitting: Internal Medicine

## 2020-10-21 ENCOUNTER — Ambulatory Visit (INDEPENDENT_AMBULATORY_CARE_PROVIDER_SITE_OTHER): Payer: Medicare Other

## 2020-10-21 ENCOUNTER — Encounter: Payer: Self-pay | Admitting: Gastroenterology

## 2020-10-21 ENCOUNTER — Other Ambulatory Visit: Payer: Self-pay

## 2020-10-21 ENCOUNTER — Other Ambulatory Visit: Payer: Self-pay | Admitting: Internal Medicine

## 2020-10-21 DIAGNOSIS — Z1211 Encounter for screening for malignant neoplasm of colon: Secondary | ICD-10-CM | POA: Diagnosis not present

## 2020-10-21 DIAGNOSIS — Z0000 Encounter for general adult medical examination without abnormal findings: Secondary | ICD-10-CM | POA: Diagnosis not present

## 2020-10-21 DIAGNOSIS — Z800 Family history of malignant neoplasm of digestive organs: Secondary | ICD-10-CM | POA: Diagnosis not present

## 2020-10-21 NOTE — Progress Notes (Signed)
Virtual Visit via Telephone Note  I connected with  Michele Monroe on 10/21/20 at  8:45 AM EST by telephone and verified that I am speaking with the correct person using two identifiers.  Location: Patient: Home Provider: Office Persons participating in the virtual visit: patient/Nurse Health Advisor   I discussed the limitations, risks, security and privacy concerns of performing an evaluation and management service by telephone and the availability of in person appointments. The patient expressed understanding and agreed to proceed.  Interactive audio and video telecommunications were attempted between this nurse and patient, however failed, due to patient having technical difficulties OR patient did not have access to video capability.  We continued and completed visit with audio only.  Some vital signs may be absent or patient reported.   Michele Brace, LPN    Subjective:   Michele Monroe is a 75 y.o. female who presents for Medicare Annual (Subsequent) preventive examination.  Review of Systems     Cardiac Risk Factors include: advanced age (>72men, >59 women);hypertension;dyslipidemia     Objective:    There were no vitals filed for this visit. There is no height or weight on file to calculate BMI.  Advanced Directives 10/21/2020 07/21/2020 02/28/2020 06/13/2017  Does Patient Have a Medical Advance Directive? No No No No  Would patient like information on creating a medical advance directive? Yes (MAU/Ambulatory/Procedural Areas - Information given) - - -    Current Medications (verified) Outpatient Encounter Medications as of 10/21/2020  Medication Sig  . albuterol (VENTOLIN HFA) 108 (90 Base) MCG/ACT inhaler Inhale 2 puffs by mouth every 6 hours as needed  . atenolol (TENORMIN) 25 MG tablet TAKE ONE TABLET BY MOUTH EVERY DAY  . atorvastatin (LIPITOR) 20 MG tablet Take 1 tablet by mouth every day  . carbamide peroxide (DEBROX) 6.5 % OTIC solution Place 5 drops into both  ears 2 (two) times daily.  . CORTISPORIN-TC 3.11-06-08-0.5 MG/ML OTIC suspension PLACE 4 DROPS INTO BOTH EARS 4 (FOUR) TIMES DAILY FOR 7 DAYS.  Marland Kitchen gabapentin (NEURONTIN) 100 MG capsule 3 capsules twice daily  . Multiple Vitamins-Minerals (MULTIVITAMIN ADULT PO) Take by mouth.  . neomycin-polymyxin-hydrocortisone (CORTISPORIN) OTIC solution Place 4 drops into both ears 4 (four) times daily for 7 days.  . rizatriptan (MAXALT) 10 MG tablet Take 1 tablet at the onset of migraine, may repeat in 2 hours. Max of 2 doses per day  . tiZANidine (ZANAFLEX) 4 MG tablet TAKE 1/2 TO 1 TABLET BY MOUTH AT BEDTIME AS NEEDED FOR NECK PAIN  . traMADol (ULTRAM) 50 MG tablet Take 1 tablet by mouth every 6 hours as needed  . dicyclomine (BENTYL) 20 MG tablet Take 1 tablet (20 mg total) by mouth daily. As needed (Patient not taking: Reported on 10/21/2020)  . omeprazole (PRILOSEC) 20 MG capsule Take 1 capsule by mouth every day  . [DISCONTINUED] triamcinolone acetonide (KENALOG-40) 40 MG/ML injection (RADIOLOGY ONLY) Inject into the articular space.  (Patient not taking: No sig reported)   No facility-administered encounter medications on file as of 10/21/2020.    Allergies (verified) Patient has no known allergies.   History: Past Medical History:  Diagnosis Date  . ALLERGIC RHINITIS 06/30/2010   perennial  . Allergy    seasonal  . Coughing up blood 08/13/2012   piece of tissue? will send to path poss from upper airway sinusitis  Final path was "abscess"   . GERD 06/30/2010  . Headache(784.0) 06/30/2010   migraines  . Hepatic steatosis   .  HYPERLIPIDEMIA 06/30/2010  . HYPERTENSION 06/30/2010  . IBS 06/30/2010  . Internal hemorrhoids   . Renal lesion   . Varicose veins   . VITAMIN D DEFICIENCY 06/30/2010   Past Surgical History:  Procedure Laterality Date  . BREAST SURGERY     needle bx left breast  . COLONOSCOPY    . feet     surgery  . ROTATOR CUFF REPAIR Right   . varicose veins     Family  History  Problem Relation Age of Onset  . Colon cancer Mother        age 57  . Breast cancer Mother   . Arthritis Brother   . Other Brother   . Breast cancer Cousin    Social History   Socioeconomic History  . Marital status: Widowed    Spouse name: Not on file  . Number of children: 2  . Years of education: Not on file  . Highest education level: Not on file  Occupational History  . 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 Smoking status: Former Smoker    Types: Cigarettes    Quit date: 09/06/1976    Years since quitting: 44.1  . Smokeless tobacco: Never Used  Vaping Use  . Vaping Use: Never used  Substance and Sexual Activity  . Alcohol use: No    Alcohol/week: 0.0 standard drinks    Comment: rarely  . Drug use: No  . Sexual activity: Not on file  Other Topics Concern  . 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       Lives in [place]      3 cups of caffinated coffee per day. Denies soda/tea   Right handed   Social Determinants of Health   Financial Resource Strain: Low Risk   . Difficulty of Paying Living Expenses: Not hard at all  Food Insecurity: No Food Insecurity  . Worried About Charity fundraiser in the Last Year: Never true  . Ran Out of Food in the Last Year: Never true  Transportation Needs: No Transportation Needs  . Lack of Transportation (Medical): No  . Lack of Transportation (Non-Medical): No  Physical Activity: Insufficiently Active  . Days of Exercise per Week: 5 days  . Minutes of Exercise per Session: 10 min  Stress: No Stress Concern Present  . Feeling of Stress : Not at all  Social Connections: Moderately Isolated  . Frequency of Communication with Friends and Family: More than three times a week  . Frequency of  Social Gatherings with Friends and Family: More than three times a week  . Attends Religious Services: 1 to 4 times per year  . Active Member of Clubs or Organizations: No  . Attends Archivist Meetings: Never  . Marital Status: Widowed    Tobacco Counseling Counseling given: Not Answered   Clinical Intake:  Pre-visit preparation completed: Yes  Pain : No/denies pain     BMI - recorded: 28.89 Nutritional Status: BMI 25 -29 Overweight Nutritional Risks: None Diabetes: No  How often do you need to have someone help you when you read instructions, pamphlets, or other written materials from your doctor or pharmacy?:  1 - Never  Diabetic?No  Interpreter Needed?: No  Information entered by :: Charlott Rakes, LPN   Activities of Daily Living In your present state of health, do you have any difficulty performing the following activities: 10/21/2020  Hearing? Y  Comment ear infection being treated now  Vision? N  Difficulty concentrating or making decisions? Y  Comment at times  Walking or climbing stairs? N  Dressing or bathing? N  Doing errands, shopping? N  Preparing Food and eating ? N  Using the Toilet? N  In the past six months, have you accidently leaked urine? Y  Comment wears depends  Do you have problems with loss of bowel control? Y  Comment accident at times  Managing your Medications? N  Managing your Finances? N  Some recent data might be hidden    Patient Care Team: Panosh, [name], MD as PCP - Loman Brooklyn, Stephan Minister, DO as Consulting Physician (Neurology) Mauri Pole, MD as Consulting Physician (Gastroenterology)  Indicate any recent Medical Services you may have received from other than [facility] providers in the past year (date may be approximate).     Assessment:   This is a routine wellness examination for Michele Monroe.  Hearing/Vision screen  Hearing Screening   125Hz  250Hz  500Hz  1000Hz  2000Hz  3000Hz  4000Hz  6000Hz  8000Hz   Right ear:            Left ear:           Comments: Ringing in ear and had swimmers and wax build up being treated at this time  Vision Screening Comments: Pt follows up with [facility] for annual eye exams  Dietary issues and exercise activities discussed: Current Exercise Habits: Home exercise routine, Type of exercise: walking, Time (Minutes): 15, Frequency (Times/Week): 5, Weekly Exercise (Minutes/Week): 75  Goals    . Patient Stated     Lose weight       Depression Screen PHQ 2/9 Scores 10/21/2020 10/16/2020 05/02/2019 08/08/2017 06/15/2016 03/06/2015 09/04/2013  PHQ - 2 Score 0 0 0 0 0 1 0    Fall Risk Fall Risk  10/21/2020 10/16/2020 07/21/2020 02/28/2020 05/02/2019  Falls in the past year? 0 0 0 0 0  Number falls in past yr: 0 0 0 0 0  Injury with Fall? 0 0 0 0 0  Risk for fall due to : Impaired vision - - - -  Follow up Falls prevention discussed Falls evaluation completed - - Falls evaluation completed    FALL RISK PREVENTION PERTAINING TO THE HOME:  Any stairs in or around the home? No  If so, are there any without handrails? No  Home free of loose throw rugs in walkways, pet beds, electrical cords, etc? Yes  Adequate lighting in your home to reduce risk of falls? Yes   ASSISTIVE DEVICES UTILIZED TO PREVENT FALLS:  Life alert? No  Use of a cane, walker or w/c? No  Grab bars in the bathroom? No  Shower chair or bench in shower? No  Elevated toilet seat or a handicapped toilet? No   TIMED UP AND GO:  Was the test performed? No .      Cognitive Function:     6CIT Screen 10/21/2020  Count back from 20 0 points  Months in reverse 0 points  Repeat phrase 4 points    Immunizations Immunization History  Administered Date(s) Administered  . Fluad Quad(high Dose 65+) 05/02/2019  . Influenza Split 06/05/2012, 05/15/2013  . Influenza, High Dose Seasonal PF 04/20/2016,  06/23/2017, 06/19/2018  . Influenza,inj,Quad PF,6+ Mos 06/03/2015  . Influenza-Unspecified 06/06/2014  .  Pneumococcal Conjugate-13 08/07/2012  . Pneumococcal Polysaccharide-23 09/24/2012  . Td 09/07/2007  . Zoster 09/24/2012    TDAP status: Due, Education has been provided regarding the importance of this vaccine. Advised may receive this vaccine at local pharmacy or Health Dept. Aware to provide a copy of the vaccination record if obtained from local pharmacy or Health Dept. Verbalized acceptance and understanding.  Flu Vaccine status: Declined, Education has been provided regarding the importance of this vaccine but patient still declined. Advised may receive this vaccine at local pharmacy or Health Dept. Aware to provide a copy of the vaccination record if obtained from local pharmacy or Health Dept. Verbalized acceptance and understanding.  Pneumococcal vaccine status: Up to date  Covid-19 vaccine status: Declined, Education has been provided regarding the importance of this vaccine but patient still declined. Advised may receive this vaccine at local pharmacy or Health Dept.or vaccine clinic. Aware to provide a copy of the vaccination record if obtained from local pharmacy or Health Dept. Verbalized acceptance and understanding.  Qualifies for Shingles Vaccine? Yes   Zostavax completed Yes   Shingrix: No  Screening Tests Health Maintenance  Topic Date Due  . COLONOSCOPY (Pts 45-101yrs Insurance coverage will need to be confirmed)  07/09/2020  . INFLUENZA VACCINE  12/04/2020 (Originally 04/06/2020)  . TETANUS/TDAP  10/21/2021 (Originally 09/06/2017)  . MAMMOGRAM  06/20/2022  . DEXA SCAN  Completed  . Hepatitis C Screening  Completed  . PNA vac Low Risk Adult  Completed  . COVID-19 Vaccine  Discontinued    Health Maintenance  Health Maintenance Due  Topic Date Due  . COLONOSCOPY (Pts 45-32yrs Insurance coverage will need to be confirmed)  07/09/2020    Colorectal cancer screening: Referral to GI placed 10/21/20. Pt aware the office will call re: appt.  Mammogram status: Completed  06/20/20. Repeat every year  Bone Density status: Completed 06/04/19. Results reflect: Bone density results: OSTEOPENIA. Repeat every 2 years.  Additional Screening:  Hepatitis C Screening: Completed 08/08/17  Vision Screening: Recommended annual ophthalmology exams for early detection of glaucoma and other disorders of the eye. Is the patient up to date with their annual eye exam?  No  Who is the provider or what is the name of the office in which the patient attends annual eye exams? Pt will make appt today with [facility]  If pt is not established with a provider, would they like to be referred to a provider to establish care? No .   Dental Screening: Recommended annual dental exams for proper oral hygiene  Community Resource Referral / Chronic Care Management: CRR required this visit?  No   CCM required this visit?  No      Plan:     I have personally reviewed and noted the following in the patient's chart:   . Medical and social history . Use of alcohol, tobacco or illicit drugs  . Current medications and supplements . Functional ability and status . Nutritional status . Physical activity . Advanced directives . List of other physicians . Hospitalizations, surgeries, and ER visits in previous 12 months . Vitals . Screenings to include cognitive, depression, and falls . Referrals and appointments  In addition, I have reviewed and discussed with patient certain preventive protocols, quality metrics, and best practice recommendations. A written personalized care plan for preventive services as well as general preventive health recommendations were provided to patient.  Michele Brace, LPN   [id number]/58/[id number]   Nurse Notes: Pt is currently being treated for ear infection. Related to ringing, wax build up and swimmers ear.

## 2020-10-21 NOTE — Patient Instructions (Addendum)
Michele Monroe , Thank you for taking time to come for your Medicare Wellness Visit. I appreciate your ongoing commitment to your health goals. Please review the following plan we discussed and let me know if I can assist you in the future.   Screening recommendations/referrals: Colonoscopy: Order placed today 10/21/20 Mammogram: Done 06/20/20 Bone Density: Done 06/04/19 Recommended yearly ophthalmology/optometry visit for glaucoma screening and checkup Recommended yearly dental visit for hygiene and checkup  Vaccinations: Influenza vaccine: Declined and discussed Pneumococcal vaccine: Up to date Tdap vaccine: Declined and discussed Shingles vaccine: Shingrix discussed. Please contact your pharmacy for coverage information.    Covid-19:Declined and discussed  Advanced directives: Advance directive discussed with you today. I have provided a copy for you to complete at home and have notarized. Once this is complete please bring a copy in to our office so we can scan it into your chart.  Conditions/risks identified: Lose weight   Next appointment: Follow up in one year for your annual wellness visit    Preventive Care 65 Years and Older, Female Preventive care refers to lifestyle choices and visits with your health care provider that can promote health and wellness. What does preventive care include?  A yearly physical exam. This is also called an annual well check.  Dental exams once or twice a year.  Routine eye exams. Ask your health care provider how often you should have your eyes checked.  Personal lifestyle choices, including:  Daily care of your teeth and gums.  Regular physical activity.  Eating a healthy diet.  Avoiding tobacco and drug use.  Limiting alcohol use.  Practicing safe sex.  Taking low-dose aspirin every day.  Taking vitamin and mineral supplements as recommended by your health care provider. What happens during an annual well check? The services and  screenings done by your health care provider during your annual well check will depend on your age, overall health, lifestyle risk factors, and family history of disease. Counseling  Your health care provider may ask you questions about your:  Alcohol use.  Tobacco use.  Drug use.  Emotional well-being.  Home and relationship well-being.  Sexual activity.  Eating habits.  History of falls.  Memory and ability to understand (cognition).  Work and work Statistician.  Reproductive health. Screening  You may have the following tests or measurements:  Height, weight, and BMI.  Blood pressure.  Lipid and cholesterol levels. These may be checked every 5 years, or more frequently if you are over 74 years old.  Skin check.  Lung cancer screening. You may have this screening every year starting at age 74 if you have a 30-pack-year history of smoking and currently smoke or have quit within the past 15 years.  Fecal occult blood test (FOBT) of the stool. You may have this test every year starting at age 74.  Flexible sigmoidoscopy or colonoscopy. You may have a sigmoidoscopy every 5 years or a colonoscopy every 10 years starting at age 74.  Hepatitis C blood test.  Hepatitis B blood test.  Sexually transmitted disease (STD) testing.  Diabetes screening. This is done by checking your blood sugar (glucose) after you have not eaten for a while (fasting). You may have this done every 1-3 years.  Bone density scan. This is done to screen for osteoporosis. You may have this done starting at age 74.  Mammogram. This may be done every 1-2 years. Talk to your health care provider about how often you should have regular mammograms. Talk  with your health care provider about your test results, treatment options, and if necessary, the need for more tests. Vaccines  Your health care provider may recommend certain vaccines, such as:  Influenza vaccine. This is recommended every  year.  Tetanus, diphtheria, and acellular pertussis (Tdap, Td) vaccine. You may need a Td booster every 10 years.  Zoster vaccine. You may need this after age 74.  Pneumococcal 13-valent conjugate (PCV13) vaccine. One dose is recommended after age 74.  Pneumococcal polysaccharide (PPSV23) vaccine. One dose is recommended after age 74. Talk to your health care provider about which screenings and vaccines you need and how often you need them. This information is not intended to replace advice given to you by your health care provider. Make sure you discuss any questions you have with your health care provider. Document Released: 09/19/2015 Document Revised: 05/12/2016 Document Reviewed: 06/24/2015 Elsevier Interactive Patient Education  2017 [place] Prevention in the Home Falls can cause injuries. They can happen to people of all ages. There are many things you can do to make your home safe and to help prevent falls. What can I do on the outside of my home?  Regularly fix the edges of walkways and driveways and fix any cracks.  Remove anything that might make you trip as you walk through a door, such as a raised step or threshold.  Trim any bushes or trees on the path to your home.  Use bright outdoor lighting.  Clear any walking paths of anything that might make someone trip, such as rocks or tools.  Regularly check to see if handrails are loose or broken. Make sure that both sides of any steps have handrails.  Any raised decks and porches should have guardrails on the edges.  Have any leaves, snow, or ice cleared regularly.  Use sand or salt on walking paths during winter.  Clean up any spills in your garage right away. This includes oil or grease spills. What can I do in the bathroom?  Use night lights.  Install grab bars by the toilet and in the tub and shower. Do not use towel bars as grab bars.  Use non-skid mats or decals in the tub or shower.  If you  need to sit down in the shower, use a plastic, non-slip stool.  Keep the floor dry. Clean up any water that spills on the floor as soon as it happens.  Remove soap buildup in the tub or shower regularly.  Attach bath mats securely with double-sided non-slip rug tape.  Do not have throw rugs and other things on the floor that can make you trip. What can I do in the bedroom?  Use night lights.  Make sure that you have a light by your bed that is easy to reach.  Do not use any sheets or blankets that are too big for your bed. They should not hang down onto the floor.  Have a firm chair that has side arms. You can use this for support while you get dressed.  Do not have throw rugs and other things on the floor that can make you trip. What can I do in the kitchen?  Clean up any spills right away.  Avoid walking on wet floors.  Keep items that you use a lot in easy-to-reach places.  If you need to reach something above you, use a strong step stool that has a grab bar.  Keep electrical cords out of the way.  Do  not use floor polish or wax that makes floors slippery. If you must use wax, use non-skid floor wax.  Do not have throw rugs and other things on the floor that can make you trip. What can I do with my stairs?  Do not leave any items on the stairs.  Make sure that there are handrails on both sides of the stairs and use them. Fix handrails that are broken or loose. Make sure that handrails are as long as the stairways.  Check any carpeting to make sure that it is firmly attached to the stairs. Fix any carpet that is loose or worn.  Avoid having throw rugs at the top or bottom of the stairs. If you do have throw rugs, attach them to the floor with carpet tape.  Make sure that you have a light switch at the top of the stairs and the bottom of the stairs. If you do not have them, ask someone to add them for you. What else can I do to help prevent falls?  Wear shoes  that:  Do not have high heels.  Have rubber bottoms.  Are comfortable and fit you well.  Are closed at the toe. Do not wear sandals.  If you use a stepladder:  Make sure that it is fully opened. Do not climb a closed stepladder.  Make sure that both sides of the stepladder are locked into place.  Ask someone to hold it for you, if possible.  Clearly mark and make sure that you can see:  Any grab bars or handrails.  First and last steps.  Where the edge of each step is.  Use tools that help you move around (mobility aids) if they are needed. These include:  Canes.  Walkers.  Scooters.  Crutches.  Turn on the lights when you go into a dark area. Replace any light bulbs as soon as they burn out.  Set up your furniture so you have a clear path. Avoid moving your furniture around.  If any of your floors are uneven, fix them.  If there are any pets around you, be aware of where they are.  Review your medicines with your doctor. Some medicines can make you feel dizzy. This can increase your chance of falling. Ask your doctor what other things that you can do to help prevent falls. This information is not intended to replace advice given to you by your health care provider. Make sure you discuss any questions you have with your health care provider. Document Released: 06/19/2009 Document Revised: 01/29/2016 Document Reviewed: 09/27/2014 Elsevier Interactive Patient Education  2017 [employer].

## 2020-10-29 ENCOUNTER — Other Ambulatory Visit: Payer: Self-pay | Admitting: Internal Medicine

## 2020-10-31 ENCOUNTER — Other Ambulatory Visit: Payer: Self-pay | Admitting: Internal Medicine

## 2020-11-08 ENCOUNTER — Other Ambulatory Visit: Payer: Self-pay | Admitting: Internal Medicine

## 2020-11-10 ENCOUNTER — Encounter: Payer: Self-pay | Admitting: Gastroenterology

## 2020-11-20 ENCOUNTER — Other Ambulatory Visit: Payer: Self-pay

## 2020-11-20 NOTE — Telephone Encounter (Signed)
RX denial sent escribed due to medication being given during sick visit.

## 2020-11-27 DIAGNOSIS — H2513 Age-related nuclear cataract, bilateral: Secondary | ICD-10-CM | POA: Diagnosis not present

## 2020-11-30 ENCOUNTER — Other Ambulatory Visit: Payer: Self-pay | Admitting: Internal Medicine

## 2021-01-07 ENCOUNTER — Ambulatory Visit (AMBULATORY_SURGERY_CENTER): Payer: Self-pay

## 2021-01-07 ENCOUNTER — Other Ambulatory Visit: Payer: Self-pay

## 2021-01-07 ENCOUNTER — Other Ambulatory Visit: Payer: Self-pay | Admitting: Gastroenterology

## 2021-01-07 VITALS — Ht 66.0 in | Wt 178.0 lb

## 2021-01-07 DIAGNOSIS — Z86010 Personal history of colonic polyps: Secondary | ICD-10-CM

## 2021-01-07 DIAGNOSIS — Z800 Family history of malignant neoplasm of digestive organs: Secondary | ICD-10-CM

## 2021-01-07 MED ORDER — PEG-KCL-NACL-NASULF-NA ASC-C 100 G PO SOLR: 1.0000 | Freq: Once | ORAL | 0 refills | Status: DC

## 2021-01-07 NOTE — Progress Notes (Signed)
No egg or soy allergy known to patient  No issues with past sedation with any surgeries or procedures Patient denies ever being told they had issues or difficulty with intubation  No FH of Malignant Hyperthermia No diet pills per patient No home 02 use per patient  No blood thinners per patient  Pt denies issues with constipation at this time- having "diarrhea lately no matter what I am eating" No A fib or A flutter  EMMI video via MyChart  COVID 19 guidelines implemented in PV today with Pt and RN  NO PA's for preps discussed with pt in PV today  Discussed with pt there will be an out-of-pocket cost for prep and that varies from $0 to 70 dollars  Due to the COVID-19 pandemic we are asking patients to follow certain guidelines.  Pt aware of COVID protocols and [facility] guidelines

## 2021-01-07 NOTE — Telephone Encounter (Signed)
Left message for patient to review MyChart letter and to also contact her pharmacy as Moviprep is not covered by her insurance- revised instructions entered into patient's MyChart account and suprep RX sent to pharmacy; patient advised to call back to the office should she have further questions or need instructions printed and mailed to her;

## 2021-01-11 ENCOUNTER — Other Ambulatory Visit: Payer: Self-pay | Admitting: Neurology

## 2021-01-12 NOTE — Telephone Encounter (Signed)
Last OV--07/21/20 Last refill--08/12/20  Please advise

## 2021-01-16 ENCOUNTER — Encounter: Payer: Self-pay | Admitting: Gastroenterology

## 2021-01-16 ENCOUNTER — Other Ambulatory Visit: Payer: Self-pay

## 2021-01-19 ENCOUNTER — Telehealth: Payer: Self-pay | Admitting: Gastroenterology

## 2021-01-19 MED ORDER — GABAPENTIN 100 MG PO CAPS: ORAL_CAPSULE | ORAL | 3 refills | Status: DC

## 2021-01-19 NOTE — Telephone Encounter (Signed)
Please have her seen in office instead of reschedule to see if patient will want to go through the procedure then.

## 2021-01-19 NOTE — Telephone Encounter (Signed)
Spoke with the patient and moved her to an OV.

## 2021-01-19 NOTE — Telephone Encounter (Signed)
Inbound call from patient. Cancel procedure 5/18 due to diarrhea, stomach pains. Do not want the appointment at this moment. Patient rescheduled for 8/1.

## 2021-01-21 ENCOUNTER — Encounter: Payer: Medicare Other | Admitting: Gastroenterology

## 2021-02-10 ENCOUNTER — Ambulatory Visit: Payer: Medicare Other | Admitting: Family Medicine

## 2021-02-11 ENCOUNTER — Other Ambulatory Visit: Payer: Self-pay | Admitting: Internal Medicine

## 2021-02-15 DIAGNOSIS — R197 Diarrhea, unspecified: Secondary | ICD-10-CM | POA: Diagnosis not present

## 2021-02-15 DIAGNOSIS — U071 COVID-19: Secondary | ICD-10-CM | POA: Diagnosis not present

## 2021-02-15 DIAGNOSIS — R5383 Other fatigue: Secondary | ICD-10-CM | POA: Diagnosis not present

## 2021-02-15 DIAGNOSIS — R059 Cough, unspecified: Secondary | ICD-10-CM | POA: Diagnosis not present

## 2021-02-15 DIAGNOSIS — R519 Headache, unspecified: Secondary | ICD-10-CM | POA: Diagnosis not present

## 2021-03-08 ENCOUNTER — Other Ambulatory Visit: Payer: Self-pay | Admitting: Internal Medicine

## 2021-03-27 ENCOUNTER — Ambulatory Visit: Payer: Medicare Other | Admitting: Gastroenterology

## 2021-03-27 ENCOUNTER — Encounter: Payer: Self-pay | Admitting: Gastroenterology

## 2021-03-27 ENCOUNTER — Ambulatory Visit (INDEPENDENT_AMBULATORY_CARE_PROVIDER_SITE_OTHER): Payer: Medicare Other | Admitting: Gastroenterology

## 2021-03-27 VITALS — BP 110/62 | HR 76 | Ht 65.0 in | Wt 179.5 lb

## 2021-03-27 DIAGNOSIS — K589 Irritable bowel syndrome without diarrhea: Secondary | ICD-10-CM

## 2021-03-27 DIAGNOSIS — Z86010 Personal history of colonic polyps: Secondary | ICD-10-CM | POA: Diagnosis not present

## 2021-03-27 DIAGNOSIS — Z800 Family history of malignant neoplasm of digestive organs: Secondary | ICD-10-CM | POA: Diagnosis not present

## 2021-03-27 MED ORDER — NA SULFATE-K SULFATE-MG SULF 17.5-3.13-1.6 GM/177ML PO SOLN: ORAL | 0 refills | Status: DC

## 2021-03-27 NOTE — Progress Notes (Signed)
Michele Monroe    [id number]    [birth year]  Primary Care Physician:Panosh, [name], MD  Referring Physician: Burnis Medin, MD [place],  [place] [address]   Chief complaint:  IBS, family h/o colon cancer  HPI:  75 year old very pleasant female with history of chronic irritable bowel syndrome here for follow-up visit  She is due for colorectal colon cancer screening. Her mother had colon cancer at age 61   She is doing well overall.    Denies any dysphagia, vomiting, melena, BRBPR or abdominal pain.  No loss of appetite or weight loss.   Colonoscopy 07/10/2015 Cecal polyp removed, hemorrhoids otherwise normal exam   Outpatient Encounter Medications as of 03/27/2021  Medication Sig   albuterol (VENTOLIN HFA) 108 (90 Base) MCG/ACT inhaler Inhale 2 puffs by mouth every 6 hours as needed. Need appt   atenolol (TENORMIN) 25 MG tablet Take 1 tablet by mouth every day   atorvastatin (LIPITOR) 20 MG tablet Take 1 tablet by mouth every day   dicyclomine (BENTYL) 20 MG tablet Take 1 tablet (20 mg total) by mouth daily. As needed   gabapentin (NEURONTIN) 100 MG capsule Take 100 mg at bedtime   omeprazole (PRILOSEC) 20 MG capsule Take 1 capsule by mouth every day   rizatriptan (MAXALT) 10 MG tablet Take 1 tablet at the onset of migraine, may repeat in 2 hours. Max of 2 doses per day   tiZANidine (ZANAFLEX) 4 MG tablet TAKE 1/2 TO 1 TABLET BY MOUTH AT BEDTIME AS NEEDED FOR NECK PAIN   traMADol (ULTRAM) 50 MG tablet Take 1 tablet by mouth every 6 hours as needed   carbamide peroxide (DEBROX) 6.5 % OTIC solution Place 5 drops into both ears 2 (two) times daily. (Patient not taking: Reported on 03/27/2021)   No facility-administered encounter medications on file as of 03/27/2021.    Allergies as of 03/27/2021   (No Known Allergies)    Past Medical History:  Diagnosis Date   Adenomatous colon polyp    ALLERGIC RHINITIS 06/30/2010   perennial   Allergy     seasonal allergies   Anxiety    hx of   Arthritis    bilateral hands (R>L)   Coughing up blood 08/13/2012   piece of tissue? will send to path poss from upper airway sinusitis  Final path was "abscess"    Depression    hx of   GERD 06/30/2010   on meds   Headache(784.0) 06/30/2010   migraines   Hepatic steatosis    HYPERLIPIDEMIA 06/30/2010   on meds   HYPERTENSION 06/30/2010   on meds   IBS 06/30/2010   Internal hemorrhoids    Renal lesion    Varicose veins    VITAMIN D DEFICIENCY 06/30/2010    Past Surgical History:  Procedure Laterality Date   BREAST SURGERY     needle bx left breast   COLONOSCOPY  2016   KN-prep good-TA   feet     hammer toe sx   POLYPECTOMY  2016   TA   ROTATOR CUFF REPAIR Right    varicose veins      Family History  Problem Relation Age of Onset   Colon cancer Mother        age 35   Breast cancer Mother    Colon polyps Mother    Arthritis Brother    Other Brother    Colon polyps Brother  Breast cancer Cousin    Esophageal cancer Neg Hx    Stomach cancer Neg Hx    Rectal cancer Neg Hx     Social History   Socioeconomic History   Marital status: Widowed    Spouse name: Not on file   Number of children: 2   Years of education: Not on file   Highest education level: Not on file  Occupational History   Occupation: retired    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       Lives in [place]      3 cups of caffinated coffee per day. Denies soda/tea   Right handed   Social Determinants of Health   Financial Resource Strain: Low Risk    Difficulty of Paying Living Expenses: Not hard at all  Food Insecurity: No Food Insecurity   Worried About Charity fundraiser in the Last Year: Never true   Ran Out of Food in the Last Year: Never true  Transportation Needs: No Transportation Needs   Lack of Transportation (Medical): No   Lack of Transportation (Non-Medical): No  Physical Activity: Insufficiently Active   Days of Exercise per Week: 5 days   Minutes of Exercise per Session: 10 min  Stress: No Stress Concern Present   Feeling of Stress : Not at all  Social Connections: Moderately Isolated   Frequency of Communication with Friends and Family: More than three times a week   Frequency of Social Gatherings with Friends and Family: More than three times a week   Attends Religious Services: 1 to 4 times per year   Active Member of [employer] or Organizations: No   Attends Archivist Meetings: Never   Marital Status: Widowed  Human resources officer Violence: Not At Risk   Fear of Current or Ex-Partner: No   Emotionally Abused: No   Physically Abused: No   Sexually Abused: No      Review of systems: All other review of systems negative except as mentioned in the HPI.   Physical Exam: Vitals:   03/27/21 0938  BP: 110/62  Pulse: 76   Body mass index is 29.87 kg/m. Gen:      No acute distress HEENT:  sclera anicteric Abd:      soft, non-tender; no palpable masses, no distension Ext:    No edema Neuro: alert and oriented x 3 Psych: normal mood and affect  Data Reviewed:  Reviewed labs, radiology imaging, old records and pertinent past GI work up   Assessment and Plan/Recommendations:  75 year old very pleasant female with family history of colon cancer and IBS here to discuss colorectal cancer  screening  Schedule for colonoscopy The risks and benefits as well as  alternatives of endoscopic procedure(s) have been discussed and reviewed. All questions answered. The patient agrees to proceed.  Return as needed after colonoscopy   This visit required 40 minutes of patient care (this includes precharting, chart review, review of results, face-to-face time used for counseling as well as treatment plan and follow-up. The patient was provided an opportunity to ask questions and all were answered. The patient agreed with the plan and demonstrated an understanding of the instructions.    Damaris Hippo , MD    CC: Panosh, [name], MD

## 2021-03-27 NOTE — Patient Instructions (Signed)
You have been scheduled for a colonoscopy. Please follow written instructions given to you at your visit today.  Please pick up your prep supplies at the pharmacy within the next 1-3 days. If you use inhalers (even only as needed), please bring them with you on the day of your procedure.   If you are age 74 or older, your body mass index should be between 23-30. Your Body mass index is 29.87 kg/m. If this is out of the aforementioned range listed, please consider follow up with your Primary Care Provider.  If you are age 44 or younger, your body mass index should be between 19-25. Your Body mass index is 29.87 kg/m. If this is out of the aformentioned range listed, please consider follow up with your Primary Care Provider.   __________________________________________________________  The [facility] providers would like to encourage you to use [facility] to communicate with providers for non-urgent requests or questions.  Due to long hold times on the telephone, sending your provider a message by [facility] may be a faster and more efficient way to get a response.  Please allow 48 business hours for a response.  Please remember that this is for non-urgent requests.    Due to recent changes in healthcare laws, you may see the results of your imaging and laboratory studies on MyChart before your provider has had a chance to review them.  We understand that in some cases there may be results that are confusing or concerning to you. Not all laboratory results come back in the same time frame and the provider may be waiting for multiple results in order to interpret others.  Please give [place] 48 hours in order for your provider to thoroughly review all the results before contacting the office for clarification of your results.    I appreciate the  opportunity to care for you  Thank You   Harl Bowie , MD

## 2021-04-01 ENCOUNTER — Encounter: Payer: Self-pay | Admitting: Gastroenterology

## 2021-04-06 ENCOUNTER — Encounter: Payer: Self-pay | Admitting: Gastroenterology

## 2021-04-06 ENCOUNTER — Other Ambulatory Visit: Payer: Self-pay

## 2021-04-06 ENCOUNTER — Encounter: Payer: Medicare Other | Admitting: Gastroenterology

## 2021-04-06 ENCOUNTER — Ambulatory Visit (AMBULATORY_SURGERY_CENTER): Payer: Medicare Other | Admitting: Gastroenterology

## 2021-04-06 VITALS — BP 142/85 | HR 98 | Temp 97.6°F | Resp 13 | Ht 65.0 in | Wt 179.0 lb

## 2021-04-06 DIAGNOSIS — Z86010 Personal history of colonic polyps: Secondary | ICD-10-CM

## 2021-04-06 DIAGNOSIS — D123 Benign neoplasm of transverse colon: Secondary | ICD-10-CM | POA: Diagnosis not present

## 2021-04-06 DIAGNOSIS — Z800 Family history of malignant neoplasm of digestive organs: Secondary | ICD-10-CM | POA: Diagnosis not present

## 2021-04-06 DIAGNOSIS — Z1211 Encounter for screening for malignant neoplasm of colon: Secondary | ICD-10-CM | POA: Diagnosis not present

## 2021-04-06 MED ORDER — SODIUM CHLORIDE 0.9 % IV SOLN: 500.0000 mL | Freq: Once | INTRAVENOUS | Status: DC

## 2021-04-06 NOTE — Progress Notes (Signed)
Called to room to assist during endoscopic procedure.  Patient ID and intended procedure confirmed with present staff. Received instructions for my participation in the procedure from the performing physician.  

## 2021-04-06 NOTE — Op Note (Signed)
[facility] Patient Name: Michele Monroe Procedure Date: 04/06/2021 12:13 PM MRN: [id number] Endoscopist: Mauri Pole , MD Age: 75 Referring MD:  Date of Birth: [birth year] Gender: Female Account #: [id number] Procedure:                Colonoscopy Indications:              Screening in patient at increased risk: Family                            history of 1st-degree relative with colorectal                            cancer before age 29 years Medicines:                Monitored Anesthesia Care Procedure:                Pre-Anesthesia Assessment:                           - Prior to the procedure, a History and Physical                            was performed, and patient medications and                            allergies were reviewed. The patient's tolerance of                            previous anesthesia was also reviewed. The risks                            and benefits of the procedure and the sedation                            options and risks were discussed with the patient.                            All questions were answered, and informed consent                            was obtained. Prior Anticoagulants: The patient has                            taken no previous anticoagulant or antiplatelet                            agents. ASA Grade Assessment: II - A patient with                            mild systemic disease. After reviewing the risks                            and benefits, the patient was deemed in  satisfactory condition to undergo the procedure.                           After obtaining informed consent, the colonoscope                            was passed under direct vision. Throughout the                            procedure, the patient's blood pressure, pulse, and                            oxygen saturations were monitored continuously. The                            Olympus [id number]-H190DL [id number]  Colonoscope was                            introduced through the anus and advanced to the the                            cecum, identified by appendiceal orifice and                            ileocecal valve. The colonoscopy was performed                            without difficulty. The patient tolerated the                            procedure well. The quality of the bowel                            preparation was excellent. The ileocecal valve,                            appendiceal orifice, and rectum were photographed. Scope In: 1:09:54 PM Scope Out: 1:33:52 PM Scope Withdrawal Time: 0 hours 17 minutes 20 seconds  Total Procedure Duration: 0 hours 23 minutes 58 seconds  Findings:                 The perianal and digital rectal examinations were                            normal.                           A 10 mm polyp was found in the hepatic flexure. The                            polyp was sessile. The polyp was removed with a                            cold snare. Resection and retrieval were complete.  Non-bleeding external and internal hemorrhoids were                            found during retroflexion. The hemorrhoids were                            large. Complications:            No immediate complications. Estimated Blood Loss:     Estimated blood loss was minimal. Impression:               - One 10 mm polyp at the hepatic flexure, removed                            with a cold snare. Resected and retrieved.                           - Non-bleeding external and internal hemorrhoids. Recommendation:           - Patient has a contact number available for                            emergencies. The signs and symptoms of potential                            delayed complications were discussed with the                            patient. Return to normal activities tomorrow.                            Written discharge instructions were provided  to the                            patient.                           - Resume previous diet.                           - Continue present medications.                           - Await pathology results.                           - Repeat colonoscopy in 3 - 5 years for                            surveillance based on pathology results. Mauri Pole, MD 04/06/2021 1:38:36 PM This report has been signed electronically.

## 2021-04-06 NOTE — Patient Instructions (Signed)
Resume previous medications.  1 polyp removed and sent to pathology.  Await pathology for final recommendations.  Handouts on polyps and hemorrhoids given to patient.     YOU HAD AN ENDOSCOPIC PROCEDURE TODAY AT [facility]:   Refer to the procedure report that was given to you for any specific questions about what was found during the examination.  If the procedure report does not answer your questions, please call your gastroenterologist to clarify.  If you requested that your care partner not be given the details of your procedure findings, then the procedure report has been included in a sealed envelope for you to review at your convenience later.  YOU SHOULD EXPECT: Some feelings of bloating in the abdomen. Passage of more gas than usual.  Walking can help get rid of the air that was put into your GI tract during the procedure and reduce the bloating. If you had a lower endoscopy (such as a colonoscopy or flexible sigmoidoscopy) you may notice spotting of blood in your stool or on the toilet paper. If you underwent a bowel prep for your procedure, you may not have a normal bowel movement for a few days.  Please Note:  You might notice some irritation and congestion in your nose or some drainage.  This is from the oxygen used during your procedure.  There is no need for concern and it should clear up in a day or so.  SYMPTOMS TO REPORT IMMEDIATELY:  Following lower endoscopy (colonoscopy or flexible sigmoidoscopy):  Excessive amounts of blood in the stool  Significant tenderness or worsening of abdominal pains  Swelling of the abdomen that is new, acute  Fever of 100F or higher   For urgent or emergent issues, a gastroenterologist can be reached at any hour by calling [phone]. Do not use MyChart messaging for urgent concerns.    DIET:  We do recommend a small meal at first, but then you may proceed to your regular diet.  Drink plenty of fluids but you should avoid  alcoholic beverages for 24 hours.  ACTIVITY:  You should plan to take it easy for the rest of today and you should NOT DRIVE or use heavy machinery until tomorrow (because of the sedation medicines used during the test).    FOLLOW UP: Our staff will call the number listed on your records 48-72 hours following your procedure to check on you and address any questions or concerns that you may have regarding the information given to you following your procedure. If we do not reach you, we will leave a message.  We will attempt to reach you two times.  During this call, we will ask if you have developed any symptoms of COVID 19. If you develop any symptoms (ie: fever, flu-like symptoms, shortness of breath, cough etc.) before then, please call [phone].  If you test positive for Covid 19 in the 2 weeks post procedure, please call and report this information to [place].    If any biopsies were taken you will be contacted by phone or by letter within the next 1-3 weeks.  Please call us at [phone] if you have not heard about the biopsies in 3 weeks.    SIGNATURES/CONFIDENTIALITY: You and/or your care partner have signed paperwork which will be entered into your electronic medical record.  These signatures attest to the fact that that the information above on your After Visit Summary has been reviewed and is understood.  Full responsibility of the  confidentiality of this discharge information lies with you and/or your care-partner.  

## 2021-04-06 NOTE — Progress Notes (Signed)
A and O x3. Report to RN. Tolerated MAC anesthesia well. 

## 2021-04-07 ENCOUNTER — Telehealth: Payer: Self-pay | Admitting: Internal Medicine

## 2021-04-07 ENCOUNTER — Other Ambulatory Visit: Payer: Self-pay | Admitting: Internal Medicine

## 2021-04-07 NOTE — Telephone Encounter (Signed)
I have not seen her in person in well over a year.  Why is she getting physical therapy I do not remember ordering this. She should make in person appointment with any concerns I am out of the office next week.

## 2021-04-07 NOTE — Telephone Encounter (Signed)
PT called to advise that her ankles have been swelling and she has trouble walking. She has tried to elevate them but no luck. PT would like some advise on what to do be it meds or apt.

## 2021-04-07 NOTE — Telephone Encounter (Signed)
OV scheduled for 04/07/2021

## 2021-04-08 ENCOUNTER — Other Ambulatory Visit: Payer: Self-pay

## 2021-04-08 ENCOUNTER — Encounter: Payer: Self-pay | Admitting: Internal Medicine

## 2021-04-08 ENCOUNTER — Telehealth: Payer: Self-pay | Admitting: Gastroenterology

## 2021-04-08 ENCOUNTER — Telehealth: Payer: Self-pay | Admitting: *Deleted

## 2021-04-08 ENCOUNTER — Ambulatory Visit (INDEPENDENT_AMBULATORY_CARE_PROVIDER_SITE_OTHER): Payer: Medicare Other | Admitting: Internal Medicine

## 2021-04-08 VITALS — BP 112/62 | HR 73 | Temp 98.5°F | Ht 65.0 in | Wt 178.2 lb

## 2021-04-08 DIAGNOSIS — I83893 Varicose veins of bilateral lower extremities with other complications: Secondary | ICD-10-CM | POA: Diagnosis not present

## 2021-04-08 DIAGNOSIS — M25472 Effusion, left ankle: Secondary | ICD-10-CM | POA: Diagnosis not present

## 2021-04-08 DIAGNOSIS — R609 Edema, unspecified: Secondary | ICD-10-CM | POA: Diagnosis not present

## 2021-04-08 LAB — BASIC METABOLIC PANEL
BUN: 9 mg/dL (ref 6–23)
CO2: 25 mEq/L (ref 19–32)
Calcium: 9.4 mg/dL (ref 8.4–10.5)
Chloride: 103 mEq/L (ref 96–112)
Creatinine, Ser: 0.87 mg/dL (ref 0.40–1.20)
GFR: 65.9 mL/min (ref >60.00)
Glucose, Bld: 103 mg/dL — ABNORMAL HIGH (ref 70–99)
Potassium: 3.6 mEq/L (ref 3.5–5.1)
Sodium: 138 mEq/L (ref 135–145)

## 2021-04-08 LAB — CBC WITH DIFFERENTIAL/PLATELET
Basophils Absolute: 0.1 10*3/uL (ref 0.0–0.1)
Basophils Relative: 0.7 % (ref 0.0–3.0)
Eosinophils Absolute: 0.4 10*3/uL (ref 0.0–0.7)
Eosinophils Relative: 4 % (ref 0.0–5.0)
HCT: 38.7 % (ref 36.0–46.0)
Hemoglobin: 13 g/dL (ref 12.0–15.0)
Lymphocytes Relative: 29.4 % (ref 12.0–46.0)
Lymphs Abs: 2.9 10*3/uL (ref 0.7–4.0)
MCHC: 33.6 g/dL (ref 30.0–36.0)
MCV: 85 fl (ref 78.0–100.0)
Monocytes Absolute: 0.7 10*3/uL (ref 0.1–1.0)
Monocytes Relative: 7 % (ref 3.0–12.0)
Neutro Abs: 5.8 10*3/uL (ref 1.4–7.7)
Neutrophils Relative %: 58.9 % (ref 43.0–77.0)
Platelets: 245 10*3/uL (ref 150.0–400.0)
RBC: 4.55 Mil/uL (ref 3.87–5.11)
RDW: 13.5 % (ref 11.5–15.5)
WBC: 9.8 10*3/uL (ref 4.0–10.5)

## 2021-04-08 LAB — HEPATIC FUNCTION PANEL
ALT: 14 U/L (ref 0–35)
AST: 17 U/L (ref 0–37)
Albumin: 4 g/dL (ref 3.5–5.2)
Alkaline Phosphatase: 61 U/L (ref 39–117)
Bilirubin, Direct: 0.1 mg/dL (ref 0.0–0.3)
Total Bilirubin: 0.5 mg/dL (ref 0.2–1.2)
Total Protein: 6.9 g/dL (ref 6.0–8.3)

## 2021-04-08 LAB — TSH: TSH: 1.85 u[IU]/mL (ref 0.35–5.50)

## 2021-04-08 LAB — LIPID PANEL
Cholesterol: 162 mg/dL (ref 0–200)
HDL: 49.9 mg/dL (ref >39.00)
LDL Cholesterol: 75 mg/dL (ref 0–99)
NonHDL: 111.76
Total CHOL/HDL Ratio: 3
Triglycerides: 185 mg/dL — ABNORMAL HIGH (ref 0.0–149.0)
VLDL: 37 mg/dL (ref 0.0–40.0)

## 2021-04-08 LAB — T4, FREE: Free T4: 0.97 ng/dL (ref 0.60–1.60)

## 2021-04-08 NOTE — Patient Instructions (Addendum)
The swelling  could  be from  varicose veins gravity and poss old ankle injury . Good news nothing else on exam .   Get blood work today.   We may have you see a vein specialist  to see if  help for swelling.   Fluid pills don't always work .     No evidence of lung or heart problem .  Plan follow up depending on results

## 2021-04-08 NOTE — Telephone Encounter (Signed)
Pt states that her acid reflux has gotten worse. She was told that she was going to have gabapentin prescribed but she read that it can cause swelling of the legs so she wants to know if this is true. Pls call her.

## 2021-04-08 NOTE — Telephone Encounter (Signed)
Follow up call made. 

## 2021-04-08 NOTE — Telephone Encounter (Signed)
Patient returned your follow-up call after her procedure. She stated that the morning the day after her procedure was rough as she woke up with a headache. However, she is feeling fine now.

## 2021-04-08 NOTE — Progress Notes (Signed)
Chief Complaint  Patient presents with   Edema    Patient complains of Ankle swelling, Patient reports that swelling is worse when walking,     HPI: Michele Monroe 75 y.o. come in for problem visit  Last visit a year ago  in person  She called about concern about ongoing swelling in her ankles may be left greater than right after she is up and around a while but denies any pain states that it is not really her feet that are swelling although her legs do feel tired after a while. Does put feet up does not report decreased swelling at that time. States she thinks she should be wearing compression stockings but it so hot.  Remote history in her 32s of some type of varicose vein intervention. Also has had history of ankle injuries in the remote past but at present they do not give out and they do not hurt She asked if she should be on a fluid pill.  Generally doing okay just had colonoscopy 2 days ago with polyp removed not back to regular eating yet does have problems with heartburn has to take omeprazole. ROS: See pertinent positives and negatives per HPI.  Takes Maxalt as needed for headaches is not on gabapentin she stopped because she read about it and did not think she should take it.  Is to monitor sodium in her diet no added salt no chest pain shortness of breath  Past Medical History:  Diagnosis Date   Adenomatous colon polyp    ALLERGIC RHINITIS 06/30/2010   perennial   Allergy    seasonal allergies   Anxiety    hx of   Arthritis    bilateral hands (R>L)   Coughing up blood 08/13/2012   piece of tissue? will send to path poss from upper airway sinusitis  Final path was "abscess"    Depression    hx of   GERD 06/30/2010   on meds   Headache(784.0) 06/30/2010   migraines   Hepatic steatosis    HYPERLIPIDEMIA 06/30/2010   on meds   HYPERTENSION 06/30/2010   on meds   IBS 06/30/2010   Internal hemorrhoids    Renal lesion    Varicose veins    VITAMIN D DEFICIENCY  06/30/2010    Family History  Problem Relation Age of Onset   Colon cancer Mother        age 1   Breast cancer Mother    Colon polyps Mother    Arthritis Brother    Other Brother    Colon polyps Brother    Breast cancer Cousin    Esophageal cancer Neg Hx    Stomach cancer Neg Hx    Rectal cancer Neg Hx     Social History   Socioeconomic History   Marital status: Widowed    Spouse name: Not on file   Number of children: 2   Years of education: Not on file   Highest education level: Not on file  Occupational History   Occupation: retired    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       Lives in [place]      3 cups of caffinated coffee per day. Denies soda/tea   Right handed   Social Determinants of Health   Financial Resource Strain: Low Risk    Difficulty of Paying Living Expenses: Not hard at all  Food Insecurity: No Food Insecurity   Worried About Charity fundraiser in the Last Year: Never true   Ran Out of Food in the Last Year: Never true  Transportation Needs: No Transportation Needs   Lack of Transportation (Medical): No   Lack of Transportation (Non-Medical): No  Physical Activity: Insufficiently Active   Days of Exercise per Week: 5 days   Minutes of Exercise per Session: 10 min  Stress: No Stress Concern Present   Feeling of Stress : Not at all  Social Connections:  Moderately Isolated   Frequency of Communication with Friends and Family: More than three times a week   Frequency of Social Gatherings with Friends and Family: More than three times a week   Attends Religious Services: 1 to 4 times per year   Active Member of [employer] or Organizations: No   Attends Archivist Meetings: Never   Marital Status: Widowed    Outpatient Medications Prior to Visit  Medication Sig Dispense Refill   albuterol (VENTOLIN HFA) 108 (90 Base) MCG/ACT inhaler Inhale 2 puffs by mouth every 6 hours as needed. LAST refill pt needs appt. 8.5 each 0   atenolol (TENORMIN) 25 MG tablet Take 1 tablet by mouth every day 30 tablet 11   atorvastatin (LIPITOR) 20 MG tablet Take 1 tablet by mouth every day 30 tablet 11   carbamide peroxide (DEBROX) 6.5 % OTIC solution Place 5 drops into both ears 2 (two) times daily. 15 mL 0   dicyclomine (BENTYL) 20 MG tablet Take 1 tablet (20 mg total) by mouth daily. As needed 30 tablet 2   gabapentin (NEURONTIN) 100 MG capsule Take 100 mg at bedtime 30 capsule 3   Na Sulfate-K Sulfate-Mg Sulf 17.5-3.13-1.6 GM/177ML SOLN 1 prep kit for colonoscopy 354 mL 0   omeprazole (PRILOSEC) 20 MG capsule Take 1 capsule by mouth every day 10 capsule 11   rizatriptan (MAXALT) 10 MG tablet Take 1 tablet at the onset of migraine, may repeat in 2 hours. Max of 2 doses per day 11 tablet 11   tiZANidine (ZANAFLEX) 4 MG tablet TAKE 1/2 TO 1 TABLET BY MOUTH AT BEDTIME AS NEEDED FOR NECK PAIN 90 tablet 1   traMADol (ULTRAM) 50 MG tablet Take 1 tablet by mouth every 6 hours as needed 15 tablet 2   Facility-Administered Medications Prior to Visit  Medication Dose Route Frequency Provider Last Rate Last Admin   0.9 %  sodium chloride infusion  500 mL Intravenous Once Nandigam, Kavitha V, MD         EXAM:  BP 112/62 (BP Location: Left Arm, Patient Position: Sitting, Cuff Size: Normal)   Pulse 73   Temp 98.5 F (36.9 C) (Oral)   Ht _0  (1.651 m)   Wt 178  lb  3.2 oz (80.8 kg)   SpO2 96%   BMI 29.65 kg/m   Body mass index is 29.65 kg/m.  GENERAL: vitals reviewed and listed above, alert, oriented, appears well hydrated and in no acute distress HEENT: atraumatic, conjunctiva  clear, no obvious abnormalities on inspection of external nose and ears OP :masked  LUNGS: clear to auscultation bilaterally, no wheezes, rales or rhonchi, good air movement CV: HRRR, no clubbing cyanosis or she has a number of superficial varicose veins left leg is puffier than the right as is left ankle area but no bony tenderness redness streaking MS: moves all extremities left lateral malleolar area is larger than the right but no bony tenderness good range of motion and stability. PSYCH: pleasant and cooperative, no obvious depression or anxiety Lab Results  Component Value Date   WBC 8.5 05/02/2019   HGB 13.1 05/02/2019   HCT 38.6 05/02/2019   PLT 229.0 05/02/2019   GLUCOSE 94 05/02/2019   CHOL 168 05/02/2019   TRIG 182.0 (H) 05/02/2019   HDL 53.90 05/02/2019   LDLCALC 77 05/02/2019   ALT 15 05/02/2019   AST 18 05/02/2019   NA 139 05/02/2019   K 3.8 05/02/2019   CL 104 05/02/2019   CREATININE 0.77 05/02/2019   BUN 17 05/02/2019   CO2 26 05/02/2019   TSH 1.59 05/02/2019   BP Readings from Last 3 Encounters:  04/08/21 112/62  04/06/21 (!) 142/85  03/27/21 110/62    ASSESSMENT AND PLAN:  Discussed the following assessment and plan:  Swelling - Plan: Basic metabolic panel, CBC with Differential/Platelet, Hepatic function panel, Lipid panel, TSH, T4, free, T4, free, TSH, Lipid panel, Hepatic function panel, CBC with Differential/Platelet, Basic metabolic panel  Left ankle swelling - Plan: Basic metabolic panel, CBC with Differential/Platelet, Hepatic function panel, Lipid panel, TSH, T4, free, T4, free, TSH, Lipid panel, Hepatic function panel, CBC with Differential/Platelet, Basic metabolic panel  Varicose veins of both legs with edema - Plan: Basic  metabolic panel, CBC with Differential/Platelet, Hepatic function panel, Lipid panel, TSH, T4, free, T4, free, TSH, Lipid panel, Hepatic function panel, CBC with Differential/Platelet, Basic metabolic panel No obvious compromise cardiopulmonary at this time. Update lab Cannot tell if her swelling is related to possibly combination venous insufficiency and old ankle injury but has no ankle symptoms.  Such as giving out in pain none of this situation is acute she had some of this previously to her colonoscopy.  Had a polyp removed on Monday 2 days ago and is doing well. Depending on lab work we can get vascular assessment and consider Ortho sports medicine if needed No x-rays done today. -Patient advised to return or notify health care team  if  new concerns arise.  Patient Instructions  The swelling  could  be from  varicose veins gravity and poss old ankle injury . Good news nothing else on exam .   Get blood work today.   We may have you see a vein specialist  to see if  help for swelling.   Fluid pills don't always work .     No evidence of lung or heart problem .  Plan follow up depending on results    [place]. Dinisha Cai M.D.

## 2021-04-08 NOTE — Telephone Encounter (Signed)
  Follow up Call-  Call back number 04/06/2021  Post procedure Call Back phone  # [phone]  Permission to leave phone message Yes  Some recent data might be hidden    First attempt for follow up phone call. No answer at number given.  Left message on voicemail.

## 2021-04-09 NOTE — Telephone Encounter (Signed)
Called the patient back. No answer. Left her a message on her voicemail.

## 2021-04-10 ENCOUNTER — Telehealth: Payer: Self-pay | Admitting: Internal Medicine

## 2021-04-10 NOTE — Addendum Note (Signed)
Addended by: Nilda Riggs on: 04/10/2021 10:18 AM   Modules accepted: Orders

## 2021-04-10 NOTE — Telephone Encounter (Signed)
Please see result note 

## 2021-04-10 NOTE — Progress Notes (Signed)
Results are normal except slight elevation of triglycerides .  Can be  improved with avoiding simple sugars and  carbs and staying active .   I suggest we do referral to vein and vascular for opinion.  Mykal please do  vein and vascular  referral( dx  edema, ankle swelling)   for opinion  if agrees

## 2021-04-10 NOTE — Telephone Encounter (Signed)
PT called to return the missed call for results. Please advise.

## 2021-04-19 NOTE — Progress Notes (Deleted)
NEUROLOGY FOLLOW UP OFFICE NOTE  Michele Monroe [id number]  Assessment/Plan:   Migraine without aura, without status migrainosus, not intractable   1.  Migraine prevention:  Gabapentin 173m at bedtime 2.  Migraine rescue:  Rizatriptan 158mfor migraine; tramadol for neck pain. 3.  Limit use of pain relievers to no more than 2 days out of week to prevent risk of rebound or medication-overuse headache. 4.  Keep headache diary 5.  Follow up 9 months  Subjective:  Michele KOPEs a [age]ear old right-handed female with hypertension, GERD, IBS, hyperlipidemia who follows up for migraines.   UPDATE: Intensity:  severe Duration:  1 hour Frequency:  4 in past 30 days Abortive therapy:  Rizatriptan 102mor migraines, tramadol for abortive neck and shoulder pain. Muscle relaxant:  Tizanidine 4mg52m bedtime Antihypertensive:  Atenolol 25mg106mly Antiepileptic:  Gabapentin 300mg 69medtime    HISTORY: Onset: 26 yea39 old Location:  Bilateral occipital region Quality:  throbbing Initial intensity:  Starts as 3/10 and progresses to 10/10 She has chronic neck pain that is worse with neck movement.  Migraines often preceded by flare up of neck and shoulder pain. Aura:  no Prodrome:  no Postdrome:  nausea Associated symptoms: Sometimes nausea and vomiting.  Photophobia and phonophobia.  No visual disturbance..  She has not had any new worse headache of her life, waking up from sleep Initial Duration:  Several days without treatment (30 minutes with Maxalt) Initial Frequency:  2 to 3 days per week Initial Frequency of abortive medication: 2 days per week Triggers/exacerbating factors: Neck movement, change in barometric pressure Relieving factors: None Activity:  Aggravates.  Needs to lay down   Past NSAIDS:  Ibuprofen, Aleve, Excedrin Past analgesics:  Excedrin, Tylenol, tramadol Past abortive triptans:  no Past muscle relaxants:  Flexeril Past anti-emetic:  Yes but uncertain of  type Past antihypertensive medications:  no Past antidepressant medications:  Unsure.  Not amitriptyline, nortriptyline or Effexor Past anticonvulsant medications:  topiramate (ineffective) Past vitamins/Herbal/Supplements:  no Other past therapies:  heat and ice, exercises, massage for neck pain   Family history of headache:  Daughter has migraines   Report of an old CT of the head from 06/24/05 (images not currently available for review) demonstrated "two areas of low density in the inferior left basal ganglia" felt to be more likely prominent perivascular spaces rather than old lacunar infarcts.  CT of cervical spine on the same day demonstrated multilevel degenerative changes.  MRI of cervical spine from 06/06/17 was personally reviewed and demonstrated multilevel spondylolisthesis with cervical facet arthropathy with severe neural foraminal stenosis at bilateral C6 and left C7.  She was evaluated by interventional pain management who told her nothing interventional was warranted as it was all arthritic changes.   She denies radicular pain down the arms but notes that she wakes up with bilateral hand numbness, which resolves when she shakes her hands out.  PAST MEDICAL HISTORY: Past Medical History:  Diagnosis Date   Adenomatous colon polyp    ALLERGIC RHINITIS 06/30/2010   perennial   Allergy    seasonal allergies   Anxiety    hx of   Arthritis    bilateral hands (R>L)   Coughing up blood 08/13/2012   piece of tissue? will send to path poss from upper airway sinusitis  Final path was "abscess"    Depression    hx of   GERD 06/30/2010   on meds   Headache(784.0) 06/30/2010  migraines   Hepatic steatosis    HYPERLIPIDEMIA 06/30/2010   on meds   HYPERTENSION 06/30/2010   on meds   IBS 06/30/2010   Internal hemorrhoids    Renal lesion    Varicose veins    VITAMIN D DEFICIENCY 06/30/2010    MEDICATIONS: Current Outpatient Medications on File Prior to Visit  Medication  Sig Dispense Refill   albuterol (VENTOLIN HFA) 108 (90 Base) MCG/ACT inhaler Inhale 2 puffs by mouth every 6 hours as needed. LAST refill pt needs appt. 8.5 each 0   atenolol (TENORMIN) 25 MG tablet Take 1 tablet by mouth every day 30 tablet 11   atorvastatin (LIPITOR) 20 MG tablet Take 1 tablet by mouth every day 30 tablet 11   carbamide peroxide (DEBROX) 6.5 % OTIC solution Place 5 drops into both ears 2 (two) times daily. 15 mL 0   dicyclomine (BENTYL) 20 MG tablet Take 1 tablet (20 mg total) by mouth daily. As needed 30 tablet 2   gabapentin (NEURONTIN) 100 MG capsule Take 100 mg at bedtime 30 capsule 3   Na Sulfate-K Sulfate-Mg Sulf 17.5-3.13-1.6 GM/177ML SOLN 1 prep kit for colonoscopy 354 mL 0   omeprazole (PRILOSEC) 20 MG capsule Take 1 capsule by mouth every day 10 capsule 11   rizatriptan (MAXALT) 10 MG tablet Take 1 tablet at the onset of migraine, may repeat in 2 hours. Max of 2 doses per day 11 tablet 11   tiZANidine (ZANAFLEX) 4 MG tablet TAKE 1/2 TO 1 TABLET BY MOUTH AT BEDTIME AS NEEDED FOR NECK PAIN 90 tablet 1   traMADol (ULTRAM) 50 MG tablet Take 1 tablet by mouth every 6 hours as needed 15 tablet 2   Current Facility-Administered Medications on File Prior to Visit  Medication Dose Route Frequency Provider Last Rate Last Admin   0.9 %  sodium chloride infusion  500 mL Intravenous Once Nandigam, Kavitha V, MD        ALLERGIES: No Known Allergies  FAMILY HISTORY: Family History  Problem Relation Age of Onset   Colon cancer Mother        age 39   Breast cancer Mother    Colon polyps Mother    Arthritis Brother    Other Brother    Colon polyps Brother    Breast cancer Cousin    Esophageal cancer Neg Hx    Stomach cancer Neg Hx    Rectal cancer Neg Hx       Objective:  *** General: No acute distress.  Patient appears well-groomed.   Head:  Normocephalic/atraumatic Eyes:  Fundi examined but not visualized Neck: supple, no paraspinal tenderness, full range of  motion Heart:  Regular rate and rhythm Lungs:  Clear to auscultation bilaterally Back: No paraspinal tenderness Neurological Exam: alert and oriented to person, place, and time.  Speech fluent and not dysarthric, language intact.  CN II-XII intact. Bulk and tone normal, muscle strength 5/5 throughout.  Sensation to light touch intact.  Deep tendon reflexes 2+ throughout, toes downgoing.  Finger to nose testing intact.  Gait normal, Romberg negative.   Adam Jaffe, DO  CC: Wanda Panosh, MD       

## 2021-04-20 ENCOUNTER — Ambulatory Visit: Payer: Medicare Other | Admitting: Neurology

## 2021-04-23 ENCOUNTER — Telehealth: Payer: Self-pay | Admitting: Gastroenterology

## 2021-04-23 NOTE — Telephone Encounter (Signed)
Pt calling wants to know path results.. Plz advise  thank you

## 2021-04-23 NOTE — Telephone Encounter (Signed)
Patient advised her polyps were not cancer, but are considered precancerous. She understands she will receive more recommendations after the pathology report has been reviewed by her provider.

## 2021-04-27 ENCOUNTER — Encounter: Payer: Self-pay | Admitting: Gastroenterology

## 2021-05-04 ENCOUNTER — Telehealth: Payer: Self-pay

## 2021-05-04 DIAGNOSIS — W57XXXA Bitten or stung by nonvenomous insect and other nonvenomous arthropods, initial encounter: Secondary | ICD-10-CM | POA: Diagnosis not present

## 2021-05-04 DIAGNOSIS — S70361A Insect bite (nonvenomous), right thigh, initial encounter: Secondary | ICD-10-CM | POA: Diagnosis not present

## 2021-05-04 NOTE — Telephone Encounter (Signed)
Patient called stating she was bite by a bug on the leg and it is swollen and blue pt wanted to schedule an appt but no appts available so pt stated she would go to Urgent Care pt wants PCP to know.

## 2021-05-07 ENCOUNTER — Other Ambulatory Visit: Payer: Self-pay

## 2021-05-07 DIAGNOSIS — I83893 Varicose veins of bilateral lower extremities with other complications: Secondary | ICD-10-CM

## 2021-05-10 ENCOUNTER — Other Ambulatory Visit: Payer: Self-pay | Admitting: Internal Medicine

## 2021-05-14 ENCOUNTER — Encounter: Payer: Medicare Other | Admitting: Vascular Surgery

## 2021-05-14 ENCOUNTER — Encounter (HOSPITAL_COMMUNITY): Payer: Medicare Other

## 2021-05-15 ENCOUNTER — Ambulatory Visit (HOSPITAL_COMMUNITY)
Admission: RE | Admit: 2021-05-15 | Discharge: 2021-05-15 | Disposition: A | Discharge status: Home / Self Care | Payer: Medicare Other | Source: Ambulatory Visit | Attending: Vascular Surgery | Admitting: Vascular Surgery

## 2021-05-15 ENCOUNTER — Other Ambulatory Visit: Payer: Self-pay

## 2021-05-15 ENCOUNTER — Other Ambulatory Visit: Payer: Self-pay | Admitting: Internal Medicine

## 2021-05-15 ENCOUNTER — Ambulatory Visit: Payer: Medicare Other | Admitting: Physician Assistant

## 2021-05-15 VITALS — BP 113/73 | HR 72 | Temp 97.2°F | Resp 16 | Ht 65.0 in | Wt 179.0 lb

## 2021-05-15 DIAGNOSIS — I872 Venous insufficiency (chronic) (peripheral): Secondary | ICD-10-CM | POA: Diagnosis not present

## 2021-05-15 DIAGNOSIS — I83893 Varicose veins of bilateral lower extremities with other complications: Secondary | ICD-10-CM | POA: Insufficient documentation

## 2021-05-15 NOTE — Progress Notes (Signed)
Office Note     CC:  follow up Requesting Provider:  Burnis Medin, MD  HPI: Michele Monroe is a 75 y.o. (08/22/1946) female who presents for evaluation of left lower extremity pain with varicose veins.  She has had history of bilateral greater saphenous vein ablation and right leg stab phlebectomies by Dr. Donnetta Hutching in 2009.  On the left several months she has noticed an increase in the prominence of her varicose veins of bilateral lower extremities.  She believes her left leg is worse than the right in terms of symptoms including edema, fatigue, heavy, achy, painful varicosities.  She denies history of DVT.  She is wearing knee-high compression daily.  She is elevating her leg when possible during the day.  She is also avoiding prolonged sitting and standing.  She denies tobacco use.  Past Medical History:  Diagnosis Date   Adenomatous colon polyp    ALLERGIC RHINITIS 06/30/2010   perennial   Allergy    seasonal allergies   Anxiety    hx of   Arthritis    bilateral hands (R>L)   Coughing up blood 08/13/2012   piece of tissue? will send to path poss from upper airway sinusitis  Final path was "abscess"    Depression    hx of   GERD 06/30/2010   on meds   Headache(784.0) 06/30/2010   migraines   Hepatic steatosis    HYPERLIPIDEMIA 06/30/2010   on meds   HYPERTENSION 06/30/2010   on meds   IBS 06/30/2010   Internal hemorrhoids    Renal lesion    Varicose veins    VITAMIN D DEFICIENCY 06/30/2010    Past Surgical History:  Procedure Laterality Date   BREAST SURGERY     needle bx left breast   COLONOSCOPY  2016   KN-prep good-TA   feet     hammer toe sx   POLYPECTOMY  2016   TA   ROTATOR CUFF REPAIR Right    varicose veins      Social History   Socioeconomic History   Marital status: Widowed    Spouse name: Not on file   Number of children: 2   Years of education: Not on file   Highest education level: Not on file  Occupational History   Occupation: retired     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       Lives in [place]      3 cups of caffinated coffee per day. Denies soda/tea   Right handed   Social Determinants of Health   Financial Resource Strain: Low Risk    Difficulty of Paying Living Expenses: Not hard at all  Food Insecurity: No Food Insecurity  Worried About Charity fundraiser in the Last Year: Never true   [place] in the Last Year: Never true  Transportation Needs: No Transportation Needs   Lack of Transportation (Medical): No   Lack of Transportation (Non-Medical): No  Physical Activity: Insufficiently Active   Days of Exercise per Week: 5 days   Minutes of Exercise per Session: 10 min  Stress: No Stress Concern Present   Feeling of Stress : Not at all  Social Connections: Moderately Isolated   Frequency of Communication with Friends and Family: More than three times a week   Frequency of Social Gatherings with Friends and Family: More than three times a week   Attends Religious Services: 1 to 4 times per year   Active Member of [employer] or Organizations: No   Attends Archivist Meetings: Never   Marital Status: Widowed  Human resources officer Violence: Not At Risk   Fear of Current or Ex-Partner: No   Emotionally Abused: No   Physically Abused: No   Sexually Abused: No    Family History  Problem Relation Age of Onset   Colon  cancer Mother        age 5   Breast cancer Mother    Colon polyps Mother    Arthritis Brother    Other Brother    Colon polyps Brother    Breast cancer Cousin    Esophageal cancer Neg Hx    Stomach cancer Neg Hx    Rectal cancer Neg Hx     Current Outpatient Medications  Medication Sig Dispense Refill   albuterol (VENTOLIN HFA) 108 (90 Base) MCG/ACT inhaler Inhale 2 puffs by mouth every 6 hours as needed. LAST refill pt needs appt. 8.5 each 0   atenolol (TENORMIN) 25 MG tablet Take 1 tablet by mouth every day 30 tablet 11   atorvastatin (LIPITOR) 20 MG tablet Take 1 tablet by mouth every day 30 tablet 11   carbamide peroxide (DEBROX) 6.5 % OTIC solution Place 5 drops into both ears 2 (two) times daily. 15 mL 0   dicyclomine (BENTYL) 20 MG tablet Take 1 tablet (20 mg total) by mouth daily. As needed 30 tablet 2   gabapentin (NEURONTIN) 100 MG capsule Take 100 mg at bedtime 30 capsule 3   Na Sulfate-K Sulfate-Mg Sulf 17.5-3.13-1.6 GM/177ML SOLN 1 prep kit for colonoscopy 354 mL 0   omeprazole (PRILOSEC) 20 MG capsule Take 1 capsule by mouth every day 10 capsule 11   rizatriptan (MAXALT) 10 MG tablet Take 1 tablet at the onset of migraine, may repeat in 2 hours. Max of 2 doses per day 11 tablet 11   tiZANidine (ZANAFLEX) 4 MG tablet Take one-half to one tablet by mouth at bedtime as needed for neck pain 30 tablet 3   traMADol (ULTRAM) 50 MG tablet Take 1 tablet by mouth every 6 hours as needed 15 tablet 2   Current Facility-Administered Medications  Medication Dose Route Frequency Provider Last Rate Last Admin   0.9 %  sodium chloride infusion  500 mL Intravenous Once Nandigam, Kavitha V, MD        No Known Allergies   REVIEW OF SYSTEMS:   _0  denotes positive finding, _1  denotes negative finding Cardiac  Comments:  Chest pain or chest pressure:    Shortness of breath upon exertion:    Short of breath when lying flat:    Irregular heart rhythm:        Vascular  Pain in  calf, thigh, or hip brought on by ambulation:    Pain in feet at night that wakes you up from your sleep:     Blood clot in your veins:    Leg swelling:         Pulmonary    Oxygen at home:    Productive cough:     Wheezing:         Neurologic    Sudden weakness in arms or legs:     Sudden numbness in arms or legs:     Sudden onset of difficulty speaking or slurred speech:    Temporary loss of vision in one eye:     Problems with dizziness:         Gastrointestinal    Blood in stool:     Vomited blood:         Genitourinary    Burning when urinating:     Blood in urine:        Psychiatric    Major depression:         Hematologic    Bleeding problems:    Problems with blood clotting too easily:        Skin    Rashes or ulcers:        Constitutional    Fever or chills:      PHYSICAL EXAMINATION:  Vitals:   05/15/21 0945  BP: 113/73  Pulse: 72  Resp: 16  Temp: (!) 97.2 F (36.2 C)  TempSrc: Temporal  SpO2: 96%  Weight: 179 lb (81.2 kg)  Height: _0  (1.651 m)    General:  WDWN in NAD; vital signs documented above Gait: Not observed HENT: WNL, normocephalic Pulmonary: normal non-labored breathing , without Rales, rhonchi,  wheezing Cardiac: regular HR Abdomen: soft, NT, no masses Skin: without rashes Vascular Exam/Pulses:  Right Left  Radial 2+ (normal) 2+ (normal)  DP 2+ (normal) 2+ (normal)   Extremities: without ischemic changes, without Gangrene , without cellulitis; without open wounds; spider veins in both ankles; small areas of varicosities in left medial lower leg as well as left anterior thigh Musculoskeletal: no muscle wasting or atrophy  Neurologic: A&O X 3;  No focal weakness or paresthesias are detected Psychiatric:  The pt has Normal affect.   Non-Invasive Vascular Imaging:   L venous reflux study: Recanalized flow noted through left greater saphenous vein Deep venous reflux noted in common femoral vein Negative for  DVT    ASSESSMENT/PLAN:: 75 y.o. female here for evaluation of bilateral lower extremity edema and symptomatic varicosities left more than right leg  -Patient has history of bilateral lower extremity greater saphenous vein ablations performed by Dr. Donnetta Hutching In 2009.  The left lower extremity venous reflux study today demonstrates recanalized flow in the left greater saphenous vein with diameter greater than 5 mm through most of the thigh.  She has been wearing knee-high compression however we measured her and prescribed thigh-high 20 to 30 mm compression to be worn daily.  She will elevate her legs when possible during the day.  She will also avoid prolonged sitting and standing and use NSAIDs for discomfort associated with veins.  She would like to know if her insurance provider would cover an additional laser ablation procedure.  For now we will plan to bring her back in 3 months for evaluation to see if she would be a good candidate for ablation therapy.   Dagoberto Ligas, PA-C [facility] [phone]  Clinic  MD:   Stanford Breed on call

## 2021-05-21 NOTE — Telephone Encounter (Signed)
Do not have enough information to answer this message contact patient and get more information.  Antibiotics are usually used for infection and not prevention.  Can she send in a picture also. May need a virtual visit if more advice then routine care

## 2021-05-23 ENCOUNTER — Other Ambulatory Visit: Payer: Self-pay | Admitting: Internal Medicine

## 2021-05-23 DIAGNOSIS — S70362A Insect bite (nonvenomous), left thigh, initial encounter: Secondary | ICD-10-CM | POA: Diagnosis not present

## 2021-05-23 DIAGNOSIS — S70361A Insect bite (nonvenomous), right thigh, initial encounter: Secondary | ICD-10-CM | POA: Diagnosis not present

## 2021-05-23 DIAGNOSIS — W57XXXA Bitten or stung by nonvenomous insect and other nonvenomous arthropods, initial encounter: Secondary | ICD-10-CM | POA: Diagnosis not present

## 2021-05-31 DIAGNOSIS — Z03818 Encounter for observation for suspected exposure to other biological agents ruled out: Secondary | ICD-10-CM | POA: Diagnosis not present

## 2021-05-31 DIAGNOSIS — R0981 Nasal congestion: Secondary | ICD-10-CM | POA: Diagnosis not present

## 2021-05-31 DIAGNOSIS — R197 Diarrhea, unspecified: Secondary | ICD-10-CM | POA: Diagnosis not present

## 2021-05-31 DIAGNOSIS — R059 Cough, unspecified: Secondary | ICD-10-CM | POA: Diagnosis not present

## 2021-06-07 DIAGNOSIS — J069 Acute upper respiratory infection, unspecified: Secondary | ICD-10-CM | POA: Diagnosis not present

## 2021-06-09 ENCOUNTER — Other Ambulatory Visit: Payer: Self-pay | Admitting: Neurology

## 2021-06-09 ENCOUNTER — Other Ambulatory Visit: Payer: Self-pay | Admitting: Internal Medicine

## 2021-06-09 DIAGNOSIS — Z1231 Encounter for screening mammogram for malignant neoplasm of breast: Secondary | ICD-10-CM

## 2021-06-09 DIAGNOSIS — G43809 Other migraine, not intractable, without status migrainosus: Secondary | ICD-10-CM

## 2021-07-08 ENCOUNTER — Other Ambulatory Visit: Payer: Self-pay

## 2021-07-08 ENCOUNTER — Ambulatory Visit
Admission: RE | Admit: 2021-07-08 | Discharge: 2021-07-08 | Disposition: A | Discharge status: Home / Self Care | Payer: Medicare Other | Source: Ambulatory Visit | Attending: Internal Medicine | Admitting: Internal Medicine

## 2021-07-08 DIAGNOSIS — Z1231 Encounter for screening mammogram for malignant neoplasm of breast: Secondary | ICD-10-CM

## 2021-07-09 ENCOUNTER — Other Ambulatory Visit: Payer: Self-pay | Admitting: Neurology

## 2021-07-09 DIAGNOSIS — G43809 Other migraine, not intractable, without status migrainosus: Secondary | ICD-10-CM

## 2021-07-14 ENCOUNTER — Ambulatory Visit: Payer: Medicare Other | Admitting: Internal Medicine

## 2021-07-15 ENCOUNTER — Telehealth: Payer: Self-pay | Admitting: Internal Medicine

## 2021-07-15 DIAGNOSIS — M2550 Pain in unspecified joint: Secondary | ICD-10-CM

## 2021-07-15 NOTE — Telephone Encounter (Signed)
Okay to do referral diagnosis multiple joint pains unresponsive to conservative therapy.

## 2021-07-15 NOTE — Telephone Encounter (Signed)
Patient called to get referral to [facility] to see Dr.Deveshwar. Patient states that her knee has not gotten better and now her shoulder and hand have started to bother her as well. Patient states that she has went to vein clinic but has not any improvement.    Fax number is [phone]      Please advise

## 2021-07-16 NOTE — Telephone Encounter (Signed)
Referral has been placed. I left a detailed message informing the pt that her referral has been placed and to contact our office with any questions.

## 2021-07-17 ENCOUNTER — Other Ambulatory Visit: Payer: Self-pay | Admitting: Internal Medicine

## 2021-08-05 ENCOUNTER — Telehealth: Payer: Self-pay | Admitting: Internal Medicine

## 2021-08-05 ENCOUNTER — Other Ambulatory Visit: Payer: Self-pay

## 2021-08-05 NOTE — Telephone Encounter (Signed)
Patient brought in paperwork that she needs Dr.Panosh to complete. Paperwork will be placed in folder.     Patient can be contacted at [phone] when paperwork is complete     Please advise

## 2021-08-05 NOTE — Telephone Encounter (Signed)
Handicapp form signed  on your desk

## 2021-08-06 ENCOUNTER — Encounter (HOSPITAL_BASED_OUTPATIENT_CLINIC_OR_DEPARTMENT_OTHER): Payer: Self-pay

## 2021-08-06 ENCOUNTER — Emergency Department (HOSPITAL_BASED_OUTPATIENT_CLINIC_OR_DEPARTMENT_OTHER): Payer: Medicare Other

## 2021-08-06 ENCOUNTER — Other Ambulatory Visit: Payer: Self-pay

## 2021-08-06 ENCOUNTER — Telehealth: Payer: Self-pay | Admitting: Internal Medicine

## 2021-08-06 ENCOUNTER — Other Ambulatory Visit (HOSPITAL_BASED_OUTPATIENT_CLINIC_OR_DEPARTMENT_OTHER): Payer: Self-pay

## 2021-08-06 ENCOUNTER — Emergency Department (HOSPITAL_BASED_OUTPATIENT_CLINIC_OR_DEPARTMENT_OTHER)
Admission: EM | Admit: 2021-08-06 | Discharge: 2021-08-06 | Disposition: A | Discharge status: Home / Self Care | Payer: Medicare Other | Attending: Emergency Medicine | Admitting: Emergency Medicine

## 2021-08-06 ENCOUNTER — Emergency Department (HOSPITAL_BASED_OUTPATIENT_CLINIC_OR_DEPARTMENT_OTHER): Payer: Medicare Other | Admitting: Radiology

## 2021-08-06 DIAGNOSIS — Z87891 Personal history of nicotine dependence: Secondary | ICD-10-CM | POA: Diagnosis not present

## 2021-08-06 DIAGNOSIS — M79661 Pain in right lower leg: Secondary | ICD-10-CM | POA: Diagnosis not present

## 2021-08-06 DIAGNOSIS — M79604 Pain in right leg: Secondary | ICD-10-CM | POA: Diagnosis not present

## 2021-08-06 DIAGNOSIS — I10 Essential (primary) hypertension: Secondary | ICD-10-CM | POA: Diagnosis not present

## 2021-08-06 DIAGNOSIS — Z79899 Other long term (current) drug therapy: Secondary | ICD-10-CM | POA: Diagnosis not present

## 2021-08-06 MED ORDER — PREDNISONE 20 MG PO TABS: 40.0000 mg | ORAL_TABLET | Freq: Every day | ORAL | 0 refills | Status: DC

## 2021-08-06 MED ORDER — PREDNISONE 20 MG PO TABS
40.0000 mg | ORAL_TABLET | Freq: Every day | ORAL | 0 refills | Status: DC
  Filled 2021-08-06: qty 6, 3d supply, fill #0

## 2021-08-06 NOTE — Discharge Instructions (Signed)
The tizanidine may help.  The steroids also hopefully will help with the pain.  Follow-up with your doctors as needed.

## 2021-08-06 NOTE — ED Provider Notes (Signed)
[facility] EMERGENCY DEPT Provider Note   CSN: [id number] Arrival date & time: 08/06/21  5170     History Chief Complaint  Patient presents with   Leg Pain    Michele Monroe is a 75 y.o. female.   Leg Pain Patient with right-sided lower leg pain.  Has had since October.  States it began after she turned 4.  Has not seen a provider yet.  States she has an appointment to see a rheumatologist.  No rheumatologic history but states that a friend of hers saw a doctor for similar symptoms.  Pain is not really there at rest but in the morning when she gets up it is severe.  States she uses a cane to get out of bed now.  No back pain.  No weakness.  No numbness.  States the pain is mostly in the calf.  No chest pain or trouble breathing.  Pain tends to improve throughout the day.    Past Medical History:  Diagnosis Date   Adenomatous colon polyp    ALLERGIC RHINITIS 06/30/2010   perennial   Allergy    seasonal allergies   Anxiety    hx of   Arthritis    bilateral hands (R>L)   Coughing up blood 08/13/2012   piece of tissue? will send to path poss from upper airway sinusitis  Final path was "abscess"    Depression    hx of   GERD 06/30/2010   on meds   Headache(784.0) 06/30/2010   migraines   Hepatic steatosis    HYPERLIPIDEMIA 06/30/2010   on meds   HYPERTENSION 06/30/2010   on meds   IBS 06/30/2010   Internal hemorrhoids    Renal lesion    Varicose veins    VITAMIN D DEFICIENCY 06/30/2010    Patient Active Problem List   Diagnosis Date Noted   Dizziness 05/05/2019   Varicose veins with pain 03/06/2015   Renal lesion 08/10/2014   Recurrent headache 08/10/2014   Hyperlipidemia 08/10/2014   Essential hypertension 08/10/2014   Neck pain on left side 02/08/2014   Allergic rhinitis ? 02/08/2014   Varicose veins of lower extremities with other complications 01/74/9449   Vasomotor flushing 06/19/2013   Varicose veins 08/13/2012   Mild anemia 08/13/2012    Recurrent acute sinusitis 08/13/2012   Bilateral neck pain 08/13/2012   Stress headaches 08/13/2012   Coughing up blood 08/13/2012   Constipation 07/25/2012   Subcutaneous nodule 10/06/2011   VITAMIN D DEFICIENCY 06/30/2010   HYPERLIPIDEMIA 06/30/2010   HYPERTENSION 06/30/2010   ALLERGIC RHINITIS 06/30/2010   GERD 06/30/2010   IBS 06/30/2010   Headache 06/30/2010    Past Surgical History:  Procedure Laterality Date   BREAST CYST ASPIRATION     BREAST SURGERY     needle bx left breast   COLONOSCOPY  2016   KN-prep good-TA   feet     hammer toe sx   POLYPECTOMY  2016   TA   ROTATOR CUFF REPAIR Right    varicose veins       OB History   No obstetric history on file.     Family History  Problem Relation Age of Onset   Colon cancer Mother        age 55   Colon polyps Mother    Breast cancer Cousin    Arthritis Brother    Other Brother    Colon polyps Brother    Esophageal cancer Neg Hx    Stomach  cancer Neg Hx    Rectal cancer Neg Hx     Social History   Tobacco Use   Smoking status: Former    Types: Cigarettes    Quit date: 09/06/1976    Years since quitting: 44.9   Smokeless tobacco: Never  Vaping Use   Vaping Use: Never used  Substance Use Topics   Alcohol use: No    Alcohol/week: 0.0 standard drinks    Comment: rarely   Drug use: No    Home Medications Prior to Admission medications   Medication Sig Start Date End Date Taking? Authorizing Provider  albuterol (VENTOLIN HFA) 108 (90 Base) MCG/ACT inhaler Inhale 2 puffs by mouth every 6 hours as needed. LAST refill pt needs appt. 04/07/21   Panosh, [name], MD  atenolol (TENORMIN) 25 MG tablet Take 1 tablet by mouth every day 11/11/20   Panosh, [name], MD  atorvastatin (LIPITOR) 20 MG tablet Take 1 tablet by mouth every day 07/17/21   Panosh, [name], MD  carbamide peroxide (DEBROX) 6.5 % OTIC solution Place 5 drops into both ears 2 (two) times daily. 10/16/20   Just, Laurita Quint, FNP  dicyclomine  (BENTYL) 20 MG tablet Take 1 tablet (20 mg total) by mouth daily. As needed 04/24/19   Mauri Pole, MD  gabapentin (NEURONTIN) 100 MG capsule Take 100 mg at bedtime 01/19/21   Tomi Likens, Adam R, DO  Na Sulfate-K Sulfate-Mg Sulf 17.5-3.13-1.6 GM/177ML SOLN 1 prep kit for colonoscopy 03/27/21   Mauri Pole, MD  omeprazole (PRILOSEC) 20 MG capsule Take 1 capsule by mouth every day 05/25/21   Panosh, [name], MD  predniSONE (DELTASONE) 20 MG tablet Take 2 tablets (40 mg total) by mouth daily. 08/06/21   Davonna Belling, MD  rizatriptan (MAXALT) 10 MG tablet Take 1 tablet at the onset of migraine, may repeat in 2 hours. Max of 2 doses per day 07/09/21   Pieter Partridge, DO  tiZANidine (ZANAFLEX) 4 MG tablet Take one-half to one tablet by mouth at bedtime as needed for neck pain 05/12/21   Panosh, [name], MD  traMADol Veatrice Bourbon) 50 MG tablet Take 1 tablet by mouth every 6 hours as needed 01/13/21   Pieter Partridge, DO    Allergies    Patient has no known allergies.  Review of Systems   Review of Systems  Constitutional:  Negative for appetite change.  HENT:  Negative for congestion.   Respiratory:  Negative for shortness of breath.   Cardiovascular:  Negative for leg swelling.  Gastrointestinal:  Negative for abdominal pain.  Genitourinary:  Negative for flank pain.  Musculoskeletal:  Positive for gait problem. Negative for joint swelling.  Skin:  Negative for wound.  Neurological:  Negative for weakness.  Psychiatric/Behavioral:  Negative for confusion.    Physical Exam Updated Vital Signs BP 129/72 (BP Location: Right Arm)   Pulse (!) 58   Temp (!) 97.5 F (36.4 C) (Oral)   Resp 18   Ht _0  (1.651 m)   Wt 79.4 kg   SpO2 94%   BMI 29.12 kg/m   Physical Exam Vitals and nursing note reviewed.  HENT:     Head: Atraumatic.  Eyes:     Pupils: Pupils are equal, round, and reactive to light.  Chest:     Chest wall: No tenderness.  Abdominal:     Tenderness: There is no abdominal  tenderness.  Musculoskeletal:        General: Tenderness present.  Cervical back: Neck supple.     Comments: Tenderness over calf on right side.  No real edema.  Dorsalis pedis pulse intact.  Able to flex and extend at the ankle.  No ankle tenderness.  Foot is not cold.  Neurological:     General: No focal deficit present.     Mental Status: She is alert.    ED Results / Procedures / Treatments   Labs (all labs ordered are listed, but only abnormal results are displayed) Labs Reviewed - No data to display  EKG None  Radiology DG Tibia/Fibula Right  Result Date: 08/06/2021 CLINICAL DATA:  Acute right leg pain without known injury. EXAM: RIGHT TIBIA AND FIBULA - 2 VIEW COMPARISON:  None. FINDINGS: There is no evidence of fracture or other focal bone lesions. Soft tissues are unremarkable. IMPRESSION: Negative. Electronically Signed   By: Marijo Conception M.D.   On: 08/06/2021 10:14   US Venous Img Lower Unilateral Right  Result Date: 08/06/2021 CLINICAL DATA:  Right calf pain. EXAM: RIGHT LOWER EXTREMITY VENOUS DOPPLER ULTRASOUND TECHNIQUE: Gray-scale sonography with graded compression, as well as color Doppler and duplex ultrasound were performed to evaluate the lower extremity deep venous systems from the level of the common femoral vein and including the common femoral, femoral, profunda femoral, popliteal and calf veins including the posterior tibial, peroneal and gastrocnemius veins when visible. The superficial great saphenous vein was also interrogated. Spectral Doppler was utilized to evaluate flow at rest and with distal augmentation maneuvers in the common femoral, femoral and popliteal veins. COMPARISON:  None. FINDINGS: Contralateral Common Femoral Vein: Respiratory phasicity is normal and symmetric with the symptomatic side. No evidence of thrombus. Normal compressibility. Common Femoral Vein: No evidence of thrombus. Normal compressibility, respiratory phasicity and response to  augmentation. Saphenofemoral Junction: No evidence of thrombus. Normal compressibility and flow on color Doppler imaging. Profunda Femoral Vein: No evidence of thrombus. Normal compressibility and flow on color Doppler imaging. Femoral Vein: No evidence of thrombus. Normal compressibility, respiratory phasicity and response to augmentation. Popliteal Vein: No evidence of thrombus. Normal compressibility, respiratory phasicity and response to augmentation. Calf Veins: No evidence of thrombus. Normal compressibility and flow on color Doppler imaging. Superficial Great Saphenous Vein: No evidence of thrombus. Normal compressibility. Venous Reflux:  None. Other Findings: No evidence of superficial thrombophlebitis or abnormal fluid collection. IMPRESSION: No evidence of right lower extra deep venous thrombosis. Electronically Signed   By: Aletta Edouard M.D.   On: 08/06/2021 10:35    Procedures Procedures   Medications Ordered in ED Medications - No data to display  ED Course  I have reviewed the triage vital signs and the nursing notes.  Pertinent labs & imaging results that were available during my care of the patient were reviewed by me and considered in my medical decision making (see chart for details).    MDM Rules/Calculators/A&P                           Patient with right calf pain.  Has had over the last couple months.  Worse in the morning.  Improves somewhat during the day.  X-ray reassuring.  Negative Doppler.  Appears more muscular pain.  Will give short course of prednisone to see if it helps.  Has rheumatology follow-up but also may benefit from following with PCP.  Discharge home. Final Clinical Impression(s) / ED Diagnoses Final diagnoses:  Right calf pain    Rx / DC  Orders ED Discharge Orders          Ordered    predniSONE (DELTASONE) 20 MG tablet  Daily,   Status:  Discontinued        08/06/21 1159    predniSONE (DELTASONE) 20 MG tablet  Daily        08/06/21 1226              Davonna Belling, MD 08/07/21 [phone]

## 2021-08-06 NOTE — Telephone Encounter (Signed)
I left a detailed message on pt's cell phone informing the pt that her from has been completed and has been placed in front office for pickup.

## 2021-08-06 NOTE — ED Notes (Signed)
Patient transported to X-ray 

## 2021-08-06 NOTE — Telephone Encounter (Signed)
FYI Pt would like to inform dr Regis Bill she went to er at [place] today pt has trouble walking and per pt she does not have blood clot however they did increase her tizanidine and was told to follow up with md if needed

## 2021-08-06 NOTE — ED Triage Notes (Signed)
Onset October of leg pain.  Back of the right leg radiating down leg.  Worse in last couple of days.

## 2021-08-06 NOTE — Telephone Encounter (Signed)
Can make a follow-up visit but I will not be in the office for the next 2 weeks. Can make an appointment follow-up after that or another provider before then.

## 2021-08-07 ENCOUNTER — Other Ambulatory Visit (HOSPITAL_BASED_OUTPATIENT_CLINIC_OR_DEPARTMENT_OTHER): Payer: Self-pay

## 2021-08-11 ENCOUNTER — Ambulatory Visit (HOSPITAL_BASED_OUTPATIENT_CLINIC_OR_DEPARTMENT_OTHER): Payer: Medicare Other | Admitting: Orthopaedic Surgery

## 2021-08-11 ENCOUNTER — Other Ambulatory Visit: Payer: Self-pay

## 2021-08-11 DIAGNOSIS — M1711 Unilateral primary osteoarthritis, right knee: Secondary | ICD-10-CM

## 2021-08-11 MED ORDER — TRIAMCINOLONE ACETONIDE 40 MG/ML IJ SUSP
80.0000 mg | INTRAMUSCULAR | Status: AC | PRN
Start: 2021-08-11 — End: 2021-08-11
  Administered 2021-08-11: 80 mg via INTRA_ARTICULAR

## 2021-08-11 MED ORDER — LIDOCAINE HCL 1 % IJ SOLN
4.0000 mL | INTRAMUSCULAR | Status: AC | PRN
  Administered 2021-08-11: 4 mL

## 2021-08-11 NOTE — Telephone Encounter (Signed)
I spoke with the pt and she stated she would call the office back to schedule an appointment if needed.

## 2021-08-11 NOTE — Progress Notes (Signed)
Chief Complaint: right knee pain     History of Present Illness:  Michele Monroe is a 75 y.o. female presents with right knee pain now since her birthday in October of this year.  She states that the pain is somewhat diffuse.  She describes the knee is swollen and hard to walk on.  She has had to purchase a cane given the knee pain.  She has been using over-the-counter gels and frankincense on the knee which helps somewhat.  She has been taking some medications which are not helpful.  She was given a Medrol steroid by the ED which did not help significantly.  She is here today requesting an injection of the right knee.  She does have an appointment with the rheumatologist in the upcoming several months    Surgical History:   None  PMH/PSH/Family History/Social History/Meds/Allergies:    Past Medical History:  Diagnosis Date   Adenomatous colon polyp    ALLERGIC RHINITIS 06/30/2010   perennial   Allergy    seasonal allergies   Anxiety    hx of   Arthritis    bilateral hands (R>L)   Coughing up blood 08/13/2012   piece of tissue? will send to path poss from upper airway sinusitis  Final path was "abscess"    Depression    hx of   GERD 06/30/2010   on meds   Headache(784.0) 06/30/2010   migraines   Hepatic steatosis    HYPERLIPIDEMIA 06/30/2010   on meds   HYPERTENSION 06/30/2010   on meds   IBS 06/30/2010   Internal hemorrhoids    Renal lesion    Varicose veins    VITAMIN D DEFICIENCY 06/30/2010   Past Surgical History:  Procedure Laterality Date   BREAST CYST ASPIRATION     BREAST SURGERY     needle bx left breast   COLONOSCOPY  2016   KN-prep good-TA   feet     hammer toe sx   POLYPECTOMY  2016   TA   ROTATOR CUFF REPAIR Right    varicose veins     Social History   Socioeconomic History   Marital status: Widowed    Spouse name: Not on file   Number of children: 2   Years of education: Not on file   Highest education  level: Not on file  Occupational History   Occupation: retired    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       Lives in [place]      3  cups of caffinated coffee per day. Denies soda/tea   Right handed   Social Determinants of Health   Financial Resource Strain: Low Risk    Difficulty of Paying Living Expenses: Not hard at all  Food Insecurity: No Food Insecurity   Worried About Charity fundraiser in the Last Year: Never true   Ran Out of Food in the Last Year: Never true  Transportation Needs: No Transportation Needs   Lack of Transportation (Medical): No   Lack of Transportation (Non-Medical): No  Physical Activity: Insufficiently Active   Days of Exercise per Week: 5 days   Minutes of Exercise per Session: 10 min  Stress: No Stress Concern Present   Feeling of Stress : Not at all  Social Connections: Moderately Isolated   Frequency of Communication with Friends and Family: More than three times a week   Frequency of Social Gatherings with Friends and Family: More than three times a week   Attends Religious Services: 1 to 4 times per year   Active Member of [employer] or Organizations: No   Attends Archivist Meetings: Never   Marital Status: Widowed   Family History  Problem Relation Age of Onset   Colon cancer Mother        age 64   Colon polyps Mother    Breast cancer Cousin     Arthritis Brother    Other Brother    Colon polyps Brother    Esophageal cancer Neg Hx    Stomach cancer Neg Hx    Rectal cancer Neg Hx    No Known Allergies Current Outpatient Medications  Medication Sig Dispense Refill   albuterol (VENTOLIN HFA) 108 (90 Base) MCG/ACT inhaler Inhale 2 puffs by mouth every 6 hours as needed. LAST refill pt needs appt. 8.5 each 0   atenolol (TENORMIN) 25 MG tablet Take 1 tablet by mouth every day 30 tablet 11   atorvastatin (LIPITOR) 20 MG tablet Take 1 tablet by mouth every day 30 tablet 0   carbamide peroxide (DEBROX) 6.5 % OTIC solution Place 5 drops into both ears 2 (two) times daily. 15 mL 0   dicyclomine (BENTYL) 20 MG tablet Take 1 tablet (20 mg total) by mouth daily. As needed 30 tablet 2   gabapentin (NEURONTIN) 100 MG capsule Take 100 mg at bedtime 30 capsule 3   Na Sulfate-K Sulfate-Mg Sulf 17.5-3.13-1.6 GM/177ML SOLN 1 prep kit for colonoscopy 354 mL 0   omeprazole (PRILOSEC) 20 MG capsule Take 1 capsule by mouth every day 30 capsule 1   predniSONE (DELTASONE) 20 MG tablet Take 2 tablets (40 mg total) by mouth daily. 6 tablet 0   rizatriptan (MAXALT) 10 MG tablet Take 1 tablet at the onset of migraine, may repeat in 2 hours. Max of 2 doses per day 11 tablet 0   tiZANidine (ZANAFLEX) 4 MG tablet Take one-half to one tablet by mouth at bedtime as needed for neck pain 30 tablet 3   traMADol (ULTRAM) 50 MG tablet Take 1 tablet by mouth every 6 hours as needed 15 tablet 2   Current Facility-Administered Medications  Medication Dose Route Frequency Provider Last Rate Last Admin   0.9 %  sodium chloride infusion  500 mL Intravenous Once Nandigam, Venia Minks, MD       No results found.  Review of Systems:   A ROS was performed including pertinent positives and negatives as documented in the HPI.  Physical Exam :   Constitutional: NAD and appears  stated age Neurological: Alert and oriented Psych: Appropriate affect and cooperative There were no  vitals taken for this visit.   Comprehensive Musculoskeletal Exam:     Musculoskeletal Exam  Gait Normal  Alignment Normal   Right Left  Inspection Normal Normal  Palpation    Tenderness Diffuse tricompartmental None  Crepitus None None  Effusion Moderate None  Range of Motion    Extension 0 0  Flexion 135 135  Strength    Extension 5/5 5/5  Flexion 5/5 5/5  Ligament Exam     Generalized Laxity No No  Lachman Negative Negative   Pivot Shift Negative Negative  Anterior Drawer Negative Negative  Valgus at 0 Negative Negative  Valgus at 20 Negative Negative  Varus at 0 0 0  Varus at 20   0 0  Posterior Drawer at 90 0 0  Vascular/Lymphatic Exam    Edema None None  Venous Stasis Changes No No  Distal Circulation Normal Normal  Neurologic    Light Touch Sensation Intact Intact  Special Tests:      Imaging:   Xray (3 views right knee): Mild tricompartmental osteoarthritis  I personally reviewed and interpreted the radiographs.   Assessment:   75 year old female with right mild tricompartmental osteoarthritis with pain since October of this year.  At this time given the fact that she has needing a cane and has significant pain involving her right knee I would recommend ultrasound-guided injection of the right knee.  We will plan to perform this today.  Plan :    -Right knee ultrasound-guided injection performed today -She will follow-up as needed should she want an additional    Procedure Note  Patient: Michele Monroe             Date of Birth: Jun 18, 1946           MRN: [id number]             Visit Date: 08/11/2021  Procedures: Visit Diagnoses: No diagnosis found.  Large Joint Inj on 08/11/2021 9:32 AM Indications: pain Details: 22 G 1.5 in needle, ultrasound-guided lateral approach  Arthrogram: No  Medications: 4 mL lidocaine 1 %; 80 mg triamcinolone acetonide 40 MG/ML Outcome: tolerated well, no immediate complications Procedure, treatment alternatives,  risks and benefits explained, specific risks discussed. Consent was given by the patient. Immediately prior to procedure a time out was called to verify the correct patient, procedure, equipment, support staff and site/side marked as required. Patient was prepped and draped in the usual sterile fashion.          I personally saw and evaluated the patient, and participated in the management and treatment plan.  Vanetta Mulders, MD Attending Physician, Orthopedic Surgery  This document was dictated using Dragon voice recognition software. A reasonable attempt at proof reading has been made to minimize errors.

## 2021-08-19 ENCOUNTER — Ambulatory Visit: Payer: Medicare Other | Admitting: Vascular Surgery

## 2021-09-02 NOTE — Progress Notes (Deleted)
Office Visit Note  Patient: Michele Monroe             Date of Birth: 10/18/1945           MRN: [id number]             PCP: Burnis Medin, MD Referring: Burnis Medin, MD Visit Date: 09/14/2021 Occupation: @GUAROCC @  Subjective:  No chief complaint on file.   History of Present Illness: Michele Monroe is a 75 y.o. female ***   Activities of Daily Living:  Patient reports morning stiffness for *** {minute/hour:19697}.   Patient {ACTIONS;DENIES/REPORTS:21021675::"Denies"} nocturnal pain.  Difficulty dressing/grooming: {ACTIONS;DENIES/REPORTS:21021675::"Denies"} Difficulty climbing stairs: {ACTIONS;DENIES/REPORTS:21021675::"Denies"} Difficulty getting out of chair: {ACTIONS;DENIES/REPORTS:21021675::"Denies"} Difficulty using hands for taps, buttons, cutlery, and/or writing: {ACTIONS;DENIES/REPORTS:21021675::"Denies"}  No Rheumatology ROS completed.   PMFS History:  Patient Active Problem List   Diagnosis Date Noted   Dizziness 05/05/2019   Varicose veins with pain 03/06/2015   Renal lesion 08/10/2014   Recurrent headache 08/10/2014   Hyperlipidemia 08/10/2014   Essential hypertension 08/10/2014   Neck pain on left side 02/08/2014   Allergic rhinitis ? 02/08/2014   Varicose veins of lower extremities with other complications 62/70/3500   Vasomotor flushing 06/19/2013   Varicose veins 08/13/2012   Mild anemia 08/13/2012   Recurrent acute sinusitis 08/13/2012   Bilateral neck pain 08/13/2012   Stress headaches 08/13/2012   Coughing up blood 08/13/2012   Constipation 07/25/2012   Subcutaneous nodule 10/06/2011   VITAMIN D DEFICIENCY 06/30/2010   HYPERLIPIDEMIA 06/30/2010   HYPERTENSION 06/30/2010   ALLERGIC RHINITIS 06/30/2010   GERD 06/30/2010   IBS 06/30/2010   Headache 06/30/2010    Past Medical History:  Diagnosis Date   Adenomatous colon polyp    ALLERGIC RHINITIS 06/30/2010   perennial   Allergy    seasonal allergies   Anxiety    hx of   Arthritis     bilateral hands (R>L)   Coughing up blood 08/13/2012   piece of tissue? will send to path poss from upper airway sinusitis  Final path was "abscess"    Depression    hx of   GERD 06/30/2010   on meds   Headache(784.0) 06/30/2010   migraines   Hepatic steatosis    HYPERLIPIDEMIA 06/30/2010   on meds   HYPERTENSION 06/30/2010   on meds   IBS 06/30/2010   Internal hemorrhoids    Renal lesion    Varicose veins    VITAMIN D DEFICIENCY 06/30/2010    Family History  Problem Relation Age of Onset   Colon cancer Mother        age 44   Colon polyps Mother    Breast cancer Cousin    Arthritis Brother    Other Brother    Colon polyps Brother    Esophageal cancer Neg Hx    Stomach cancer Neg Hx    Rectal cancer Neg Hx    Past Surgical History:  Procedure Laterality Date   BREAST CYST ASPIRATION     BREAST SURGERY     needle bx left breast   COLONOSCOPY  2016   KN-prep good-TA   feet     hammer toe sx   POLYPECTOMY  2016   TA   ROTATOR CUFF REPAIR Right    varicose veins     Social History   Social History Narrative   Widower  Husband died 49  PTSD in his sleep   Former smoker in her 20's   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       Lives in [place]      3 cups of caffinated coffee per day. Denies soda/tea   Right handed   Immunization History  Administered Date(s) Administered   Fluad Quad(high Dose 65+) 05/02/2019   Influenza Split 06/05/2012, 05/15/2013   Influenza, High Dose Seasonal PF 04/20/2016, 06/23/2017, 06/19/2018   Influenza,inj,Quad PF,6+ Mos 06/03/2015   Influenza-Unspecified 06/06/2014   Pneumococcal Conjugate-13 08/07/2012   Pneumococcal Polysaccharide-23 09/24/2012   Td 09/07/2007   Zoster, Live 09/24/2012     Objective: Vital Signs: There were no vitals taken for this visit.   Physical Exam   Musculoskeletal Exam: ***  CDAI Exam: CDAI Score: -- Patient  Global: --; Provider Global: -- Swollen: --; Tender: -- Joint Exam 09/14/2021   No joint exam has been documented for this visit   There is currently no information documented on the homunculus. Go to the Rheumatology activity and complete the homunculus joint exam.  Investigation: No additional findings.  Imaging: DG Tibia/Fibula Right  Result Date: 08/06/2021 CLINICAL DATA:  Acute right leg pain without known injury. EXAM: RIGHT TIBIA AND FIBULA - 2 VIEW COMPARISON:  None. FINDINGS: There is no evidence of fracture or other focal bone lesions. Soft tissues are unremarkable. IMPRESSION: Negative. Electronically Signed   By: Marijo Conception M.D.   On: 08/06/2021 10:14   US Venous Img Lower Unilateral Right  Result Date: 08/06/2021 CLINICAL DATA:  Right calf pain. EXAM: RIGHT LOWER EXTREMITY VENOUS DOPPLER ULTRASOUND TECHNIQUE: Gray-scale sonography with graded compression, as well as color Doppler and duplex ultrasound were performed to evaluate the lower extremity deep venous systems from the level of the common femoral vein and including the common femoral, femoral, profunda femoral, popliteal and calf veins including the posterior tibial, peroneal and gastrocnemius veins when visible. The superficial great saphenous vein was also interrogated. Spectral Doppler was utilized to evaluate flow at rest and with distal augmentation maneuvers in the common femoral, femoral and popliteal veins. COMPARISON:  None. FINDINGS: Contralateral Common Femoral Vein: Respiratory phasicity is normal and symmetric with the symptomatic side. No evidence of thrombus. Normal compressibility. Common Femoral Vein: No evidence of thrombus. Normal compressibility, respiratory phasicity and response to augmentation. Saphenofemoral Junction: No evidence of thrombus. Normal compressibility and flow on color Doppler imaging. Profunda Femoral Vein: No evidence of thrombus. Normal compressibility and flow on color Doppler  imaging. Femoral Vein: No evidence of thrombus. Normal compressibility, respiratory phasicity and response to augmentation. Popliteal Vein: No evidence of thrombus. Normal compressibility, respiratory phasicity and response to augmentation. Calf Veins: No evidence of thrombus. Normal compressibility and flow on color Doppler imaging. Superficial Great Saphenous Vein: No evidence of thrombus. Normal compressibility. Venous Reflux:  None. Other Findings: No evidence of superficial thrombophlebitis or abnormal fluid collection. IMPRESSION: No evidence of right lower extra deep venous thrombosis. Electronically Signed   By: Aletta Edouard M.D.   On: 08/06/2021 10:35    Recent Labs: Lab Results  Component Value Date   WBC 9.8 04/08/2021   HGB 13.0 04/08/2021   PLT 245.0 04/08/2021   NA 138 04/08/2021   K 3.6 04/08/2021   CL 103 04/08/2021   CO2 25 04/08/2021   GLUCOSE 103 (H) 04/08/2021   BUN 9 04/08/2021   CREATININE 0.87 04/08/2021   BILITOT 0.5 04/08/2021   ALKPHOS 61  04/08/2021   AST 17 04/08/2021   ALT 14 04/08/2021   PROT 6.9 04/08/2021   ALBUMIN 4.0 04/08/2021   CALCIUM 9.4 04/08/2021    Speciality Comments: No specialty comments available.  Procedures:  No procedures performed Allergies: Patient has no known allergies.   Assessment / Plan:     Visit Diagnoses: Polyarthralgia  Orders: No orders of the defined types were placed in this encounter.  No orders of the defined types were placed in this encounter.   Face-to-face time spent with patient was *** minutes. Greater than 50% of time was spent in counseling and coordination of care.  Follow-Up Instructions: No follow-ups on file.   [name], PA-C  Note - This record has been created using Dragon software.  Chart creation errors have been sought, but may not always  have been located. Such creation errors do not reflect on  the standard of medical care.

## 2021-09-10 DIAGNOSIS — B349 Viral infection, unspecified: Secondary | ICD-10-CM | POA: Diagnosis not present

## 2021-09-12 ENCOUNTER — Other Ambulatory Visit: Payer: Self-pay | Admitting: Internal Medicine

## 2021-09-14 ENCOUNTER — Ambulatory Visit: Payer: Medicare Other | Admitting: Rheumatology

## 2021-09-15 ENCOUNTER — Other Ambulatory Visit: Payer: Self-pay | Admitting: Internal Medicine

## 2021-09-22 ENCOUNTER — Other Ambulatory Visit: Payer: Self-pay | Admitting: Internal Medicine

## 2021-09-29 DIAGNOSIS — H2513 Age-related nuclear cataract, bilateral: Secondary | ICD-10-CM | POA: Diagnosis not present

## 2021-10-01 ENCOUNTER — Other Ambulatory Visit: Payer: Medicare Other

## 2021-10-01 ENCOUNTER — Encounter: Payer: Self-pay | Admitting: Internal Medicine

## 2021-10-01 ENCOUNTER — Telehealth (INDEPENDENT_AMBULATORY_CARE_PROVIDER_SITE_OTHER): Payer: Medicare Other | Admitting: Internal Medicine

## 2021-10-01 DIAGNOSIS — R110 Nausea: Secondary | ICD-10-CM | POA: Diagnosis not present

## 2021-10-01 DIAGNOSIS — R630 Anorexia: Secondary | ICD-10-CM

## 2021-10-01 DIAGNOSIS — Z79899 Other long term (current) drug therapy: Secondary | ICD-10-CM

## 2021-10-01 DIAGNOSIS — G9331 Postviral fatigue syndrome: Secondary | ICD-10-CM | POA: Diagnosis not present

## 2021-10-01 LAB — CBC WITH DIFFERENTIAL/PLATELET
Basophils Absolute: 0.1 10*3/uL (ref 0.0–0.1)
Basophils Relative: 0.5 % (ref 0.0–3.0)
Eosinophils Absolute: 0.2 10*3/uL (ref 0.0–0.7)
Eosinophils Relative: 1.6 % (ref 0.0–5.0)
HCT: 42.8 % (ref 36.0–46.0)
Hemoglobin: 14.2 g/dL (ref 12.0–15.0)
Lymphocytes Relative: 22.6 % (ref 12.0–46.0)
Lymphs Abs: 2.8 10*3/uL (ref 0.7–4.0)
MCHC: 33.2 g/dL (ref 30.0–36.0)
MCV: 84.4 fl (ref 78.0–100.0)
Monocytes Absolute: 0.9 10*3/uL (ref 0.1–1.0)
Monocytes Relative: 6.9 % (ref 3.0–12.0)
Neutro Abs: 8.6 10*3/uL — ABNORMAL HIGH (ref 1.4–7.7)
Neutrophils Relative %: 68.4 % (ref 43.0–77.0)
Platelets: 239 10*3/uL (ref 150.0–400.0)
RBC: 5.07 Mil/uL (ref 3.87–5.11)
RDW: 13.2 % (ref 11.5–15.5)
WBC: 12.6 10*3/uL — ABNORMAL HIGH (ref 4.0–10.5)

## 2021-10-01 LAB — C-REACTIVE PROTEIN: CRP: 1.2 mg/dL (ref 0.5–20.0)

## 2021-10-01 LAB — BASIC METABOLIC PANEL
BUN: 10 mg/dL (ref 6–23)
CO2: 28 mEq/L (ref 19–32)
Calcium: 10 mg/dL (ref 8.4–10.5)
Chloride: 101 mEq/L (ref 96–112)
Creatinine, Ser: 0.83 mg/dL (ref 0.40–1.20)
GFR: 69.5 mL/min (ref >60.00)
Glucose, Bld: 121 mg/dL — ABNORMAL HIGH (ref 70–99)
Potassium: 3.4 mEq/L — ABNORMAL LOW (ref 3.5–5.1)
Sodium: 138 mEq/L (ref 135–145)

## 2021-10-01 LAB — HEPATIC FUNCTION PANEL
ALT: 14 U/L (ref 0–35)
AST: 16 U/L (ref 0–37)
Albumin: 4.2 g/dL (ref 3.5–5.2)
Alkaline Phosphatase: 73 U/L (ref 39–117)
Bilirubin, Direct: 0.1 mg/dL (ref 0.0–0.3)
Total Bilirubin: 0.6 mg/dL (ref 0.2–1.2)
Total Protein: 7.6 g/dL (ref 6.0–8.3)

## 2021-10-01 LAB — URINALYSIS, ROUTINE W REFLEX MICROSCOPIC
Bilirubin Urine: NEGATIVE
Hgb urine dipstick: NEGATIVE
Ketones, ur: NEGATIVE
Nitrite: NEGATIVE
RBC / HPF: NONE SEEN (ref 0–?)
Specific Gravity, Urine: <=1.005 — AB (ref 1.000–1.030)
Total Protein, Urine: NEGATIVE
Urine Glucose: NEGATIVE
Urobilinogen, UA: 0.2 (ref 0.0–1.0)
pH: 6.5 (ref 5.0–8.0)

## 2021-10-01 LAB — SEDIMENTATION RATE: Sed Rate: 16 mm/hr (ref 0–30)

## 2021-10-01 MED ORDER — ONDANSETRON 4 MG PO TBDP: 4.0000 mg | ORAL_TABLET | Freq: Three times a day (TID) | ORAL | 0 refills | Status: DC | PRN

## 2021-10-01 NOTE — Progress Notes (Signed)
Virtual Visit via Video Note  I connected with Michele Monroe on 10/01/21 at 10:30 AM EST by a video enabled telemedicine application and verified that I am speaking with the correct person using two identifiers. Location patient: home Location provider: home office Persons participating in the virtual visit: patient, provider  [place] national recommendations  regarding COVID 19 pandemic   video visit is advised over in office visit for this patient.  Patient aware  of the limitations of evaluation and management by telemedicine and  availability of in person appointments. and agreed to proceed.   HPI: Michele ALLOCCA presents for video   saw UC for sx and now wants  visit here    info not in EHR   Ongoing nausea. Beginning of the year had episode of nausea vomiting and diarrhea she thought was from bad food popcorn shrimp and saw a restaurant however was directed to the urgent care this was not felt to be an outbreak.  There was no associated fever cough other symptoms UTI.  No new medicines.  At urgent care was felt to have a viral illness given did not return for milligrams for just a couple days it did help the nausea but since that time she is not on no meds except her regular omeprazole.  She has continued nausea anorexia decreased appetite "not wanting to eat food ""  Weight loss of only a couple pounds at this time. Needs some help because her appetite is not come back and she has ongoing nausea.  Is not taking ibuprofen or things that can irritate the stomach.   ROS: See pertinent positives and negatives per HPI.  Past Medical History:  Diagnosis Date   Adenomatous colon polyp    ALLERGIC RHINITIS 06/30/2010   perennial   Allergy    seasonal allergies   Anxiety    hx of   Arthritis    bilateral hands (R>L)   Coughing up blood 08/13/2012   piece of tissue? will send to path poss from upper airway sinusitis  Final path was "abscess"    Depression    hx of   GERD 06/30/2010    on meds   Headache(784.0) 06/30/2010   migraines   Hepatic steatosis    HYPERLIPIDEMIA 06/30/2010   on meds   HYPERTENSION 06/30/2010   on meds   IBS 06/30/2010   Internal hemorrhoids    Renal lesion    Varicose veins    VITAMIN D DEFICIENCY 06/30/2010    Past Surgical History:  Procedure Laterality Date   BREAST CYST ASPIRATION     BREAST SURGERY     needle bx left breast   COLONOSCOPY  2016   KN-prep good-TA   feet     hammer toe sx   POLYPECTOMY  2016   TA   ROTATOR CUFF REPAIR Right    varicose veins      Family History  Problem Relation Age of Onset   Colon cancer Mother        age 4   Colon polyps Mother    Breast cancer Cousin    Arthritis Brother    Other Brother    Colon polyps Brother    Esophageal cancer Neg Hx    Stomach cancer Neg Hx    Rectal cancer Neg Hx     Social History   Tobacco Use   Smoking status: Former    Types: Cigarettes    Quit date: 09/06/1976    Years  since quitting: 45.0   Smokeless tobacco: Never  Vaping Use   Vaping Use: Never used  Substance Use Topics   Alcohol use: No    Alcohol/week: 0.0 standard drinks    Comment: rarely   Drug use: No      Current Outpatient Medications:    albuterol (VENTOLIN HFA) 108 (90 Base) MCG/ACT inhaler, Inhale 2 puffs by mouth every 6 hours as needed. LAST refill pt needs appt., Disp: 8.5 each, Rfl: 0   atenolol (TENORMIN) 25 MG tablet, Take 1 tablet (25 mg total) by mouth daily., Disp: 90 tablet, Rfl: 1   atorvastatin (LIPITOR) 20 MG tablet, Take 1 tablet by mouth every day, Disp: 30 tablet, Rfl: 11   carbamide peroxide (DEBROX) 6.5 % OTIC solution, Place 5 drops into both ears 2 (two) times daily., Disp: 15 mL, Rfl: 0   dicyclomine (BENTYL) 20 MG tablet, Take 1 tablet (20 mg total) by mouth daily. As needed, Disp: 30 tablet, Rfl: 2   gabapentin (NEURONTIN) 100 MG capsule, Take 100 mg at bedtime, Disp: 30 capsule, Rfl: 3   Na Sulfate-K Sulfate-Mg Sulf 17.5-3.13-1.6 GM/177ML SOLN, 1  prep kit for colonoscopy, Disp: 354 mL, Rfl: 0   omeprazole (PRILOSEC) 20 MG capsule, Take 1 capsule by mouth every day, Disp: 30 capsule, Rfl: 1   ondansetron (ZOFRAN-ODT) 4 MG disintegrating tablet, Take 1 tablet (4 mg total) by mouth every 8 (eight) hours as needed for nausea or vomiting., Disp: 30 tablet, Rfl: 0   predniSONE (DELTASONE) 20 MG tablet, Take 2 tablets (40 mg total) by mouth daily., Disp: 6 tablet, Rfl: 0   rizatriptan (MAXALT) 10 MG tablet, Take 1 tablet at the onset of migraine, may repeat in 2 hours. Max of 2 doses per day, Disp: 11 tablet, Rfl: 0   tiZANidine (ZANAFLEX) 4 MG tablet, Take one-half to one tablet by mouth at bedtime as needed for neck pain, Disp: 30 tablet, Rfl: 11   traMADol (ULTRAM) 50 MG tablet, Take 1 tablet by mouth every 6 hours as needed, Disp: 15 tablet, Rfl: 2  Current Facility-Administered Medications:    0.9 %  sodium chloride infusion, 500 mL, Intravenous, Once, Nandigam, Kavitha V, MD  EXAM: BP Readings from Last 3 Encounters:  08/06/21 129/72  05/15/21 113/73  04/08/21 112/62   Wt Readings from Last 3 Encounters:  08/06/21 175 lb (79.4 kg)  05/15/21 179 lb (81.2 kg)  04/08/21 178 lb 3.2 oz (80.8 kg)    VITALS per patient if applicable:  GENERAL: alert, oriented, appears well and in no acute distress non icteric   HEENT: atraumatic, conjunttiva clear, no obvious abnormalities on inspection of external nose and ears  NECK: normal movements of the head and neck  LUNGS: on inspection no signs of respiratory distress, breathing rate appears normal, no obvious gross SOB, gasping or wheezing  CV: no obvious cyanosisality  PSYCH/NEURO: pleasant and cooperative, no obvious depression or anxiety, speech and thought processing grossly intact Lab Results  Component Value Date   WBC 9.8 04/08/2021   HGB 13.0 04/08/2021   HCT 38.7 04/08/2021   PLT 245.0 04/08/2021   GLUCOSE 103 (H) 04/08/2021   CHOL 162 04/08/2021   TRIG 185.0 (H)  04/08/2021   HDL 49.90 04/08/2021   LDLCALC 75 04/08/2021   ALT 14 04/08/2021   AST 17 04/08/2021   NA 138 04/08/2021   K 3.6 04/08/2021   CL 103 04/08/2021   CREATININE 0.87 04/08/2021   BUN 9 04/08/2021  CO2 25 04/08/2021   TSH 1.85 04/08/2021    ASSESSMENT AND PLAN:  Discussed the following assessment and plan:    ICD-10-CM   1. Nausea  R11.0 Hepatic function panel    Basic metabolic panel    CBC with Differential/Platelet    Sedimentation rate    C-reactive protein    Urinalysis, Routine w reflex microscopic    2. Decreased appetite  R63.0 Hepatic function panel    Basic metabolic panel    CBC with Differential/Platelet    Sedimentation rate    C-reactive protein    Urinalysis, Routine w reflex microscopic    3. Medication management  Z79.899 Hepatic function panel    Basic metabolic panel    CBC with Differential/Platelet    Sedimentation rate    C-reactive protein    Urinalysis, Routine w reflex microscopic    4. 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 30 for symptomatic relief at this time Get lab appointment for blood work and plan follow-up in. person visit next week Counseled.   Expectant management and discussion of plan and treatment with opportunity to ask questions and all were answered. The patient agreed with the plan and demonstrated an understanding of the instructions.   Advised to call back or seek an in-person evaluation if worsening  or having  further concerns  in interim. 32 minutes  Return for lab   now and visit next week. for fu .    Shanon Ace, MD

## 2021-10-02 NOTE — Progress Notes (Signed)
Lab shows normal kidney and liver tests  , borderline low potassium  and some elevation of WBC which can   be non specific for infection  but no alarming findings  . Please make her appt for next week

## 2021-10-05 ENCOUNTER — Ambulatory Visit: Payer: Medicare Other | Admitting: Rheumatology

## 2021-10-05 ENCOUNTER — Telehealth: Payer: Self-pay | Admitting: Internal Medicine

## 2021-10-05 NOTE — Telephone Encounter (Signed)
Contact her need clinical  triage

## 2021-10-05 NOTE — Telephone Encounter (Signed)
Patient stated she is not getting any better and that she is unable to keep anything on her stomach and if she does eat it gives her diarrhea or turns her urine bright yellow. Patient stated she would keep appt tomorrow but wants a call back.

## 2021-10-05 NOTE — Telephone Encounter (Signed)
Patient called because she would like call to discuss the lab results. Patient states she seen them but does not understand what they mean     Good callback number is [phone]      Please advise

## 2021-10-06 ENCOUNTER — Telehealth (INDEPENDENT_AMBULATORY_CARE_PROVIDER_SITE_OTHER): Payer: Medicare Other | Admitting: Internal Medicine

## 2021-10-06 ENCOUNTER — Encounter: Payer: Self-pay | Admitting: Internal Medicine

## 2021-10-06 DIAGNOSIS — R197 Diarrhea, unspecified: Secondary | ICD-10-CM | POA: Diagnosis not present

## 2021-10-06 DIAGNOSIS — Z79899 Other long term (current) drug therapy: Secondary | ICD-10-CM | POA: Diagnosis not present

## 2021-10-06 DIAGNOSIS — R110 Nausea: Secondary | ICD-10-CM | POA: Diagnosis not present

## 2021-10-06 LAB — POC COVID19 BINAXNOW: SARS Coronavirus 2 Ag: NEGATIVE

## 2021-10-06 NOTE — Progress Notes (Signed)
Virtual Visit via Video Note  I connected with Michele Monroe on 10/06/21 at  1:30 PM EST by a video enabled telemedicine application and verified that I am speaking with the correct person using two identifiers. Location patient: home Location provider:work office Persons participating in the virtual visit: patient, provider  [place] national recommendations  regarding COVID 19 pandemic   video visit is advised over in office visit for this patient.  Patient aware  of the limitations of evaluation and management by telemedicine and  availability of in person appointments. and agreed to proceed.   HPI: Michele Monroe presents for video visit was supposed to be an in person visit but was told by staff that she needed a video visit. Since last week the nausea medicine has been helpful but today she had abdominal cramps when she had activity quite severe and runny diarrhea without fever or blood. Symptoms have been ongoing. No new information.  Denies any fever just feels this is going on too long.  As reported in past no recent antibiotic use no others sick this began after eating popcorn shrimp from restaurant. Did have vomiting of ginger ale this weekend with some ice but otherwise no vomiting able to keep some things down and is maintaining fluid status. She did not do a COVID test this month yet  No one else has been sick with the symptoms. ROS: See pertinent positives and negatives per HPI.  Denies fever and chills  Past Medical History:  Diagnosis Date   Adenomatous colon polyp    ALLERGIC RHINITIS 06/30/2010   perennial   Allergy    seasonal allergies   Anxiety    hx of   Arthritis    bilateral hands (R>L)   Coughing up blood 08/13/2012   piece of tissue? will send to path poss from upper airway sinusitis  Final path was "abscess"    Depression    hx of   GERD 06/30/2010   on meds   Headache(784.0) 06/30/2010   migraines   Hepatic steatosis    HYPERLIPIDEMIA 06/30/2010    on meds   HYPERTENSION 06/30/2010   on meds   IBS 06/30/2010   Internal hemorrhoids    Renal lesion    Varicose veins    VITAMIN D DEFICIENCY 06/30/2010    Past Surgical History:  Procedure Laterality Date   BREAST CYST ASPIRATION     BREAST SURGERY     needle bx left breast   COLONOSCOPY  2016   KN-prep good-TA   feet     hammer toe sx   POLYPECTOMY  2016   TA   ROTATOR CUFF REPAIR Right    varicose veins      Family History  Problem Relation Age of Onset   Colon cancer Mother        age 16   Colon polyps Mother    Breast cancer Cousin    Arthritis Brother    Other Brother    Colon polyps Brother    Esophageal cancer Neg Hx    Stomach cancer Neg Hx    Rectal cancer Neg Hx     Social History   Tobacco Use   Smoking status: Former    Types: Cigarettes    Quit date: 09/06/1976    Years since quitting: 45.1   Smokeless tobacco: Never  Vaping Use   Vaping Use: Never used  Substance Use Topics   Alcohol use: No    Alcohol/week: 0.0 standard drinks  Comment: rarely   Drug use: No      Current Outpatient Medications:    atenolol (TENORMIN) 25 MG tablet, Take 1 tablet (25 mg total) by mouth daily., Disp: 90 tablet, Rfl: 1   ondansetron (ZOFRAN-ODT) 4 MG disintegrating tablet, Take 1 tablet (4 mg total) by mouth every 8 (eight) hours as needed for nausea or vomiting., Disp: 30 tablet, Rfl: 0   albuterol (VENTOLIN HFA) 108 (90 Base) MCG/ACT inhaler, Inhale 2 puffs by mouth every 6 hours as needed. LAST refill pt needs appt., Disp: 8.5 each, Rfl: 0   atorvastatin (LIPITOR) 20 MG tablet, Take 1 tablet by mouth every day, Disp: 30 tablet, Rfl: 11   carbamide peroxide (DEBROX) 6.5 % OTIC solution, Place 5 drops into both ears 2 (two) times daily., Disp: 15 mL, Rfl: 0   dicyclomine (BENTYL) 20 MG tablet, Take 1 tablet (20 mg total) by mouth daily. As needed, Disp: 30 tablet, Rfl: 2   gabapentin (NEURONTIN) 100 MG capsule, Take 100 mg at bedtime, Disp: 30 capsule,  Rfl: 3   Na Sulfate-K Sulfate-Mg Sulf 17.5-3.13-1.6 GM/177ML SOLN, 1 prep kit for colonoscopy, Disp: 354 mL, Rfl: 0   omeprazole (PRILOSEC) 20 MG capsule, Take 1 capsule by mouth every day, Disp: 30 capsule, Rfl: 1   predniSONE (DELTASONE) 20 MG tablet, Take 2 tablets (40 mg total) by mouth daily., Disp: 6 tablet, Rfl: 0   rizatriptan (MAXALT) 10 MG tablet, Take 1 tablet at the onset of migraine, may repeat in 2 hours. Max of 2 doses per day, Disp: 11 tablet, Rfl: 0   tiZANidine (ZANAFLEX) 4 MG tablet, Take one-half to one tablet by mouth at bedtime as needed for neck pain, Disp: 30 tablet, Rfl: 11   traMADol (ULTRAM) 50 MG tablet, Take 1 tablet by mouth every 6 hours as needed, Disp: 15 tablet, Rfl: 2  Current Facility-Administered Medications:    0.9 %  sodium chloride infusion, 500 mL, Intravenous, Once, Nandigam, Kavitha V, MD  EXAM: BP Readings from Last 3 Encounters:  08/06/21 129/72  05/15/21 113/73  04/08/21 112/62    VITALS per patient if applicable:  GENERAL: alert, oriented, appears well and in no acute distress  HEENT: atraumatic, conjunttiva clear, no obvious abnormalities on inspection of external nose and ears  NECK: normal movements of the head and neck  LUNGS: on inspection no signs of respiratory distress, breathing rate appears normal, no obvious gross SOB, gasping or wheezing  CV: no obvious cyanosis  MS: moves all visible extremities without noticeable abnormality  PSYCH/NEURO: pleasant and cooperative, no obvious depression or anxiety, speech and thought processing grossly intact Lab Results  Component Value Date   WBC 12.6 (H) 10/01/2021   HGB 14.2 10/01/2021   HCT 42.8 10/01/2021   PLT 239.0 10/01/2021   GLUCOSE 121 (H) 10/01/2021   CHOL 162 04/08/2021   TRIG 185.0 (H) 04/08/2021   HDL 49.90 04/08/2021   LDLCALC 75 04/08/2021   ALT 14 10/01/2021   AST 16 10/01/2021   NA 138 10/01/2021   K 3.4 (L) 10/01/2021   CL 101 10/01/2021   CREATININE 0.83  10/01/2021   BUN 10 10/01/2021   CO2 28 10/01/2021   TSH 1.85 04/08/2021   Reviewed previous lab work with patient ASSESSMENT AND PLAN:  Discussed the following assessment and plan:    ICD-10-CM   1. Diarrhea of presumed infectious origin  R19.7 Gastrointestinal Pathogen Panel PCR    Fecal lactoferrin, quant    POC COVID-19  Just started again had resolved after initial symptom    2. Nausea  R11.0 Fecal lactoferrin, quant    POC COVID-19    3. Medication management  Z79.899 Gastrointestinal Pathogen Panel PCR    Fecal lactoferrin, quant    Nausea for almost a month and diarrhea 1 day with abdominal cramps. Mild elevation of WBC from acute lab but normal CRP LFTs.  Renal function. What sounds like infectious gastroenteritis is ongoing but some concern with her abdominal cramps.  And the worsening symptoms or recent. Plan stool for GI panel he does not have a container will have to come into the lab to collect Can come to the office and do rapid COVID test here( negative)  Consider empiric antibiotic therapy referral and/or imaging.  Counseled.   Expectant management and discussion of plan and treatment with opportunity to ask questions and all were answered. The patient agreed with the plan and demonstrated an understanding of the instructions.   Advised to call back or seek an in-person evaluation if worsening  or having  further concerns  in interim. Return for depending on results.  Shanon Ace, MD   Covid neg  diarrhea stopped so no stool yet  plan gi appt and ct scan abd pelvis to assess atypical sx  lasting one month

## 2021-10-06 NOTE — Telephone Encounter (Signed)
Pt called back and was transferred to triage nurse

## 2021-10-06 NOTE — Telephone Encounter (Signed)
Spoke with patient and she stated that she is feeling much better and I offered to transfer patient to triage nurse but patient was eating breakfast and stated she would could back.

## 2021-10-07 ENCOUNTER — Telehealth: Payer: Self-pay | Admitting: Internal Medicine

## 2021-10-07 DIAGNOSIS — R197 Diarrhea, unspecified: Secondary | ICD-10-CM

## 2021-10-07 NOTE — Telephone Encounter (Signed)
Noted  

## 2021-10-07 NOTE — Telephone Encounter (Signed)
Fyi Pt is calling just to let md know she has not poop yet but when she does she will turn in sample

## 2021-10-07 NOTE — Telephone Encounter (Signed)
Ok    Please help get a GI appt  with   Dr Silverio Decamp  team  soon for nausea  and ocassional diarrhea for a month  And order a ct of abd pelvis . Tell patient what  is ordered .

## 2021-10-07 NOTE — Telephone Encounter (Signed)
I spoke with the pt and informed her that orders have been placed for CT. Pt has been scheduled with [facility] on 10/15/2021.

## 2021-10-11 ENCOUNTER — Encounter: Payer: Self-pay | Admitting: Internal Medicine

## 2021-10-12 MED ORDER — VALACYCLOVIR HCL 1 G PO TABS: 2000.0000 mg | ORAL_TABLET | Freq: Two times a day (BID) | ORAL | 1 refills | Status: DC

## 2021-10-12 NOTE — Telephone Encounter (Signed)
Sorry you are having such  problems. I f truly a fever blister ulcer, valtrex that you have had in past if taken early may help go away quicker .  I will send in  to your pharmacy  [facility] [address]

## 2021-10-13 ENCOUNTER — Ambulatory Visit (HOSPITAL_BASED_OUTPATIENT_CLINIC_OR_DEPARTMENT_OTHER)
Admission: RE | Admit: 2021-10-13 | Discharge: 2021-10-13 | Disposition: A | Discharge status: Home / Self Care | Payer: Medicare Other | Source: Ambulatory Visit | Attending: Internal Medicine | Admitting: Internal Medicine

## 2021-10-13 ENCOUNTER — Other Ambulatory Visit: Payer: Self-pay

## 2021-10-13 DIAGNOSIS — K5730 Diverticulosis of large intestine without perforation or abscess without bleeding: Secondary | ICD-10-CM | POA: Diagnosis not present

## 2021-10-13 DIAGNOSIS — I700 Atherosclerosis of aorta: Secondary | ICD-10-CM | POA: Insufficient documentation

## 2021-10-13 DIAGNOSIS — K5900 Constipation, unspecified: Secondary | ICD-10-CM | POA: Diagnosis not present

## 2021-10-13 DIAGNOSIS — R197 Diarrhea, unspecified: Secondary | ICD-10-CM | POA: Diagnosis not present

## 2021-10-13 DIAGNOSIS — M8580 Other specified disorders of bone density and structure, unspecified site: Secondary | ICD-10-CM | POA: Diagnosis not present

## 2021-10-13 DIAGNOSIS — D734 Cyst of spleen: Secondary | ICD-10-CM | POA: Insufficient documentation

## 2021-10-13 MED ORDER — IOHEXOL 300 MG/ML  SOLN
85.0000 mL | Freq: Once | INTRAMUSCULAR | Status: AC | PRN
  Administered 2021-10-13: 85 mL via INTRAVENOUS

## 2021-10-14 ENCOUNTER — Other Ambulatory Visit: Payer: Self-pay | Admitting: Internal Medicine

## 2021-10-14 ENCOUNTER — Other Ambulatory Visit (INDEPENDENT_AMBULATORY_CARE_PROVIDER_SITE_OTHER): Payer: Medicare Other

## 2021-10-14 ENCOUNTER — Encounter: Payer: Self-pay | Admitting: Internal Medicine

## 2021-10-14 ENCOUNTER — Other Ambulatory Visit: Payer: Self-pay

## 2021-10-14 DIAGNOSIS — K5900 Constipation, unspecified: Secondary | ICD-10-CM

## 2021-10-14 DIAGNOSIS — Z1211 Encounter for screening for malignant neoplasm of colon: Secondary | ICD-10-CM | POA: Diagnosis not present

## 2021-10-14 MED ORDER — ONDANSETRON 4 MG PO TBDP: 4.0000 mg | ORAL_TABLET | Freq: Three times a day (TID) | ORAL | 0 refills | Status: DC | PRN

## 2021-10-14 NOTE — Telephone Encounter (Signed)
If   you have appt tomorrow with  Gi I would wait on any new RX an go by there advice

## 2021-10-14 NOTE — Progress Notes (Signed)
CT scan shows no obstruction but some constipation but no inflammation in the bowel. However there is evidence of gastritis or inflammation of the stomach. Other findings are incidental that are not significant to your current problem. We need to proceed with a appointment with the gastroenterology team.  Please check and make sure that she has appointment with GI. Can try adding Pepcid twice a day in the short run to see if it helps her stomach symptoms.

## 2021-10-15 ENCOUNTER — Encounter: Payer: Self-pay | Admitting: Physician Assistant

## 2021-10-15 ENCOUNTER — Other Ambulatory Visit (INDEPENDENT_AMBULATORY_CARE_PROVIDER_SITE_OTHER): Payer: Medicare Other

## 2021-10-15 ENCOUNTER — Ambulatory Visit: Payer: Medicare Other | Admitting: Physician Assistant

## 2021-10-15 VITALS — BP 104/60 | HR 80 | Ht 65.0 in | Wt 177.2 lb

## 2021-10-15 DIAGNOSIS — K219 Gastro-esophageal reflux disease without esophagitis: Secondary | ICD-10-CM

## 2021-10-15 DIAGNOSIS — Z86010 Personal history of colonic polyps: Secondary | ICD-10-CM

## 2021-10-15 DIAGNOSIS — R112 Nausea with vomiting, unspecified: Secondary | ICD-10-CM

## 2021-10-15 DIAGNOSIS — K5900 Constipation, unspecified: Secondary | ICD-10-CM

## 2021-10-15 LAB — COMPREHENSIVE METABOLIC PANEL
ALT: 21 U/L (ref 0–35)
AST: 20 U/L (ref 0–37)
Albumin: 3.9 g/dL (ref 3.5–5.2)
Alkaline Phosphatase: 63 U/L (ref 39–117)
BUN: 12 mg/dL (ref 6–23)
CO2: 29 mEq/L (ref 19–32)
Calcium: 9.8 mg/dL (ref 8.4–10.5)
Chloride: 101 mEq/L (ref 96–112)
Creatinine, Ser: 0.88 mg/dL (ref 0.40–1.20)
GFR: 64.77 mL/min (ref >60.00)
Glucose, Bld: 102 mg/dL — ABNORMAL HIGH (ref 70–99)
Potassium: 3.8 mEq/L (ref 3.5–5.1)
Sodium: 137 mEq/L (ref 135–145)
Total Bilirubin: 0.7 mg/dL (ref 0.2–1.2)
Total Protein: 7.3 g/dL (ref 6.0–8.3)

## 2021-10-15 LAB — CBC WITH DIFFERENTIAL/PLATELET
Basophils Absolute: 0.1 10*3/uL (ref 0.0–0.1)
Basophils Relative: 0.6 % (ref 0.0–3.0)
Eosinophils Absolute: 0.3 10*3/uL (ref 0.0–0.7)
Eosinophils Relative: 2.7 % (ref 0.0–5.0)
HCT: 37.2 % (ref 36.0–46.0)
Hemoglobin: 12.5 g/dL (ref 12.0–15.0)
Lymphocytes Relative: 20.2 % (ref 12.0–46.0)
Lymphs Abs: 2.4 10*3/uL (ref 0.7–4.0)
MCHC: 33.5 g/dL (ref 30.0–36.0)
MCV: 83.3 fl (ref 78.0–100.0)
Monocytes Absolute: 1.1 10*3/uL — ABNORMAL HIGH (ref 0.1–1.0)
Monocytes Relative: 9.1 % (ref 3.0–12.0)
Neutro Abs: 8 10*3/uL — ABNORMAL HIGH (ref 1.4–7.7)
Neutrophils Relative %: 67.4 % (ref 43.0–77.0)
Platelets: 201 10*3/uL (ref 150.0–400.0)
RBC: 4.47 Mil/uL (ref 3.87–5.11)
RDW: 13.1 % (ref 11.5–15.5)
WBC: 11.9 10*3/uL — ABNORMAL HIGH (ref 4.0–10.5)

## 2021-10-15 LAB — H. PYLORI ANTIBODY, IGG: H Pylori IgG: NEGATIVE

## 2021-10-15 LAB — FECAL OCCULT BLOOD, IMMUNOCHEMICAL: Fecal Occult Bld: POSITIVE — AB

## 2021-10-15 MED ORDER — POLYETHYLENE GLYCOL 3350 17 G PO PACK: 17.0000 g | PACK | Freq: Every day | ORAL | 3 refills | Status: DC

## 2021-10-15 MED ORDER — BENEFIBER PO POWD: ORAL | 0 refills | Status: DC

## 2021-10-15 MED ORDER — PANTOPRAZOLE SODIUM 40 MG PO TBEC: 40.0000 mg | DELAYED_RELEASE_TABLET | Freq: Every day | ORAL | 3 refills | Status: DC

## 2021-10-15 NOTE — Progress Notes (Signed)
10/15/2021 Michele Monroe [id number] 04/18/1946   ASSESSMENT AND PLAN:   Gastroesophageal reflux disease without esophagitis start Protonix 40 mg daily, stop omeprazole We will get H. pylori antibody, will schedule for EGD with prolonged nausea vomiting, gastritis seen on CT. -     H. pylori antibody, IgG; Future -     pantoprazole (PROTONIX) 40 MG tablet; Take 1 tablet (40 mg total) by mouth daily.  Nausea and vomiting, unspecified vomiting type Pending GI pathogen panel with PCP, will check for H. pylori antibody, repeat CBC since last 1 had leukocytosis, repeat kidney function since last time had hypokalemia. Rule out celiac We will schedule for endoscopy Will schedule EGD to evaluate for possible H. pylori, eosinophilic esophagitis, peptic ulcer disease, or strictures. I discussed risks of EGD with patient today, including risk of sedation, bleeding or perforation.  Patient provides understanding and gave verbal consent to proceed. -     CBC with Differential/Platelet; Future -     Comprehensive metabolic panel; Future -     IgA; Future -     Tissue transglutaminase, IgA; Future -     pantoprazole (PROTONIX) 40 MG tablet; Take 1 tablet (40 mg total) by mouth daily.   Constipation, unspecified constipation type -     polyethylene glycol (MIRALAX / GLYCOLAX) 17 g packet; Take 17 g by mouth daily. -     Wheat Dextrin (BENEFIBER) POWD; Use as directed, daily Likely left lower quadrant pain, intermittent diarrhea is overflow incontinence from constipation, possible component of pelvic floor dysfunction. We will suggest MiraLAX with Benefiber at this time, squatty potty, discussed pelvic floor physical therapy which is a potential option in the future. Colonoscopy 04/2021, due 04/2024  Personal history of colonic polyps Colonoscopy 04/06/2021 that showed 10 mm polyp at hepatic flexure, nonbleeding external and internal hemorrhoids.  Pathology showed tubular adenoma without dysplasia,  recall 3 years (04/2024)   Future Appointments  Date Time Provider [place]  10/16/2021  9:15 AM Vanetta Mulders, MD [facility]  10/27/2021  9:15 AM [facility]-NURSE HEALTH ADVISOR 2 [facility]  11/06/2021  8:45 AM Bo Merino, MD [facility] None  12/04/2021 11:00 AM Bo Merino, MD [facility] None  02/10/2022 10:10 AM Pieter Partridge, DO [facility] None    History of Present Illness:  76 y.o. female presents for evaluation of nausea and diarrhea. She has medical history significant for anxiety, depression, migraines, hypertension, hyperlipidemia, vitamin D deficiency, personal history of adenomatous colon polyps with family history of colon cancer at age 82.   Patient was last seen in the office by Dr. Silverio Decamp 03/27/2021 for chronic irritable bowel syndrome.  Patient is on dicyclomine 20 mg as needed Colonoscopy 04/06/2021 that showed 10 mm polyp at hepatic flexure, nonbleeding external and internal hemorrhoids.  Pathology showed tubular adenoma without dysplasia, recall 3 years (04/2024)  Since that time patient had a video visit with primary care in regards to diarrhea, nausea, states started Jan 5th after eating popcorn shrimp/cole slaw- the following morning she had nausea/vomiting the following morning for 2 days, tested like slaw.  Has BM daily but will have intermittent diarrhea.  She went to doctor 1 week later told had viral infection, given 3 pills for zofran at walk in clinic.  Then for 2-3 weeks she had nausea and vomiting with any food, ie dry toast with peanut butter. Was having GERD symptoms, not helps so got on omeprazole once a day in the morning, added [place] and with zofran pills this  has helped but she continues to have decreased appetite.  She states normally she has constipation, after CT barium she had larger bowel movements were rubbery and then 1 episode of diarrhea.  She has not had a BM after to this. She has lower AB pain with BM, cramping pain.   Patient denied  antibiotic use, denied sick contacts. Patient had positive fecal occult blood at PCP COVID-negative Pending fecal lactoferrin 10 and GI pathogen panel- dropped off on 02/07.  She had a CT scan 10/13/2021 that showed constipation with fecal retention in the ascending and transverse colon, diverticula without diverticulitis, diverticula within the duodenal wall, gastritis, pelvic floor relaxation, umbilical and inguinal fat hernias. Labs showed leukocytosis 12.6, no anemia hemoglobin 14.2, potassium 3.4, BUN 10, creatinine 0.83, AST 16, ALT 14, total bilirubin 2.6.  GI studies CT scan 10/13/2021   constipation with fecal retention in the ascending and transverse colon, diverticula without diverticulitis, diverticula within the duodenal wall, gastritis, pelvic floor relaxation, umbilical and inguinal fat hernias.  Colonoscopy 04/06/2021 that showed 10 mm polyp at hepatic flexure, nonbleeding external and internal hemorrhoids.  Pathology showed tubular adenoma without dysplasia, recall 3 years (04/2024)  Colonoscopy 07/10/2015 Cecal polyp removed, hemorrhoids otherwise normal exam  CT Abdomen Pelvis W Contrast  Result Date: 10/13/2021 CLINICAL DATA:  Nausea and diarrhea x1 month.  Decreased appetite. EXAM: CT ABDOMEN AND PELVIS WITH CONTRAST TECHNIQUE: Multidetector CT imaging of the abdomen and pelvis was performed using the standard protocol following bolus administration of intravenous contrast. RADIATION DOSE REDUCTION: This exam was performed according to the departmental dose-optimization program which includes automated exposure control, adjustment of the mA and/or kV according to patient size and/or use of iterative reconstruction technique. CONTRAST:  37mL OMNIPAQUE IOHEXOL 300 MG/ML  SOLN COMPARISON:  None. FINDINGS: Lower chest: No acute abnormality. Hepatobiliary: No focal liver abnormality is seen. No gallstones, gallbladder wall thickening, or biliary dilatation. Pancreas: Unremarkable. No  pancreatic ductal dilatation or surrounding inflammatory changes. Spleen: There is no mass enhancement or splenomegaly. There is a 1.7 cm cyst of the posteromedial aspect of the spleen measuring 9.8 Hounsfield units. Adrenals/Urinary Tract: There is no adrenal mass, no focal abnormality in the bilateral renal cortex and no evidence of urinary stones or obstruction. There is no bladder thickening. Both ureters insert low in the pelvis consistent with pelvic floor relaxation, without evidence of bladder prolapse. Stomach/Bowel: There are moderate thickened folds of the stomach. Minimally small hiatal hernia. The small bowel fills normally without evidence of obstruction or inflammation. There is a 3 cm diverticulum of the distal descending duodenum, with air contrast level and thin wall. The appendix is normal. There is moderate to severe stool retention in the ascending and transverse colon mild fecal stasis in the left colon, scattered left colon diverticula without evidence of colitis or diverticulitis. There are undigested medication tablets in the distal descending and sigmoid colon. Vascular/Lymphatic: Aortic atherosclerosis. No enlarged abdominal or pelvic lymph nodes. Reproductive: Uterus and bilateral adnexa are unremarkable. Other: There are small umbilical and inguinal fat hernias. There is no free air, hemorrhage or fluid. Musculoskeletal: There is osteopenia with degenerative changes in the thoracic and lumbar spine, slight lumbar levoscoliosis. No destructive bone lesions. IMPRESSION: 1. Constipation with fecal retention greatest in the ascending and transverse colon, with left colonic diverticula without evidence of acute diverticulitis or colitis. 2. No small bowel obstruction or inflammation. 3 cm descending duodenal thin walled diverticulum. 3. Probable gastritis. 4. Small splenic cyst. 5. Low pelvic ureteral insertions  consistent with pelvic floor relaxation. No upstream hydroureteronephrosis or  bladder prolapse. 6. Umbilical and inguinal fat hernias. 7. Aortic atherosclerosis. 8. Osteopenia and degenerative change. Electronically Signed   By: Telford Nab M.D.   On: 10/13/2021 22:24    Current Medications, Allergies, Past Medical History, Past Surgical History, Family History and Social History were reviewed in [employer] record.   Physical Exam: BP 104/60    Pulse 80    Ht 5\' 5"  (1.651 m)    Wt 177 lb 3.2 oz (80.4 kg)    BMI 29.49 kg/m  General:   Pleasant, well developed female in no acute distress Eyes: sclerae anicteric,conjunctive pink  Heart:  regular rate and rhythm Pulm: Clear anteriorly; no wheezing Abdomen:  Soft, Obese AB, skin exam normal, Normal bowel sounds. mild tenderness in the epigastrium and in the LLQ. Without guarding and Without rebound, without hepatomegaly. Extremities:  Without edema. Peripheral pulses intact.  Neurologic:  Alert and  oriented x4;  grossly normal neurologically. Skin:   Dry and intact without significant lesions or rashes. Psychiatric: Demonstrates good judgement and reason without abnormal affect or behaviors.  Vladimir Crofts, PA-C 10/15/21

## 2021-10-15 NOTE — Patient Instructions (Addendum)
If you are age 75 or older, your body mass index should be between 23-30. Your Body mass index is 29.49 kg/m. If this is out of the aforementioned range listed, please consider follow up with your Primary Care Provider. ________________________________________________________  The [facility] providers would like to encourage you to use [facility] to communicate with providers for non-urgent requests or questions.  Due to long hold times on the telephone, sending your provider a message by [facility] may be a faster and more efficient way to get a response.  Please allow 48 business hours for a response.  Please remember that this is for non-urgent requests.  _______________________________________________________  Michele Monroe have been scheduled for an endoscopy. Please follow written instructions given to you at your visit today. If you use inhalers (even only as needed), please bring them with you on the day of your procedure.  Your provider has requested that you go to the basement level for lab work before leaving today. Press "B" on the elevator. The lab is located at the first door on the left as you exit the elevator.  STOP Omeprazole START Pantoprazole 40 mg  Follow up pending the results of your Endoscopy.  Thank you for entrusting me with your care and choosing [facility].  Vicie Mutters, PA-C  Recommend starting on a fiber supplement, can try metamucil first but if this causes gas/bloating switch to benefiber- Take with with a full 8 oz glass of water once a day. This can take 1 month to start helping, so try for at least one month.  Recommend increasing water and activity.  Miralax is an osmotic laxative.  It only brings more water into the stool.  This is safe to take daily.  Can take up to 17 gram of miralax twice a day.  Mix with juice or coffee.  Start 1 capful at night for 3-4 days and reassess your response in 3-4 days.  You can increase and decrease the dose based on your  response.  Remember, it can take up to 3-4 days to take effect OR for the effects to wear off.   I often pair this with benefiber in the morning to help assure the stool is not too loose.   Get a squatty potty to use at home or try a stool, goal is to get your knees above your hips during a bowel movement. Possible pelvic floor dysfunction  Go to the ER if unable to pass gas, severe AB pain, unable to hold down food, any shortness of breath of chest pain.   Stop omeprazole and start the protonix.  Will check labs  Constipation, Adult Constipation is when a person has fewer than three bowel movements in a week, has difficulty having a bowel movement, or has stools (feces) that are dry, hard, or larger than normal. Constipation may be caused by an underlying condition. It may become worse with age if a person takes certain medicines and does not take in enough fluids. Follow these instructions at home: Eating and drinking  Eat foods that have a lot of fiber, such as beans, whole grains, and fresh fruits and vegetables. Limit foods that are low in fiber and high in fat and processed sugars, such as fried or sweet foods. These include french fries, hamburgers, cookies, candies, and soda. Drink enough fluid to keep your urine pale yellow. General instructions Exercise regularly or as told by your health care provider. Try to do 150 minutes of moderate exercise each week.  Use the bathroom when you have the urge to go. Do not hold it in. Take over-the-counter and prescription medicines only as told by your health care provider. This includes any fiber supplements. During bowel movements: Practice deep breathing while relaxing the lower abdomen. Practice pelvic floor relaxation. Watch your condition for any changes. Let your health care provider know about them. Keep all follow-up visits as told by your health care provider. This is important. Contact a health care provider if: You have pain  that gets worse. You have a fever. You do not have a bowel movement after 4 days. You vomit. You are not hungry or you lose weight. You are bleeding from the opening between the buttocks (anus). You have thin, pencil-like stools. Get help right away if: You have a fever and your symptoms suddenly get worse. You leak stool or have blood in your stool. Your abdomen is bloated. You have severe pain in your abdomen. You feel dizzy or you faint. Summary Constipation is when a person has fewer than three bowel movements in a week, has difficulty having a bowel movement, or has stools (feces) that are dry, hard, or larger than normal. Eat foods that have a lot of fiber, such as beans, whole grains, and fresh fruits and vegetables. Drink enough fluid to keep your urine pale yellow. Take over-the-counter and prescription medicines only as told by your health care provider. This includes any fiber supplements. This information is not intended to replace advice given to you by your health care provider. Make sure you discuss any questions you have with your health care provider. Document Revised: 07/11/2019 Document Reviewed: 07/11/2019 Elsevier Patient Education  [place].

## 2021-10-16 ENCOUNTER — Emergency Department (HOSPITAL_BASED_OUTPATIENT_CLINIC_OR_DEPARTMENT_OTHER)
Admission: EM | Admit: 2021-10-16 | Discharge: 2021-10-16 | Disposition: A | Discharge status: Home / Self Care | Payer: Medicare Other | Attending: Emergency Medicine | Admitting: Emergency Medicine

## 2021-10-16 ENCOUNTER — Ambulatory Visit (HOSPITAL_BASED_OUTPATIENT_CLINIC_OR_DEPARTMENT_OTHER)
Admission: RE | Admit: 2021-10-16 | Discharge: 2021-10-16 | Disposition: A | Discharge status: Home / Self Care | Payer: Medicare Other | Source: Ambulatory Visit | Attending: Orthopaedic Surgery | Admitting: Orthopaedic Surgery

## 2021-10-16 ENCOUNTER — Emergency Department (HOSPITAL_BASED_OUTPATIENT_CLINIC_OR_DEPARTMENT_OTHER): Payer: Medicare Other

## 2021-10-16 ENCOUNTER — Ambulatory Visit (HOSPITAL_BASED_OUTPATIENT_CLINIC_OR_DEPARTMENT_OTHER): Payer: Medicare Other | Admitting: Orthopaedic Surgery

## 2021-10-16 ENCOUNTER — Other Ambulatory Visit: Payer: Self-pay

## 2021-10-16 ENCOUNTER — Telehealth: Payer: Self-pay | Admitting: Orthopaedic Surgery

## 2021-10-16 ENCOUNTER — Encounter (HOSPITAL_BASED_OUTPATIENT_CLINIC_OR_DEPARTMENT_OTHER): Payer: Self-pay

## 2021-10-16 ENCOUNTER — Other Ambulatory Visit (HOSPITAL_BASED_OUTPATIENT_CLINIC_OR_DEPARTMENT_OTHER): Payer: Self-pay | Admitting: Orthopaedic Surgery

## 2021-10-16 ENCOUNTER — Telehealth: Payer: Self-pay | Admitting: Internal Medicine

## 2021-10-16 DIAGNOSIS — G8929 Other chronic pain: Secondary | ICD-10-CM

## 2021-10-16 DIAGNOSIS — M25511 Pain in right shoulder: Secondary | ICD-10-CM | POA: Insufficient documentation

## 2021-10-16 DIAGNOSIS — M1711 Unilateral primary osteoarthritis, right knee: Secondary | ICD-10-CM | POA: Insufficient documentation

## 2021-10-16 DIAGNOSIS — I10 Essential (primary) hypertension: Secondary | ICD-10-CM | POA: Diagnosis not present

## 2021-10-16 DIAGNOSIS — M25562 Pain in left knee: Secondary | ICD-10-CM | POA: Diagnosis not present

## 2021-10-16 DIAGNOSIS — I82411 Acute embolism and thrombosis of right femoral vein: Secondary | ICD-10-CM | POA: Diagnosis not present

## 2021-10-16 DIAGNOSIS — M7989 Other specified soft tissue disorders: Secondary | ICD-10-CM | POA: Diagnosis not present

## 2021-10-16 DIAGNOSIS — M79661 Pain in right lower leg: Secondary | ICD-10-CM | POA: Diagnosis present

## 2021-10-16 LAB — CBC WITH DIFFERENTIAL/PLATELET
Abs Immature Granulocytes: 0.05 10*3/uL (ref 0.00–0.07)
Basophils Absolute: 0.1 10*3/uL (ref 0.0–0.1)
Basophils Relative: 1 %
Eosinophils Absolute: 0.2 10*3/uL (ref 0.0–0.5)
Eosinophils Relative: 2 %
HCT: 38.3 % (ref 36.0–46.0)
Hemoglobin: 12.4 g/dL (ref 12.0–15.0)
Immature Granulocytes: 1 %
Lymphocytes Relative: 13 %
Lymphs Abs: 1.4 10*3/uL (ref 0.7–4.0)
MCH: 27.7 pg (ref 26.0–34.0)
MCHC: 32.4 g/dL (ref 30.0–36.0)
MCV: 85.7 fL (ref 80.0–100.0)
Monocytes Absolute: 0.6 10*3/uL (ref 0.1–1.0)
Monocytes Relative: 5 %
Neutro Abs: 8.2 10*3/uL — ABNORMAL HIGH (ref 1.7–7.7)
Neutrophils Relative %: 78 %
Platelets: 175 10*3/uL (ref 150–400)
RBC: 4.47 MIL/uL (ref 3.87–5.11)
RDW: 12.7 % (ref 11.5–15.5)
WBC: 10.3 10*3/uL (ref 4.0–10.5)
nRBC: 0 % (ref 0.0–0.2)

## 2021-10-16 LAB — BASIC METABOLIC PANEL
Anion gap: 8 (ref 5–15)
BUN: 7 mg/dL — ABNORMAL LOW (ref 8–23)
CO2: 26 mmol/L (ref 22–32)
Calcium: 9.8 mg/dL (ref 8.9–10.3)
Chloride: 103 mmol/L (ref 98–111)
Creatinine, Ser: 0.74 mg/dL (ref 0.44–1.00)
GFR, Estimated: >60 mL/min (ref >60)
Glucose, Bld: 194 mg/dL — ABNORMAL HIGH (ref 70–99)
Potassium: 3.9 mmol/L (ref 3.5–5.1)
Sodium: 137 mmol/L (ref 135–145)

## 2021-10-16 LAB — IGA: Immunoglobulin A: 359 mg/dL — ABNORMAL HIGH (ref 70–320)

## 2021-10-16 LAB — TISSUE TRANSGLUTAMINASE, IGA: (tTG) Ab, IgA: <1 U/mL

## 2021-10-16 MED ORDER — APIXABAN 2.5 MG PO TABS
10.0000 mg | ORAL_TABLET | Freq: Once | ORAL | Status: AC
  Administered 2021-10-16: 10 mg via ORAL
  Filled 2021-10-16: qty 4

## 2021-10-16 MED ORDER — APIXABAN (ELIQUIS) EDUCATION KIT FOR DVT/PE PATIENTS: PACK | Freq: Once | Status: AC

## 2021-10-16 MED ORDER — TRIAMCINOLONE ACETONIDE 40 MG/ML IJ SUSP
80.0000 mg | INTRAMUSCULAR | Status: AC | PRN
  Administered 2021-10-16: 80 mg via INTRA_ARTICULAR

## 2021-10-16 MED ORDER — LIDOCAINE HCL 1 % IJ SOLN
4.0000 mL | INTRAMUSCULAR | Status: AC | PRN
  Administered 2021-10-16: 4 mL

## 2021-10-16 MED ORDER — APIXABAN 5 MG PO TABS: ORAL_TABLET | ORAL | 0 refills | Status: DC

## 2021-10-16 NOTE — Discharge Instructions (Addendum)
As we discussed you have a DVT in your right lower leg, this is a blood clot that formed one of the veins.  The medication that we are starting you on at this time should help to prevent worsening of the blood clot, and any movement of that blood clot to your lungs as we discussed.  If you have new chest pain, shortness of breath, feeling of heart racing, coughing up blood before you are able to see your PCP you should return to the emergency department immediately.  Otherwise please take the Eliquis which is the new blood thinner medication as we discussed, 10 mg twice daily for the first week, 5 mg daily thereafter, and contact your primary care doctor for follow-up appointment in her office within the next 2 days.  Recommend keeping leg elevated, Tylenol for pain control.

## 2021-10-16 NOTE — ED Triage Notes (Signed)
Pt came in POV c/o with some throbbing in her Right leg. Pt stated this started around 3 days ago. Pt saw her PCP and recommended her to be evaluated for a DVT.

## 2021-10-16 NOTE — ED Notes (Signed)
RN contacted patient's son, Shanon Brow, with her permission to update him on medication and plan of care. Shanon Brow denies any questions at this time.

## 2021-10-16 NOTE — ED Provider Notes (Signed)
[name]   CSN: [id number] Arrival date & time: 10/16/21  1050     History  No chief complaint on file.   Michele Monroe is a 76 y.o. female with past medical history significant for hypertension, hyperlipidemia, GERD, IBS, who has been receiving steroid injections secondary to right lower extremity pain for the last few weeks.  Patient reports that she went back to her PCP for repeat steroid injection of the right leg, however right calf swelling for around the last 3 days with some pain extending from the right ankle up to the right mid calf.  Patient does also endorse some pain in the right lower extremity.  Patient denies any recent travel, hormone use, history of cancer, history of blood clots, hemoptysis, chest pain, shortness of breath.  Patient reports that she has had some ongoing GI issues for which she is receiving a scope but otherwise does not have any acute complaints today, just wants a DVT evaluation.  HPI     Home Medications Prior to Admission medications   Medication Sig Start Date End Date Taking? Authorizing Provider  apixaban (ELIQUIS) 5 MG TABS tablet Take 2 tablets (15m) twice daily for 7 days, then 1 tablet (591m twice daily 10/16/21  Yes [name] H, PA-C  albuterol (VENTOLIN HFA) 108 (90 Base) MCG/ACT inhaler Inhale 2 puffs by mouth every 6 hours as needed. LAST refill pt needs appt. 04/07/21   Panosh, [name]  Ascorbic Acid (VITAMIN C) 1000 MG tablet Take 1,000 mg by mouth daily.    [provider]  atenolol (TENORMIN) 25 MG tablet Take 1 tablet (25 mg total) by mouth daily. 09/16/21   Panosh, [name]  atorvastatin (LIPITOR) 20 MG tablet Take 1 tablet by mouth every day 09/23/21   Panosh, [name]  carbamide peroxide (DEBROX) 6.5 % OTIC solution Place 5 drops into both ears 2 (two) times daily. Patient not taking: Reported on 10/15/2021 10/16/20   Just, [name]  cholecalciferol (VITAMIN  D3) 25 MCG (1000 UNIT) tablet Take 1,000 Units by mouth daily.    [provider]  ondansetron (ZOFRAN-ODT) 4 MG disintegrating tablet Take 1 tablet (4 mg total) by mouth every 8 (eight) hours as needed for nausea or vomiting. 10/14/21   Panosh, [name]  pantoprazole (PROTONIX) 40 MG tablet Take 1 tablet (40 mg total) by mouth daily. 10/15/21   [name]  polyethylene glycol (MIRALAX / GLYCOLAX) 17 g packet Take 17 g by mouth daily. 10/15/21   [name]  Probiotic Product (PROBIOTIC ACIDOPHILUS BEADS PO) Take by mouth.    [provider]  rizatriptan (MAXALT) 10 MG tablet Take 1 tablet at the onset of migraine, may repeat in 2 hours. Max of 2 doses per day 07/09/21   [name]  tiZANidine (ZANAFLEX) 4 MG tablet Take one-half to one tablet by mouth at bedtime as needed for neck pain 09/16/21   Panosh, [name]  valACYclovir (VALTREX) 1000 MG tablet Take 2 tablets (2,000 mg total) by mouth 2 (two) times daily. 10/12/21   Panosh, [name]  vitamin B-12 (CYANOCOBALAMIN) 500 MCG tablet Take 500 mcg by mouth daily.    [provider]  Vitamins-Lipotropics (B-100 CR PO) Take by mouth.    [provider]  Wheat Dextrin ([facility] LPPOWD Use as directed, daily 10/15/21   [name]  Zinc 30 MG CAPS  Take by mouth.    [provider]      Allergies    Patient has no known allergies.    Review of Systems   Review of Systems  Respiratory:  Negative for chest tightness and shortness of breath.   Cardiovascular:  Positive for leg swelling. Negative for chest pain.  All other systems reviewed and are negative.  Physical Exam Updated Vital Signs BP 127/72    Pulse 71    Temp 98.2 F (36.8 C)    Resp 12    Ht 5' 5"  (1.651 m)    Wt 80.3 kg    SpO2 100%    BMI 29.45 kg/m  Physical Exam Vitals and nursing note reviewed.  Constitutional:      General: She is not in acute distress.    Appearance: Normal appearance.   HENT:     Head: Normocephalic and atraumatic.  Eyes:     General:        Right eye: No discharge.        Left eye: No discharge.  Cardiovascular:     Rate and Rhythm: Normal rate and regular rhythm.     Pulses: Normal pulses.     Heart sounds: No murmur heard.   No friction rub. No gallop.     Comments: Intact DP, PT pulses bilaterally Pulmonary:     Effort: Pulmonary effort is normal.     Breath sounds: Normal breath sounds.  Abdominal:     General: Bowel sounds are normal.     Palpations: Abdomen is soft.  Musculoskeletal:     Comments: Right lower extremity greater diameter than the left lower extremity, without significant redness, tenderness.  There are some prominent varicose veins noted in bilateral lower extremities.  Skin:    General: Skin is warm and dry.     Capillary Refill: Capillary refill takes less than 2 seconds.  Neurological:     Mental Status: She is alert and oriented to person, place, and time.  Psychiatric:        Mood and Affect: Mood normal.        Behavior: Behavior normal.    ED Results / Procedures / Treatments   Labs (all labs ordered are listed, but only abnormal results are displayed) Labs Reviewed  CBC WITH DIFFERENTIAL/PLATELET - Abnormal; Notable for the following components:      Result Value   Neutro Abs 8.2 (*)    All other components within normal limits  BASIC METABOLIC PANEL    EKG None  Radiology US Venous Img Lower Right (DVT Study)  Result Date: 10/16/2021 CLINICAL DATA:  Right lower extremity swelling for 3 days EXAM: RIGHT LOWER EXTREMITY VENOUS DOPPLER ULTRASOUND TECHNIQUE: Gray-scale sonography with graded compression, as well as color Doppler and duplex ultrasound were performed to evaluate the lower extremity deep venous systems from the level of the common femoral vein and including the common femoral, femoral, profunda femoral, popliteal and calf veins including the posterior tibial, peroneal and gastrocnemius veins  when visible. The superficial great saphenous vein was also interrogated. Spectral Doppler was utilized to evaluate flow at rest and with distal augmentation maneuvers in the common femoral, femoral and popliteal veins. COMPARISON:  None. FINDINGS: Contralateral Common Femoral Vein: Respiratory phasicity is normal and symmetric with the symptomatic side. No evidence of thrombus. Normal compressibility. Common Femoral Vein: No evidence of thrombus. Normal compressibility, respiratory phasicity and response to augmentation. Saphenofemoral Junction: No evidence of thrombus. Normal compressibility and flow on  color Doppler imaging. Profunda Femoral Vein: No evidence of thrombus. Normal compressibility and flow on color Doppler imaging. Femoral Vein: Mixed echogenicity intraluminal thrombus. Vessel is noncompressible. Thrombus appears occlusive throughout the femoral vein. Popliteal Vein: Mixed echogenicity intraluminal thrombus appearing occlusive. Vessel noncompressible. Calf Veins: Thrombus does appear to extend into the tibial and peroneal veins which are also noncompressible. IMPRESSION: Positive exam for right lower extremity occlusive appearing femoropopliteal DVT extending into the calf veins. Electronically Signed   By: Jerilynn Mages.  Shick M.D.   On: 10/16/2021 12:58    Procedures Procedures    Medications Ordered in ED Medications  apixaban (ELIQUIS) tablet 10 mg (has no administration in time range)  apixaban (ELIQUIS) Education Kit for DVT/PE patients (has no administration in time range)    ED Course/ Medical Decision Making/ A&P Clinical Course as of 10/16/21 1409  Fri Oct 16, 2021  1313 Na 137, K 3.8, Cl 101, CO2 29, Glucose 102, BUN, 12, Creatinine 0.88, Tbili 0.7, Alk Phos 63, AST 20, ALT 21, Total Protein 7.3, Albumin 3.9, Calcium 9.8  WBC 11.9, RBC, 4.47, Hemoglobin 12.5, HCT 37.2, MCV 83.3, MCHC 33.5, RDW 13.1, Platelets 201.0, Neutrophils 67.4, Lymphoctes 20.2, Monocyte 9.1, Eosinophil 2.7,  Basophils 0.6, Neutro 8.0, Lymphs Abs 2.4, Monocyte 1.1, Eosinophils 0.3, Basophils 0.1 [CP]  1341 Spoke with Dr. Stanford Breed who agrees to outpatient follow up after loading dose here [CP]    Clinical Course User Index [CP] Anselmo Pickler, PA-C                           Medical Decision Making Amount and/or Complexity of Data Reviewed Labs: ordered. ECG/medicine tests: ordered.   This patient presents to the ED for concern of unilateral right leg swelling without chest pain, shortness of breath, hemoptysis, this involves an extensive number of treatment options, and is a complaint that carries with it a high risk of complications and morbidity. The emergent differential diagnosis includes, but is not limited to, DVT, septic arthritis, plus or minus acute PE, heart failure, ischemic leg, cellulitis, versus other.   Co morbidities that complicate the patient evaluation: Hypertension, hyperlipidemia External records from outside source obtained and reviewed including previous doctor visits, lab values from yesterday which can be noted in the ED course.  Physical Exam: Physical exam performed. The pertinent findings include: Normal heart lung sounds, no acute distress, normal vital signs, right greater than left calf swelling without significant redness.  Intact range of motion, intact pulses bilaterally.  Evidence of overlying skin changes, cellulitis.  Lab Tests: I Ordered, and personally interpreted labs.  The pertinent results include: Patient with normal platelets, 175, normal white blood cell count today compared to yesterday 10.3.  Her kidney function from yesterday was normal with creatinine of 0.88.   Imaging Studies: I ordered imaging studies including ultrasound venous imaging of the lower right extremity. I independently visualized and interpreted imaging which showed acute DVT of the right deep femoral vein extending into the calf veins. I agree with the radiologist  interpretation.   Cardiac Monitoring:  The patient was maintained on a cardiac monitor.  My attending physician Dr. Sherry Ruffing viewed and interpreted the cardiac monitored which showed an underlying rhythm of: Sinus rhythm with left bundle branch block.  No evidence of ischemia, right heart strain noted.   Medications: I ordered medication including Eliquis for DVT initiation. Consultations Obtained:  I requested consultation with the vascular surgeon, Dr. Stanford Breed,  and discussed lab  and imaging findings as well as pertinent plan - they recommend: No inpatient hospitalization without other symptoms, chest pain, shortness of breath, patient okay and appropriate to start on Eliquis, with close follow-up with PCP.  I agree with these recommendations.  Discussed with patient who feels comfortable with this plan, will follow-up with her PCP at her earliest convenience.  Very strict return precautions given, patient discharged in stable condition at this time.  She is ambulatory, vital signs stable at time of discharge.    Final Clinical Impression(s) / ED Diagnoses Final diagnoses:  DVT of deep femoral vein, right [facility])    Rx / DC Orders ED Discharge Orders          Ordered    apixaban (ELIQUIS) 5 MG TABS tablet        10/16/21 1358              Fallan Mccarey, [place] H, PA-C 10/16/21 1409    Tegeler, Gwenyth Allegra, MD 10/16/21 [phone]

## 2021-10-16 NOTE — Telephone Encounter (Signed)
Patient called because she went to Dr.Bokshan's office because she was having a problem with her right shoulder and knee. She was sent to the ED by Dr.Bokshan because her knee was swollen, red and hard. It was determined that she had a blood clot in her leg and she was given some ELIQUIS to take. Patient is requesting that Dr.Panosh write her a prescription as she was not given a prescription by ED and she will run out. Patient stated that Dr.Bokshan's office mentioned something about a follow up in a couple days, but she did not know what that was about. I let her know that she would need to contact his office to see if a follow up was needed with him, but I could schedule a follow up with Dr.Panosh. Patient declined appointment with Dr.Panosh when asked and stated she would call back on Monday.        Please advise

## 2021-10-16 NOTE — Telephone Encounter (Signed)
Patient called asked if she is suppose to followup with Dr Sammuel Hines after finding out she does have a DVT. The number to contact patient is [phone]

## 2021-10-16 NOTE — Progress Notes (Signed)
Chief Complaint: right knee pain     History of Present Illness:   10/16/2021: Soren presents today with ongoing right knee pain and right shoulder pain.  Specifically, her right knee injection gave her significant pain relief although this pain is subsequently resolved.  She reports swelling and pain down the entire aspect of the leg at today's visit.  She is concerned about redness and swelling.  Regard to the lateral right shoulder she has had pain with this for the last 10 years.  She was previously diagnosed with arthritis.  Complains predominantly of overhead activity as well as limited range of motion at above 90 degrees of active abduction.  She has had physical therapy in the past with limited relief.  KAETLYN Monroe is a 76 y.o. female presents with right knee pain now since her birthday in October of this year.  She states that the pain is somewhat diffuse.  She describes the knee is swollen and hard to walk on.  She has had to purchase a cane given the knee pain.  She has been using over-the-counter gels and frankincense on the knee which helps somewhat.  She has been taking some medications which are not helpful.  She was given a Medrol steroid by the ED which did not help significantly.  She is here today requesting an injection of the right knee.  She does have an appointment with the rheumatologist in the upcoming several months    Surgical History:   None  PMH/PSH/Family History/Social History/Meds/Allergies:    Past Medical History:  Diagnosis Date   Adenomatous colon polyp    ALLERGIC RHINITIS 06/30/2010   perennial   Allergy    seasonal allergies   Anxiety    hx of   Arthritis    bilateral hands (R>L)   Coughing up blood 08/13/2012   piece of tissue? will send to path poss from upper airway sinusitis  Final path was "abscess"    Depression    hx of   GERD 06/30/2010   on meds   Headache(784.0) 06/30/2010   migraines    Hepatic steatosis    HYPERLIPIDEMIA 06/30/2010   on meds   HYPERTENSION 06/30/2010   on meds   IBS 06/30/2010   Internal hemorrhoids    Renal lesion    Varicose veins    VITAMIN D DEFICIENCY 06/30/2010   Past Surgical History:  Procedure Laterality Date   BREAST CYST ASPIRATION     BREAST SURGERY     needle bx left breast   COLONOSCOPY  2016   KN-prep good-TA   feet     hammer toe sx   POLYPECTOMY  2016   TA   ROTATOR CUFF REPAIR Right    varicose veins     Social History   Socioeconomic History   Marital status: Widowed    Spouse name: Not on file   Number of children: 2   Years of education: Not on file   Highest education level: Not on file  Occupational History   Occupation: retired    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       Lives in [place]      3 cups of caffinated coffee per day. Denies soda/tea   Right handed   Social Determinants of Health   Financial Resource Strain: Low Risk    Difficulty of Paying Living Expenses: Not hard at all  Food Insecurity: No Food Insecurity   Worried About Charity fundraiser in the Last Year: Never true   Ran Out of Food in the Last Year: Never true  Transportation Needs: No Transportation Needs   Lack of Transportation (Medical): No   Lack of Transportation (Non-Medical): No  Physical Activity:  Insufficiently Active   Days of Exercise per Week: 5 days   Minutes of Exercise per Session: 10 min  Stress: No Stress Concern Present   Feeling of Stress : Not at all  Social Connections: Moderately Isolated   Frequency of Communication with Friends and Family: More than three times a week   Frequency of Social Gatherings with Friends and Family: More than three times a week   Attends Religious Services: 1 to 4 times per year   Active Member of [employer] or Organizations: No   Attends Archivist Meetings: Never   Marital Status: Widowed   Family History  Problem Relation Age of Onset   Colon cancer Mother        age 17   Colon polyps Mother    Breast cancer Cousin    Arthritis Brother    Other Brother    Colon polyps Brother    Esophageal cancer Neg Hx    Stomach cancer Neg Hx    Rectal cancer Neg Hx    No Known Allergies Current Facility-Administered Medications  Medication Dose Route Frequency Provider Last Rate Last Admin   0.9 %  sodium chloride infusion  500 mL Intravenous Once Nandigam, Venia Minks, MD       Current Outpatient Medications  Medication Sig Dispense Refill   albuterol (VENTOLIN HFA) 108 (90 Base) MCG/ACT inhaler Inhale 2 puffs by mouth every 6 hours as needed. LAST refill pt needs appt. 8.5 each 0   Ascorbic Acid (VITAMIN C) 1000 MG tablet Take 1,000 mg by mouth daily.     atenolol (TENORMIN) 25 MG tablet Take 1 tablet (25 mg total) by mouth daily. 90 tablet 1   atorvastatin (LIPITOR) 20 MG tablet Take 1 tablet by mouth every day 30 tablet 11   carbamide peroxide (DEBROX) 6.5 % OTIC solution Place 5 drops into both ears 2 (two) times daily. (Patient not taking: Reported on 10/15/2021) 15 mL 0   cholecalciferol (VITAMIN D3) 25 MCG (1000 UNIT) tablet Take 1,000 Units by mouth daily.     ondansetron (ZOFRAN-ODT) 4 MG disintegrating tablet Take 1 tablet (4 mg total) by mouth every 8 (eight) hours as needed for nausea or vomiting. 30  tablet 0   pantoprazole (PROTONIX) 40 MG tablet Take 1 tablet (40  mg total) by mouth daily. 30 tablet 3   polyethylene glycol (MIRALAX / GLYCOLAX) 17 g packet Take 17 g by mouth daily. 30 packet 3   Probiotic Product (PROBIOTIC ACIDOPHILUS BEADS PO) Take by mouth.     rizatriptan (MAXALT) 10 MG tablet Take 1 tablet at the onset of migraine, may repeat in 2 hours. Max of 2 doses per day 11 tablet 0   tiZANidine (ZANAFLEX) 4 MG tablet Take one-half to one tablet by mouth at bedtime as needed for neck pain 30 tablet 11   valACYclovir (VALTREX) 1000 MG tablet Take 2 tablets (2,000 mg total) by mouth 2 (two) times daily. 30 tablet 1   vitamin B-12 (CYANOCOBALAMIN) 500 MCG tablet Take 500 mcg by mouth daily.     Vitamins-Lipotropics (B-100 CR PO) Take by mouth.     Wheat Dextrin (BENEFIBER) POWD Use as directed, daily  0   Zinc 30 MG CAPS Take by mouth.     No results found.  Review of Systems:   A ROS was performed including pertinent positives and negatives as documented in the HPI.  Physical Exam :   Constitutional: NAD and appears stated age Neurological: Alert and oriented Psych: Appropriate affect and cooperative There were no vitals taken for this visit.   Comprehensive Musculoskeletal Exam:      With active range of motion to 90 degrees abduction and forward flexion.  External rotation at the side is to 50 degrees.  Internal rotation is to back pocket with significant pain.  Experiencing pain predominantly over the lateral aspect of the shoulder.  With regard to the right knee she has significant redness and swelling with nearly twice the size of the contralateral lower leg.  2+ dorsalis pedis pulse.  Positive Homans test.   Imaging:   Xray (3 views right knee): Mild tricompartmental osteoarthritis  Right shoulder x-ray 3 views: Normal  I personally reviewed and interpreted the radiographs.   Assessment:   76 year old female with right mild tricompartmental  osteoarthritis with pain since October of this year.  She does have swelling and pain associated with today's visit in the right lower leg.  I discussed that at this point I would like to ultimately rule out a DVT on the right side.  I would plan to forego any additional injections in this right knee for the time being while we rule out.  Regard to the right shoulder she is having symptoms consistent with rotator cuff tendinitis and possible tearing given her limited motion.  As result I would like to obtain an MRI of this right shoulder.  I would like to perform a right ultrasound-guided injection today to give her some pain relief.  She was specifically extracted on a home exercise program and band therapy for the right shoulder which she will work on.  Plan :    -Return following DVT ultrasound discuss results -Right ultrasound-guided subacromial injection performed today after verbal consent obtained    Procedure Note  Patient: Michele Monroe             Date of Birth: 1946-08-26           MRN: [id number]             Visit Date: 10/16/2021  Procedures: Visit Diagnoses:  1. Unilateral primary osteoarthritis, right knee   2. Chronic right shoulder pain     Large Joint Inj on 10/16/2021 11:48 AM Indications: pain Details: 22 G 1.5 in needle, ultrasound-guided anterior approach  Arthrogram: No  Medications: 4 mL lidocaine 1 %; 80 mg triamcinolone acetonide 40 MG/ML Outcome: tolerated well, no immediate complications Procedure, treatment alternatives, risks and benefits explained, specific risks discussed. Consent was given by the patient. Immediately prior to procedure a time out was called to verify the correct patient, procedure, equipment, support staff and site/side marked as required. Patient was prepped and draped in the usual sterile fashion.         I personally saw and evaluated the patient, and participated in the management and treatment plan.  Vanetta Mulders,  MD Attending Physician, Orthopedic Surgery  This document was dictated using Dragon voice recognition software. A reasonable attempt at proof reading has been made to minimize errors.

## 2021-10-19 ENCOUNTER — Encounter: Payer: Self-pay | Admitting: Internal Medicine

## 2021-10-19 ENCOUNTER — Encounter: Payer: Self-pay | Admitting: Gastroenterology

## 2021-10-19 NOTE — Progress Notes (Signed)
Stool  positive for blood  before placed on the blood thinner   send info to GI  team  I saw that  you developed a blood clot in the leg  and were placed on  blood thinner eliquis.  Needs fu appt  appt  in  person preferred with [place]   if no one else is managing the blood clot . She should have enough medication for a few weeks and then we can take over  the scrip.

## 2021-10-19 NOTE — Telephone Encounter (Signed)
Pt called again concerning message below. She wanted to know if she needs to FU with [facility] regarding her DVT. She also stated that her leg Is still swollen and does she need to keep it elevated??

## 2021-10-19 NOTE — Telephone Encounter (Signed)
Spoke to patient about DVT. Informed her she does not need to f/u with [facility] for that specfically, but we will continue treating her for her shoulder. Her f/u shoulder appt is in 4 weeks. Informed her to contact her PCP and continue DVT care with them

## 2021-10-20 NOTE — Telephone Encounter (Signed)
Not sure what to do with this message can discuss questions At her upcoming visit. Yes I would elevate her legs is much as possible.  When she is not having to do a task.

## 2021-10-21 ENCOUNTER — Encounter: Payer: Self-pay | Admitting: Gastroenterology

## 2021-10-21 ENCOUNTER — Encounter: Payer: Medicare Other | Admitting: Gastroenterology

## 2021-10-21 NOTE — Telephone Encounter (Signed)
Please ask the GI team department what they advise  .

## 2021-10-22 ENCOUNTER — Telehealth: Payer: Self-pay

## 2021-10-22 ENCOUNTER — Other Ambulatory Visit: Payer: Self-pay

## 2021-10-22 ENCOUNTER — Other Ambulatory Visit: Payer: Self-pay | Admitting: Physician Assistant

## 2021-10-22 DIAGNOSIS — K5900 Constipation, unspecified: Secondary | ICD-10-CM

## 2021-10-22 DIAGNOSIS — R112 Nausea with vomiting, unspecified: Secondary | ICD-10-CM

## 2021-10-22 DIAGNOSIS — K219 Gastro-esophageal reflux disease without esophagitis: Secondary | ICD-10-CM

## 2021-10-22 MED ORDER — POLYETHYLENE GLYCOL 3350 17 G PO PACK: 17.0000 g | PACK | Freq: Two times a day (BID) | ORAL | 3 refills | Status: DC

## 2021-10-22 MED ORDER — BENEFIBER PO POWD: ORAL | 0 refills | Status: DC

## 2021-10-22 MED ORDER — SENNOSIDES-DOCUSATE SODIUM 8.6-50 MG PO TABS: 1.0000 | ORAL_TABLET | Freq: Every evening | ORAL | Status: DC | PRN

## 2021-10-22 NOTE — Telephone Encounter (Signed)
MAR updated to reflect changes to OTC medications as recommended by Vicie Mutters, PA-C. Appears Vicie Mutters, PA-C also informed pt to proceed to Radiology for completion of Xray. Additional My Chart message sent to provide pt with additional clarity re: location of Radiology Dept.

## 2021-10-22 NOTE — Telephone Encounter (Signed)
HOLD ELIQUIS OR BRIDGE?    Michele Monroe November 20, 1945 [id number]  Procedure: EGD Anesthesia type:  MAC Procedure Date: 11/24/21 Provider: Dr. Silverio Decamp  Type of Clearance needed: Pharmacy  Medication(s) needing held: Eliquis hold or bridge?   Length of time for medication to be held: 1-2 days  Please review request and advise by either responding to this message or by sending your response to the fax # provided below.  Thank you,  [facility]  Phone: [phone] Fax: [phone] ATTENTION: Ashley Bultema, LPN

## 2021-10-22 NOTE — Telephone Encounter (Signed)
Received the following response from Dr. Silverio Decamp today:  Given recent diagnosis of DVT, patient will likely need to stay on Eliquis uninterrupted for at least 3 to 6 months.  We will need to defer EGD to a later date.  Please cancel the procedure for March 27.  We will need to reschedule once she is cleared to hold anticoagulation  Please obtain barium upper GI series to exclude any high risk lesions  Thank you

## 2021-10-22 NOTE — Telephone Encounter (Signed)
-----   Message from Mauri Pole, MD sent at 10/22/2021  3:35 PM EST ----- Given recent diagnosis of DVT, patient will likely need to stay on Eliquis uninterrupted for at least 3 to 6 months.  We will need to defer EGD to a later date.  Please cancel the procedure for March 27. We will need to reschedule once she is cleared to hold anticoagulation Please obtain barium upper GI series to exclude any high risk lesions Thank you ----- Message ----- From: Greggory Keen, LPN Sent: 9/52/8413   5:00 PM EST To: Mauri Pole, MD  Michele Monroe saw Michele Monroe.  Since then the patient has had a DVT and started on Eliquis. Michele Monroe, the nurse of Michele Monroe, has routed a lab result to you about it.  The patient is scheduled for an EGD on 11/30/21.

## 2021-10-22 NOTE — Telephone Encounter (Signed)
EGD canceled per Dr. Woodward Ku request. UGI series also ordered. Message sent to Radiology Central Scheduling for scheduling purposes. Called pt and informed about need and rationale for canceling EGD. Also advised about new orders and scheduling process. Provided pt with contact # [phone] to self schedule if no call received within next 7-10 business days. Verbalized acceptance and understanding.

## 2021-10-27 ENCOUNTER — Ambulatory Visit (INDEPENDENT_AMBULATORY_CARE_PROVIDER_SITE_OTHER): Payer: Medicare Other | Admitting: Internal Medicine

## 2021-10-27 ENCOUNTER — Encounter: Payer: Self-pay | Admitting: Internal Medicine

## 2021-10-27 ENCOUNTER — Ambulatory Visit (INDEPENDENT_AMBULATORY_CARE_PROVIDER_SITE_OTHER): Payer: Medicare Other

## 2021-10-27 VITALS — BP 112/64 | HR 72 | Temp 98.0°F | Ht 65.5 in | Wt 175.0 lb

## 2021-10-27 VITALS — BP 112/64 | HR 72 | Temp 98.0°F | Ht 65.5 in | Wt 175.5 lb

## 2021-10-27 DIAGNOSIS — R195 Other fecal abnormalities: Secondary | ICD-10-CM

## 2021-10-27 DIAGNOSIS — K5900 Constipation, unspecified: Secondary | ICD-10-CM | POA: Diagnosis not present

## 2021-10-27 DIAGNOSIS — I824Y1 Acute embolism and thrombosis of unspecified deep veins of right proximal lower extremity: Secondary | ICD-10-CM

## 2021-10-27 DIAGNOSIS — Z79899 Other long term (current) drug therapy: Secondary | ICD-10-CM | POA: Diagnosis not present

## 2021-10-27 DIAGNOSIS — Z7901 Long term (current) use of anticoagulants: Secondary | ICD-10-CM

## 2021-10-27 DIAGNOSIS — Z0000 Encounter for general adult medical examination without abnormal findings: Secondary | ICD-10-CM | POA: Diagnosis not present

## 2021-10-27 DIAGNOSIS — I83893 Varicose veins of bilateral lower extremities with other complications: Secondary | ICD-10-CM | POA: Diagnosis not present

## 2021-10-27 MED ORDER — APIXABAN 5 MG PO TABS: 5.0000 mg | ORAL_TABLET | Freq: Two times a day (BID) | ORAL | 3 refills | Status: DC

## 2021-10-27 MED ORDER — VALACYCLOVIR HCL 1 G PO TABS: 2000.0000 mg | ORAL_TABLET | Freq: Two times a day (BID) | ORAL | 1 refills | Status: DC

## 2021-10-27 NOTE — Patient Instructions (Signed)
Stay on eliquis  blood thinner  3-6 months depending.  Will refill   med   Increase miralax to 2  package per day .   For the constipation and then if no help  contact the GI team.   Will refill the valtrex again . To your pharmacy .

## 2021-10-27 NOTE — Progress Notes (Signed)
This visit occurred during the SARS-CoV-2 public health emergency.  Safety protocols were in place, including screening questions prior to the visit, additional usage of staff PPE, and extensive cleaning of exam room while observing appropriate contact time as indicated for disinfecting solutions.  Subjective:   Michele Monroe is a 76 y.o. female who presents for Medicare Annual (Subsequent) preventive examination.  Review of Systems     Cardiac Risk Factors include: advanced age (>55men, >36 women);dyslipidemia;hypertension     Objective:    Today's Vitals   10/27/21 0900  BP: 112/64  Pulse: 72  Temp: 98 F (36.7 C)  TempSrc: Oral  SpO2: 96%  Weight: 175 lb 8 oz (79.6 kg)  Height: 5' 5.5" (1.664 m)   Body mass index is 28.76 kg/m.  Advanced Directives 10/27/2021 10/16/2021 08/06/2021 10/21/2020 07/21/2020 02/28/2020 06/13/2017  Does Patient Have a Medical Advance Directive? No No No No No No No  Would patient like information on creating a medical advance directive? - No - Patient declined - Yes (MAU/Ambulatory/Procedural Areas - Information given) - - -    Current Medications (verified) Outpatient Encounter Medications as of 10/27/2021  Medication Sig   albuterol (VENTOLIN HFA) 108 (90 Base) MCG/ACT inhaler Inhale 2 puffs by mouth every 6 hours as needed. LAST refill pt needs appt.   apixaban (ELIQUIS) 5 MG TABS tablet Take 2 tablets (10mg ) twice daily for 7 days, then 1 tablet (5mg ) twice daily   Ascorbic Acid (VITAMIN C) 1000 MG tablet Take 1,000 mg by mouth daily.   atenolol (TENORMIN) 25 MG tablet Take 1 tablet (25 mg total) by mouth daily.   atorvastatin (LIPITOR) 20 MG tablet Take 1 tablet by mouth every day   carbamide peroxide (DEBROX) 6.5 % OTIC solution Place 5 drops into both ears 2 (two) times daily.   cholecalciferol (VITAMIN D3) 25 MCG (1000 UNIT) tablet Take 1,000 Units by mouth daily.   ondansetron (ZOFRAN-ODT) 4 MG disintegrating tablet Take 1 tablet (4 mg total)  by mouth every 8 (eight) hours as needed for nausea or vomiting.   pantoprazole (PROTONIX) 40 MG tablet Take 1 tablet (40 mg total) by mouth daily.   polyethylene glycol (MIRALAX / GLYCOLAX) 17 g packet Take 17 g by mouth 2 (two) times daily.   Probiotic Product (PROBIOTIC ACIDOPHILUS BEADS PO) Take by mouth.   rizatriptan (MAXALT) 10 MG tablet Take 1 tablet at the onset of migraine, may repeat in 2 hours. Max of 2 doses per day   senna-docusate (SENOKOT S) 8.6-50 MG tablet Take 1 tablet by mouth at bedtime as needed for mild constipation.   tiZANidine (ZANAFLEX) 4 MG tablet Take one-half to one tablet by mouth at bedtime as needed for neck pain   valACYclovir (VALTREX) 1000 MG tablet Take 2 tablets (2,000 mg total) by mouth 2 (two) times daily.   vitamin B-12 (CYANOCOBALAMIN) 500 MCG tablet Take 500 mcg by mouth daily.   Wheat Dextrin (BENEFIBER) POWD Use as directed BID   Vitamins-Lipotropics (B-100 CR PO) Take by mouth. (Patient not taking: Reported on 10/27/2021)   Zinc 30 MG CAPS Take by mouth. (Patient not taking: Reported on 10/27/2021)   Facility-Administered Encounter Medications as of 10/27/2021  Medication   0.9 %  sodium chloride infusion    Allergies (verified) Patient has no known allergies.   History: Past Medical History:  Diagnosis Date   Adenomatous colon polyp    ALLERGIC RHINITIS 06/30/2010   perennial   Allergy    seasonal allergies  Anxiety    hx of   Arthritis    bilateral hands (R>L)   Coughing up blood 08/13/2012   piece of tissue? will send to path poss from upper airway sinusitis  Final path was "abscess"    Depression    hx of   GERD 06/30/2010   on meds   Headache(784.0) 06/30/2010   migraines   Hepatic steatosis    HYPERLIPIDEMIA 06/30/2010   on meds   HYPERTENSION 06/30/2010   on meds   IBS 06/30/2010   Internal hemorrhoids    Renal lesion    Varicose veins    VITAMIN D DEFICIENCY 06/30/2010   Past Surgical History:  Procedure  Laterality Date   BREAST CYST ASPIRATION     BREAST SURGERY     needle bx left breast   COLONOSCOPY  2016   KN-prep good-TA   feet     hammer toe sx   POLYPECTOMY  2016   TA   ROTATOR CUFF REPAIR Right    varicose veins     Family History  Problem Relation Age of Onset   Colon cancer Mother        age 44   Colon polyps Mother    Breast cancer Cousin    Arthritis Brother    Other Brother    Colon polyps Brother    Esophageal cancer Neg Hx    Stomach cancer Neg Hx    Rectal cancer Neg Hx    Social History   Socioeconomic History   Marital status: Widowed    Spouse name: Not on file   Number of children: 2   Years of education: Not on file   Highest education level: Not on file  Occupational History   Occupation: retired    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       Lives in [place]      3 cups of caffinated coffee per day. Denies soda/tea   Right handed   Social Determinants of Health   Financial Resource Strain: Low Risk    Difficulty of Paying Living Expenses: Not hard at all  Food Insecurity: No Food Insecurity   Worried About Charity fundraiser in the Last Year: Never true   Ran Out of Food in the Last Year: Never true  Transportation Needs: No Transportation Needs   Lack of Transportation (Medical): No   Lack of Transportation (Non-Medical): No  Physical Activity: Inactive    Days of Exercise per Week: 0 days   Minutes of Exercise per Session: 0 min  Stress: Stress Concern Present   Feeling of Stress : To some extent  Social Connections: Not on file    Tobacco Counseling Counseling given: Not  Answered   Clinical Intake:  Pre-visit preparation completed: Yes  Pain : No/denies pain     Nutritional Status: BMI 25 -29 Overweight Nutritional Risks: None Diabetes: No  How often do you need to have someone help you when you read instructions, pamphlets, or other written materials from your doctor or pharmacy?: 1 - Never What is the last grade level you completed in school?: 12th grade  Diabetic? no  Interpreter Needed?: No  Information entered by :: NAllen LPN   Activities of Daily Living In your present state of health, do you have any difficulty performing the following activities: 10/27/2021  Hearing? N  Vision? N  Difficulty concentrating or making decisions? N  Walking or climbing stairs? N  Dressing or bathing? N  Doing errands, shopping? N  Preparing Food and eating ? N  Using the Toilet? N  In the past six months, have you accidently leaked urine? Y  Do you have problems with loss of bowel control? N  Managing your Medications? N  Managing your Finances? N  Housekeeping or managing your Housekeeping? N  Some recent data might be hidden    Patient Care Team: Panosh, [name], MD as PCP - Loman Brooklyn, Stephan Minister, DO as Consulting Physician (Neurology) Mauri Pole, MD as Consulting Physician (Gastroenterology)  Indicate any recent Medical Services you may have received from other than [facility] providers in the past year (date may be approximate).     Assessment:   This is a routine wellness examination for [place].  Hearing/Vision screen No results found.  Dietary issues and exercise activities discussed: Current Exercise Habits: The patient does not participate in regular exercise at present   Goals Addressed              This Visit's Progress    Patient Stated       10/27/2021, get blood clot gone       Depression Screen PHQ 2/9 Scores 10/27/2021 10/21/2020 10/16/2020 05/02/2019 08/08/2017 06/15/2016 03/06/2015  PHQ - 2 Score 0 0 0 0 0 0 1    Fall Risk Fall Risk  10/27/2021 10/27/2021 10/27/2021 10/21/2020 10/16/2020  Falls in the past year? 0 0 0 0 0  Number falls in past yr: - - - 0 0  Injury with Fall? - - - 0 0  Risk for fall due to : Medication side effect - - Impaired vision -  Follow up Falls evaluation completed;Education provided;Falls prevention discussed - - Falls prevention discussed Falls evaluation completed    FALL RISK PREVENTION PERTAINING TO THE HOME:  Any stairs in or around the home? No  If so, are there any without handrails?  N/a Home free of loose throw rugs in walkways, pet beds, electrical cords, etc? Yes  Adequate lighting in your home to reduce risk of falls? Yes   ASSISTIVE DEVICES UTILIZED TO PREVENT FALLS:  Life alert? No  Use of a cane, walker or w/c? No  Grab bars in the bathroom? No  Shower chair or bench in shower? No  Elevated toilet seat or a handicapped toilet? No   TIMED UP AND GO:  Was the test performed? No .    Gait steady and fast without use of assistive device  Cognitive Function:     6CIT Screen 10/27/2021 10/21/2020  What Year? 0 points -  What month? 0 points -  What time? 0 points -  Count back from 20 0 points 0 points  Months in reverse 0 points 0  points  Repeat phrase 4 points 4 points  Total Score 4 -    Immunizations Immunization History  Administered Date(s) Administered   Fluad Quad(high Dose 65+) 05/02/2019   Influenza Split 06/05/2012, 05/15/2013   Influenza, High Dose Seasonal PF 04/20/2016, 06/23/2017, 06/19/2018   Influenza,inj,Quad PF,6+ Mos 06/03/2015   Influenza-Unspecified 06/06/2014   Pneumococcal Conjugate-13 08/07/2012   Pneumococcal Polysaccharide-23 09/24/2012   Td 09/07/2007   Zoster, Live 09/24/2012     TDAP status: Due, Education has been provided regarding the importance of this vaccine. Advised may receive this vaccine at local pharmacy or Health Dept. Aware to provide a copy of the vaccination record if obtained from local pharmacy or Health Dept. Verbalized acceptance and understanding.  Flu Vaccine status: Declined, Education has been provided regarding the importance of this vaccine but patient still declined. Advised may receive this vaccine at local pharmacy or Health Dept. Aware to provide a copy of the vaccination record if obtained from local pharmacy or Health Dept. Verbalized acceptance and understanding.  Pneumococcal vaccine status: Up to date  Covid-19 vaccine status: Declined, Education has been provided regarding the importance of this vaccine but patient still declined. Advised may receive this vaccine at local pharmacy or Health Dept.or vaccine clinic. Aware to provide a copy of the vaccination record if obtained from local pharmacy or Health Dept. Verbalized acceptance and understanding.  Qualifies for Shingles Vaccine? Yes   Zostavax completed Yes   Shingrix Completed?: No.    Education has been provided regarding the importance of this vaccine. Patient has been advised to call insurance company to determine out of pocket expense if they have not yet received this vaccine. Advised may also receive vaccine at local pharmacy or Health Dept. Verbalized acceptance and understanding.  Screening Tests Health Maintenance  Topic Date Due   Zoster Vaccines- Shingrix (1 of 2) Never done   TETANUS/TDAP  09/06/2017   INFLUENZA VACCINE  04/06/2021   MAMMOGRAM  07/09/2023   COLONOSCOPY (Pts 45-67yrs Insurance coverage will need to be confirmed)  04/06/2024   Pneumonia Vaccine 34+ Years old  Completed   DEXA SCAN  Completed   Hepatitis C Screening  Completed   HPV VACCINES  Aged Out   COVID-19 Vaccine  Discontinued    Health Maintenance  Health Maintenance Due  Topic Date  Due   Zoster Vaccines- Shingrix (1 of 2) Never done   TETANUS/TDAP  09/06/2017   INFLUENZA VACCINE  04/06/2021    Colorectal cancer screening: Type of screening: Colonoscopy. Completed 04/06/2021. Repeat every 3 years  Mammogram status: Completed 07/08/2021. Repeat every year  Bone Density status: Completed 06/04/2019.   Lung Cancer Screening: (Low Dose CT Chest recommended if Age 64-80 years, 30 pack-year currently smoking OR have quit w/in 15years.) does not qualify.   Lung Cancer Screening Referral: no  Additional Screening:  Hepatitis C Screening: does qualify; Completed 08/08/2017  Vision Screening: Recommended annual ophthalmology exams for early detection of glaucoma and other disorders of the eye. Is the patient up to date with their annual eye exam?  Yes  Who is the provider or what is the name of the office in which the patient attends annual eye exams? [facility] If pt is not established with a provider, would they like to be referred to a provider to establish care? No .   Dental Screening: Recommended annual dental exams for proper oral hygiene  Community Resource Referral / Chronic Care Management: CRR required this visit?  No   CCM  required this visit?  No      Plan:     I have personally reviewed and noted the following in the patients chart:   Medical and social history Use of alcohol, tobacco or illicit drugs  Current medications and supplements including opioid prescriptions.  Functional ability and status Nutritional status Physical activity Advanced directives List of other physicians Hospitalizations, surgeries, and ER visits in previous 12 months Vitals Screenings to include cognitive, depression, and falls Referrals and appointments  In addition, I have reviewed and discussed with patient certain preventive protocols, quality metrics, and best practice recommendations. A written personalized care plan for preventive services as well as general  preventive health recommendations were provided to patient.     Kellie Simmering, LPN   1/61/0960   Nurse Notes: started eliquis for blood clot . Needs a refill.

## 2021-10-27 NOTE — Progress Notes (Signed)
Chief Complaint  Patient presents with   Follow-up    And medication refill    HPI: Michele Monroe 76 y.o. come in for a number of problems  Medication Valtrex was sent into pharmacy but pharmacy says they never received it is still getting mouth lip ulcers would like it resent. Right lower extremity DVT now on Eliquis on the 2 pills 5 mg a day she has been taking it 2 at the same time no side effects reported needs a refill right leg is much less swollen and does not hurt anymore.  Still is a bit larger than the left.  No active bleeding noted.  No falls. GI procedures were delayed she is now able to eat better is on Benefiber and 1 packet of MiraLAX a day lots of fluids "nothing is coming out" although was able to get some stool out the other day tends to be hard.  No blood noted.  Still states that she is still constipated and has not had this problem in her previous ly  Blood sugar was up in the ED but that was after they gave her sugar Coke and another drink has cut out sugar since that time. Is now on Protonix does not think it works as well as previous medicine for heartburn.  There is no vomiting she is eating light but much better.  ROS: See pertinent positives and negatives per HPI.  Past Medical History:  Diagnosis Date   Adenomatous colon polyp    ALLERGIC RHINITIS 06/30/2010   perennial   Allergy    seasonal allergies   Anxiety    hx of   Arthritis    bilateral hands (R>L)   Coughing up blood 08/13/2012   piece of tissue? will send to path poss from upper airway sinusitis  Final path was "abscess"    Depression    hx of   GERD 06/30/2010   on meds   Headache(784.0) 06/30/2010   migraines   Hepatic steatosis    HYPERLIPIDEMIA 06/30/2010   on meds   HYPERTENSION 06/30/2010   on meds   IBS 06/30/2010   Internal hemorrhoids    Renal lesion    Varicose veins    VITAMIN D DEFICIENCY 06/30/2010    Family History  Problem Relation Age of Onset   Colon cancer  Mother        age 20   Colon polyps Mother    Breast cancer Cousin    Arthritis Brother    Other Brother    Colon polyps Brother    Esophageal cancer Neg Hx    Stomach cancer Neg Hx    Rectal cancer Neg Hx     Social History   Socioeconomic History   Marital status: Widowed    Spouse name: Not on file   Number of children: 2   Years of education: Not on file   Highest education level: Not on file  Occupational History   Occupation: retired    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       Lives in [place]      3 cups of caffinated coffee per day. Denies soda/tea   Right handed   Social Determinants of Health   Financial Resource Strain: Low Risk    Difficulty of Paying Living Expenses: Not hard at all  Food Insecurity: No Food Insecurity   Worried About Charity fundraiser in the Last Year: Never true   Ran Out of Food in the Last Year: Never true  Transportation Needs: No Transportation Needs   Lack of Transportation (Medical): No   Lack of Transportation (Non-Medical): No  Physical Activity: Inactive   Days of Exercise per Week: 0 days   Minutes of Exercise per Session: 0 min  Stress: Stress Concern Present   Feeling of Stress : To some extent  Social Connections: Not on file    Outpatient Medications Prior to Visit  Medication Sig Dispense Refill   albuterol (VENTOLIN HFA) 108 (90  Base) MCG/ACT inhaler Inhale 2 puffs by mouth every 6 hours as needed. LAST refill pt needs appt. 8.5 each 0   Ascorbic Acid (VITAMIN C) 1000 MG tablet Take 1,000 mg by mouth daily.     atenolol (TENORMIN) 25 MG tablet Take 1 tablet (25 mg total) by mouth daily. 90 tablet 1   atorvastatin (LIPITOR) 20 MG tablet Take 1 tablet by mouth every day 30 tablet 11   carbamide peroxide (DEBROX) 6.5 % OTIC solution Place 5 drops into both ears 2 (two) times daily. 15 mL 0   cholecalciferol (VITAMIN D3) 25 MCG (1000 UNIT) tablet Take 1,000 Units by mouth daily.     ondansetron (ZOFRAN-ODT) 4 MG disintegrating tablet Take 1 tablet (4 mg total) by mouth every 8 (eight) hours as needed for nausea or vomiting. 30 tablet 0   pantoprazole (PROTONIX) 40 MG tablet Take 1 tablet (40 mg total) by mouth daily. 30 tablet 3   polyethylene glycol (MIRALAX / GLYCOLAX) 17 g packet Take 17 g by mouth 2 (two) times daily. 30 packet 3   Probiotic Product (PROBIOTIC ACIDOPHILUS BEADS PO) Take by mouth.     rizatriptan (MAXALT) 10 MG tablet Take 1 tablet at the onset of migraine, may repeat in 2 hours. Max of 2 doses per day 11 tablet 0   senna-docusate (SENOKOT S) 8.6-50 MG tablet Take 1 tablet by mouth at bedtime as needed for mild constipation.     tiZANidine (ZANAFLEX) 4 MG tablet Take one-half to one tablet by mouth at bedtime as needed for neck pain 30 tablet 11   vitamin B-12 (CYANOCOBALAMIN) 500 MCG tablet Take 500 mcg by mouth daily.     Vitamins-Lipotropics (B-100 CR PO) Take by mouth.     Wheat Dextrin (BENEFIBER) POWD Use as directed BID  0   Zinc 30 MG CAPS Take by mouth.     apixaban (ELIQUIS) 5 MG TABS tablet Take 2 tablets (10mg ) twice daily for 7 days, then 1 tablet (5mg ) twice daily 60 tablet 0   valACYclovir (VALTREX) 1000 MG tablet Take 2 tablets (2,000 mg total) by mouth 2 (two) times daily. 30 tablet 1   Facility-Administered Medications Prior to Visit  Medication Dose Route Frequency Provider Last Rate  Last Admin   0.9 %  sodium chloride infusion  500 mL Intravenous Once Nandigam, Venia Minks, MD         EXAM:  BP 112/64 (BP Location: Left Arm, Patient Position: Sitting, Cuff Size: Normal)    Pulse 72    Temp 98 F (36.7 C) (Oral)    Ht 5' 5.5" (1.664 m)    Wt 175 lb (79.4 kg)    SpO2 96%    BMI 28.68 kg/m   Body mass index is 28.68 kg/m.  GENERAL: vitals reviewed and listed above, alert, oriented, appears well hydrated and in no acute distress HEENT: atraumatic, conjunctiva  clear, no obvious abnormalities on inspection of external nose and ears OP : Masked NECK: no obvious masses on inspection palpation   CV: HRRR, no clubbing cyanosis or  peripheral edema nl cap refill  MS: moves all extremities bilateral varicose veins right more than left no redness phlebitis looking right calf larger than left but not tense ankles appear symmetrical pulses are present. Right shoulder has good range of motion slight bruise at area of injection per Ortho. Abdomen soft without again a megaly guarding or rebound obvious PSYCH: pleasant and cooperative, no obvious depression or anxiety Lab Results  Component Value Date   WBC 10.3 10/16/2021   HGB 12.4 10/16/2021   HCT 38.3 10/16/2021   PLT 175 10/16/2021   GLUCOSE 194 (H) 10/16/2021   CHOL 162 04/08/2021   TRIG 185.0 (H) 04/08/2021   HDL 49.90 04/08/2021   LDLCALC 75 04/08/2021   ALT 21 10/15/2021   AST 20 10/15/2021   NA 137 10/16/2021   K 3.9 10/16/2021   CL 103 10/16/2021   CREATININE 0.74 10/16/2021   BUN 7 (L) 10/16/2021   CO2 26 10/16/2021   TSH 1.85 04/08/2021   BP Readings from Last 3 Encounters:  10/27/21 112/64  10/27/21 112/64  10/16/21 (!) 116/55  IMPRESSION: 2/10 23 Positive exam for right lower extremity occlusive appearing femoropopliteal DVT extending into the calf veins.  ASSESSMENT AND PLAN:  Discussed the following assessment and plan:  Acute deep vein thrombosis (DVT) of proximal vein of right lower  extremity (HCC)  Constipation, unspecified constipation type  Medication management  Varicose veins of bilateral lower extremities with other complications  Anticoagulant long-term use  Positive fecal occult blood test - predated anticoagulation   no gors blood  Take the Eliquis twice daily continue plan 3 to 6 months, bleeding precautions Leg appears to be improved no underlying history family history or disease state. Has an upcoming appointment with rheumatology about the arthritic changes rule out autoimmune. Plan follow-up in about 2 months or as needed. We will share information with the GI team they may need to help her in follow-up if constipation GI symptoms are continuing to be problematic. -Patient advised to return or notify health care team  if  new concerns arise.  Patient Instructions  Stay on eliquis  blood thinner  3-6 months depending.  Will refill   med   Increase miralax to 2  package per day .   For the constipation and then if no help  contact the GI team.   Will refill the valtrex again . To your pharmacy .    Michele Monroe. Michele Monroe M.D.

## 2021-10-27 NOTE — Patient Instructions (Signed)
Michele Monroe , Thank you for taking time to come for your Medicare Wellness Visit. I appreciate your ongoing commitment to your health goals. Please review the following plan we discussed and let me know if I can assist you in the future.   Screening recommendations/referrals: Colonoscopy: completed 04/06/2021, due 04/06/2024 Mammogram: completed 07/08/2021, due 07/09/2022 Bone Density: completed 06/04/2019 Recommended yearly ophthalmology/optometry visit for glaucoma screening and checkup Recommended yearly dental visit for hygiene and checkup  Vaccinations: Influenza vaccine: decline Pneumococcal vaccine: completed 09/24/2012 Tdap vaccine: decline Shingles vaccine: decline   Covid-19: decline  Advanced directives: Advance directive discussed with you today. Even though you declined this today please call our office should you change your mind and we can give you the proper paperwork for you to fill out.  Conditions/risks identified: diagnosed with blood clot  Next appointment: Follow up in one year for your annual wellness visit    Preventive Care 75 Years and Older, Female Preventive care refers to lifestyle choices and visits with your health care provider that can promote health and wellness. What does preventive care include? A yearly physical exam. This is also called an annual well check. Dental exams once or twice a year. Routine eye exams. Ask your health care provider how often you should have your eyes checked. Personal lifestyle choices, including: Daily care of your teeth and gums. Regular physical activity. Eating a healthy diet. Avoiding tobacco and drug use. Limiting alcohol use. Practicing safe sex. Taking low-dose aspirin every day. Taking vitamin and mineral supplements as recommended by your health care provider. What happens during an annual well check? The services and screenings done by your health care provider during your annual well check will depend on your  age, overall health, lifestyle risk factors, and family history of disease. Counseling  Your health care provider may ask you questions about your: Alcohol use. Tobacco use. Drug use. Emotional well-being. Home and relationship well-being. Sexual activity. Eating habits. History of falls. Memory and ability to understand (cognition). Work and work Statistician. Reproductive health. Screening  You may have the following tests or measurements: Height, weight, and BMI. Blood pressure. Lipid and cholesterol levels. These may be checked every 5 years, or more frequently if you are over 30 years old. Skin check. Lung cancer screening. You may have this screening every year starting at age 75 if you have a 30-pack-year history of smoking and currently smoke or have quit within the past 15 years. Fecal occult blood test (FOBT) of the stool. You may have this test every year starting at age 75. Flexible sigmoidoscopy or colonoscopy. You may have a sigmoidoscopy every 5 years or a colonoscopy every 10 years starting at age 75. Hepatitis C blood test. Hepatitis B blood test. Sexually transmitted disease (STD) testing. Diabetes screening. This is done by checking your blood sugar (glucose) after you have not eaten for a while (fasting). You may have this done every 1-3 years. Bone density scan. This is done to screen for osteoporosis. You may have this done starting at age 75. Mammogram. This may be done every 1-2 years. Talk to your health care provider about how often you should have regular mammograms. Talk with your health care provider about your test results, treatment options, and if necessary, the need for more tests. Vaccines  Your health care provider may recommend certain vaccines, such as: Influenza vaccine. This is recommended every year. Tetanus, diphtheria, and acellular pertussis (Tdap, Td) vaccine. You may need a Td booster every 10  years. Zoster vaccine. You may need this after  age 75. Pneumococcal 13-valent conjugate (PCV13) vaccine. One dose is recommended after age 75. Pneumococcal polysaccharide (PPSV23) vaccine. One dose is recommended after age 74. Talk to your health care provider about which screenings and vaccines you need and how often you need them. This information is not intended to replace advice given to you by your health care provider. Make sure you discuss any questions you have with your health care provider. Document Released: 09/19/2015 Document Revised: 05/12/2016 Document Reviewed: 06/24/2015 Elsevier Interactive Patient Education  2017 [place] Prevention in the Home Falls can cause injuries. They can happen to people of all ages. There are many things you can do to make your home safe and to help prevent falls. What can I do on the outside of my home? Regularly fix the edges of walkways and driveways and fix any cracks. Remove anything that might make you trip as you walk through a door, such as a raised step or threshold. Trim any bushes or trees on the path to your home. Use bright outdoor lighting. Clear any walking paths of anything that might make someone trip, such as rocks or tools. Regularly check to see if handrails are loose or broken. Make sure that both sides of any steps have handrails. Any raised decks and porches should have guardrails on the edges. Have any leaves, snow, or ice cleared regularly. Use sand or salt on walking paths during winter. Clean up any spills in your garage right away. This includes oil or grease spills. What can I do in the bathroom? Use night lights. Install grab bars by the toilet and in the tub and shower. Do not use towel bars as grab bars. Use non-skid mats or decals in the tub or shower. If you need to sit down in the shower, use a plastic, non-slip stool. Keep the floor dry. Clean up any water that spills on the floor as soon as it happens. Remove soap buildup in the tub or shower  regularly. Attach bath mats securely with double-sided non-slip rug tape. Do not have throw rugs and other things on the floor that can make you trip. What can I do in the bedroom? Use night lights. Make sure that you have a light by your bed that is easy to reach. Do not use any sheets or blankets that are too big for your bed. They should not hang down onto the floor. Have a firm chair that has side arms. You can use this for support while you get dressed. Do not have throw rugs and other things on the floor that can make you trip. What can I do in the kitchen? Clean up any spills right away. Avoid walking on wet floors. Keep items that you use a lot in easy-to-reach places. If you need to reach something above you, use a strong step stool that has a grab bar. Keep electrical cords out of the way. Do not use floor polish or wax that makes floors slippery. If you must use wax, use non-skid floor wax. Do not have throw rugs and other things on the floor that can make you trip. What can I do with my stairs? Do not leave any items on the stairs. Make sure that there are handrails on both sides of the stairs and use them. Fix handrails that are broken or loose. Make sure that handrails are as long as the stairways. Check any carpeting to make sure  that it is firmly attached to the stairs. Fix any carpet that is loose or worn. Avoid having throw rugs at the top or bottom of the stairs. If you do have throw rugs, attach them to the floor with carpet tape. Make sure that you have a light switch at the top of the stairs and the bottom of the stairs. If you do not have them, ask someone to add them for you. What else can I do to help prevent falls? Wear shoes that: Do not have high heels. Have rubber bottoms. Are comfortable and fit you well. Are closed at the toe. Do not wear sandals. If you use a stepladder: Make sure that it is fully opened. Do not climb a closed stepladder. Make sure that  both sides of the stepladder are locked into place. Ask someone to hold it for you, if possible. Clearly mark and make sure that you can see: Any grab bars or handrails. First and last steps. Where the edge of each step is. Use tools that help you move around (mobility aids) if they are needed. These include: Canes. Walkers. Scooters. Crutches. Turn on the lights when you go into a dark area. Replace any light bulbs as soon as they burn out. Set up your furniture so you have a clear path. Avoid moving your furniture around. If any of your floors are uneven, fix them. If there are any pets around you, be aware of where they are. Review your medicines with your doctor. Some medicines can make you feel dizzy. This can increase your chance of falling. Ask your doctor what other things that you can do to help prevent falls. This information is not intended to replace advice given to you by your health care provider. Make sure you discuss any questions you have with your health care provider. Document Released: 06/19/2009 Document Revised: 01/29/2016 Document Reviewed: 09/27/2014 Elsevier Interactive Patient Education  2017 [employer].

## 2021-10-28 ENCOUNTER — Other Ambulatory Visit: Payer: Self-pay

## 2021-10-28 ENCOUNTER — Other Ambulatory Visit: Payer: Self-pay | Admitting: Internal Medicine

## 2021-10-28 ENCOUNTER — Telehealth: Payer: Self-pay

## 2021-10-28 ENCOUNTER — Encounter: Payer: Self-pay | Admitting: Internal Medicine

## 2021-10-28 DIAGNOSIS — W57XXXA Bitten or stung by nonvenomous insect and other nonvenomous arthropods, initial encounter: Secondary | ICD-10-CM

## 2021-10-28 DIAGNOSIS — K219 Gastro-esophageal reflux disease without esophagitis: Secondary | ICD-10-CM

## 2021-10-28 DIAGNOSIS — R112 Nausea with vomiting, unspecified: Secondary | ICD-10-CM

## 2021-10-28 NOTE — Telephone Encounter (Signed)
Spoke with patient. She is scheduled for UGI on 11/04/21. She is willing to come back in to be seen. Do you want her to follow up with you or previous provider?

## 2021-10-29 NOTE — Telephone Encounter (Signed)
Ok, thank you

## 2021-10-29 NOTE — Telephone Encounter (Signed)
Called the patient to schedule her follow up appointment. Left her a message on her voicemail that she is scheduled for 11/10/21 at 8:30 am with Vicie Mutters for follow up after the UGI on 11/04/21. Asked she call back to confirm or reschedule.

## 2021-10-29 NOTE — Telephone Encounter (Signed)
Spoke with the patient. She is planning on keeping the appointment for the UGI and for the office follow up here as scheduled. She reports she is moving stool a little better. The stool is softer now. She is taking 2 does of Miralax daily. She is going to take three doses daily to see if that improves her bowel movements. She will call us if she needs anything or has any concerns before her appointment.

## 2021-11-02 ENCOUNTER — Other Ambulatory Visit: Payer: Self-pay | Admitting: Internal Medicine

## 2021-11-04 ENCOUNTER — Ambulatory Visit (HOSPITAL_COMMUNITY)
Admission: RE | Admit: 2021-11-04 | Discharge: 2021-11-04 | Disposition: A | Discharge status: Home / Self Care | Payer: Medicare Other | Source: Ambulatory Visit | Attending: Physician Assistant | Admitting: Physician Assistant

## 2021-11-04 ENCOUNTER — Other Ambulatory Visit: Payer: Self-pay

## 2021-11-04 DIAGNOSIS — R112 Nausea with vomiting, unspecified: Secondary | ICD-10-CM | POA: Insufficient documentation

## 2021-11-04 DIAGNOSIS — K219 Gastro-esophageal reflux disease without esophagitis: Secondary | ICD-10-CM | POA: Diagnosis not present

## 2021-11-04 NOTE — Telephone Encounter (Signed)
Noted  

## 2021-11-04 NOTE — Telephone Encounter (Signed)
Had visit on feb 21

## 2021-11-06 ENCOUNTER — Ambulatory Visit: Payer: Medicare Other | Admitting: Rheumatology

## 2021-11-09 ENCOUNTER — Encounter: Payer: Self-pay | Admitting: Internal Medicine

## 2021-11-09 ENCOUNTER — Telehealth (INDEPENDENT_AMBULATORY_CARE_PROVIDER_SITE_OTHER): Payer: Medicare Other | Admitting: Internal Medicine

## 2021-11-09 ENCOUNTER — Telehealth (HOSPITAL_BASED_OUTPATIENT_CLINIC_OR_DEPARTMENT_OTHER): Payer: Self-pay | Admitting: Orthopaedic Surgery

## 2021-11-09 DIAGNOSIS — I824Y1 Acute embolism and thrombosis of unspecified deep veins of right proximal lower extremity: Secondary | ICD-10-CM

## 2021-11-09 DIAGNOSIS — M7989 Other specified soft tissue disorders: Secondary | ICD-10-CM

## 2021-11-09 DIAGNOSIS — Z79899 Other long term (current) drug therapy: Secondary | ICD-10-CM

## 2021-11-09 NOTE — Progress Notes (Signed)
11/10/2021 Michele Monroe [id number] November 19, 1945   ASSESSMENT AND PLAN:   Gastroesophageal reflux disease with esophagitis without hemorrhage Finish pantoprazole 40 mg twice daily, then can go back to her omeprazole twice daily that she has at home Diet and lifestyle discussed.  UGI without masses/lesions- will continue with EGD when safely able, continue PPI, call if any issues.  Follow up 2 months to discuss GERD/EGD  Irritable bowel syndrome, unspecified type Doing well with miralax twice daily, squatty potty and fiber.  CT AB and pelvis 10/2021 Colon 04/06/2021  Acute deep vein thrombosis (DVT) of distal vein of right lower extremity (HCC) Currently on Eliquis, will need to be on for 3-6 months, appears unprovoked, has appointment with rheumatology, UTD on MGM, recent normal CT AB/pelvis and colon, normal UGI without masses.   History of Present Illness:  76 y.o. female  with a past medical history of anxiety, depression, migraines, hypertension, hyperlipidemia, vitamin D deficiency, personal history of adenomatous colon polyps with family history of colon cancer at age 9 and others listed below, known to Dr. Silverio Decamp returns to clinic today for evaluation of GERD, nausea vomiting, constipation. Patient seen in the office 10/15/2021 that time had negative celiac panel, normal kidney and liver function, no anemia, white blood cell count slightly elevated at that time, negative H. pylori and negative GI pathogen panel. Patient went to the ER 10/16/2021 with right DVT, started on Eliquis, EGD was scheduled for March 27, have to reschedule.   Did obtain upper GI series which was unremarkable. Patient continued with reflux and constipation, started on 1 packet MiraLAX and Benefiber.States this is helping with squatty potty.  Continued reflux felt Protonix. Felt the omeprazole worked better but only the capsule.   She states she was not having any symptoms prior to 10/15/2021, then woke  up with her right leg very swollen, has never had blood clot before. No family history of blood clot.  No surgery, no injury. Had veins stripped when she was younger, left leg.  CT AB and pelvis 10/2021 Colon 04/06/2021 MGM uptodate  Previous GI history:  DG Shoulder Right  Result Date: 10/18/2021 CLINICAL DATA:  Chronic right shoulder pain EXAM: RIGHT SHOULDER - 2+ VIEW COMPARISON:  None. FINDINGS: Glenohumeral degenerative changes identified. Degenerative changes seen at the rotator cuff insertion site on the lateral humeral head. No fracture or dislocation. No other abnormalities. IMPRESSION: Degenerative changes as above. Electronically Signed   By: Dorise Bullion III M.D.   On: 10/18/2021 11:13   CT Abdomen Pelvis W Contrast  Result Date: 10/13/2021 CLINICAL DATA:  Nausea and diarrhea x1 month.  Decreased appetite. EXAM: CT ABDOMEN AND PELVIS WITH CONTRAST TECHNIQUE: Multidetector CT imaging of the abdomen and pelvis was performed using the standard protocol following bolus administration of intravenous contrast. RADIATION DOSE REDUCTION: This exam was performed according to the departmental dose-optimization program which includes automated exposure control, adjustment of the mA and/or kV according to patient size and/or use of iterative reconstruction technique. CONTRAST:  14m OMNIPAQUE IOHEXOL 300 MG/ML  SOLN COMPARISON:  None. FINDINGS: Lower chest: No acute abnormality. Hepatobiliary: No focal liver abnormality is seen. No gallstones, gallbladder wall thickening, or biliary dilatation. Pancreas: Unremarkable. No pancreatic ductal dilatation or surrounding inflammatory changes. Spleen: There is no mass enhancement or splenomegaly. There is a 1.7 cm cyst of the posteromedial aspect of the spleen measuring 9.8 Hounsfield units. Adrenals/Urinary Tract: There is no adrenal mass, no focal abnormality in the bilateral renal cortex and no  evidence of urinary stones or obstruction. There is no  bladder thickening. Both ureters insert low in the pelvis consistent with pelvic floor relaxation, without evidence of bladder prolapse. Stomach/Bowel: There are moderate thickened folds of the stomach. Minimally small hiatal hernia. The small bowel fills normally without evidence of obstruction or inflammation. There is a 3 cm diverticulum of the distal descending duodenum, with air contrast level and thin wall. The appendix is normal. There is moderate to severe stool retention in the ascending and transverse colon mild fecal stasis in the left colon, scattered left colon diverticula without evidence of colitis or diverticulitis. There are undigested medication tablets in the distal descending and sigmoid colon. Vascular/Lymphatic: Aortic atherosclerosis. No enlarged abdominal or pelvic lymph nodes. Reproductive: Uterus and bilateral adnexa are unremarkable. Other: There are small umbilical and inguinal fat hernias. There is no free air, hemorrhage or fluid. Musculoskeletal: There is osteopenia with degenerative changes in the thoracic and lumbar spine, slight lumbar levoscoliosis. No destructive bone lesions. IMPRESSION: 1. Constipation with fecal retention greatest in the ascending and transverse colon, with left colonic diverticula without evidence of acute diverticulitis or colitis. 2. No small bowel obstruction or inflammation. 3 cm descending duodenal thin walled diverticulum. 3. Probable gastritis. 4. Small splenic cyst. 5. Low pelvic ureteral insertions consistent with pelvic floor relaxation. No upstream hydroureteronephrosis or bladder prolapse. 6. Umbilical and inguinal fat hernias. 7. Aortic atherosclerosis. 8. Osteopenia and degenerative change. Electronically Signed   By: Telford Nab M.D.   On: 10/13/2021 22:24   US Venous Img Lower Right (DVT Study)  Result Date: 10/16/2021 CLINICAL DATA:  Right lower extremity swelling for 3 days EXAM: RIGHT LOWER EXTREMITY VENOUS DOPPLER ULTRASOUND  TECHNIQUE: Gray-scale sonography with graded compression, as well as color Doppler and duplex ultrasound were performed to evaluate the lower extremity deep venous systems from the level of the common femoral vein and including the common femoral, femoral, profunda femoral, popliteal and calf veins including the posterior tibial, peroneal and gastrocnemius veins when visible. The superficial great saphenous vein was also interrogated. Spectral Doppler was utilized to evaluate flow at rest and with distal augmentation maneuvers in the common femoral, femoral and popliteal veins. COMPARISON:  None. FINDINGS: Contralateral Common Femoral Vein: Respiratory phasicity is normal and symmetric with the symptomatic side. No evidence of thrombus. Normal compressibility. Common Femoral Vein: No evidence of thrombus. Normal compressibility, respiratory phasicity and response to augmentation. Saphenofemoral Junction: No evidence of thrombus. Normal compressibility and flow on color Doppler imaging. Profunda Femoral Vein: No evidence of thrombus. Normal compressibility and flow on color Doppler imaging. Femoral Vein: Mixed echogenicity intraluminal thrombus. Vessel is noncompressible. Thrombus appears occlusive throughout the femoral vein. Popliteal Vein: Mixed echogenicity intraluminal thrombus appearing occlusive. Vessel noncompressible. Calf Veins: Thrombus does appear to extend into the tibial and peroneal veins which are also noncompressible. IMPRESSION: Positive exam for right lower extremity occlusive appearing femoropopliteal DVT extending into the calf veins. Electronically Signed   By: Jerilynn Mages.  Shick M.D.   On: 10/16/2021 12:58   DG Knee Complete 4 Views Right  Result Date: 10/18/2021 CLINICAL DATA:  Chronic left knee pain.  No known injury. EXAM: RIGHT KNEE - COMPLETE 4+ VIEW COMPARISON:  None. FINDINGS: No evidence of fracture, dislocation, or joint effusion. No evidence of arthropathy or other focal bone abnormality.  Soft tissues are unremarkable. IMPRESSION: Negative. Electronically Signed   By: Dorise Bullion III M.D.   On: 10/18/2021 11:12   DG UGI W SINGLE CM (SOL OR THIN BA)  Result Date: 11/04/2021 CLINICAL DATA:  Nausea and vomiting.  Reflux for several years. EXAM: DG UGI W SINGLE CM TECHNIQUE: Scout radiograph was obtained. Combined double and single contrast examination was performed using effervescent crystals, high-density barium and thin liquid barium. This exam was performed by Rowe Robert, PA, and was supervised and interpreted by Kerby Moors, MD. FLUOROSCOPY: Radiation Exposure Index (as provided by the fluoroscopic device): 87.5 mGy COMPARISON:  NONE. FINDINGS: Scout Radiograph:  The scout radiograph is within normal limits. Esophagus: The swallowing mechanism appears within normal limits. No signs aspiration. The esophagus is patent. No stricture or mass identified. Esophageal motility: Within normal limits. Gastroesophageal reflux: None visualized. Ingested 40m barium tablet: Passed normally. Stomach: Normal appearance. No hiatal hernia. Gastric emptying: Normal. Duodenum: Normal appearance. Other:  None. IMPRESSION: 1. Normal upper GI. Electronically Signed   By: TKerby MoorsM.D.   On: 11/04/2021 13:05    Current Medications:    Current Outpatient Medications (Cardiovascular):    atenolol (TENORMIN) 25 MG tablet, Take 1 tablet (25 mg total) by mouth daily.   atorvastatin (LIPITOR) 20 MG tablet, Take 1 tablet by mouth every day  Current Outpatient Medications (Respiratory):    albuterol (VENTOLIN HFA) 108 (90 Base) MCG/ACT inhaler, Inhale 2 puffs by mouth every 6 hours as needed. LAST refill pt needs appt.  Current Outpatient Medications (Analgesics):    rizatriptan (MAXALT) 10 MG tablet, Take 1 tablet at the onset of migraine, may repeat in 2 hours. Max of 2 doses per day  Current Outpatient Medications (Hematological):    apixaban (ELIQUIS) 5 MG TABS tablet, Take 1 tablet (5 mg  total) by mouth 2 (two) times daily.   vitamin B-12 (CYANOCOBALAMIN) 500 MCG tablet, Take 500 mcg by mouth daily.  Current Outpatient Medications (Other):    Ascorbic Acid (VITAMIN C) 1000 MG tablet, Take 1,000 mg by mouth daily.   carbamide peroxide (DEBROX) 6.5 % OTIC solution, Place 5 drops into both ears 2 (two) times daily.   cholecalciferol (VITAMIN D3) 25 MCG (1000 UNIT) tablet, Take 1,000 Units by mouth daily.   ondansetron (ZOFRAN-ODT) 4 MG disintegrating tablet, TAKE 1 TABLET BY MOUTH EVERY 8 HOURS AS NEEDED FOR NAUSEA AND VOMITING   pantoprazole (PROTONIX) 40 MG tablet, Take 1 tablet (40 mg total) by mouth daily.   polyethylene glycol (MIRALAX / GLYCOLAX) 17 g packet, Take 17 g by mouth 2 (two) times daily.   Probiotic Product (PROBIOTIC ACIDOPHILUS BEADS PO), Take by mouth.   senna-docusate (SENOKOT S) 8.6-50 MG tablet, Take 1 tablet by mouth at bedtime as needed for mild constipation.   tiZANidine (ZANAFLEX) 4 MG tablet, Take one-half to one tablet by mouth at bedtime as needed for neck pain   valACYclovir (VALTREX) 1000 MG tablet, Take 2 tablets (2,000 mg total) by mouth 2 (two) times daily.   Vitamins-Lipotropics (B-100 CR PO), Take by mouth.   Wheat Dextrin (BENEFIBER) POWD, Use as directed BID   Zinc 30 MG CAPS, Take by mouth.  Surgical History:  She  has a past surgical history that includes Rotator cuff repair (Right); Breast surgery; feet; varicose veins; Colonoscopy (2016); Polypectomy (2016); and Breast cyst aspiration. Family History:  Her family history includes Arthritis in her brother; Breast cancer in her cousin; Colon cancer in her mother; Colon polyps in her brother and mother; Other in her brother. Social History:   reports that she quit smoking about 45 years ago. Her smoking use included cigarettes. She has never used smokeless tobacco. She  reports that she does not drink alcohol and does not use drugs.  Current Medications, Allergies, Past Medical History,  Past Surgical History, Family History and Social History were reviewed in [employer] record.  Physical Exam: BP 126/78    Pulse 83    Ht '5\' 5"'$  (1.651 m)    Wt 172 lb (78 kg)    SpO2 97%    BMI 28.62 kg/m  General:   Pleasant, well developed female in no acute distress Eyes: sclerae anicteric,conjunctive pink  Heart:  regular rate and rhythm Pulm: Clear anteriorly; no wheezing Abdomen:  Soft, Obese AB,  Normal bowel sounds. mild tenderness in the epigastrium and in the LLQ. Without guarding and Without rebound, without hepatomegaly. Extremities:  Without edema at this time, bilateral varicose veins. Peripheral pulses intact.  Neurologic:  Alert and  oriented x4;  grossly normal neurologically. Skin:   Dry and intact without significant lesions or rashes. Psychiatric: Demonstrates good judgement and reason without abnormal affect or behaviors.  Vladimir Crofts, PA-C 11/10/21

## 2021-11-09 NOTE — Progress Notes (Signed)
?Virtual Visit via Video Note ? ?I connected with Michele Monroe on 11/09/21 at  4:00 PM EST by a video enabled telemedicine application and verified that I am speaking with the correct person using two identifiers. ?Location patient: home ?Location provider:work office ?Persons participating in the virtual visit: patient, provider ? ?[place] national recommendations  regarding COVID 19 pandemic   video visit is advised over in office visit for this patient.  ?Patient aware  of the limitations of evaluation and management by telemedicine and  availability of in person appointments. and agreed to proceed. ? ? ?HPI: ?Michele Monroe presents for video visit ?Sda requested visit   legs hurt and swollen  see  message form ortho  ?She has been taking  ?Has question about the Valtrex she has been taking 2 twice a day and her mouth is cleared up has a refill but wonders how she should take it and could it be less. ?She is on her refill of Eliquis taking it without difficulty.  States that her right leg is still swells up and hurts ?If she raises it for a while it improves but then when she is up walking makes it worse.  She thinks it should be improved more than it is.  It is not worse. ?"Wants to know what is going on" and also what to expect. ?No active bleeding. ?She does have a follow-up visit with GI department tomorrow. ?ROS: See pertinent positives and negatives per HPI. ? ?Past Medical History:  ?Diagnosis Date  ? Adenomatous colon polyp   ? ALLERGIC RHINITIS 06/30/2010  ? perennial  ? Allergy   ? seasonal allergies  ? Anxiety   ? hx of  ? Arthritis   ? bilateral hands (R>L)  ? Coughing up blood 08/13/2012  ? piece of tissue? will send to path poss from upper airway sinusitis  Final path was "abscess"   ? Depression   ? hx of  ? GERD 06/30/2010  ? on meds  ? Headache(784.0) 06/30/2010  ? migraines  ? Hepatic steatosis   ? HYPERLIPIDEMIA 06/30/2010  ? on meds  ? HYPERTENSION 06/30/2010  ? on meds  ? IBS 06/30/2010  ?  Internal hemorrhoids   ? Renal lesion   ? Varicose veins   ? VITAMIN D DEFICIENCY 06/30/2010  ? ? ?Past Surgical History:  ?Procedure Laterality Date  ? BREAST CYST ASPIRATION    ? BREAST SURGERY    ? needle bx left breast  ? COLONOSCOPY  2016  ? KN-prep good-TA  ? feet    ? hammer toe sx  ? POLYPECTOMY  2016  ? TA  ? ROTATOR CUFF REPAIR Right   ? varicose veins    ? ? ?Family History  ?Problem Relation Age of Onset  ? Colon cancer Mother   ?     age 23  ? Colon polyps Mother   ? Breast cancer Cousin   ? Arthritis Brother   ? Other Brother   ? Colon polyps Brother   ? Esophageal cancer Neg Hx   ? Stomach cancer Neg Hx   ? Rectal cancer Neg Hx   ? ? ?Social History  ? ?Tobacco Use  ? Smoking status: Former  ?  Types: Cigarettes  ?  Quit date: 09/06/1976  ?  Years since quitting: 45.2  ? Smokeless tobacco: Never  ?Vaping Use  ? Vaping Use: Never used  ?Substance Use Topics  ? Alcohol use: No  ?  Alcohol/week: 0.0 standard  drinks  ?  Comment: rarely  ? Drug use: No  ? ? ? ? ?Current Outpatient Medications:  ?  albuterol (VENTOLIN HFA) 108 (90 Base) MCG/ACT inhaler, Inhale 2 puffs by mouth every 6 hours as needed. LAST refill pt needs appt., Disp: 8.5 each, Rfl: 0 ?  apixaban (ELIQUIS) 5 MG TABS tablet, Take 1 tablet (5 mg total) by mouth 2 (two) times daily., Disp: 60 tablet, Rfl: 3 ?  Ascorbic Acid (VITAMIN C) 1000 MG tablet, Take 1,000 mg by mouth daily., Disp: , Rfl:  ?  atenolol (TENORMIN) 25 MG tablet, Take 1 tablet (25 mg total) by mouth daily., Disp: 90 tablet, Rfl: 1 ?  atorvastatin (LIPITOR) 20 MG tablet, Take 1 tablet by mouth every day, Disp: 30 tablet, Rfl: 11 ?  carbamide peroxide (DEBROX) 6.5 % OTIC solution, Place 5 drops into both ears 2 (two) times daily., Disp: 15 mL, Rfl: 0 ?  cholecalciferol (VITAMIN D3) 25 MCG (1000 UNIT) tablet, Take 1,000 Units by mouth daily., Disp: , Rfl:  ?  ondansetron (ZOFRAN-ODT) 4 MG disintegrating tablet, TAKE 1 TABLET BY MOUTH EVERY 8 HOURS AS NEEDED FOR NAUSEA AND  VOMITING, Disp: 30 tablet, Rfl: 0 ?  pantoprazole (PROTONIX) 40 MG tablet, Take 1 tablet (40 mg total) by mouth daily., Disp: 30 tablet, Rfl: 3 ?  polyethylene glycol (MIRALAX / GLYCOLAX) 17 g packet, Take 17 g by mouth 2 (two) times daily., Disp: 30 packet, Rfl: 3 ?  Probiotic Product (PROBIOTIC ACIDOPHILUS BEADS PO), Take by mouth., Disp: , Rfl:  ?  rizatriptan (MAXALT) 10 MG tablet, Take 1 tablet at the onset of migraine, may repeat in 2 hours. Max of 2 doses per day, Disp: 11 tablet, Rfl: 0 ?  senna-docusate (SENOKOT S) 8.6-50 MG tablet, Take 1 tablet by mouth at bedtime as needed for mild constipation., Disp: , Rfl:  ?  tiZANidine (ZANAFLEX) 4 MG tablet, Take one-half to one tablet by mouth at bedtime as needed for neck pain, Disp: 30 tablet, Rfl: 11 ?  valACYclovir (VALTREX) 1000 MG tablet, Take 2 tablets (2,000 mg total) by mouth 2 (two) times daily., Disp: 30 tablet, Rfl: 1 ?  vitamin B-12 (CYANOCOBALAMIN) 500 MCG tablet, Take 500 mcg by mouth daily., Disp: , Rfl:  ?  Vitamins-Lipotropics (B-100 CR PO), Take by mouth., Disp: , Rfl:  ?  Wheat Dextrin (BENEFIBER) POWD, Use as directed BID, Disp: , Rfl: 0 ?  Zinc 30 MG CAPS, Take by mouth., Disp: , Rfl:  ? ?Current Facility-Administered Medications:  ?  0.9 %  sodium chloride infusion, 500 mL, Intravenous, Once, Nandigam, Venia Minks, MD ? ?EXAM: ?BP Readings from Last 3 Encounters:  ?10/27/21 112/64  ?10/27/21 112/64  ?10/16/21 (!) 116/55  ? ? ?VITALS per patient if applicable: ? ?GENERAL: alert, oriented, appears well and in no acute distress ? ?HEENT: atraumatic, conjunttiva clear, no obvious abnormalities on inspection of external nose and ears ? ?LUNGS: on inspection no signs of respiratory distress, breathing rate appears normal, no obvious gross SOB, gasping or wheezing ?CV: no obvious cyanosis ? ?PSYCH/NEURO: pleasant and cooperative, no obvious depression or anxiety, speech and thought processing grossly intact ?Lab Results  ?Component Value Date  ? WBC  10.3 10/16/2021  ? HGB 12.4 10/16/2021  ? HCT 38.3 10/16/2021  ? PLT 175 10/16/2021  ? GLUCOSE 194 (H) 10/16/2021  ? CHOL 162 04/08/2021  ? TRIG 185.0 (H) 04/08/2021  ? HDL 49.90 04/08/2021  ? [place] 75 04/08/2021  ? ALT 21  10/15/2021  ? AST 20 10/15/2021  ? NA 137 10/16/2021  ? K 3.9 10/16/2021  ? CL 103 10/16/2021  ? CREATININE 0.74 10/16/2021  ? BUN 7 (L) 10/16/2021  ? CO2 26 10/16/2021  ? TSH 1.85 04/08/2021  ? ?Reported last vascular ultrasound was February 10 ?IMPRESSION: ?Positive exam for right lower extremity occlusive appearing ?femoropopliteal DVT extending into the calf veins. ?  ?  ?ASSESSMENT AND PLAN: ? ?Discussed the following assessment and plan: ? ?  ICD-10-CM   ?1. Acute deep vein thrombosis (DVT) of proximal vein of right lower extremity (HCC)  I82.4Y1 VAS [place] LOWER EXTREMITY VENOUS (DVT)  ? on anticoag about 3 + weeks   ?  ?2. Right leg swelling  M79.89 VAS [place] LOWER EXTREMITY VENOUS (DVT)  ? w pain  ?  ?3. Medication management  Z79.899   ? valtrex prn cold sore mouth ulcer  ?  ? ?Stay on anticoagulation we will order an updated ultrasound to make sure clot has not extended although it may just be this was an extensive clot and taking longer. ?Can stop the Valtrex on a daily basis and just take it as needed 2 g twice daily at the onset of symptoms for oral HSV.  Patient understands. ?Keep appointment with the GI department tomorrow. ?Plan follow-up depending on results. ?Counseled.  ? Expectant management and discussion of plan and treatment with opportunity to ask questions and all were answered. The patient agreed with the plan and demonstrated an understanding of the instructions. ?  ?Advised to call back or seek an in-person evaluation if worsening  or having  further concerns  in interim. ?Return for depending on results and how doing. ? ? ? ?Shanon Ace, MD  ?

## 2021-11-09 NOTE — Telephone Encounter (Signed)
DVT question ?

## 2021-11-10 ENCOUNTER — Ambulatory Visit: Payer: Medicare Other | Admitting: Physician Assistant

## 2021-11-10 ENCOUNTER — Encounter: Payer: Self-pay | Admitting: Physician Assistant

## 2021-11-10 VITALS — BP 126/78 | HR 83 | Ht 65.0 in | Wt 172.0 lb

## 2021-11-10 DIAGNOSIS — I824Z1 Acute embolism and thrombosis of unspecified deep veins of right distal lower extremity: Secondary | ICD-10-CM

## 2021-11-10 DIAGNOSIS — K589 Irritable bowel syndrome without diarrhea: Secondary | ICD-10-CM | POA: Diagnosis not present

## 2021-11-10 DIAGNOSIS — I83819 Varicose veins of unspecified lower extremities with pain: Secondary | ICD-10-CM | POA: Diagnosis not present

## 2021-11-10 DIAGNOSIS — K2100 Gastro-esophageal reflux disease with esophagitis, without bleeding: Secondary | ICD-10-CM

## 2021-11-10 NOTE — Patient Instructions (Addendum)
Can finish pantoprazole daily ?Can do the omeprazole 20 mg tablet twice a day ? ?Please take your proton pump inhibitor medication 30 minutes to 1 hour before meals- this makes it more effective.  ?Avoid spicy and acidic foods ?Avoid fatty foods ?Limit your intake of coffee, tea, alcohol, and carbonated drinks ?Work to maintain a healthy weight ?Keep the head of the bed elevated at least 3 inches with blocks or a wedge pillow if you are having any nighttime symptoms ?Stay upright for 2 hours after eating ?Avoid meals and snacks three to four hours before bedtime ? ?Gastroesophageal Reflux Disease, Adult ?Gastroesophageal reflux (GER) happens when acid from the stomach flows up into the tube that connects the mouth and the stomach (esophagus). Normally, food travels down the esophagus and stays in the stomach to be digested. However, when a person has GER, food and stomach acid sometimes move back up into the esophagus. If this becomes a more serious problem, the person may be diagnosed with a disease called gastroesophageal reflux disease (GERD). GERD occurs when the reflux: ?Happens often. ?Causes frequent or severe symptoms. ?Causes problems such as damage to the esophagus. ?When stomach acid comes in contact with the esophagus, the acid may cause inflammation in the esophagus. Over time, GERD may create small holes (ulcers) in the lining of the esophagus. ?What are the causes? ?This condition is caused by a problem with the muscle between the esophagus and the stomach (lower esophageal sphincter, or LES). Normally, the LES muscle closes after food passes through the esophagus to the stomach. When the LES is weakened or abnormal, it does not close properly, and that allows food and stomach acid to go back up into the esophagus. ?The LES can be weakened by certain dietary substances, medicines, and medical conditions, including: ?Tobacco use. ?Pregnancy. ?Having a hiatal hernia. ?Alcohol use. ?Certain foods and  beverages, such as coffee, chocolate, onions, and peppermint. ?What increases the risk? ?You are more likely to develop this condition if you: ?Have an increased body weight. ?Have a connective tissue disorder. ?Take NSAIDs, such as ibuprofen. ?What are the signs or symptoms? ?Symptoms of this condition include: ?Heartburn. ?Difficult or painful swallowing and the feeling of having a lump in the throat. ?A bitter taste in the mouth. ?Bad breath and having a large amount of saliva. ?Having an upset or bloated stomach and belching. ?Chest pain. Different conditions can cause chest pain. Make sure you see your health care provider if you experience chest pain. ?Shortness of breath or wheezing. ?Ongoing (chronic) cough or a nighttime cough. ?Wearing away of tooth enamel. ?Weight loss. ?How is this diagnosed? ?This condition may be diagnosed based on a medical history and a physical exam. To determine if you have mild or severe GERD, your health care provider may also monitor how you respond to treatment. You may also have tests, including: ?A test to examine your stomach and esophagus with a small camera (endoscopy). ?A test that measures the acidity level in your esophagus. ?A test that measures how much pressure is on your esophagus. ?A barium swallow or modified barium swallow test to show the shape, size, and functioning of your esophagus. ?How is this treated? ?Treatment for this condition may vary depending on how severe your symptoms are. Your health care provider may recommend: ?Changes to your diet. ?Medicine. ?Surgery. ?The goal of treatment is to help relieve your symptoms and to prevent complications. ?Follow these instructions at home: ?Eating and drinking ? ?Follow a diet as  recommended by your health care provider. This may involve avoiding foods and drinks such as: ?Coffee and tea, with or without caffeine. ?Drinks that contain alcohol. ?Energy drinks and sports drinks. ?Carbonated drinks or  sodas. ?Chocolate and cocoa. ?Peppermint and mint flavorings. ?Garlic and onions. ?Horseradish. ?Spicy and acidic foods, including peppers, chili powder, curry powder, vinegar, hot sauces, and barbecue sauce. ?Citrus fruit juices and citrus fruits, such as oranges, lemons, and limes. ?Tomato-based foods, such as red sauce, chili, salsa, and pizza with red sauce. ?Fried and fatty foods, such as donuts, french fries, potato chips, and high-fat dressings. ?High-fat meats, such as hot dogs and fatty cuts of red and white meats, such as rib eye steak, sausage, ham, and bacon. ?High-fat dairy items, such as whole milk, butter, and cream cheese. ?Eat small, frequent meals instead of large meals. ?Avoid drinking large amounts of liquid with your meals. ?Avoid eating meals during the 2-3 hours before bedtime. ?Avoid lying down right after you eat. ?Do not exercise right after you eat. ?Lifestyle ? ?Do not use any products that contain nicotine or tobacco. These products include cigarettes, chewing tobacco, and vaping devices, such as e-cigarettes. If you need help quitting, ask your health care provider. ?Try to reduce your stress by using methods such as yoga or meditation. If you need help reducing stress, ask your health care provider. ?If you are overweight, reduce your weight to an amount that is healthy for you. Ask your health care provider for guidance about a safe weight loss goal. ?General instructions ?Pay attention to any changes in your symptoms. ?Take over-the-counter and prescription medicines only as told by your health care provider. Do not take aspirin, ibuprofen, or other NSAIDs unless your health care provider told you to take these medicines. ?Wear loose-fitting clothing. Do not wear anything tight around your waist that causes pressure on your abdomen. ?Raise (elevate) the head of your bed about 6 inches (15 cm). You can use a wedge to do this. ?Avoid bending over if this makes your symptoms  worse. ?Keep all follow-up visits. This is important. ?Contact a health care provider if: ?You have: ?New symptoms. ?Unexplained weight loss. ?Difficulty swallowing or it hurts to swallow. ?Wheezing or a persistent cough. ?A hoarse voice. ?Your symptoms do not improve with treatment. ?Get help right away if: ?You have sudden pain in your arms, neck, jaw, teeth, or back. ?You suddenly feel sweaty, dizzy, or light-headed. ?You have chest pain or shortness of breath. ?You vomit and the vomit is green, yellow, or black, or it looks like blood or coffee grounds. ?You faint. ?You have stool that is red, bloody, or black. ?You cannot swallow, drink, or eat. ?These symptoms may represent a serious problem that is an emergency. Do not wait to see if the symptoms will go away. Get medical help right away. Call your local emergency services (911 in the [place] Do not drive yourself to the hospital. ?Summary ?Gastroesophageal reflux happens when acid from the stomach flows up into the esophagus. GERD is a disease in which the reflux happens often, causes frequent or severe symptoms, or causes problems such as damage to the esophagus. ?Treatment for this condition may vary depending on how severe your symptoms are. Your health care provider may recommend diet and lifestyle changes, medicine, or surgery. ?Contact a health care provider if you have new or worsening symptoms. ?Take over-the-counter and prescription medicines only as told by your health care provider. Do not take aspirin, ibuprofen, or other NSAIDs  unless your health care provider told you to do so. ?Keep all follow-up visits as told by your health care provider. This is important. ?This information is not intended to replace advice given to you by your health care provider. Make sure you discuss any questions you have with your health care provider. ?Document Revised: 03/03/2020 Document Reviewed: 03/03/2020 ?Elsevier Patient Education ? [employer]. ? ?

## 2021-11-13 ENCOUNTER — Ambulatory Visit (HOSPITAL_BASED_OUTPATIENT_CLINIC_OR_DEPARTMENT_OTHER): Payer: Medicare Other | Admitting: Orthopaedic Surgery

## 2021-11-17 ENCOUNTER — Encounter: Payer: Self-pay | Admitting: Internal Medicine

## 2021-11-17 ENCOUNTER — Other Ambulatory Visit: Payer: Self-pay

## 2021-11-17 DIAGNOSIS — I824Y1 Acute embolism and thrombosis of unspecified deep veins of right proximal lower extremity: Secondary | ICD-10-CM

## 2021-11-17 DIAGNOSIS — M7989 Other specified soft tissue disorders: Secondary | ICD-10-CM

## 2021-11-17 NOTE — Telephone Encounter (Signed)
I ordered a vascular US for her leg  cause of dvt and swelling pain on anticoagulation.  ?Contact referral coordinator about   getting this done

## 2021-11-22 ENCOUNTER — Encounter: Payer: Self-pay | Admitting: Internal Medicine

## 2021-11-23 MED ORDER — BENZONATATE 100 MG PO CAPS: 100.0000 mg | ORAL_CAPSULE | Freq: Two times a day (BID) | ORAL | 1 refills | Status: DC | PRN

## 2021-11-23 NOTE — Telephone Encounter (Signed)
If mucinex DM doesn help   ?Can try  tessalon perles  cough pills <I will send in to pharmacy  ?

## 2021-11-23 NOTE — Telephone Encounter (Signed)
Get more information  what has she tried etc etc  does she need a visit?

## 2021-11-23 NOTE — Telephone Encounter (Signed)
Spoke with patient and informed of message.

## 2021-11-24 ENCOUNTER — Encounter: Payer: Medicare Other | Admitting: Gastroenterology

## 2021-11-24 ENCOUNTER — Other Ambulatory Visit: Payer: Self-pay

## 2021-11-24 ENCOUNTER — Telehealth: Payer: Medicare Other | Admitting: Internal Medicine

## 2021-11-26 ENCOUNTER — Telehealth (INDEPENDENT_AMBULATORY_CARE_PROVIDER_SITE_OTHER): Payer: Medicare Other | Admitting: Internal Medicine

## 2021-11-26 ENCOUNTER — Encounter: Payer: Self-pay | Admitting: Internal Medicine

## 2021-11-26 DIAGNOSIS — I824Y1 Acute embolism and thrombosis of unspecified deep veins of right proximal lower extremity: Secondary | ICD-10-CM | POA: Diagnosis not present

## 2021-11-26 DIAGNOSIS — M7989 Other specified soft tissue disorders: Secondary | ICD-10-CM | POA: Diagnosis not present

## 2021-11-26 DIAGNOSIS — R058 Other specified cough: Secondary | ICD-10-CM

## 2021-11-26 NOTE — Progress Notes (Signed)
The referral was sent to [facility] on 11/17/21 By Sherri . They will contact pt and schedule [phone] ?

## 2021-11-26 NOTE — Progress Notes (Signed)
?Virtual Visit via Video Note ? ?I connected with Michele Monroe on 11/26/21 at 10:30 AM EDT by a video enabled telemedicine application and verified that I am speaking with the correct person using two identifiers. ?Location patient: home ?Location provider: home office ?Persons participating in the virtual visit: patient, provider ? ?[place] national recommendations  regarding COVID 19 pandemic   video visit is advised over in office visit for this patient.  ?Patient aware  of the limitations of evaluation and management by telemedicine and  availability of in person appointments. and agreed to proceed. ? ? ?HPI: ?Michele Monroe presents for video visit presents with another resp illness and coughing   ?Past hx of  sinusiits  problems symptoms have been present about a week with upper respiratory drainage but no sinus pain or fever but a cough that at some point was like coughing her head often very spasmodic.  Medication Tessalon Perles sent and was been helpful so she can sleep at night and she got some Mucinex DM. ?No fever chest pain shortness of breath at this time.  Grandchild has not been sick with this and no recent exposure.  Some of it may feel like allergy. ?No hemoptysis  cp sob. ? ?In regard to her leg DVT the swelling and blueness areas around her ankle has not heard about a follow-up vascular yet.  But not worse. ? ? ? ?Does get allergies  ?On anticoag since dvt dx feb  23 ?ROS: See pertinent positives and negatives per HPI. ? ?Past Medical History:  ?Diagnosis Date  ? Adenomatous colon polyp   ? ALLERGIC RHINITIS 06/30/2010  ? perennial  ? Allergy   ? seasonal allergies  ? Anxiety   ? hx of  ? Arthritis   ? bilateral hands (R>L)  ? Coughing up blood 08/13/2012  ? piece of tissue? will send to path poss from upper airway sinusitis  Final path was "abscess"   ? Depression   ? hx of  ? GERD 06/30/2010  ? on meds  ? Headache(784.0) 06/30/2010  ? migraines  ? Hepatic steatosis   ? HYPERLIPIDEMIA 06/30/2010  ?  on meds  ? HYPERTENSION 06/30/2010  ? on meds  ? IBS 06/30/2010  ? Internal hemorrhoids   ? Renal lesion   ? Varicose veins   ? VITAMIN D DEFICIENCY 06/30/2010  ? ? ?Past Surgical History:  ?Procedure Laterality Date  ? BREAST CYST ASPIRATION    ? BREAST SURGERY    ? needle bx left breast  ? COLONOSCOPY  2016  ? KN-prep good-TA  ? feet    ? hammer toe sx  ? POLYPECTOMY  2016  ? TA  ? ROTATOR CUFF REPAIR Right   ? varicose veins    ? ? ?Family History  ?Problem Relation Age of Onset  ? Colon cancer Mother   ?     age 15  ? Colon polyps Mother   ? Breast cancer Cousin   ? Arthritis Brother   ? Other Brother   ? Colon polyps Brother   ? Esophageal cancer Neg Hx   ? Stomach cancer Neg Hx   ? Rectal cancer Neg Hx   ? ? ?Social History  ? ?Tobacco Use  ? Smoking status: Former  ?  Types: Cigarettes  ?  Quit date: 09/06/1976  ?  Years since quitting: 45.2  ? Smokeless tobacco: Never  ?Vaping Use  ? Vaping Use: Never used  ?Substance Use Topics  ? Alcohol  use: No  ?  Alcohol/week: 0.0 standard drinks  ?  Comment: rarely  ? Drug use: No  ? ? ? ? ?Current Outpatient Medications:  ?  albuterol (VENTOLIN HFA) 108 (90 Base) MCG/ACT inhaler, Inhale 2 puffs by mouth every 6 hours as needed. LAST refill pt needs appt., Disp: 8.5 each, Rfl: 0 ?  apixaban (ELIQUIS) 5 MG TABS tablet, Take 1 tablet (5 mg total) by mouth 2 (two) times daily., Disp: 60 tablet, Rfl: 3 ?  Ascorbic Acid (VITAMIN C) 1000 MG tablet, Take 1,000 mg by mouth daily., Disp: , Rfl:  ?  atenolol (TENORMIN) 25 MG tablet, Take 1 tablet (25 mg total) by mouth daily., Disp: 90 tablet, Rfl: 1 ?  atorvastatin (LIPITOR) 20 MG tablet, Take 1 tablet by mouth every day, Disp: 30 tablet, Rfl: 11 ?  benzonatate (TESSALON PERLES) 100 MG capsule, Take 1-2 capsules (100-200 mg total) by mouth 2 (two) times daily as needed for cough., Disp: 21 capsule, Rfl: 1 ?  carbamide peroxide (DEBROX) 6.5 % OTIC solution, Place 5 drops into both ears 2 (two) times daily., Disp: 15 mL, Rfl: 0 ?   cholecalciferol (VITAMIN D3) 25 MCG (1000 UNIT) tablet, Take 1,000 Units by mouth daily., Disp: , Rfl:  ?  ondansetron (ZOFRAN-ODT) 4 MG disintegrating tablet, TAKE 1 TABLET BY MOUTH EVERY 8 HOURS AS NEEDED FOR NAUSEA AND VOMITING, Disp: 30 tablet, Rfl: 0 ?  pantoprazole (PROTONIX) 40 MG tablet, Take 1 tablet (40 mg total) by mouth daily., Disp: 30 tablet, Rfl: 3 ?  polyethylene glycol (MIRALAX / GLYCOLAX) 17 g packet, Take 17 g by mouth 2 (two) times daily., Disp: 30 packet, Rfl: 3 ?  Probiotic Product (PROBIOTIC ACIDOPHILUS BEADS PO), Take by mouth., Disp: , Rfl:  ?  rizatriptan (MAXALT) 10 MG tablet, Take 1 tablet at the onset of migraine, may repeat in 2 hours. Max of 2 doses per day, Disp: 11 tablet, Rfl: 0 ?  senna-docusate (SENOKOT S) 8.6-50 MG tablet, Take 1 tablet by mouth at bedtime as needed for mild constipation., Disp: , Rfl:  ?  tiZANidine (ZANAFLEX) 4 MG tablet, Take one-half to one tablet by mouth at bedtime as needed for neck pain, Disp: 30 tablet, Rfl: 11 ?  valACYclovir (VALTREX) 1000 MG tablet, Take 2 tablets (2,000 mg total) by mouth 2 (two) times daily., Disp: 30 tablet, Rfl: 1 ?  vitamin B-12 (CYANOCOBALAMIN) 500 MCG tablet, Take 500 mcg by mouth daily., Disp: , Rfl:  ?  Vitamins-Lipotropics (B-100 CR PO), Take by mouth., Disp: , Rfl:  ?  Wheat Dextrin (BENEFIBER) POWD, Use as directed BID, Disp: , Rfl: 0 ?  Zinc 30 MG CAPS, Take by mouth., Disp: , Rfl:  ? ?EXAM: ?BP Readings from Last 3 Encounters:  ?11/10/21 126/78  ?10/27/21 112/64  ?10/27/21 112/64  ? ? ?VITALS per patient if applicable: ? ?GENERAL: alert, oriented, appears well and in no acute distress ? ?HEENT: atraumatic, conjunttiva clear, no obvious abnormalities on inspection of external nose and ears ? ?NECK: normal movements of the head and neck ? ?LUNGS: on inspection no signs of respiratory distress, breathing rate appears normal, no obvious gross SOB, gasping or wheezing ? ?CV: no obvious cyanosis ? ?MS: moves all visible  extremities without noticeable abnormality ? ?PSYCH/NEURO: pleasant and cooperative, no obvious depression or anxiety, speech and thought processing grossly intact ?Lab Results  ?Component Value Date  ? WBC 10.3 10/16/2021  ? HGB 12.4 10/16/2021  ? HCT 38.3 10/16/2021  ?  PLT 175 10/16/2021  ? GLUCOSE 194 (H) 10/16/2021  ? CHOL 162 04/08/2021  ? TRIG 185.0 (H) 04/08/2021  ? HDL 49.90 04/08/2021  ? [place] 75 04/08/2021  ? ALT 21 10/15/2021  ? AST 20 10/15/2021  ? NA 137 10/16/2021  ? K 3.9 10/16/2021  ? CL 103 10/16/2021  ? CREATININE 0.74 10/16/2021  ? BUN 7 (L) 10/16/2021  ? CO2 26 10/16/2021  ? TSH 1.85 04/08/2021  ? ? ?ASSESSMENT AND PLAN: ? ?Discussed the following assessment and plan: ? ?  ICD-10-CM   ?1. Respiratory tract congestion with cough  R05.8   ? prob viral poss allergic also  sx rx observe supportive and fu  w peristent of alarm findings   ?  ?2. Right leg swelling  M79.89   ?  ?3. Acute deep vein thrombosis (DVT) of proximal vein of right lower extremity (HCC)  I82.4Y1   ? on anticoagulation   seems stable will check on referral ?    ?  ? ? ?Counseled.  ? Expectant management and discussion of plan and treatment with opportunity to ask questions and all were answered. The patient agreed with the plan and demonstrated an understanding of the instructions. ?  ?Advised to call back or seek an in-person evaluation if worsening  or having  further concerns  in interim. ?No follow-ups on file. ? ? ? ?Shanon Ace, MD  ?

## 2021-11-30 ENCOUNTER — Encounter: Payer: Medicare Other | Admitting: Gastroenterology

## 2021-12-01 ENCOUNTER — Other Ambulatory Visit: Payer: Self-pay | Admitting: Internal Medicine

## 2021-12-01 NOTE — Progress Notes (Signed)
Reviewed and agree with documentation and assessment and plan. K. Veena Taelyn Broecker , MD   

## 2021-12-04 ENCOUNTER — Ambulatory Visit: Payer: Medicare Other | Admitting: Rheumatology

## 2021-12-09 ENCOUNTER — Ambulatory Visit: Payer: Medicare Other | Admitting: Vascular Surgery

## 2021-12-09 ENCOUNTER — Encounter: Payer: Self-pay | Admitting: Vascular Surgery

## 2021-12-09 VITALS — BP 116/78 | HR 67 | Temp 97.2°F | Resp 20 | Ht 65.0 in | Wt 171.8 lb

## 2021-12-09 DIAGNOSIS — I82411 Acute embolism and thrombosis of right femoral vein: Secondary | ICD-10-CM

## 2021-12-09 NOTE — Progress Notes (Signed)
? ?Patient ID: Michele Monroe, female   DOB: [birth year], 76 y.o.   MRN: [id number] ? ?Reason for Consult: Follow-up ?  ?Referred by Burnis Medin, MD ? ?Subjective:  ?   ?HPI: ? ?Michele Monroe is a 76 y.o. female without previous history of blood clots.  She was previously evaluated here for left lower extremity venous reflux she did have a history of a ablation or stripping greater than 20 years ago.  Most recently she developed extensive right lower extremity DVT from the femoral vein down.  She does not have any family history of DVT.  She states that she is up-to-date on her mammogram and colonoscopy.  She is now on anticoagulation and her swelling is much improved.  She is here today today to discuss further options moving forward. ? ?Past Medical History:  ?Diagnosis Date  ? Adenomatous colon polyp   ? ALLERGIC RHINITIS 06/30/2010  ? perennial  ? Allergy   ? seasonal allergies  ? Anxiety   ? hx of  ? Arthritis   ? bilateral hands (R>L)  ? Coughing up blood 08/13/2012  ? piece of tissue? will send to path poss from upper airway sinusitis  Final path was "abscess"   ? Depression   ? hx of  ? GERD 06/30/2010  ? on meds  ? Headache(784.0) 06/30/2010  ? migraines  ? Hepatic steatosis   ? HYPERLIPIDEMIA 06/30/2010  ? on meds  ? HYPERTENSION 06/30/2010  ? on meds  ? IBS 06/30/2010  ? Internal hemorrhoids   ? Renal lesion   ? Varicose veins   ? VITAMIN D DEFICIENCY 06/30/2010  ? ?Family History  ?Problem Relation Age of Onset  ? Colon cancer Mother   ?     age 75  ? Colon polyps Mother   ? Breast cancer Cousin   ? Arthritis Brother   ? Other Brother   ? Colon polyps Brother   ? Esophageal cancer Neg Hx   ? Stomach cancer Neg Hx   ? Rectal cancer Neg Hx   ? ?Past Surgical History:  ?Procedure Laterality Date  ? BREAST CYST ASPIRATION    ? BREAST SURGERY    ? needle bx left breast  ? COLONOSCOPY  2016  ? KN-prep good-TA  ? feet    ? hammer toe sx  ? POLYPECTOMY  2016  ? TA  ? ROTATOR CUFF REPAIR Right   ? varicose veins     ? ? ?Short Social History:  ?Social History  ? ?Tobacco Use  ? Smoking status: Former  ?  Types: Cigarettes  ?  Quit date: 09/06/1976  ?  Years since quitting: 45.2  ? Smokeless tobacco: Never  ?Substance Use Topics  ? Alcohol use: No  ?  Alcohol/week: 0.0 standard drinks  ?  Comment: rarely  ? ? ?No Known Allergies ? ?Current Outpatient Medications  ?Medication Sig Dispense Refill  ? albuterol (VENTOLIN HFA) 108 (90 Base) MCG/ACT inhaler Inhale 2 puffs by mouth every 6 hours as needed. LAST refill pt needs appt. 8.5 each 0  ? apixaban (ELIQUIS) 5 MG TABS tablet Take 1 tablet (5 mg total) by mouth 2 (two) times daily. 60 tablet 3  ? Ascorbic Acid (VITAMIN C) 1000 MG tablet Take 1,000 mg by mouth daily.    ? atenolol (TENORMIN) 25 MG tablet Take 1 tablet (25 mg total) by mouth daily. 90 tablet 1  ? atorvastatin (LIPITOR) 20 MG tablet Take 1 tablet by mouth every  day 30 tablet 11  ? carbamide peroxide (DEBROX) 6.5 % OTIC solution Place 5 drops into both ears 2 (two) times daily. 15 mL 0  ? cholecalciferol (VITAMIN D3) 25 MCG (1000 UNIT) tablet Take 1,000 Units by mouth daily.    ? ondansetron (ZOFRAN-ODT) 4 MG disintegrating tablet TAKE 1 TABLET BY MOUTH EVERY 8 HOURS AS NEEDED FOR NAUSEA AND VOMITING 30 tablet 0  ? pantoprazole (PROTONIX) 40 MG tablet Take 1 tablet (40 mg total) by mouth daily. 30 tablet 3  ? polyethylene glycol (MIRALAX / GLYCOLAX) 17 g packet Take 17 g by mouth 2 (two) times daily. 30 packet 3  ? Probiotic Product (PROBIOTIC ACIDOPHILUS BEADS PO) Take by mouth.    ? rizatriptan (MAXALT) 10 MG tablet Take 1 tablet at the onset of migraine, may repeat in 2 hours. Max of 2 doses per day 11 tablet 0  ? senna-docusate (SENOKOT S) 8.6-50 MG tablet Take 1 tablet by mouth at bedtime as needed for mild constipation.    ? tiZANidine (ZANAFLEX) 4 MG tablet Take one-half to one tablet by mouth at bedtime as needed for neck pain 30 tablet 11  ? valACYclovir (VALTREX) 1000 MG tablet Take 2 tablets (2,000 mg  total) by mouth 2 (two) times daily. 30 tablet 1  ? vitamin B-12 (CYANOCOBALAMIN) 500 MCG tablet Take 500 mcg by mouth daily.    ? Vitamins-Lipotropics (B-100 CR PO) Take by mouth.    ? Wheat Dextrin (BENEFIBER) POWD Use as directed BID  0  ? Zinc 30 MG CAPS Take by mouth.    ? benzonatate (TESSALON PERLES) 100 MG capsule Take 1-2 capsules (100-200 mg total) by mouth 2 (two) times daily as needed for cough. (Patient not taking: Reported on 12/09/2021) 21 capsule 1  ? ?No current facility-administered medications for this visit.  ? ? ?Review of Systems  ?Constitutional:  Constitutional negative. ?HENT: HENT negative.  ?Eyes: Eyes negative.  ?Respiratory: Respiratory negative.  ?Cardiovascular: Positive for leg swelling.  ?GI: Gastrointestinal negative.  ?Musculoskeletal: Musculoskeletal negative.  ?Skin: Skin negative.  ?Neurological: Neurological negative. ?Hematologic: Hematologic/lymphatic negative.  ?Psychiatric: Psychiatric negative.   ? ?   ?Objective:  ?Objective  ? ?Vitals:  ? 12/09/21 1035  ?BP: 116/78  ?Pulse: 67  ?Resp: 20  ?Temp: (!) 97.2 ?F (36.2 ?C)  ?SpO2: 94%  ?Weight: 171 lb 12.8 oz (77.9 kg)  ?Height: '5\' 5"'$  (1.651 m)  ? ?Body mass index is 28.59 kg/m?. ? ?Physical Exam ?HENT:  ?   Head: Normocephalic.  ?   Nose: Nose normal.  ?   Mouth/Throat:  ?   Mouth: Mucous membranes are moist.  ?Eyes:  ?   Pupils: Pupils are equal, round, and reactive to light.  ?Cardiovascular:  ?   Rate and Rhythm: Normal rate.  ?Pulmonary:  ?   Effort: Pulmonary effort is normal.  ?Abdominal:  ?   General: Abdomen is flat.  ?   Palpations: Abdomen is soft.  ?Musculoskeletal:     ?   General: Normal range of motion.  ?   Cervical back: Normal range of motion and neck supple.  ?   Right lower leg: Edema present.  ?   Left lower leg: No edema.  ?Skin: ?   General: Skin is warm.  ?   Capillary Refill: Capillary refill takes less than 2 seconds.  ?Neurological:  ?   General: No focal deficit present.  ?   Mental Status: She is  alert.  ?Psychiatric:     ?  Mood and Affect: Mood normal.     ?   Behavior: Behavior normal.     ?   Thought Content: Thought content normal.     ?   Judgment: Judgment normal.  ? ? ?Data: ?FINDINGS: ?Contralateral Common Femoral Vein: Respiratory phasicity is normal ?and symmetric with the symptomatic side. No evidence of thrombus. ?Normal compressibility. ?  ?Common Femoral Vein: No evidence of thrombus. Normal ?compressibility, respiratory phasicity and response to augmentation. ?  ?Saphenofemoral Junction: No evidence of thrombus. Normal ?compressibility and flow on color Doppler imaging. ?  ?Profunda Femoral Vein: No evidence of thrombus. Normal ?compressibility and flow on color Doppler imaging. ?  ?Femoral Vein: Mixed echogenicity intraluminal thrombus. Vessel is ?noncompressible. Thrombus appears occlusive throughout the femoral ?vein. ?  ?Popliteal Vein: Mixed echogenicity intraluminal thrombus appearing ?occlusive. Vessel noncompressible. ?  ?Calf Veins: Thrombus does appear to extend into the tibial and ?peroneal veins which are also noncompressible. ?  ?IMPRESSION: ?Positive exam for right lower extremity occlusive appearing ?femoropopliteal DVT extending into the calf veins. ?  ?    ?Assessment/Plan:  ?  ? ?76 year old female follows up for evaluation of extensive right lower extremity DVT that did not include the common femoral vein.  Most of her swelling is resolved she probably has about 2 to 3% swelling residual does not need any intervention from the standpoint.  I discussed with her the need for compression socks as well as elevating her legs when recumbent and the need to continue exercise mostly walking and the benefits of water aerobics as well.  From a purely DVT standpoint she would require anticoagulation for 3 months given the extent of the DVT I think 6 months is certainly reasonable.  At that time she should transition to baby aspirin daily as long as she can tolerate.  She does not  need a repeat DVT ultrasound as this will only show chronic thrombus and it would not guide any further therapies.  She can see me on an as-needed basis. ? ?  ? ?Waynetta Sandy MD ?[facility]

## 2021-12-15 ENCOUNTER — Other Ambulatory Visit: Payer: Self-pay | Admitting: Internal Medicine

## 2021-12-21 ENCOUNTER — Encounter: Payer: Self-pay | Admitting: Family Medicine

## 2021-12-21 ENCOUNTER — Telehealth (INDEPENDENT_AMBULATORY_CARE_PROVIDER_SITE_OTHER): Payer: Medicare Other | Admitting: Family Medicine

## 2021-12-21 DIAGNOSIS — R519 Headache, unspecified: Secondary | ICD-10-CM | POA: Diagnosis not present

## 2021-12-21 DIAGNOSIS — K529 Noninfective gastroenteritis and colitis, unspecified: Secondary | ICD-10-CM | POA: Diagnosis not present

## 2021-12-21 NOTE — Progress Notes (Signed)
Virtual Visit via Video Note ? ?I connected with Michele Monroe on 12/21/21 at 11:15 AM EDT by a video enabled telemedicine application 2/2 [id number] pandemic and verified that I am speaking with the correct person using two identifiers. ? Location patient: home ?Location provider:work or home office ?Persons participating in the virtual visit: patient, provider ? ?I discussed the limitations of evaluation and management by telemedicine and the availability of in person appointments. The patient expressed understanding and agreed to proceed. ? ? ?HPI: ?Pt states Friday night she developed a migraine HA.  The next day had emesis x2 and a HA similar to her migraines.  Endorses continued HA over the weekend.  Headache started in back of head but moved to temples and forehead.  Took Maxalt.  Patient has prescription for Zofran which she takes in the mornings.     Also endorses abd pain/cramping causing diaphoresis, and loose stools- stopped yesterday.  Pt notes h/o constipation, on MiraLAX.   Last loose stool was Sat., at the time noticed blood on tissue when wiped.  On Eliquis for h/o DVT.  Denies sick contacts fever, sore throat, hemorrhoids.  ? ? ?ROS: See pertinent positives and negatives per HPI. ? ?Past Medical History:  ?Diagnosis Date  ? Adenomatous colon polyp   ? ALLERGIC RHINITIS 06/30/2010  ? perennial  ? Allergy   ? seasonal allergies  ? Anxiety   ? hx of  ? Arthritis   ? bilateral hands (R>L)  ? Coughing up blood 08/13/2012  ? piece of tissue? will send to path poss from upper airway sinusitis  Final path was "abscess"   ? Depression   ? hx of  ? GERD 06/30/2010  ? on meds  ? Headache(784.0) 06/30/2010  ? migraines  ? Hepatic steatosis   ? HYPERLIPIDEMIA 06/30/2010  ? on meds  ? HYPERTENSION 06/30/2010  ? on meds  ? IBS 06/30/2010  ? Internal hemorrhoids   ? Renal lesion   ? Varicose veins   ? VITAMIN D DEFICIENCY 06/30/2010  ? ? ?Past Surgical History:  ?Procedure Laterality Date  ? BREAST CYST ASPIRATION    ?  BREAST SURGERY    ? needle bx left breast  ? COLONOSCOPY  2016  ? KN-prep good-TA  ? feet    ? hammer toe sx  ? POLYPECTOMY  2016  ? TA  ? ROTATOR CUFF REPAIR Right   ? varicose veins    ? ? ?Family History  ?Problem Relation Age of Onset  ? Colon cancer Mother   ?     age 61  ? Colon polyps Mother   ? Breast cancer Cousin   ? Arthritis Brother   ? Other Brother   ? Colon polyps Brother   ? Esophageal cancer Neg Hx   ? Stomach cancer Neg Hx   ? Rectal cancer Neg Hx   ? ? ? ?Current Outpatient Medications:  ?  albuterol (VENTOLIN HFA) 108 (90 Base) MCG/ACT inhaler, Inhale 2 puffs by mouth every 6 hours as needed. LAST refill pt needs appt., Disp: 8.5 each, Rfl: 0 ?  apixaban (ELIQUIS) 5 MG TABS tablet, Take 1 tablet (5 mg total) by mouth 2 (two) times daily., Disp: 60 tablet, Rfl: 3 ?  Ascorbic Acid (VITAMIN C) 1000 MG tablet, Take 1,000 mg by mouth daily., Disp: , Rfl:  ?  atenolol (TENORMIN) 25 MG tablet, Take 1 tablet (25 mg total) by mouth daily., Disp: 90 tablet, Rfl: 1 ?  atorvastatin (LIPITOR) 20 MG  tablet, Take 1 tablet by mouth every day, Disp: 30 tablet, Rfl: 11 ?  carbamide peroxide (DEBROX) 6.5 % OTIC solution, Place 5 drops into both ears 2 (two) times daily., Disp: 15 mL, Rfl: 0 ?  cholecalciferol (VITAMIN D3) 25 MCG (1000 UNIT) tablet, Take 1,000 Units by mouth daily., Disp: , Rfl:  ?  ondansetron (ZOFRAN-ODT) 4 MG disintegrating tablet, TAKE 1 TABLET BY MOUTH EVERY 8 HOURS AS NEEDED FOR NAUSEA AND VOMITING, Disp: 30 tablet, Rfl: 0 ?  pantoprazole (PROTONIX) 40 MG tablet, Take 1 tablet (40 mg total) by mouth daily., Disp: 30 tablet, Rfl: 3 ?  polyethylene glycol (MIRALAX / GLYCOLAX) 17 g packet, Take 17 g by mouth 2 (two) times daily., Disp: 30 packet, Rfl: 3 ?  Probiotic Product (PROBIOTIC ACIDOPHILUS BEADS PO), Take by mouth., Disp: , Rfl:  ?  rizatriptan (MAXALT) 10 MG tablet, Take 1 tablet at the onset of migraine, may repeat in 2 hours. Max of 2 doses per day, Disp: 11 tablet, Rfl: 0 ?   senna-docusate (SENOKOT S) 8.6-50 MG tablet, Take 1 tablet by mouth at bedtime as needed for mild constipation., Disp: , Rfl:  ?  tiZANidine (ZANAFLEX) 4 MG tablet, Take one-half to one tablet by mouth at bedtime as needed for neck pain, Disp: 30 tablet, Rfl: 11 ?  valACYclovir (VALTREX) 1000 MG tablet, Take 2 tablets (2,000 mg total) by mouth 2 (two) times daily., Disp: 30 tablet, Rfl: 1 ?  vitamin B-12 (CYANOCOBALAMIN) 500 MCG tablet, Take 500 mcg by mouth daily., Disp: , Rfl:  ?  Vitamins-Lipotropics (B-100 CR PO), Take by mouth., Disp: , Rfl:  ?  Wheat Dextrin (BENEFIBER) POWD, Use as directed BID, Disp: , Rfl: 0 ?  Zinc 30 MG CAPS, Take by mouth., Disp: , Rfl:  ?  benzonatate (TESSALON PERLES) 100 MG capsule, Take 1-2 capsules (100-200 mg total) by mouth 2 (two) times daily as needed for cough. (Patient not taking: Reported on 12/09/2021), Disp: 21 capsule, Rfl: 1 ? ?EXAM: ? ?VITALS per patient if applicable: RR between 42-70 bpm ? ?GENERAL: alert, oriented, appears well and in no acute distress ? ?HEENT: atraumatic, conjunctiva clear, no obvious abnormalities on inspection of external nose and ears ? ?NECK: normal movements of the head and neck ? ?LUNGS: on inspection no signs of respiratory distress, breathing rate appears normal, no obvious gross SOB, gasping or wheezing ? ?CV: no obvious cyanosis ? ?MS: moves all visible extremities without noticeable abnormality ? ?PSYCH/NEURO: pleasant and cooperative, no obvious depression or anxiety, speech and thought processing grossly intact ? ?ASSESSMENT AND PLAN: ? ?Discussed the following assessment and plan: ? ?Gastroenteritis ?-Improving ?-Discussed symptoms likely viral in etiology given concurrent nausea/vomiting.  Also consider loose stools due to history of constipation with recent MiraLAX use. ?-Patient encouraged to take a home COVID test to rule out COVID as cause of symptoms. ?-Continue expectant management with OTC medications as needed such as  Imodium. ?-Patient encouraged to take Zofran as needed.  Has prescription already. advised okay to take a second dose after 8 hours if needed. ?-Bland diet, advance as tolerated.  Patient also encouraged to take small sips of fluids. ?-For continued loose stools despite stopping MiraLAX use consider impaction. ?-Given strict precautions. ? ?Acute nonintractable headache, unspecified headache type ?-Symptoms possibly 2/2 current gastroenteritis versus patient's migraines ?-Continue hydration, Maxalt as needed and Zofran. ? ? ?Follow-up as needed for continued or worsening symptoms. ?  ?I discussed the assessment and treatment plan with the patient.  The patient was provided an opportunity to ask questions and all were answered. The patient agreed with the plan and demonstrated an understanding of the instructions. ?  ?The patient was advised to call back or seek an in-person evaluation if the symptoms worsen or if the condition fails to improve as anticipated. ? ? ?Billie Ruddy, MD  ? ?

## 2021-12-24 ENCOUNTER — Encounter: Payer: Self-pay | Admitting: Internal Medicine

## 2021-12-25 ENCOUNTER — Ambulatory Visit: Payer: Medicare Other | Admitting: Rheumatology

## 2021-12-25 DIAGNOSIS — Z8719 Personal history of other diseases of the digestive system: Secondary | ICD-10-CM

## 2021-12-25 DIAGNOSIS — M2550 Pain in unspecified joint: Secondary | ICD-10-CM

## 2021-12-25 DIAGNOSIS — E559 Vitamin D deficiency, unspecified: Secondary | ICD-10-CM

## 2021-12-25 DIAGNOSIS — Z8639 Personal history of other endocrine, nutritional and metabolic disease: Secondary | ICD-10-CM

## 2021-12-25 DIAGNOSIS — I83893 Varicose veins of bilateral lower extremities with other complications: Secondary | ICD-10-CM

## 2021-12-25 DIAGNOSIS — R232 Flushing: Secondary | ICD-10-CM

## 2021-12-25 DIAGNOSIS — I10 Essential (primary) hypertension: Secondary | ICD-10-CM

## 2022-01-01 ENCOUNTER — Telehealth: Payer: Self-pay | Admitting: Internal Medicine

## 2022-01-01 ENCOUNTER — Telehealth: Payer: Medicare Other | Admitting: Family Medicine

## 2022-01-01 NOTE — Telephone Encounter (Signed)
Pt called back and stated her neighbor got her some eye drops and they helped, wanted to just cancel the appointment, spoke with Margarette. ?

## 2022-01-01 NOTE — Telephone Encounter (Signed)
Pt states she was waiting over 1.5 hour and no one came on virtual . Please advise ?

## 2022-01-03 ENCOUNTER — Other Ambulatory Visit: Payer: Self-pay | Admitting: Internal Medicine

## 2022-01-03 NOTE — Telephone Encounter (Signed)
So 3-6 months ,if doing well ,is time frame   to stay on blood thinner .   Less chance of recurrence if stay on blood thinners longer .   I suggest  6 months total ,which would be June   1st or thereabouts  . Suggest make appt with me if  needed to discuss this  .

## 2022-01-07 ENCOUNTER — Other Ambulatory Visit: Payer: Self-pay | Admitting: Neurology

## 2022-01-07 ENCOUNTER — Encounter: Payer: Self-pay | Admitting: Family Medicine

## 2022-01-07 DIAGNOSIS — G43809 Other migraine, not intractable, without status migrainosus: Secondary | ICD-10-CM

## 2022-01-07 NOTE — Progress Notes (Signed)
Error.  Unable to connect with patient and initial time of visit.  Patient rescheduled for later the same day however prior to start of appointment decided to try OTC meds for symptoms instead. ?

## 2022-01-08 MED ORDER — RIZATRIPTAN BENZOATE 10 MG PO TABS: 10.0000 mg | ORAL_TABLET | ORAL | 5 refills | Status: DC | PRN

## 2022-01-13 ENCOUNTER — Ambulatory Visit: Payer: Medicare Other | Admitting: Rheumatology

## 2022-01-13 DIAGNOSIS — M9905 Segmental and somatic dysfunction of pelvic region: Secondary | ICD-10-CM | POA: Diagnosis not present

## 2022-01-13 DIAGNOSIS — M9903 Segmental and somatic dysfunction of lumbar region: Secondary | ICD-10-CM | POA: Diagnosis not present

## 2022-01-13 DIAGNOSIS — M9901 Segmental and somatic dysfunction of cervical region: Secondary | ICD-10-CM | POA: Diagnosis not present

## 2022-01-13 DIAGNOSIS — M9904 Segmental and somatic dysfunction of sacral region: Secondary | ICD-10-CM | POA: Diagnosis not present

## 2022-01-14 DIAGNOSIS — M9903 Segmental and somatic dysfunction of lumbar region: Secondary | ICD-10-CM | POA: Diagnosis not present

## 2022-01-14 DIAGNOSIS — M9905 Segmental and somatic dysfunction of pelvic region: Secondary | ICD-10-CM | POA: Diagnosis not present

## 2022-01-14 DIAGNOSIS — M9901 Segmental and somatic dysfunction of cervical region: Secondary | ICD-10-CM | POA: Diagnosis not present

## 2022-01-14 DIAGNOSIS — M9904 Segmental and somatic dysfunction of sacral region: Secondary | ICD-10-CM | POA: Diagnosis not present

## 2022-01-15 DIAGNOSIS — M9903 Segmental and somatic dysfunction of lumbar region: Secondary | ICD-10-CM | POA: Diagnosis not present

## 2022-01-15 DIAGNOSIS — M9905 Segmental and somatic dysfunction of pelvic region: Secondary | ICD-10-CM | POA: Diagnosis not present

## 2022-01-15 DIAGNOSIS — M9904 Segmental and somatic dysfunction of sacral region: Secondary | ICD-10-CM | POA: Diagnosis not present

## 2022-01-15 DIAGNOSIS — M9901 Segmental and somatic dysfunction of cervical region: Secondary | ICD-10-CM | POA: Diagnosis not present

## 2022-01-18 DIAGNOSIS — M9904 Segmental and somatic dysfunction of sacral region: Secondary | ICD-10-CM | POA: Diagnosis not present

## 2022-01-18 DIAGNOSIS — M9905 Segmental and somatic dysfunction of pelvic region: Secondary | ICD-10-CM | POA: Diagnosis not present

## 2022-01-18 DIAGNOSIS — M9901 Segmental and somatic dysfunction of cervical region: Secondary | ICD-10-CM | POA: Diagnosis not present

## 2022-01-18 DIAGNOSIS — M9903 Segmental and somatic dysfunction of lumbar region: Secondary | ICD-10-CM | POA: Diagnosis not present

## 2022-01-19 ENCOUNTER — Encounter: Payer: Self-pay | Admitting: Gastroenterology

## 2022-01-19 ENCOUNTER — Ambulatory Visit: Payer: Medicare Other | Admitting: Gastroenterology

## 2022-01-19 VITALS — BP 124/70 | HR 65 | Ht 65.0 in | Wt 170.0 lb

## 2022-01-19 DIAGNOSIS — M9901 Segmental and somatic dysfunction of cervical region: Secondary | ICD-10-CM | POA: Diagnosis not present

## 2022-01-19 DIAGNOSIS — M9905 Segmental and somatic dysfunction of pelvic region: Secondary | ICD-10-CM | POA: Diagnosis not present

## 2022-01-19 DIAGNOSIS — K219 Gastro-esophageal reflux disease without esophagitis: Secondary | ICD-10-CM | POA: Diagnosis not present

## 2022-01-19 DIAGNOSIS — Z800 Family history of malignant neoplasm of digestive organs: Secondary | ICD-10-CM | POA: Diagnosis not present

## 2022-01-19 DIAGNOSIS — K581 Irritable bowel syndrome with constipation: Secondary | ICD-10-CM

## 2022-01-19 DIAGNOSIS — M9904 Segmental and somatic dysfunction of sacral region: Secondary | ICD-10-CM | POA: Diagnosis not present

## 2022-01-19 DIAGNOSIS — M9903 Segmental and somatic dysfunction of lumbar region: Secondary | ICD-10-CM | POA: Diagnosis not present

## 2022-01-19 MED ORDER — HYOSCYAMINE SULFATE 0.125 MG SL SUBL: 0.1250 mg | SUBLINGUAL_TABLET | Freq: Every day | SUBLINGUAL | 1 refills | Status: DC | PRN

## 2022-01-19 NOTE — Patient Instructions (Signed)
Continue omeprazole prescription twice daily.  ? ?We have sent the following medications to your pharmacy for you to pick up at your convenience: Levsin. ? ?Continue Miralax 1-2 capfuls daily.  ? ?Drink at least 60-70 oz of water daily. ? ?

## 2022-01-19 NOTE — Progress Notes (Signed)
? ?       ? ?Michele Monroe    [id number]    1946/05/14 ? ?Primary Care Physician:Panosh, [name], MD ? ?Referring Physician: Burnis Medin, MD ?[address] ?[place],  [place] [address] ? ? ?Chief complaint:  GERD, Constipation ? ?HPI: ? ?76 yr old very pleasant female here for follow up visit for GERD and constipation. Last office visit 11/10/21.  ? ?Overall GERD symptoms are improved on omeprazole twice daily.  Denies any dysphagia or odynophagia.  She has intentionally lost weight, has changed her diet. ?She is continuing to struggle with her bowel habits and has intermittent constipation.  She is taking MiraLAX 1 capful daily ?She is on anticoagulation for history of DVT ?She is seeing a chiropractor and feels that has improved her overall health, shoulder and back pain. ? ?Upper GI series unremarkable ? ?CT abdomen pelvis February 2023 no acute abnormality ? ?Colonoscopy 04/06/21:  ?- One 10 mm polyp at the hepatic flexure, removed with a cold snare. Resected and retrieved. ?- Non-bleeding external and internal hemorrhoids. ? ?Colonoscopy 07/10/2015 ?Cecal polyp removed, hemorrhoids otherwise normal exam ? ? ?Outpatient Encounter Medications as of 01/19/2022  ?Medication Sig  ? albuterol (VENTOLIN HFA) 108 (90 Base) MCG/ACT inhaler Inhale 2 puffs by mouth every 6 hours as needed. LAST refill pt needs appt.  ? apixaban (ELIQUIS) 5 MG TABS tablet Take 1 tablet (5 mg total) by mouth 2 (two) times daily.  ? Ascorbic Acid (VITAMIN C) 1000 MG tablet Take 1,000 mg by mouth daily.  ? atenolol (TENORMIN) 25 MG tablet Take 1 tablet (25 mg total) by mouth daily.  ? atorvastatin (LIPITOR) 20 MG tablet Take 1 tablet by mouth every day  ? benzonatate (TESSALON PERLES) 100 MG capsule Take 1-2 capsules (100-200 mg total) by mouth 2 (two) times daily as needed for cough.  ? carbamide peroxide (DEBROX) 6.5 % OTIC solution Place 5 drops into both ears 2 (two) times daily.  ? cholecalciferol (VITAMIN D3) 25 MCG (1000 UNIT)  tablet Take 1,000 Units by mouth daily.  ? omeprazole (PRILOSEC) 40 MG capsule Take 40 mg by mouth in the morning and at bedtime.  ? ondansetron (ZOFRAN-ODT) 4 MG disintegrating tablet TAKE 1 TABLET BY MOUTH EVERY 8 HOURS AS NEEDED FOR NAUSEA AND VOMITING  ? polyethylene glycol (MIRALAX / GLYCOLAX) 17 g packet Take 17 g by mouth 2 (two) times daily.  ? Probiotic Product (PROBIOTIC ACIDOPHILUS BEADS PO) Take by mouth.  ? rizatriptan (MAXALT) 10 MG tablet Take 1 tablet (10 mg total) by mouth as needed for migraine (May repeat after 2 hours.  Maximum 2 tablets in 24 hours.). May repeat in 2 hours if needed  ? senna-docusate (SENOKOT S) 8.6-50 MG tablet Take 1 tablet by mouth at bedtime as needed for mild constipation.  ? tiZANidine (ZANAFLEX) 4 MG tablet Take one-half to one tablet by mouth at bedtime as needed for neck pain  ? valACYclovir (VALTREX) 1000 MG tablet Take 2 tablets (2,000 mg total) by mouth 2 (two) times daily.  ? vitamin B-12 (CYANOCOBALAMIN) 500 MCG tablet Take 500 mcg by mouth daily.  ? Vitamins-Lipotropics (B-100 CR PO) Take by mouth.  ? Wheat Dextrin (BENEFIBER) POWD Use as directed BID  ? Zinc 30 MG CAPS Take by mouth.  ? [DISCONTINUED] pantoprazole (PROTONIX) 40 MG tablet Take 1 tablet (40 mg total) by mouth daily.  ? ?No facility-administered encounter medications on file as of 01/19/2022.  ? ? ?Allergies as of  01/19/2022  ? (No Known Allergies)  ? ? ?Past Medical History:  ?Diagnosis Date  ? Adenomatous colon polyp   ? ALLERGIC RHINITIS 06/30/2010  ? perennial  ? Allergy   ? seasonal allergies  ? Anxiety   ? hx of  ? Arthritis   ? bilateral hands (R>L)  ? Coughing up blood 08/13/2012  ? piece of tissue? will send to path poss from upper airway sinusitis  Final path was "abscess"   ? Depression   ? hx of  ? GERD 06/30/2010  ? on meds  ? Headache(784.0) 06/30/2010  ? migraines  ? Hepatic steatosis   ? HYPERLIPIDEMIA 06/30/2010  ? on meds  ? HYPERTENSION 06/30/2010  ? on meds  ? IBS 06/30/2010  ?  Internal hemorrhoids   ? Renal lesion   ? Varicose veins   ? VITAMIN D DEFICIENCY 06/30/2010  ? ? ?Past Surgical History:  ?Procedure Laterality Date  ? BREAST CYST ASPIRATION    ? BREAST SURGERY    ? needle bx left breast  ? COLONOSCOPY  2016  ? KN-prep good-TA  ? feet    ? hammer toe sx  ? POLYPECTOMY  2016  ? TA  ? ROTATOR CUFF REPAIR Right   ? varicose veins    ? ? ?Family History  ?Problem Relation Age of Onset  ? Colon cancer Mother   ?     age 9  ? Colon polyps Mother   ? Breast cancer Cousin   ? Arthritis Brother   ? Other Brother   ? Colon polyps Brother   ? Esophageal cancer Neg Hx   ? Stomach cancer Neg Hx   ? Rectal cancer Neg Hx   ? ? ?Social History  ? ?Socioeconomic History  ? Marital status: Widowed  ?  Spouse name: Not on file  ? Number of children: 2  ? Years of education: Not on file  ? Highest education level: Not on file  ?Occupational History  ? Occupation: retired  ?  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 Smoking status: Former  ?  Types: Cigarettes  ?  Quit date: 09/06/1976  ?  Years since quitting: 45.4  ? Smokeless tobacco: Never  ?Vaping Use  ? Vaping Use: Never used  ?Substance and Sexual Activity  ? Alcohol use: No  ?  Alcohol/week: 0.0 standard drinks  ?  Comment: rarely  ? Drug use: No  ? Sexual activity: Not Currently  ?Other Topics Concern  ? Not on file  ?Social History Narrative  ? Widower  Husband died 35  PTSD in his sleep  ? Former smoker in her 20's  ? 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 HH of 1   ?   ? Still working 8 hours a day.   ?   ? Lives in [place]  ?   ? 3 cups of caffinated coffee per day. Denies soda/tea  ? Right handed  ? ?Social Determinants of Health  ? ?Financial Resource Strain: Low Risk   ? Difficulty of Paying Living Expenses: Not hard at all  ?Food Insecurity: No Food Insecurity  ? Worried About Charity fundraiser in the Last Year: Never true  ? Ran Out of Food in the Last Year: Never true  ?Transportation Needs:  No Transportation Needs  ? Lack of Transportation (Medical): No  ? Lack of Transportation (Non-Medical): No  ?Physical Activity: Inactive  ? Days of  Exercise per Week: 0 days  ? Minutes of Exercise per Session: 0 min  ?Stress: Stress Concern Present  ? Feeling of Stress : To some extent  ?Social Connections: Not on file  ?Intimate Partner Violence: Not on file  ? ? ? ? ?Review of systems: ?All other review of systems negative except as mentioned in the HPI. ? ? ?Physical Exam: ?Vitals:  ? 01/19/22 0825  ?BP: 124/70  ?Pulse: 65  ? ?Body mass index is 28.29 kg/m?. ?Gen:      No acute distress ?HEENT:  sclera anicteric ?Abd:      soft, non-tender; no palpable masses, no distension ?Ext:    No edema ?Neuro: alert and oriented x 3 ?Psych: normal mood and affect ? ?Data Reviewed: ? ?Reviewed labs, radiology imaging, old records and pertinent past GI work up ? ? ?Assessment and Plan/Recommendations: ? ?76 year old very pleasant female with family history of colon cancer, GERD and IBS constipation ? ?GERD: Continue omeprazole 40 mg twice daily and antireflux measures for additional 3 months.  If symptoms improve, will plan to taper down the dose to once daily ?We will hold off EGD given her symptoms are improving ? ?IBS constipation: Continue MiraLAX, advised patient to titrate dose based on response to have 1-2 soft bowel movements daily ?Continue with high-fiber diet and increase water intake to 60 to 80 ounces daily ? ?Return in 3 to 4 months ? ?The patient was provided an opportunity to ask questions and all were answered. The patient agreed with the plan and demonstrated an understanding of the instructions. ? ?K. Denzil Magnuson , MD ?  ? ?CC: Panosh, [name], MD ?  ?

## 2022-01-20 DIAGNOSIS — M9901 Segmental and somatic dysfunction of cervical region: Secondary | ICD-10-CM | POA: Diagnosis not present

## 2022-01-20 DIAGNOSIS — M9905 Segmental and somatic dysfunction of pelvic region: Secondary | ICD-10-CM | POA: Diagnosis not present

## 2022-01-20 DIAGNOSIS — M9904 Segmental and somatic dysfunction of sacral region: Secondary | ICD-10-CM | POA: Diagnosis not present

## 2022-01-20 DIAGNOSIS — M9903 Segmental and somatic dysfunction of lumbar region: Secondary | ICD-10-CM | POA: Diagnosis not present

## 2022-01-22 DIAGNOSIS — M9905 Segmental and somatic dysfunction of pelvic region: Secondary | ICD-10-CM | POA: Diagnosis not present

## 2022-01-22 DIAGNOSIS — M9903 Segmental and somatic dysfunction of lumbar region: Secondary | ICD-10-CM | POA: Diagnosis not present

## 2022-01-22 DIAGNOSIS — M9901 Segmental and somatic dysfunction of cervical region: Secondary | ICD-10-CM | POA: Diagnosis not present

## 2022-01-22 DIAGNOSIS — M9904 Segmental and somatic dysfunction of sacral region: Secondary | ICD-10-CM | POA: Diagnosis not present

## 2022-01-25 DIAGNOSIS — M9905 Segmental and somatic dysfunction of pelvic region: Secondary | ICD-10-CM | POA: Diagnosis not present

## 2022-01-25 DIAGNOSIS — M9901 Segmental and somatic dysfunction of cervical region: Secondary | ICD-10-CM | POA: Diagnosis not present

## 2022-01-25 DIAGNOSIS — M9903 Segmental and somatic dysfunction of lumbar region: Secondary | ICD-10-CM | POA: Diagnosis not present

## 2022-01-25 DIAGNOSIS — M9904 Segmental and somatic dysfunction of sacral region: Secondary | ICD-10-CM | POA: Diagnosis not present

## 2022-01-27 DIAGNOSIS — M9903 Segmental and somatic dysfunction of lumbar region: Secondary | ICD-10-CM | POA: Diagnosis not present

## 2022-01-27 DIAGNOSIS — M9904 Segmental and somatic dysfunction of sacral region: Secondary | ICD-10-CM | POA: Diagnosis not present

## 2022-01-27 DIAGNOSIS — M9905 Segmental and somatic dysfunction of pelvic region: Secondary | ICD-10-CM | POA: Diagnosis not present

## 2022-01-27 DIAGNOSIS — M9901 Segmental and somatic dysfunction of cervical region: Secondary | ICD-10-CM | POA: Diagnosis not present

## 2022-01-29 DIAGNOSIS — M9904 Segmental and somatic dysfunction of sacral region: Secondary | ICD-10-CM | POA: Diagnosis not present

## 2022-01-29 DIAGNOSIS — M9905 Segmental and somatic dysfunction of pelvic region: Secondary | ICD-10-CM | POA: Diagnosis not present

## 2022-01-29 DIAGNOSIS — M9903 Segmental and somatic dysfunction of lumbar region: Secondary | ICD-10-CM | POA: Diagnosis not present

## 2022-01-29 DIAGNOSIS — M9901 Segmental and somatic dysfunction of cervical region: Secondary | ICD-10-CM | POA: Diagnosis not present

## 2022-02-02 DIAGNOSIS — M9905 Segmental and somatic dysfunction of pelvic region: Secondary | ICD-10-CM | POA: Diagnosis not present

## 2022-02-02 DIAGNOSIS — M9903 Segmental and somatic dysfunction of lumbar region: Secondary | ICD-10-CM | POA: Diagnosis not present

## 2022-02-02 DIAGNOSIS — M9901 Segmental and somatic dysfunction of cervical region: Secondary | ICD-10-CM | POA: Diagnosis not present

## 2022-02-02 DIAGNOSIS — M9904 Segmental and somatic dysfunction of sacral region: Secondary | ICD-10-CM | POA: Diagnosis not present

## 2022-02-03 DIAGNOSIS — M9903 Segmental and somatic dysfunction of lumbar region: Secondary | ICD-10-CM | POA: Diagnosis not present

## 2022-02-03 DIAGNOSIS — M9904 Segmental and somatic dysfunction of sacral region: Secondary | ICD-10-CM | POA: Diagnosis not present

## 2022-02-03 DIAGNOSIS — M9905 Segmental and somatic dysfunction of pelvic region: Secondary | ICD-10-CM | POA: Diagnosis not present

## 2022-02-03 DIAGNOSIS — M9901 Segmental and somatic dysfunction of cervical region: Secondary | ICD-10-CM | POA: Diagnosis not present

## 2022-02-05 DIAGNOSIS — M9905 Segmental and somatic dysfunction of pelvic region: Secondary | ICD-10-CM | POA: Diagnosis not present

## 2022-02-05 DIAGNOSIS — M9903 Segmental and somatic dysfunction of lumbar region: Secondary | ICD-10-CM | POA: Diagnosis not present

## 2022-02-05 DIAGNOSIS — M9904 Segmental and somatic dysfunction of sacral region: Secondary | ICD-10-CM | POA: Diagnosis not present

## 2022-02-05 DIAGNOSIS — M9901 Segmental and somatic dysfunction of cervical region: Secondary | ICD-10-CM | POA: Diagnosis not present

## 2022-02-08 DIAGNOSIS — M9901 Segmental and somatic dysfunction of cervical region: Secondary | ICD-10-CM | POA: Diagnosis not present

## 2022-02-08 DIAGNOSIS — M9903 Segmental and somatic dysfunction of lumbar region: Secondary | ICD-10-CM | POA: Diagnosis not present

## 2022-02-08 DIAGNOSIS — M9905 Segmental and somatic dysfunction of pelvic region: Secondary | ICD-10-CM | POA: Diagnosis not present

## 2022-02-08 DIAGNOSIS — M9904 Segmental and somatic dysfunction of sacral region: Secondary | ICD-10-CM | POA: Diagnosis not present

## 2022-02-10 ENCOUNTER — Ambulatory Visit: Payer: Medicare Other | Admitting: Neurology

## 2022-02-10 DIAGNOSIS — M9901 Segmental and somatic dysfunction of cervical region: Secondary | ICD-10-CM | POA: Diagnosis not present

## 2022-02-10 DIAGNOSIS — M9905 Segmental and somatic dysfunction of pelvic region: Secondary | ICD-10-CM | POA: Diagnosis not present

## 2022-02-10 DIAGNOSIS — M9903 Segmental and somatic dysfunction of lumbar region: Secondary | ICD-10-CM | POA: Diagnosis not present

## 2022-02-10 DIAGNOSIS — M9904 Segmental and somatic dysfunction of sacral region: Secondary | ICD-10-CM | POA: Diagnosis not present

## 2022-02-13 ENCOUNTER — Other Ambulatory Visit: Payer: Self-pay | Admitting: Gastroenterology

## 2022-02-15 DIAGNOSIS — M9905 Segmental and somatic dysfunction of pelvic region: Secondary | ICD-10-CM | POA: Diagnosis not present

## 2022-02-15 DIAGNOSIS — M9901 Segmental and somatic dysfunction of cervical region: Secondary | ICD-10-CM | POA: Diagnosis not present

## 2022-02-15 DIAGNOSIS — M9904 Segmental and somatic dysfunction of sacral region: Secondary | ICD-10-CM | POA: Diagnosis not present

## 2022-02-15 DIAGNOSIS — M9903 Segmental and somatic dysfunction of lumbar region: Secondary | ICD-10-CM | POA: Diagnosis not present

## 2022-02-17 DIAGNOSIS — M9905 Segmental and somatic dysfunction of pelvic region: Secondary | ICD-10-CM | POA: Diagnosis not present

## 2022-02-17 DIAGNOSIS — M9903 Segmental and somatic dysfunction of lumbar region: Secondary | ICD-10-CM | POA: Diagnosis not present

## 2022-02-17 DIAGNOSIS — M9904 Segmental and somatic dysfunction of sacral region: Secondary | ICD-10-CM | POA: Diagnosis not present

## 2022-02-17 DIAGNOSIS — M9901 Segmental and somatic dysfunction of cervical region: Secondary | ICD-10-CM | POA: Diagnosis not present

## 2022-02-22 ENCOUNTER — Ambulatory Visit: Payer: Medicare Other | Admitting: Podiatry

## 2022-02-22 DIAGNOSIS — M9905 Segmental and somatic dysfunction of pelvic region: Secondary | ICD-10-CM | POA: Diagnosis not present

## 2022-02-22 DIAGNOSIS — M9903 Segmental and somatic dysfunction of lumbar region: Secondary | ICD-10-CM | POA: Diagnosis not present

## 2022-02-22 DIAGNOSIS — M9904 Segmental and somatic dysfunction of sacral region: Secondary | ICD-10-CM | POA: Diagnosis not present

## 2022-02-22 DIAGNOSIS — M9901 Segmental and somatic dysfunction of cervical region: Secondary | ICD-10-CM | POA: Diagnosis not present

## 2022-02-24 DIAGNOSIS — M9901 Segmental and somatic dysfunction of cervical region: Secondary | ICD-10-CM | POA: Diagnosis not present

## 2022-02-24 DIAGNOSIS — M9903 Segmental and somatic dysfunction of lumbar region: Secondary | ICD-10-CM | POA: Diagnosis not present

## 2022-02-24 DIAGNOSIS — M9905 Segmental and somatic dysfunction of pelvic region: Secondary | ICD-10-CM | POA: Diagnosis not present

## 2022-02-24 DIAGNOSIS — M9904 Segmental and somatic dysfunction of sacral region: Secondary | ICD-10-CM | POA: Diagnosis not present

## 2022-03-01 DIAGNOSIS — M9905 Segmental and somatic dysfunction of pelvic region: Secondary | ICD-10-CM | POA: Diagnosis not present

## 2022-03-01 DIAGNOSIS — M9904 Segmental and somatic dysfunction of sacral region: Secondary | ICD-10-CM | POA: Diagnosis not present

## 2022-03-01 DIAGNOSIS — M9901 Segmental and somatic dysfunction of cervical region: Secondary | ICD-10-CM | POA: Diagnosis not present

## 2022-03-01 DIAGNOSIS — M9903 Segmental and somatic dysfunction of lumbar region: Secondary | ICD-10-CM | POA: Diagnosis not present

## 2022-03-02 ENCOUNTER — Encounter (INDEPENDENT_AMBULATORY_CARE_PROVIDER_SITE_OTHER): Payer: Self-pay | Admitting: Podiatry

## 2022-03-02 DIAGNOSIS — M2041 Other hammer toe(s) (acquired), right foot: Secondary | ICD-10-CM

## 2022-03-02 NOTE — Progress Notes (Signed)
NO SHOW. This encounter was created in error - please disregard.

## 2022-03-07 ENCOUNTER — Other Ambulatory Visit: Payer: Self-pay | Admitting: Internal Medicine

## 2022-03-10 ENCOUNTER — Ambulatory Visit (INDEPENDENT_AMBULATORY_CARE_PROVIDER_SITE_OTHER): Payer: Medicare Other | Admitting: Internal Medicine

## 2022-03-10 ENCOUNTER — Encounter: Payer: Self-pay | Admitting: Internal Medicine

## 2022-03-10 VITALS — BP 126/80 | HR 68 | Temp 98.6°F | Ht 65.0 in | Wt 170.8 lb

## 2022-03-10 DIAGNOSIS — Z79899 Other long term (current) drug therapy: Secondary | ICD-10-CM | POA: Diagnosis not present

## 2022-03-10 DIAGNOSIS — Z7901 Long term (current) use of anticoagulants: Secondary | ICD-10-CM

## 2022-03-10 DIAGNOSIS — R6889 Other general symptoms and signs: Secondary | ICD-10-CM

## 2022-03-10 DIAGNOSIS — R519 Headache, unspecified: Secondary | ICD-10-CM | POA: Diagnosis not present

## 2022-03-10 MED ORDER — ONDANSETRON 4 MG PO TBDP: ORAL_TABLET | ORAL | 0 refills | Status: DC

## 2022-03-10 NOTE — Progress Notes (Signed)
Chief Complaint  Patient presents with   Memory Loss    Noticed this year     HPI: Michele Monroe 76 y.o. come in for a number of concern  Concern about forgetfulness .  Wonders if there are medicines or supplements would be helpful Cant find some t hings.  Walking to a different room forget what she was looking for or where she put stuff. Uses GPS does not get lost able to do bills family is not concerned about her memory. Under   chiro care .  For her neck and seems to have helped her headaches. Tizanidine .  Will taking at night 4 mg.  States that she may be needs this to sleep Has follow-up with neurology this month.  Weight loss has helped the energy.  Still would like a refill of Zofran if needed endoscopy was put on hold because she is on anticoagulation no bleeding.  Actually feels better with the weight loss  Legs are now symmetrical and not swollen.  6 mos dvt  dx 2 2023  [facility] vascular specialist saw her and felt no follow-up needed but 3 to 6 months of anticoagulation was reasonable Probiotic and prebiotic  ROS: See pertinent positives and negatives per HPI.  No head injury has itchy eyes using Pataday drops no antihistamine at this point but has used Zyrtec in the past.  Past Medical History:  Diagnosis Date   Adenomatous colon polyp    ALLERGIC RHINITIS 06/30/2010   perennial   Allergy    seasonal allergies   Anxiety    hx of   Arthritis    bilateral hands (R>L)   Coughing up blood 08/13/2012   piece of tissue? will send to path poss from upper airway sinusitis  Final path was "abscess"    Depression    hx of   GERD 06/30/2010   on meds   Headache(784.0) 06/30/2010   migraines   Hepatic steatosis    HYPERLIPIDEMIA 06/30/2010   on meds   HYPERTENSION 06/30/2010   on meds   IBS 06/30/2010   Internal hemorrhoids    Renal lesion    Varicose veins    VITAMIN D DEFICIENCY 06/30/2010    Family History  Problem Relation Age of Onset   Colon cancer Mother         age 37   Colon polyps Mother    Breast cancer Cousin    Arthritis Brother    Other Brother    Colon polyps Brother    Esophageal cancer Neg Hx    Stomach cancer Neg Hx    Rectal cancer Neg Hx     Social History   Socioeconomic History   Marital status: Widowed    Spouse name: Not on file   Number of children: 2   Years of education: Not on file   Highest education level: Not on file  Occupational History   Occupation: retired    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       Lives in [place]      3 cups of caffinated coffee per day. Denies soda/tea   Right handed   Social Determinants of Health   Financial Resource Strain: Low Risk  (10/27/2021)   Overall Financial Resource Strain (CARDIA)    Difficulty of Paying Living Expenses: Not hard at all  Food Insecurity: No Food Insecurity (10/27/2021)   Hunger Vital Sign    Worried About Running Out of Food in the Last Year: Never true    Ran Out of Food in the Last Year: Never true  Transportation Needs: No Transportation Needs (10/27/2021)   PRAPARE - Hydrologist (Medical): No    Lack of Transportation (Non-Medical): No  Physical Activity: Inactive (10/27/2021)   Exercise Vital Sign    Days of Exercise per Week: 0 days    Minutes of Exercise per Session: 0 min  Stress: Stress Concern Present  (10/27/2021)   [place]    Feeling of Stress : To some extent  Social Connections: Moderately Isolated (10/21/2020)   Social Connection and Isolation Panel [NHANES]    Frequency of Communication with Friends and Family: More than three times a week    Frequency of Social Gatherings with Friends and Family: More than three times a week    Attends Religious Services: 1 to 4 times per year    Active Member of [employer] or Organizations: No    Attends Archivist Meetings: Never    Marital Status: Widowed    Outpatient Medications Prior to Visit  Medication Sig Dispense Refill   albuterol (VENTOLIN HFA) 108 (90 Base) MCG/ACT inhaler Inhale 2 puffs by mouth every 6 hours as needed. LAST refill pt needs appt. 8.5 each 0   Ascorbic Acid (VITAMIN C) 1000 MG tablet Take 1,000 mg by mouth daily.     atenolol (TENORMIN) 25 MG tablet Take 1 tablet (25 mg total) by mouth daily. 90 tablet 1   atorvastatin (LIPITOR) 20 MG tablet Take 1 tablet by mouth every day 30 tablet 11   carbamide peroxide (DEBROX) 6.5 % OTIC solution Place 5 drops into both ears 2 (two) times daily. 15 mL 0   cholecalciferol (VITAMIN D3) 25 MCG (1000 UNIT) tablet Take 1,000 Units by mouth daily.     ELIQUIS 5 MG TABS tablet TAKE 1 TABLET BY MOUTH TWICE A DAY 60 tablet 3   hyoscyamine (LEVSIN SL) 0.125 MG SL tablet PLACE 1 TABLET UNDER THE TONGUE DAILY AS NEEDED. 30 tablet 1   omeprazole (PRILOSEC) 40 MG capsule Take 40 mg by mouth in the morning and at bedtime.     polyethylene glycol (MIRALAX / GLYCOLAX) 17 g packet Take 17 g by mouth 2 (two) times daily. 30 packet 3   Probiotic Product (PROBIOTIC ACIDOPHILUS BEADS PO) Take by mouth.     rizatriptan (MAXALT) 10 MG tablet Take 1 tablet (10 mg total) by mouth as needed for migraine (May repeat after 2 hours.  Maximum 2 tablets in 24 hours.). May repeat in 2 hours if needed 11 tablet 5   senna-docusate (SENOKOT S)  8.6-50 MG tablet Take 1 tablet  by mouth at bedtime as needed for mild constipation.     tiZANidine (ZANAFLEX) 4 MG tablet Take one-half to one tablet by mouth at bedtime as needed for neck pain 30 tablet 11   valACYclovir (VALTREX) 1000 MG tablet Take 2 tablets (2,000 mg total) by mouth 2 (two) times daily. 30 tablet 1   vitamin B-12 (CYANOCOBALAMIN) 500 MCG tablet Take 500 mcg by mouth daily.     Vitamins-Lipotropics (B-100 CR PO) Take by mouth.     Wheat Dextrin (BENEFIBER) POWD Use as directed BID  0   Zinc 30 MG CAPS Take by mouth.     ondansetron (ZOFRAN-ODT) 4 MG disintegrating tablet TAKE 1 TABLET BY MOUTH EVERY 8 HOURS AS NEEDED FOR NAUSEA AND VOMITING 30 tablet 0   benzonatate (TESSALON PERLES) 100 MG capsule Take 1-2 capsules (100-200 mg total) by mouth 2 (two) times daily as needed for cough. 21 capsule 1   No facility-administered medications prior to visit.     EXAM:  BP 126/80 (BP Location: Left Arm, Patient Position: Sitting, Cuff Size: Normal)   Pulse 68   Temp 98.6 F (37 C) (Oral)   Ht '5\' 5"'$  (1.651 m)   Wt 170 lb 12.8 oz (77.5 kg)   SpO2 98%   BMI 28.42 kg/m   Body mass index is 28.42 kg/m. Wt Readings from Last 3 Encounters:  03/10/22 170 lb 12.8 oz (77.5 kg)  01/19/22 170 lb (77.1 kg)  12/09/21 171 lb 12.8 oz (77.9 kg)    GENERAL: vitals reviewed and listed above, alert, oriented, appears well hydrated and in no acute distress HEENT: atraumatic, conjunctiva  clear to minimally pink, no obvious abnormalities on inspection of external nose and ears  NECK: no obvious masses on inspection palpation  LUNGS: clear to auscultation bilaterally, no wheezes, rales or rhonchi, good air movement CV: HRRR, no clubbing cyanosis or  peripheral edema nl cap refill  MS: moves all extremities without noticeable focal  abnormality PSYCH: pleasant and cooperative, no obvious depression or anxiety talk normal speech cognition Lab Results  Component Value Date   WBC 10.3  10/16/2021   HGB 12.4 10/16/2021   HCT 38.3 10/16/2021   PLT 175 10/16/2021   GLUCOSE 194 (H) 10/16/2021   CHOL 162 04/08/2021   TRIG 185.0 (H) 04/08/2021   HDL 49.90 04/08/2021   LDLCALC 75 04/08/2021   ALT 21 10/15/2021   AST 20 10/15/2021   NA 137 10/16/2021   K 3.9 10/16/2021   CL 103 10/16/2021   CREATININE 0.74 10/16/2021   BUN 7 (L) 10/16/2021   CO2 26 10/16/2021   TSH 1.85 04/08/2021   BP Readings from Last 3 Encounters:  03/10/22 126/80  01/19/22 124/70  12/09/21 116/78   Lab Results  Component Value Date   HENIDPOE42 353 09/21/2012  MMSE given 30 out of 30 fairly rapidly done clock drawing was normal.  Is able to do with ease  ASSESSMENT AND PLAN:  Discussed the following assessment and plan:  Forgetfulness - excellen mmse   in timing consider other factors  medication sleep etc,do onto advise medication but optimize cv risk factors  Medication management - ok to refill zofran to use if needed  Anticoagulant long-term use - plan 6 mos     august 2023   Acute nonintractable headache, unspecified headache type - improved with chiro care? caution with tizanidine  Itchy eyes - on pataday trial non sedating antihistamine ? 45 minutes review plan time mmse etc  -Patient  advised to return or notify health care team  if  new concerns arise.  Patient Instructions  Try 1/2 dose of tizanidine   at night to see if less foggy or forgetful Sometimes medication.   Can cause drowsiness. Fatigue in day.   Stay on the  anticoagulant until 6 months  after  clot began.  August 2023  Ok to take b12   b complex .  Will refill the  zofran .   Good bp, sleep and less anxiety   helps memory.  You did great on the memory screen   [name]. Aydon Swamy M.D.

## 2022-03-10 NOTE — Patient Instructions (Addendum)
Try 1/2 dose of tizanidine   at night to see if less foggy or forgetful Sometimes medication.   Can cause drowsiness. Fatigue in day.   Stay on the  anticoagulant until 6 months  after  clot began.  August 2023  Ok to take b12   b complex .  Will refill the  zofran .   Good bp, sleep and less anxiety   helps memory.  You did great on the memory screen

## 2022-03-13 ENCOUNTER — Other Ambulatory Visit: Payer: Self-pay | Admitting: Gastroenterology

## 2022-03-14 ENCOUNTER — Other Ambulatory Visit: Payer: Self-pay | Admitting: Physician Assistant

## 2022-03-14 ENCOUNTER — Other Ambulatory Visit: Payer: Self-pay | Admitting: Internal Medicine

## 2022-03-15 ENCOUNTER — Other Ambulatory Visit: Payer: Self-pay | Admitting: Internal Medicine

## 2022-03-18 NOTE — Progress Notes (Deleted)
Office Visit Note  Patient: Michele Monroe             Date of Birth: [birth year]           MRN: [id number]             PCP: Burnis Medin, MD Referring: Burnis Medin, MD Visit Date: 04/01/2022 Occupation: '@GUAROCC'$ @  Subjective:  No chief complaint on file.   History of Present Illness: Michele Monroe is a 76 y.o. female ***   Activities of Daily Living:  Patient reports morning stiffness for *** {minute/hour:19697}.   Patient {ACTIONS;DENIES/REPORTS:21021675::"Denies"} nocturnal pain.  Difficulty dressing/grooming: {ACTIONS;DENIES/REPORTS:21021675::"Denies"} Difficulty climbing stairs: {ACTIONS;DENIES/REPORTS:21021675::"Denies"} Difficulty getting out of chair: {ACTIONS;DENIES/REPORTS:21021675::"Denies"} Difficulty using hands for taps, buttons, cutlery, and/or writing: {ACTIONS;DENIES/REPORTS:21021675::"Denies"}  No Rheumatology ROS completed.   PMFS History:  Patient Active Problem List   Diagnosis Date Noted   Dizziness 05/05/2019   Varicose veins with pain 03/06/2015   Renal lesion 08/10/2014   Recurrent headache 08/10/2014   Hyperlipidemia 08/10/2014   Essential hypertension 08/10/2014   Neck pain on left side 02/08/2014   Allergic rhinitis ? 02/08/2014   Varicose veins of bilateral lower extremities with other complications 00/93/8182   Vasomotor flushing 06/19/2013   Varicose veins 08/13/2012   Mild anemia 08/13/2012   Recurrent acute sinusitis 08/13/2012   Bilateral neck pain 08/13/2012   Stress headaches 08/13/2012   Coughing up blood 08/13/2012   Constipation 07/25/2012   Subcutaneous nodule 10/06/2011   VITAMIN D DEFICIENCY 06/30/2010   HYPERLIPIDEMIA 06/30/2010   HYPERTENSION 06/30/2010   ALLERGIC RHINITIS 06/30/2010   GERD 06/30/2010   IBS 06/30/2010   Headache 06/30/2010    Past Medical History:  Diagnosis Date   Adenomatous colon polyp    ALLERGIC RHINITIS 06/30/2010   perennial   Allergy    seasonal allergies   Anxiety    hx of    Arthritis    bilateral hands (R>L)   Coughing up blood 08/13/2012   piece of tissue? will send to path poss from upper airway sinusitis  Final path was "abscess"    Depression    hx of   GERD 06/30/2010   on meds   Headache(784.0) 06/30/2010   migraines   Hepatic steatosis    HYPERLIPIDEMIA 06/30/2010   on meds   HYPERTENSION 06/30/2010   on meds   IBS 06/30/2010   Internal hemorrhoids    Renal lesion    Varicose veins    VITAMIN D DEFICIENCY 06/30/2010    Family History  Problem Relation Age of Onset   Colon cancer Mother        age 64   Colon polyps Mother    Breast cancer Cousin    Arthritis Brother    Other Brother    Colon polyps Brother    Esophageal cancer Neg Hx    Stomach cancer Neg Hx    Rectal cancer Neg Hx    Past Surgical History:  Procedure Laterality Date   BREAST CYST ASPIRATION     BREAST SURGERY     needle bx left breast   COLONOSCOPY  2016   KN-prep good-TA   feet     hammer toe sx   POLYPECTOMY  2016   TA   ROTATOR CUFF REPAIR Right    varicose veins     Social History   Social History Narrative   Widower  Husband died 21  PTSD in his sleep   Former smoker in her 20's  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       Lives in [place]      3 cups of caffinated coffee per day. Denies soda/tea   Right handed   Immunization History  Administered Date(s) Administered   Fluad Quad(high Dose 65+) 05/02/2019   Influenza Split 06/05/2012, 05/15/2013   Influenza, High Dose Seasonal PF 04/20/2016, 06/23/2017, 06/19/2018   Influenza,inj,Quad PF,6+ Mos 06/03/2015   Influenza-Unspecified 06/06/2014   Pneumococcal Conjugate-13 08/07/2012   Pneumococcal Polysaccharide-23 09/24/2012   Td 09/07/2007   Zoster, Live 09/24/2012     Objective: Vital Signs: There were no vitals taken for this visit.   Physical Exam   Musculoskeletal Exam: ***  CDAI Exam: CDAI Score:  -- Patient Global: --; Provider Global: -- Swollen: --; Tender: -- Joint Exam 04/01/2022   No joint exam has been documented for this visit   There is currently no information documented on the homunculus. Go to the Rheumatology activity and complete the homunculus joint exam.  Investigation: No additional findings.  Imaging: No results found.  Recent Labs: Lab Results  Component Value Date   WBC 10.3 10/16/2021   HGB 12.4 10/16/2021   PLT 175 10/16/2021   NA 137 10/16/2021   K 3.9 10/16/2021   CL 103 10/16/2021   CO2 26 10/16/2021   GLUCOSE 194 (H) 10/16/2021   BUN 7 (L) 10/16/2021   CREATININE 0.74 10/16/2021   BILITOT 0.7 10/15/2021   ALKPHOS 63 10/15/2021   AST 20 10/15/2021   ALT 21 10/15/2021   PROT 7.3 10/15/2021   ALBUMIN 3.9 10/15/2021   CALCIUM 9.8 10/16/2021    Speciality Comments: No specialty comments available.  Procedures:  No procedures performed Allergies: Patient has no known allergies.   Assessment / Plan:     Visit Diagnoses: Polyarthralgia  Varicose veins of bilateral lower extremities with other complications  Vasomotor flushing  Essential hypertension  History of IBS  History of gastroesophageal reflux (GERD)  Vitamin D deficiency  Recurrent headache  Mild anemia  History of hyperlipidemia  Orders: No orders of the defined types were placed in this encounter.  No orders of the defined types were placed in this encounter.   Face-to-face time spent with patient was *** minutes. Greater than 50% of time was spent in counseling and coordination of care.  Follow-Up Instructions: No follow-ups on file.   [name], PA-C  Note - This record has been created using Dragon software.  Chart creation errors have been sought, but may not always  have been located. Such creation errors do not reflect on  the standard of medical care.

## 2022-03-23 NOTE — Progress Notes (Signed)
Virtual Visit via Video Note  Consent was obtained for video visit:  Yes.   Answered questions that patient had about telehealth interaction:  Yes.   I discussed the limitations, risks, security and privacy concerns of performing an evaluation and management service by telemedicine. I also discussed with the patient that there may be a patient responsible charge related to this service. The patient expressed understanding and agreed to proceed.  Pt location: Home Physician Location: office Name of referring provider:  [name], MD I connected with Michele Monroe at patients initiation/request on 03/24/2022 at  9:30 AM EDT by video enabled telemedicine application and verified that I am speaking with the correct person using two identifiers. Pt MRN:  [id number] Pt DOB:  Jan 01, 1946 Video Participants:  Michele Monroe;  Assessment and Plan:    Migraine without aura, without status migrainosus, not intractable Memory deficits - at this time, it appears normal age-related.   1.  Migraine rescue:  Rizatriptan 10mg  for migraine; tizanidine for neck pain.  We discussed considering discontinuing rizatriptan at next visit.  While she has no history of CAD or stroke, age alone is a risk factor (although not an absolute contraindication for taking triptans). 3.  Limit use of pain relievers to no more than 2 days out of week to prevent risk of rebound or medication-overuse headache. 4.  Keep headache diary 5.  Follow up 1 year.  Subjective:  Michele Monroe is a 76 year old right-handed female with hypertension, GERD, IBS, hyperlipidemia who follows up to discuss memory deficits as well as migraines.   UPDATE: Migraines have been well-controlled.  No longer on gabapentin. Intensity:  severe Duration:  1 hour Frequency:  on average 4 a month (more or less depending on anxiety or weather) Abortive therapy:  Rizatriptan 10mg  for migraines, tramadol for abortive neck and shoulder pain. Anti-emetic:   Zofran ODT 4mg   Muscle relaxant:  Tizanidine 4mg  at bedtime Antihypertensive:  Atenolol 25mg  daily Other treatment:  chiropractic medicine Other medications:  Eliquis (for DVT)  She notices some memory problems.  She may forget why she walked into a room.  Sometimes she needs to use the GPS but not always and not generally getting lost on familiar routes. Able to manage  her finances.  Her family has not been concerned about her memory.  Her PCP gave her a MMSE and she scored 30/30.     HISTORY: Onset: 76 years old Location:  Bilateral occipital region Quality:  throbbing Initial intensity:  Starts as 3/10 and progresses to 10/10 She has chronic neck pain that is worse with neck movement.  Migraines often preceded by flare up of neck and shoulder pain. Aura:  no Prodrome:  no Postdrome:  nausea Associated symptoms: Sometimes nausea and vomiting.  Photophobia and phonophobia.  No visual disturbance..  She has not had any new worse headache of her life, waking up from sleep Initial Duration:  Several days without treatment (30 minutes with Maxalt) Initial Frequency:  2 to 3 days per week Initial Frequency of abortive medication: 2 days per week Triggers/exacerbating factors: Neck movement, change in barometric pressure Relieving factors: None Activity:  Aggravates.  Needs to lay down   Past NSAIDS:  Ibuprofen, Aleve, Excedrin Past analgesics:  Excedrin, Tylenol, tramadol Past abortive triptans:  no Past muscle relaxants:  Flexeril Past anti-emetic:  Yes but uncertain of type Past antihypertensive medications:  no Past antidepressant medications:  Unsure.  Not amitriptyline, nortriptyline or Effexor Past  anticonvulsant medications:  topiramate (ineffective), gabapentin Past vitamins/Herbal/Supplements:  no Other past therapies:  heat and ice, exercises, massage for neck pain   Family history of headache:  Daughter has migraines   Report of an old CT of the head from 06/24/05  (images not currently available for review) demonstrated "two areas of low density in the inferior left basal ganglia" felt to be more likely prominent perivascular spaces rather than old lacunar infarcts.  CT of cervical spine on the same day demonstrated multilevel degenerative changes.  MRI of cervical spine from 06/06/17 was personally reviewed and demonstrated multilevel spondylolisthesis with cervical facet arthropathy with severe neural foraminal stenosis at bilateral C6 and left C7.  She was evaluated by interventional pain management who told her nothing interventional was warranted as it was all arthritic changes.  Past Medical History: Past Medical History:  Diagnosis Date   Adenomatous colon polyp    ALLERGIC RHINITIS 06/30/2010   perennial   Allergy    seasonal allergies   Anxiety    hx of   Arthritis    bilateral hands (R>L)   Coughing up blood 08/13/2012   piece of tissue? will send to path poss from upper airway sinusitis  Final path was "abscess"    Depression    hx of   GERD 06/30/2010   on meds   Headache(784.0) 06/30/2010   migraines   Hepatic steatosis    HYPERLIPIDEMIA 06/30/2010   on meds   HYPERTENSION 06/30/2010   on meds   IBS 06/30/2010   Internal hemorrhoids    Renal lesion    Varicose veins    VITAMIN D DEFICIENCY 06/30/2010    Medications: Outpatient Encounter Medications as of 03/24/2022  Medication Sig Note   albuterol (VENTOLIN HFA) 108 (90 Base) MCG/ACT inhaler Inhale 2 puffs by mouth every 6 hours as needed. LAST refill pt needs appt.    Ascorbic Acid (VITAMIN C) 1000 MG tablet Take 1,000 mg by mouth daily.    atenolol (TENORMIN) 25 MG tablet Take 1 tablet by mouth every day    atorvastatin (LIPITOR) 20 MG tablet Take 1 tablet by mouth every day    carbamide peroxide (DEBROX) 6.5 % OTIC solution Place 5 drops into both ears 2 (two) times daily. 03/27/2021: On hand   cholecalciferol (VITAMIN D3) 25 MCG (1000 UNIT) tablet Take 1,000 Units by  mouth daily.    ELIQUIS 5 MG TABS tablet TAKE 1 TABLET BY MOUTH TWICE A DAY    hyoscyamine (LEVSIN SL) 0.125 MG SL tablet PLACE 1 TABLET UNDER THE TONGUE DAILY AS NEEDED.    ondansetron (ZOFRAN-ODT) 4 MG disintegrating tablet TAKE 1 TABLET BY MOUTH EVERY 8 HOURS AS NEEDED FOR NAUSEA AND VOMITING    pantoprazole (PROTONIX) 40 MG tablet Take 1 tablet by mouth every day    polyethylene glycol (MIRALAX / GLYCOLAX) 17 g packet Take 17 g by mouth 2 (two) times daily.    Probiotic Product (PROBIOTIC ACIDOPHILUS BEADS PO) Take by mouth.    rizatriptan (MAXALT) 10 MG tablet Take 1 tablet (10 mg total) by mouth as needed for migraine (May repeat after 2 hours.  Maximum 2 tablets in 24 hours.). May repeat in 2 hours if needed    senna-docusate (SENOKOT S) 8.6-50 MG tablet Take 1 tablet by mouth at bedtime as needed for mild constipation.    tiZANidine (ZANAFLEX) 4 MG tablet Take one-half to one tablet by mouth at bedtime as needed for neck pain    valACYclovir (VALTREX)  1000 MG tablet Take 2 tablets (2,000 mg total) by mouth 2 (two) times daily.    vitamin B-12 (CYANOCOBALAMIN) 500 MCG tablet Take 500 mcg by mouth daily.    Vitamins-Lipotropics (B-100 CR PO) Take by mouth.    Wheat Dextrin (BENEFIBER) POWD Use as directed BID    Zinc 30 MG CAPS Take by mouth.    No facility-administered encounter medications on file as of 03/24/2022.    Allergies: No Known Allergies  Family History: Family History  Problem Relation Age of Onset   Colon cancer Mother        age 36   Colon polyps Mother    Breast cancer Cousin    Arthritis Brother    Other Brother    Colon polyps Brother    Esophageal cancer Neg Hx    Stomach cancer Neg Hx    Rectal cancer Neg Hx     Observations/Objective:   There were no vitals taken for this visit. No acute distress.  Alert and oriented.  Speech fluent and not dysarthric.  Language intact.     Follow Up Instructions:    -I discussed the assessment and treatment plan  with the patient. The patient was provided an opportunity to ask questions and all were answered. The patient agreed with the plan and demonstrated an understanding of the instructions.   The patient was advised to call back or seek an in-person evaluation if the symptoms worsen or if the condition fails to improve as anticipated.   Cira Servant, DO

## 2022-03-24 ENCOUNTER — Telehealth (INDEPENDENT_AMBULATORY_CARE_PROVIDER_SITE_OTHER): Payer: Medicare Other | Admitting: Neurology

## 2022-03-24 ENCOUNTER — Encounter: Payer: Self-pay | Admitting: Neurology

## 2022-03-24 DIAGNOSIS — G43009 Migraine without aura, not intractable, without status migrainosus: Secondary | ICD-10-CM

## 2022-03-24 DIAGNOSIS — R413 Other amnesia: Secondary | ICD-10-CM | POA: Diagnosis not present

## 2022-03-30 ENCOUNTER — Other Ambulatory Visit: Payer: Self-pay | Admitting: Gastroenterology

## 2022-03-31 ENCOUNTER — Telehealth: Payer: Self-pay | Admitting: Rheumatology

## 2022-03-31 NOTE — Telephone Encounter (Signed)
Patient called asking if her npt appointment scheduled for tomorrow, 04/01/22 at 10:20 could be virtual.  Patient states she is dealing with an upset stomach, as well as allergies.  Please advise.

## 2022-04-01 ENCOUNTER — Ambulatory Visit: Payer: Medicare Other | Admitting: Rheumatology

## 2022-04-01 DIAGNOSIS — R232 Flushing: Secondary | ICD-10-CM

## 2022-04-01 DIAGNOSIS — E559 Vitamin D deficiency, unspecified: Secondary | ICD-10-CM

## 2022-04-01 DIAGNOSIS — Z8639 Personal history of other endocrine, nutritional and metabolic disease: Secondary | ICD-10-CM

## 2022-04-01 DIAGNOSIS — D649 Anemia, unspecified: Secondary | ICD-10-CM

## 2022-04-01 DIAGNOSIS — M2550 Pain in unspecified joint: Secondary | ICD-10-CM

## 2022-04-01 DIAGNOSIS — R519 Headache, unspecified: Secondary | ICD-10-CM

## 2022-04-01 DIAGNOSIS — I10 Essential (primary) hypertension: Secondary | ICD-10-CM

## 2022-04-01 DIAGNOSIS — I83893 Varicose veins of bilateral lower extremities with other complications: Secondary | ICD-10-CM

## 2022-04-01 DIAGNOSIS — Z8719 Personal history of other diseases of the digestive system: Secondary | ICD-10-CM

## 2022-04-28 ENCOUNTER — Other Ambulatory Visit: Payer: Self-pay

## 2022-04-28 ENCOUNTER — Telehealth: Payer: Self-pay | Admitting: Internal Medicine

## 2022-04-28 MED ORDER — HYOSCYAMINE SULFATE 0.125 MG SL SUBL: SUBLINGUAL_TABLET | SUBLINGUAL | 0 refills | Status: DC

## 2022-04-28 NOTE — Telephone Encounter (Signed)
Patient has a new pharmacy- [facility]. I have sent a refill on her hyoscyamine as requested to them.

## 2022-04-28 NOTE — Telephone Encounter (Signed)
Rose with [facility]  [phone]  Calling to follow up on the faxed request on 04/19/22 for PA for the following medications:  rizatriptan (MAXALT) 10 MG tablet ELIQUIS 5 MG TABS tablet  albuterol (VENTOLIN HFA) 108 (90 Base) MCG/ACT inhaler tiZANidine (ZANAFLEX) 4 MG tablet valACYclovir (VALTREX) 1000 MG tablet ondansetron (ZOFRAN-ODT) 4 MG disintegrating tablet omeprazole (PRILOSEC) 20 MG capsule atenolol (TENORMIN) 25 MG tablet atorvastatin (LIPITOR) 20 MG tablet  LOV:  03/10/22  Please advise.

## 2022-05-03 ENCOUNTER — Other Ambulatory Visit: Payer: Self-pay | Admitting: Gastroenterology

## 2022-05-03 NOTE — Telephone Encounter (Signed)
I have left Amonda a message to call me back. I refilled this medicine last week to [facility] and today this new pharmacy request it. I told her to call us back and let me know if she switched pharmacy's since last week.

## 2022-05-05 NOTE — Telephone Encounter (Signed)
I spoke to Michele Monroe and confirmed now her pharmacy is [facility]. She may change again in October. I sent her refill of the hyoscyamine to them.

## 2022-05-06 ENCOUNTER — Other Ambulatory Visit: Payer: Self-pay | Admitting: Gastroenterology

## 2022-05-06 ENCOUNTER — Other Ambulatory Visit: Payer: Self-pay

## 2022-05-06 NOTE — Telephone Encounter (Signed)
I received a fax from [facility] for patient's omeprazole '40mg'$  one daily. I called to confirm the dose and she said she is taking omeprazole '20mg'$  one twice daily. She said don't refill until she let's [place] know if she needs it. She has been getting rx's from [facility] that was her previous pharmacy.

## 2022-05-10 ENCOUNTER — Other Ambulatory Visit: Payer: Self-pay | Admitting: Gastroenterology

## 2022-05-11 ENCOUNTER — Other Ambulatory Visit: Payer: Self-pay

## 2022-05-11 MED ORDER — HYOSCYAMINE SULFATE 0.125 MG SL SUBL: SUBLINGUAL_TABLET | SUBLINGUAL | 0 refills | Status: DC

## 2022-05-11 NOTE — Telephone Encounter (Signed)
Called patient in regards to [facility]. Left a voicemail to call back.

## 2022-05-11 NOTE — Telephone Encounter (Signed)
Spoke to patient in regards to [facility]. Patient reports she is doesn't want to go with the pharmacy and is waiting on her decision. Tried to [facility] with [facility]. Left a voicemail to call back.

## 2022-05-11 NOTE — Telephone Encounter (Signed)
Hyoscyamine refilled to [place]. Her new pharmacy.

## 2022-05-12 ENCOUNTER — Other Ambulatory Visit: Payer: Self-pay

## 2022-05-12 ENCOUNTER — Encounter: Payer: Self-pay | Admitting: Internal Medicine

## 2022-05-12 ENCOUNTER — Telehealth (INDEPENDENT_AMBULATORY_CARE_PROVIDER_SITE_OTHER): Payer: Medicare Other | Admitting: Internal Medicine

## 2022-05-12 VITALS — Ht 65.0 in | Wt 167.0 lb

## 2022-05-12 DIAGNOSIS — L299 Pruritus, unspecified: Secondary | ICD-10-CM | POA: Diagnosis not present

## 2022-05-12 DIAGNOSIS — Z7901 Long term (current) use of anticoagulants: Secondary | ICD-10-CM | POA: Diagnosis not present

## 2022-05-12 DIAGNOSIS — W57XXXA Bitten or stung by nonvenomous insect and other nonvenomous arthropods, initial encounter: Secondary | ICD-10-CM

## 2022-05-12 DIAGNOSIS — Z79899 Other long term (current) drug therapy: Secondary | ICD-10-CM

## 2022-05-12 DIAGNOSIS — Z86718 Personal history of other venous thrombosis and embolism: Secondary | ICD-10-CM

## 2022-05-12 HISTORY — DX: Long term (current) use of anticoagulants: Z79.01

## 2022-05-12 MED ORDER — FLUOCINONIDE EMULSIFIED BASE 0.05 % EX CREA: 1.0000 | TOPICAL_CREAM | Freq: Two times a day (BID) | CUTANEOUS | 1 refills | Status: DC

## 2022-05-12 NOTE — Progress Notes (Signed)
Virtual Visit via Video Note  I connected with Michele Monroe on 05/12/22 at 10:00 AM EDT by a video enabled telemedicine application and verified that I am speaking with the correct person using two identifiers. Location patient: home Location provider:r home office Persons participating in the virtual visit: patient, provider  Patient aware  of the limitations of evaluation and management by telemedicine and  availability of in person appointments. and agreed to proceed.   HPI: Michele Monroe presents for video visit  onset 3-4 days ago of bug bite  bumps  lower  legs and then some to hips   taking care of  families's dogs x 3 and has had to be outside  using [place] 10 but still itchy a lot and bothering her .  N o hives  takes  antihistamine.   Sx g down to toes    doesn't describe tick bites . Dogs without sx .  Asks about  anticoagulation   just got a refill  no sx recurrence of sx . No bleeding no new sx reported ( see vascular consult    ROS: See pertinent positives and negatives per HPI. Saw dr Michele Monroe for her migraines in July   forgetfulness felt to be typical for age.  Past Medical History:  Diagnosis Date   Adenomatous colon polyp    ALLERGIC RHINITIS 06/30/2010   perennial   Allergy    seasonal allergies   Anxiety    hx of   Arthritis    bilateral hands (R>L)   Coughing up blood 08/13/2012   piece of tissue? will send to path poss from upper airway sinusitis  Final path was "abscess"    Depression    hx of   GERD 06/30/2010   on meds   Headache(784.0) 06/30/2010   migraines   Hepatic steatosis    HYPERLIPIDEMIA 06/30/2010   on meds   HYPERTENSION 06/30/2010   on meds   IBS 06/30/2010   Internal hemorrhoids    Renal lesion    Varicose veins    VITAMIN D DEFICIENCY 06/30/2010    Past Surgical History:  Procedure Laterality Date   BREAST CYST ASPIRATION     BREAST SURGERY     needle bx left breast   COLONOSCOPY  2016   KN-prep good-TA   feet     hammer  toe sx   POLYPECTOMY  2016   TA   ROTATOR CUFF REPAIR Right    varicose veins      Family History  Problem Relation Age of Onset   Colon cancer Mother        age 35   Colon polyps Mother    Breast cancer Cousin    Arthritis Brother    Other Brother    Colon polyps Brother    Esophageal cancer Neg Hx    Stomach cancer Neg Hx    Rectal cancer Neg Hx     Social History   Tobacco Use   Smoking status: Former    Types: Cigarettes    Quit date: 09/06/1976    Years since quitting: 45.7   Smokeless tobacco: Never  Vaping Use   Vaping Use: Never used  Substance Use Topics   Alcohol use: No    Alcohol/week: 0.0 standard drinks of alcohol    Comment: rarely   Drug use: No      Current Outpatient Medications:    albuterol (VENTOLIN HFA) 108 (90 Base) MCG/ACT inhaler, Inhale 2 puffs by  mouth every 6 hours as needed. LAST refill pt needs appt., Disp: 8.5 each, Rfl: 11   Ascorbic Acid (VITAMIN C) 1000 MG tablet, Take 1,000 mg by mouth daily., Disp: , Rfl:    atenolol (TENORMIN) 25 MG tablet, Take 1 tablet by mouth every day, Disp: 30 tablet, Rfl: 11   atorvastatin (LIPITOR) 20 MG tablet, Take 1 tablet by mouth every day, Disp: 30 tablet, Rfl: 11   cholecalciferol (VITAMIN D3) 25 MCG (1000 UNIT) tablet, Take 1,000 Units by mouth daily., Disp: , Rfl:    ELIQUIS 5 MG TABS tablet, TAKE 1 TABLET BY MOUTH TWICE A DAY, Disp: 60 tablet, Rfl: 3   fluocinonide-emollient (LIDEX-E) 0.05 % cream, Apply 1 Application topically 2 (two) times daily. For itchy bug bites not on face, Disp: 15 g, Rfl: 1   hyoscyamine (ANASPAZ) 0.125 MG TBDP disintergrating tablet, take 1 every morning under tongue, Disp: 30 tablet, Rfl: 1   omeprazole (PRILOSEC) 20 MG capsule, Take 20 mg by mouth 2 (two) times daily before a meal., Disp: , Rfl:    ondansetron (ZOFRAN-ODT) 4 MG disintegrating tablet, TAKE 1 TABLET BY MOUTH EVERY 8 HOURS AS NEEDED FOR NAUSEA AND VOMITING, Disp: 30 tablet, Rfl: 0   Probiotic Product  (PROBIOTIC ACIDOPHILUS BEADS PO), Take by mouth., Disp: , Rfl:    rizatriptan (MAXALT) 10 MG tablet, Take 1 tablet (10 mg total) by mouth as needed for migraine (May repeat after 2 hours.  Maximum 2 tablets in 24 hours.). May repeat in 2 hours if needed, Disp: 11 tablet, Rfl: 5   tiZANidine (ZANAFLEX) 4 MG tablet, Take one-half to one tablet by mouth at bedtime as needed for neck pain, Disp: 30 tablet, Rfl: 11   vitamin B-12 (CYANOCOBALAMIN) 500 MCG tablet, Take 500 mcg by mouth daily., Disp: , Rfl:    Zinc 30 MG CAPS, Take by mouth., Disp: , Rfl:    carbamide peroxide (DEBROX) 6.5 % OTIC solution, Place 5 drops into both ears 2 (two) times daily. (Patient not taking: Reported on 05/12/2022), Disp: 15 mL, Rfl: 0   hyoscyamine (LEVSIN SL) 0.125 MG SL tablet, PLACE 1 TABLET UNDER THE TONGUE DAILY AS NEEDED. (Patient not taking: Reported on 05/12/2022), Disp: 90 tablet, Rfl: 0   polyethylene glycol (MIRALAX / GLYCOLAX) 17 g packet, Take 17 g by mouth 2 (two) times daily. (Patient not taking: Reported on 05/12/2022), Disp: 30 packet, Rfl: 3   senna-docusate (SENOKOT S) 8.6-50 MG tablet, Take 1 tablet by mouth at bedtime as needed for mild constipation. (Patient not taking: Reported on 05/12/2022), Disp: , Rfl:    valACYclovir (VALTREX) 1000 MG tablet, Take 2 tablets (2,000 mg total) by mouth 2 (two) times daily. (Patient not taking: Reported on 05/12/2022), Disp: 30 tablet, Rfl: 1  EXAM: BP Readings from Last 3 Encounters:  03/10/22 126/80  01/19/22 124/70  12/09/21 116/78    VITALS per patient if applicable:  GENERAL: alert, oriented, appears well and in no acute distress Will send pictures   PSYCH/NEURO: pleasant and cooperative, no obvious depression or anxiety, speech and thought processing grossly intact   ASSESSMENT AND PLAN:  Discussed the following assessment and plan:    ICD-10-CM   1. Bug bite, initial encounter  W57.XXXA    hx of local rx to insect bites mosquitos usually    2. Itching   L29.9     3. Medication management  Z79.899     4. Anticoagulant long-term use  Z79.01     5.  History of DVT of lower extremity  Z86.718      Problem List Items Addressed This Visit       Other   Medication management   History of DVT of lower extremity   Anticoagulant long-term use    Has had vascular assessment  And agreed 6 mos anticoag is appropriate.  Ok to [employer] anticoagulant after finishes this refill  September       Other Visit Diagnoses     Bug bite, initial encounter    -  Primary   hx of local rx to insect bites mosquitos usually   Itching          Local care   intensify  steroid topical and avoidance  send in pix for baseline   Counseled.   Expectant management and discussion of plan and treatment with opportunity to ask questions and all were answered. The patient agreed with the plan and demonstrated an understanding of the instructions.   Advised to call back or seek an in-person evaluation if worsening  or having  further concerns  in interim. Return for as indicated.    Shanon Ace, MD  Vascular consult  '4 [phone] '$ 76 year old female follows up for evaluation of extensive right lower extremity DVT that did not include the common femoral vein.  Most of her swelling is resolved she probably has about 2 to 3% swelling residual does not need any intervention from the standpoint.  I discussed with her the need for compression socks as well as elevating her legs when recumbent and the need to continue exercise mostly walking and the benefits of water aerobics as well.  From a purely DVT standpoint she would require anticoagulation for 3 months given the extent of the DVT I think 6 months is certainly reasonable.  At that time she should transition to baby aspirin daily as long as she can tolerate.  She does not need a repeat DVT ultrasound as this will only show chronic thrombus and it would not guide any further therapies.  She can see me on an as-needed basis.

## 2022-05-12 NOTE — Assessment & Plan Note (Signed)
Has had vascular assessment  And agreed 6 mos anticoag is appropriate.  Ok to [employer] anticoagulant after finishes this refill  September

## 2022-06-02 ENCOUNTER — Other Ambulatory Visit: Payer: Self-pay | Admitting: Internal Medicine

## 2022-06-04 ENCOUNTER — Other Ambulatory Visit: Payer: Self-pay | Admitting: Internal Medicine

## 2022-06-04 DIAGNOSIS — Z1231 Encounter for screening mammogram for malignant neoplasm of breast: Secondary | ICD-10-CM

## 2022-06-17 DIAGNOSIS — M9901 Segmental and somatic dysfunction of cervical region: Secondary | ICD-10-CM | POA: Diagnosis not present

## 2022-06-17 DIAGNOSIS — M9903 Segmental and somatic dysfunction of lumbar region: Secondary | ICD-10-CM | POA: Diagnosis not present

## 2022-06-17 DIAGNOSIS — M9904 Segmental and somatic dysfunction of sacral region: Secondary | ICD-10-CM | POA: Diagnosis not present

## 2022-06-17 DIAGNOSIS — M9905 Segmental and somatic dysfunction of pelvic region: Secondary | ICD-10-CM | POA: Diagnosis not present

## 2022-06-21 DIAGNOSIS — M9904 Segmental and somatic dysfunction of sacral region: Secondary | ICD-10-CM | POA: Diagnosis not present

## 2022-06-21 DIAGNOSIS — M9905 Segmental and somatic dysfunction of pelvic region: Secondary | ICD-10-CM | POA: Diagnosis not present

## 2022-06-21 DIAGNOSIS — M9901 Segmental and somatic dysfunction of cervical region: Secondary | ICD-10-CM | POA: Diagnosis not present

## 2022-06-21 DIAGNOSIS — M9903 Segmental and somatic dysfunction of lumbar region: Secondary | ICD-10-CM | POA: Diagnosis not present

## 2022-06-28 DIAGNOSIS — M9901 Segmental and somatic dysfunction of cervical region: Secondary | ICD-10-CM | POA: Diagnosis not present

## 2022-06-28 DIAGNOSIS — M9903 Segmental and somatic dysfunction of lumbar region: Secondary | ICD-10-CM | POA: Diagnosis not present

## 2022-06-28 DIAGNOSIS — M9905 Segmental and somatic dysfunction of pelvic region: Secondary | ICD-10-CM | POA: Diagnosis not present

## 2022-06-28 DIAGNOSIS — M9904 Segmental and somatic dysfunction of sacral region: Secondary | ICD-10-CM | POA: Diagnosis not present

## 2022-07-02 ENCOUNTER — Other Ambulatory Visit: Payer: Self-pay | Admitting: Gastroenterology

## 2022-07-05 DIAGNOSIS — M9903 Segmental and somatic dysfunction of lumbar region: Secondary | ICD-10-CM | POA: Diagnosis not present

## 2022-07-05 DIAGNOSIS — M9901 Segmental and somatic dysfunction of cervical region: Secondary | ICD-10-CM | POA: Diagnosis not present

## 2022-07-05 DIAGNOSIS — M9905 Segmental and somatic dysfunction of pelvic region: Secondary | ICD-10-CM | POA: Diagnosis not present

## 2022-07-05 DIAGNOSIS — M9904 Segmental and somatic dysfunction of sacral region: Secondary | ICD-10-CM | POA: Diagnosis not present

## 2022-07-09 ENCOUNTER — Ambulatory Visit
Admission: RE | Admit: 2022-07-09 | Discharge: 2022-07-09 | Disposition: A | Discharge status: Home / Self Care | Payer: Medicare Other | Source: Ambulatory Visit | Attending: Internal Medicine | Admitting: Internal Medicine

## 2022-07-09 ENCOUNTER — Ambulatory Visit: Payer: Medicare Other

## 2022-07-09 DIAGNOSIS — Z1231 Encounter for screening mammogram for malignant neoplasm of breast: Secondary | ICD-10-CM | POA: Diagnosis not present

## 2022-07-12 DIAGNOSIS — M9901 Segmental and somatic dysfunction of cervical region: Secondary | ICD-10-CM | POA: Diagnosis not present

## 2022-07-12 DIAGNOSIS — M9903 Segmental and somatic dysfunction of lumbar region: Secondary | ICD-10-CM | POA: Diagnosis not present

## 2022-07-12 DIAGNOSIS — M9905 Segmental and somatic dysfunction of pelvic region: Secondary | ICD-10-CM | POA: Diagnosis not present

## 2022-07-12 DIAGNOSIS — M9904 Segmental and somatic dysfunction of sacral region: Secondary | ICD-10-CM | POA: Diagnosis not present

## 2022-07-18 ENCOUNTER — Other Ambulatory Visit: Payer: Self-pay | Admitting: Internal Medicine

## 2022-07-19 DIAGNOSIS — M9903 Segmental and somatic dysfunction of lumbar region: Secondary | ICD-10-CM | POA: Diagnosis not present

## 2022-07-19 DIAGNOSIS — M9905 Segmental and somatic dysfunction of pelvic region: Secondary | ICD-10-CM | POA: Diagnosis not present

## 2022-07-19 DIAGNOSIS — M9901 Segmental and somatic dysfunction of cervical region: Secondary | ICD-10-CM | POA: Diagnosis not present

## 2022-07-19 DIAGNOSIS — M9904 Segmental and somatic dysfunction of sacral region: Secondary | ICD-10-CM | POA: Diagnosis not present

## 2022-07-25 ENCOUNTER — Other Ambulatory Visit: Payer: Self-pay | Admitting: Internal Medicine

## 2022-07-26 DIAGNOSIS — M9904 Segmental and somatic dysfunction of sacral region: Secondary | ICD-10-CM | POA: Diagnosis not present

## 2022-07-26 DIAGNOSIS — M9901 Segmental and somatic dysfunction of cervical region: Secondary | ICD-10-CM | POA: Diagnosis not present

## 2022-07-26 DIAGNOSIS — M9905 Segmental and somatic dysfunction of pelvic region: Secondary | ICD-10-CM | POA: Diagnosis not present

## 2022-07-26 DIAGNOSIS — M9903 Segmental and somatic dysfunction of lumbar region: Secondary | ICD-10-CM | POA: Diagnosis not present

## 2022-08-02 DIAGNOSIS — M9905 Segmental and somatic dysfunction of pelvic region: Secondary | ICD-10-CM | POA: Diagnosis not present

## 2022-08-02 DIAGNOSIS — M9903 Segmental and somatic dysfunction of lumbar region: Secondary | ICD-10-CM | POA: Diagnosis not present

## 2022-08-02 DIAGNOSIS — M9904 Segmental and somatic dysfunction of sacral region: Secondary | ICD-10-CM | POA: Diagnosis not present

## 2022-08-02 DIAGNOSIS — M9901 Segmental and somatic dysfunction of cervical region: Secondary | ICD-10-CM | POA: Diagnosis not present

## 2022-08-09 DIAGNOSIS — M9905 Segmental and somatic dysfunction of pelvic region: Secondary | ICD-10-CM | POA: Diagnosis not present

## 2022-08-09 DIAGNOSIS — M9903 Segmental and somatic dysfunction of lumbar region: Secondary | ICD-10-CM | POA: Diagnosis not present

## 2022-08-09 DIAGNOSIS — M9904 Segmental and somatic dysfunction of sacral region: Secondary | ICD-10-CM | POA: Diagnosis not present

## 2022-08-09 DIAGNOSIS — M9901 Segmental and somatic dysfunction of cervical region: Secondary | ICD-10-CM | POA: Diagnosis not present

## 2022-08-17 ENCOUNTER — Other Ambulatory Visit: Payer: Self-pay | Admitting: Internal Medicine

## 2022-08-18 DIAGNOSIS — M9904 Segmental and somatic dysfunction of sacral region: Secondary | ICD-10-CM | POA: Diagnosis not present

## 2022-08-18 DIAGNOSIS — M9901 Segmental and somatic dysfunction of cervical region: Secondary | ICD-10-CM | POA: Diagnosis not present

## 2022-08-18 DIAGNOSIS — M9903 Segmental and somatic dysfunction of lumbar region: Secondary | ICD-10-CM | POA: Diagnosis not present

## 2022-08-18 DIAGNOSIS — M9905 Segmental and somatic dysfunction of pelvic region: Secondary | ICD-10-CM | POA: Diagnosis not present

## 2022-08-24 ENCOUNTER — Other Ambulatory Visit: Payer: Self-pay | Admitting: Internal Medicine

## 2022-08-24 DIAGNOSIS — M9904 Segmental and somatic dysfunction of sacral region: Secondary | ICD-10-CM | POA: Diagnosis not present

## 2022-08-24 DIAGNOSIS — M9903 Segmental and somatic dysfunction of lumbar region: Secondary | ICD-10-CM | POA: Diagnosis not present

## 2022-08-24 DIAGNOSIS — M9901 Segmental and somatic dysfunction of cervical region: Secondary | ICD-10-CM | POA: Diagnosis not present

## 2022-08-24 DIAGNOSIS — M9905 Segmental and somatic dysfunction of pelvic region: Secondary | ICD-10-CM | POA: Diagnosis not present

## 2022-09-09 DIAGNOSIS — M9901 Segmental and somatic dysfunction of cervical region: Secondary | ICD-10-CM | POA: Diagnosis not present

## 2022-09-09 DIAGNOSIS — M9904 Segmental and somatic dysfunction of sacral region: Secondary | ICD-10-CM | POA: Diagnosis not present

## 2022-09-09 DIAGNOSIS — M9905 Segmental and somatic dysfunction of pelvic region: Secondary | ICD-10-CM | POA: Diagnosis not present

## 2022-09-09 DIAGNOSIS — M9903 Segmental and somatic dysfunction of lumbar region: Secondary | ICD-10-CM | POA: Diagnosis not present

## 2022-09-13 DIAGNOSIS — M9904 Segmental and somatic dysfunction of sacral region: Secondary | ICD-10-CM | POA: Diagnosis not present

## 2022-09-13 DIAGNOSIS — M9903 Segmental and somatic dysfunction of lumbar region: Secondary | ICD-10-CM | POA: Diagnosis not present

## 2022-09-13 DIAGNOSIS — M9905 Segmental and somatic dysfunction of pelvic region: Secondary | ICD-10-CM | POA: Diagnosis not present

## 2022-09-13 DIAGNOSIS — M9901 Segmental and somatic dysfunction of cervical region: Secondary | ICD-10-CM | POA: Diagnosis not present

## 2022-09-20 DIAGNOSIS — M9903 Segmental and somatic dysfunction of lumbar region: Secondary | ICD-10-CM | POA: Diagnosis not present

## 2022-09-20 DIAGNOSIS — M9901 Segmental and somatic dysfunction of cervical region: Secondary | ICD-10-CM | POA: Diagnosis not present

## 2022-09-20 DIAGNOSIS — M9905 Segmental and somatic dysfunction of pelvic region: Secondary | ICD-10-CM | POA: Diagnosis not present

## 2022-09-20 DIAGNOSIS — M9904 Segmental and somatic dysfunction of sacral region: Secondary | ICD-10-CM | POA: Diagnosis not present

## 2022-09-27 DIAGNOSIS — M9903 Segmental and somatic dysfunction of lumbar region: Secondary | ICD-10-CM | POA: Diagnosis not present

## 2022-09-27 DIAGNOSIS — M9905 Segmental and somatic dysfunction of pelvic region: Secondary | ICD-10-CM | POA: Diagnosis not present

## 2022-09-27 DIAGNOSIS — M9901 Segmental and somatic dysfunction of cervical region: Secondary | ICD-10-CM | POA: Diagnosis not present

## 2022-09-27 DIAGNOSIS — M9904 Segmental and somatic dysfunction of sacral region: Secondary | ICD-10-CM | POA: Diagnosis not present

## 2022-09-27 NOTE — Progress Notes (Signed)
Office Visit Note  Patient: Michele Monroe             Date of Birth: [birth year]           MRN: [id number]             PCP: Burnis Medin, MD Referring: Burnis Medin, MD Visit Date: 10/08/2022 Occupation: '@GUAROCC'$ @  Subjective:  Pain in right hand  History of Present Illness: Michele Monroe is a 77 y.o. female seen in consultation per request of her PCP.  According to the patient she had right rotator cuff tear repair several years ago and has been told that she has arthritis in her right shoulder.  She has chronic pain and discomfort in her right shoulder.  She has been experiencing pain and discomfort in her right hand and intermittent numbness for the last 1 year.  She is right-handed.  She states that she has chronic neck pain due to underlying disc disease in the cervical spine.  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  She has been also told that she has degenerative disc disease of lumbar spine.  She had DVT in her right lower extremity last year for which she was treated with Eliquis for about 6 months.  She still has some discomfort in her leg.  She takes Tylenol for pain relief on as needed basis.  There is no history of joint swelling.  There is no family history of autoimmune disease.  Gravida 3, para 3.    Activities of Daily Living:  Patient reports morning stiffness for 3 minutes.   Patient Reports nocturnal pain.  Difficulty dressing/grooming: Denies Difficulty climbing stairs: Reports Difficulty getting out of chair: Denies Difficulty using hands for taps, buttons, cutlery, and/or writing: Reports  Review of Systems  Constitutional: Negative.  Negative for fatigue.  HENT: Negative.  Negative for mouth sores and mouth dryness.   Eyes:  Positive for dryness.  Respiratory: Negative.  Negative for shortness of breath.   Cardiovascular: Negative.  Negative for chest pain and palpitations.  Gastrointestinal:  Positive for blood in stool and diarrhea.  Negative for constipation.  Endocrine: Negative.  Negative for increased urination.  Genitourinary: Negative.  Negative for involuntary urination.  Musculoskeletal:  Positive for joint pain, joint pain, joint swelling, muscle weakness, morning stiffness and muscle tenderness. Negative for gait problem, myalgias and myalgias.  Skin: Negative.  Negative for color change, rash, hair loss and sensitivity to sunlight.  Allergic/Immunologic: Negative.  Negative for susceptible to infections.  Neurological: Negative.  Negative for dizziness and headaches.  Hematological: Negative.  Negative for swollen glands.  Psychiatric/Behavioral:  Positive for sleep disturbance. Negative for depressed mood and self-injury. The patient is not nervous/anxious.     PMFS History:  Patient Active Problem List   Diagnosis Date Noted   Medication management 05/12/2022   Anticoagulant long-term use 05/12/2022   History of DVT of lower extremity 05/12/2022   Dizziness 05/05/2019   Varicose veins with pain 03/06/2015   Renal lesion 08/10/2014   Recurrent headache 08/10/2014   Hyperlipidemia 08/10/2014   Essential hypertension 08/10/2014   Neck pain on left side 02/08/2014   Allergic rhinitis ? 02/08/2014   Varicose veins of bilateral lower extremities with other complications 29/93/7169   Vasomotor flushing 06/19/2013   Varicose veins 08/13/2012   Mild anemia 08/13/2012   Recurrent acute sinusitis 08/13/2012   Bilateral neck pain 08/13/2012   Stress headaches 08/13/2012   Coughing up blood  08/13/2012   Constipation 07/25/2012   Subcutaneous nodule 10/06/2011   VITAMIN D DEFICIENCY 06/30/2010   HYPERLIPIDEMIA 06/30/2010   HYPERTENSION 06/30/2010   ALLERGIC RHINITIS 06/30/2010   GERD 06/30/2010   IBS 06/30/2010   Headache 06/30/2010    Past Medical History:  Diagnosis Date   Adenomatous colon polyp    ALLERGIC RHINITIS 06/30/2010   perennial   Allergy    seasonal allergies   Anxiety    hx of    Arthritis    bilateral hands (R>L)   Coughing up blood 08/13/2012   piece of tissue? will send to path poss from upper airway sinusitis  Final path was "abscess"    Depression    hx of   GERD 06/30/2010   on meds   Headache(784.0) 06/30/2010   migraines   Hepatic steatosis    HYPERLIPIDEMIA 06/30/2010   on meds   HYPERTENSION 06/30/2010   on meds   IBS 06/30/2010   Internal hemorrhoids    Renal lesion    Varicose veins    VITAMIN D DEFICIENCY 06/30/2010    Family History  Problem Relation Age of Onset   Colon cancer Mother        age 32   Colon polyps Mother    Breast cancer Cousin    Arthritis Brother    Other Brother    Colon polyps Brother    Esophageal cancer Neg Hx    Stomach cancer Neg Hx    Rectal cancer Neg Hx    Past Surgical History:  Procedure Laterality Date   BREAST CYST ASPIRATION     BREAST SURGERY     needle bx left breast   COLONOSCOPY  2016   KN-prep good-TA   feet     hammer toe sx   POLYPECTOMY  2016   TA   ROTATOR CUFF REPAIR Right    varicose veins     Social History   Social History Narrative   Widower  Husband died 54  PTSD in his sleep   Former smoker in her 20's   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       Lives in [place]      3 cups of caffinated coffee per day. Denies soda/tea   Right handed   Immunization History  Administered Date(s) Administered   Fluad Quad(high Dose 65+) 05/02/2019   Influenza Split 06/05/2012, 05/15/2013   Influenza, High Dose Seasonal PF 04/20/2016, 06/23/2017, 06/19/2018   Influenza,inj,Quad PF,6+ Mos 06/03/2015   Influenza-Unspecified 06/06/2014   Pneumococcal Conjugate-13 08/07/2012   Pneumococcal Polysaccharide-23 09/24/2012   Td 09/07/2007   Zoster, Live 09/24/2012     Objective: Vital Signs: BP 132/83 (BP Location: Right Arm, Patient Position: Sitting, Cuff Size: Large)   Pulse 71   Resp 16   Ht '5\' 5"'$  (1.651  m)   Wt 175 lb (79.4 kg)   BMI 29.12 kg/m    Physical Exam Vitals and nursing note reviewed.  Constitutional:      Appearance: She is well-developed.  HENT:     Head: Normocephalic and atraumatic.  Eyes:     Conjunctiva/sclera: Conjunctivae normal.  Cardiovascular:     Rate and Rhythm: Normal rate and regular rhythm.     Heart sounds: Normal heart sounds.  Pulmonary:     Effort: Pulmonary effort is normal.     Breath sounds:  Normal breath sounds.  Abdominal:     General: Bowel sounds are normal.     Palpations: Abdomen is soft.  Musculoskeletal:     Cervical back: Normal range of motion.  Lymphadenopathy:     Cervical: No cervical adenopathy.  Skin:    General: Skin is warm and dry.     Capillary Refill: Capillary refill takes less than 2 seconds.  Neurological:     Mental Status: She is alert and oriented to person, place, and time.  Psychiatric:        Behavior: Behavior normal.      Musculoskeletal Exam: Limited range of motion of the cervical spine and the lumbar spine.  Shoulder joints, elbow joints, wrist joints with good range of motion.  She had bilateral CMC, PIP and DIP thickening with no synovitis consistent with osteoarthritis.  Hip joints and knee joints in good range of motion.  No warmth swelling or effusion was noted.  There was no tenderness over ankles.  She had postsurgical changes in her right foot.  No synovitis was noted.  CDAI Exam: CDAI Score: -- Patient Global: --; Provider Global: -- Swollen: --; Tender: -- Joint Exam 10/08/2022   No joint exam has been documented for this visit   There is currently no information documented on the homunculus. Go to the Rheumatology activity and complete the homunculus joint exam.  Investigation: No additional findings.  Imaging: XR Hand 2 View Right  Result Date: 10/08/2022 CMC, PIP and DIP narrowing was noted.  No MCP, intercarpal or radiocarpal joint space narrowing was noted.  No erosive changes were  noted.  No chondrocalcinosis was noted. Impression: These findings are consistent with osteoarthritis of the hand.   Recent Labs: Lab Results  Component Value Date   WBC 10.3 10/16/2021   HGB 12.4 10/16/2021   PLT 175 10/16/2021   NA 137 10/16/2021   K 3.9 10/16/2021   CL 103 10/16/2021   CO2 26 10/16/2021   GLUCOSE 194 (H) 10/16/2021   BUN 7 (L) 10/16/2021   CREATININE 0.74 10/16/2021   BILITOT 0.7 10/15/2021   ALKPHOS 63 10/15/2021   AST 20 10/15/2021   ALT 21 10/15/2021   PROT 7.3 10/15/2021   ALBUMIN 3.9 10/15/2021   CALCIUM 9.8 10/16/2021    Speciality Comments: No specialty comments available.  Procedures:  No procedures performed Allergies: Patient has no known allergies.   Assessment / Plan:     Visit Diagnoses: Polyarthralgia -patient complains of pain and discomfort in multiple joints over the years.  She has been going to a chiropractor for long time and gets treatment.  10/01/21: ESR and CRP WNL.  Pain in right hand -she complains of discomfort in her right hand.  No synovitis was noted.  She is right-handed.  PIP, DIP and CMC thickening was noted.  Arthritic changes are more prominent in the right than the left hand.  She also gives history of intermittent paresthesias in her right hand.  She declined nerve conduction velocities.  Prescription for right CMC brace was given.  Plan: XR Hand 2 View Right, x-rays are consistent with osteoarthritis of the hand.  Rheumatoid factor, Cyclic citrul peptide antibody, IgG, Uric acid  Primary osteoarthritis of both hands-clinical findings are consistent with osteoarthritis involving bilateral hands.  Detailed counsel regarding osteoarthritis was provided.  A handout on hand exercises was given.  Primary osteoarthritis, right shoulder-she complains of discomfort in her right shoulder joint.  She had x-rays in the past and MRI of  her right shoulder joint.  X-rays from October 18, 2021 showed osteoarthritic changes.  DDD  (degenerative disc disease), cervical-she complains of chronic neck pain.  She has been going to a Restaurant manager, fast food.  MRI of the cervical spine from June 06, 2017 showed multilevel spondylosis and foraminal stenosis.  Handout on cervical spine exercises was given.  DDD (degenerative disc disease), lumbar-she complains of chronic lower back pain.  She states she goes to the chiropractor for the treatment.  I do not have x-rays or MRI results available.  A handout on back exercises was given.  Core strengthening exercises were discussed.  Essential hypertension-blood pressure was normal today at 132/83  Other medical problems listed as follows:  History of hyperlipidemia  Varicose veins of bilateral lower extremities with other complications  History of gastroesophageal reflux (GERD)  History of IBS  History of DVT of lower extremity - 2023. Ttd with Eliquis for 6 months.  Orders: Orders Placed This Encounter  Procedures   XR Hand 2 View Right   Rheumatoid factor   Cyclic citrul peptide antibody, IgG   Uric acid   No orders of the defined types were placed in this encounter.    Follow-Up Instructions: No follow-ups on file.   Bo Merino, MD  Note - This record has been created using Editor, commissioning.  Chart creation errors have been sought, but may not always  have been located. Such creation errors do not reflect on  the standard of medical care.,

## 2022-09-28 ENCOUNTER — Telehealth: Payer: Self-pay | Admitting: Internal Medicine

## 2022-09-28 NOTE — Telephone Encounter (Signed)
Left message for patient to call back and schedule Medicare Annual Wellness Visit (AWV) either virtually or in office. Left  my Michele Monroe number [phone]   Last AWV  10/27/21 please schedule with Nurse Health Adviser   45 min for awv-i  in office appointments 30 min for awv-s & awv-i phone/virtual appointments

## 2022-10-04 DIAGNOSIS — M9905 Segmental and somatic dysfunction of pelvic region: Secondary | ICD-10-CM | POA: Diagnosis not present

## 2022-10-04 DIAGNOSIS — M9903 Segmental and somatic dysfunction of lumbar region: Secondary | ICD-10-CM | POA: Diagnosis not present

## 2022-10-04 DIAGNOSIS — M9901 Segmental and somatic dysfunction of cervical region: Secondary | ICD-10-CM | POA: Diagnosis not present

## 2022-10-04 DIAGNOSIS — M9904 Segmental and somatic dysfunction of sacral region: Secondary | ICD-10-CM | POA: Diagnosis not present

## 2022-10-08 ENCOUNTER — Encounter: Payer: Self-pay | Admitting: Rheumatology

## 2022-10-08 ENCOUNTER — Ambulatory Visit (INDEPENDENT_AMBULATORY_CARE_PROVIDER_SITE_OTHER): Payer: Medicare Other

## 2022-10-08 ENCOUNTER — Ambulatory Visit: Payer: Medicare Other | Attending: Rheumatology | Admitting: Rheumatology

## 2022-10-08 VITALS — BP 132/83 | HR 71 | Resp 16 | Ht 65.0 in | Wt 175.0 lb

## 2022-10-08 DIAGNOSIS — Z8719 Personal history of other diseases of the digestive system: Secondary | ICD-10-CM

## 2022-10-08 DIAGNOSIS — M19042 Primary osteoarthritis, left hand: Secondary | ICD-10-CM | POA: Diagnosis not present

## 2022-10-08 DIAGNOSIS — I83893 Varicose veins of bilateral lower extremities with other complications: Secondary | ICD-10-CM | POA: Diagnosis not present

## 2022-10-08 DIAGNOSIS — M51369 Other intervertebral disc degeneration, lumbar region without mention of lumbar back pain or lower extremity pain: Secondary | ICD-10-CM

## 2022-10-08 DIAGNOSIS — Z86718 Personal history of other venous thrombosis and embolism: Secondary | ICD-10-CM | POA: Diagnosis not present

## 2022-10-08 DIAGNOSIS — M79641 Pain in right hand: Secondary | ICD-10-CM

## 2022-10-08 DIAGNOSIS — M2550 Pain in unspecified joint: Secondary | ICD-10-CM | POA: Diagnosis not present

## 2022-10-08 DIAGNOSIS — M19011 Primary osteoarthritis, right shoulder: Secondary | ICD-10-CM | POA: Diagnosis not present

## 2022-10-08 DIAGNOSIS — R232 Flushing: Secondary | ICD-10-CM

## 2022-10-08 DIAGNOSIS — Z7901 Long term (current) use of anticoagulants: Secondary | ICD-10-CM

## 2022-10-08 DIAGNOSIS — I10 Essential (primary) hypertension: Secondary | ICD-10-CM

## 2022-10-08 DIAGNOSIS — Z8639 Personal history of other endocrine, nutritional and metabolic disease: Secondary | ICD-10-CM | POA: Diagnosis not present

## 2022-10-08 DIAGNOSIS — M5136 Other intervertebral disc degeneration, lumbar region: Secondary | ICD-10-CM | POA: Diagnosis not present

## 2022-10-08 DIAGNOSIS — M5030 Other cervical disc degeneration, unspecified cervical region: Secondary | ICD-10-CM | POA: Diagnosis not present

## 2022-10-08 DIAGNOSIS — M19041 Primary osteoarthritis, right hand: Secondary | ICD-10-CM | POA: Diagnosis not present

## 2022-10-08 NOTE — Patient Instructions (Addendum)
Hand Exercises Hand exercises can be helpful for almost anyone. These exercises can strengthen the hands, improve flexibility and movement, and increase blood flow to the hands. These results can make work and daily tasks easier. Hand exercises can be especially helpful for people who have joint pain from arthritis or have nerve damage from overuse (carpal tunnel syndrome). These exercises can also help people who have injured a hand. Exercises Most of these hand exercises are gentle stretching and motion exercises. It is usually safe to do them often throughout the day. Warming up your hands before exercise may help to reduce stiffness. You can do this with gentle massage or by placing your hands in warm water for 10-15 minutes. It is normal to feel some stretching, pulling, tightness, or mild discomfort as you begin new exercises. This will gradually improve. Stop an exercise right away if you feel sudden, severe pain or your pain gets worse. Ask your health care provider which exercises are best for you. Knuckle bend or "claw" fist  Stand or sit with your arm, hand, and all five fingers pointed straight up. Make sure to keep your wrist straight during the exercise. Gently bend your fingers down toward your palm until the tips of your fingers are touching the top of your palm. Keep your big knuckle straight and just bend the small knuckles in your fingers. Hold this position for __________ seconds. Straighten (extend) your fingers back to the starting position. Repeat this exercise 5-10 times with each hand. Full finger fist  Stand or sit with your arm, hand, and all five fingers pointed straight up. Make sure to keep your wrist straight during the exercise. Gently bend your fingers into your palm until the tips of your fingers are touching the middle of your palm. Hold this position for __________ seconds. Extend your fingers back to the starting position, stretching every joint fully. Repeat  this exercise 5-10 times with each hand. Straight fist Stand or sit with your arm, hand, and all five fingers pointed straight up. Make sure to keep your wrist straight during the exercise. Gently bend your fingers at the big knuckle, where your fingers meet your hand, and the middle knuckle. Keep the knuckle at the tips of your fingers straight and try to touch the bottom of your palm. Hold this position for __________ seconds. Extend your fingers back to the starting position, stretching every joint fully. Repeat this exercise 5-10 times with each hand. Tabletop  Stand or sit with your arm, hand, and all five fingers pointed straight up. Make sure to keep your wrist straight during the exercise. Gently bend your fingers at the big knuckle, where your fingers meet your hand, as far down as you can while keeping the small knuckles in your fingers straight. Think of forming a tabletop with your fingers. Hold this position for __________ seconds. Extend your fingers back to the starting position, stretching every joint fully. Repeat this exercise 5-10 times with each hand. Finger spread  Place your hand flat on a table with your palm facing down. Make sure your wrist stays straight as you do this exercise. Spread your fingers and thumb apart from each other as far as you can until you feel a gentle stretch. Hold this position for __________ seconds. Bring your fingers and thumb tight together again. Hold this position for __________ seconds. Repeat this exercise 5-10 times with each hand. Making circles  Stand or sit with your arm, hand, and all five fingers pointed   straight up. Make sure to keep your wrist straight during the exercise. Make a circle by touching the tip of your thumb to the tip of your index finger. Hold for __________ seconds. Then open your hand wide. Repeat this motion with your thumb and each finger on your hand. Repeat this exercise 5-10 times with each hand. Thumb  motion  Sit with your forearm resting on a table and your wrist straight. Your thumb should be facing up toward the ceiling. Keep your fingers relaxed as you move your thumb. Lift your thumb up as high as you can toward the ceiling. Hold for __________ seconds. Bend your thumb across your palm as far as you can, reaching the tip of your thumb for the small finger (pinkie) side of your palm. Hold for __________ seconds. Repeat this exercise 5-10 times with each hand. Grip strengthening  Hold a stress ball or other soft ball in the middle of your hand. Slowly increase the pressure, squeezing the ball as much as you can without causing pain. Think of bringing the tips of your fingers into the middle of your palm. All of your finger joints should bend when doing this exercise. Hold your squeeze for __________ seconds, then relax. Repeat this exercise 5-10 times with each hand. Contact a health care provider if: Your hand pain or discomfort gets much worse when you do an exercise. Your hand pain or discomfort does not improve within 2 hours after you exercise. If you have any of these problems, stop doing these exercises right away. Do not do them again unless your health care provider says that you can. Get help right away if: You develop sudden, severe hand pain or swelling. If this happens, stop doing these exercises right away. Do not do them again unless your health care provider says that you can. This information is not intended to replace advice given to you by your health care provider. Make sure you discuss any questions you have with your health care provider. Document Revised: 12/11/2020 Document Reviewed: 12/11/2020 Elsevier Patient Education  [place].  Osteoarthritis  Osteoarthritis is a type of arthritis. It refers to joint pain or joint disease. Osteoarthritis affects tissue that covers the ends of bones in joints (cartilage). Cartilage acts as a cushion between the  bones and helps them move smoothly. Osteoarthritis occurs when cartilage in the joints gets worn down. Osteoarthritis is sometimes called "wear and tear" arthritis. Osteoarthritis is the most common form of arthritis. It often occurs in older people. It is a condition that gets worse over time. The joints most often affected by this condition are in the fingers, toes, hips, knees, and spine, including the neck and lower back. What are the causes? This condition is caused by the wearing down of cartilage that covers the ends of bones. What increases the risk? The following factors may make you more likely to develop this condition: Being age 82 or older. Obesity. Overuse of joints. Past injury of a joint. Past surgery on a joint. Family history of osteoarthritis. What are the signs or symptoms? The main symptoms of this condition are pain, swelling, and stiffness in the joint. Other symptoms may include: An enlarged joint. More pain and further damage caused by small pieces of bone or cartilage that break off and float inside of the joint. Small deposits of bone (osteophytes) that grow on the edges of the joint. A grating or scraping feeling inside the joint when you move it. Popping or creaking  sounds when you move. Difficulty walking or exercising. An inability to grip items, twist your hand(s), or control the movements of your hands and fingers. How is this diagnosed? This condition may be diagnosed based on: Your medical history. A physical exam. Your symptoms. X-rays of the affected joint(s). Blood tests to rule out other types of arthritis. How is this treated? There is no cure for this condition, but treatment can help control pain and improve joint function. Treatment may include a combination of therapies, such as: Pain relief techniques, such as: Applying heat and cold to the joint. Massage. A form of talk therapy called cognitive behavioral therapy (CBT). This therapy helps  you set goals and follow up on the changes that you make. Medicines for pain and inflammation. The medicines can be taken by mouth or applied to the skin. They include: NSAIDs, such as ibuprofen. Prescription medicines. Strong anti-inflammatory medicines (corticosteroids). Certain nutritional supplements. A prescribed exercise program. You may work with a physical therapist. Assistive devices, such as a brace, wrap, splint, specialized glove, or cane. A weight control plan. Surgery, such as: An osteotomy. This is done to reposition the bones and relieve pain or to remove loose pieces of bone and cartilage. Joint replacement surgery. You may need this surgery if you have advanced osteoarthritis. Follow these instructions at home: Activity Rest your affected joints as told by your health care provider. Exercise as told by your health care provider. He or she may recommend specific types of exercise, such as: Strengthening exercises. These are done to strengthen the muscles that support joints affected by arthritis. Aerobic activities. These are exercises, such as brisk walking or water aerobics, that increase your heart rate. Range-of-motion activities. These help your joints move more easily. Balance and agility exercises. Managing pain, stiffness, and swelling     If directed, apply heat to the affected area as often as told by your health care provider. Use the heat source that your health care provider recommends, such as a moist heat pack or a heating pad. If you have a removable assistive device, remove it as told by your health care provider. Place a towel between your skin and the heat source. If your health care provider tells you to keep the assistive device on while you apply heat, place a towel between the assistive device and the heat source. Leave the heat on for 20-30 minutes. Remove the heat if your skin turns bright red. This is especially important if you are unable to  feel pain, heat, or cold. You may have a greater risk of getting burned. If directed, put ice on the affected area. To do this: If you have a removable assistive device, remove it as told by your health care provider. Put ice in a plastic bag. Place a towel between your skin and the bag. If your health care provider tells you to keep the assistive device on during icing, place a towel between the assistive device and the bag. Leave the ice on for 20 minutes, 2-3 times a day. Move your fingers or toes often to reduce stiffness and swelling. Raise (elevate) the injured area above the level of your heart while you are sitting or lying down. General instructions Take over-the-counter and prescription medicines only as told by your health care provider. Maintain a healthy weight. Follow instructions from your health care provider for weight control. Do not use any products that contain nicotine or tobacco, such as cigarettes, e-cigarettes, and chewing tobacco. If you need  help quitting, ask your health care provider. Use assistive devices as told by your health care provider. Keep all follow-up visits as told by your health care provider. This is important. Where to find more information [employer] of Arthritis and Musculoskeletal and Skin Diseases: www.niams.[employer].es [employer] on Aging: http://kim-miller.com/ American College of Rheumatology: www.rheumatology.org Contact a health care provider if: You have redness, swelling, or a feeling of warmth in a joint that gets worse. You have a fever along with joint or muscle aches. You develop a rash. You have trouble doing your normal activities. Get help right away if: You have pain that gets worse and is not relieved by pain medicine. Summary Osteoarthritis is a type of arthritis that affects tissue covering the ends of bones in joints (cartilage). This condition is caused by the wearing down of cartilage that covers the ends of  bones. The main symptom of this condition is pain, swelling, and stiffness in the joint. There is no cure for this condition, but treatment can help control pain and improve joint function. This information is not intended to replace advice given to you by your health care provider. Make sure you discuss any questions you have with your health care provider. Document Revised: 02/23/2022 Document Reviewed: 08/20/2019 Elsevier Patient Education  [place]. Back Exercises The following exercises strengthen the muscles that help to support the trunk (torso) and back. They also help to keep the lower back flexible. Doing these exercises can help to prevent or lessen existing low back pain. If you have back pain or discomfort, try doing these exercises 2-3 times each day or as told by your health care provider. As your pain improves, do them once each day, but increase the number of times that you repeat the steps for each exercise (do more repetitions). To prevent the recurrence of back pain, continue to do these exercises once each day or as told by your health care provider. Do exercises exactly as told by your health care provider and adjust them as directed. It is normal to feel mild stretching, pulling, tightness, or discomfort as you do these exercises, but you should stop right away if you feel sudden pain or your pain gets worse. Exercises Single knee to chest Repeat these steps 3-5 times for each leg: Lie on your back on a firm bed or the floor with your legs extended. Bring one knee to your chest. Your other leg should stay extended and in contact with the floor. Hold your knee in place by grabbing your knee or thigh with both hands and hold. Pull on your knee until you feel a gentle stretch in your lower back or buttocks. Hold the stretch for 10-30 seconds. Slowly release and straighten your leg.  Pelvic tilt Repeat these steps 5-10 times: Lie on your back on a firm bed or the  floor with your legs extended. Bend your knees so they are pointing toward the ceiling and your feet are flat on the floor. Tighten your lower abdominal muscles to press your lower back against the floor. This motion will tilt your pelvis so your tailbone points up toward the ceiling instead of pointing to your feet or the floor. With gentle tension and even breathing, hold this position for 5-10 seconds.  Cat-cow Repeat these steps until your lower back becomes more flexible: Get into a hands-and-knees position on a firm bed or the floor. Keep your hands under your shoulders, and keep your knees under your hips. You  may place padding under your knees for comfort. Let your head hang down toward your chest. Contract your abdominal muscles and point your tailbone toward the floor so your lower back becomes rounded like the back of a cat. Hold this position for 5 seconds. Slowly lift your head, let your abdominal muscles relax, and point your tailbone up toward the ceiling so your back forms a sagging arch like the back of a cow. Hold this position for 5 seconds.  Press-ups Repeat these steps 5-10 times: Lie on your abdomen (face-down) on a firm bed or the floor. Place your palms near your head, about shoulder-width apart. Keeping your back as relaxed as possible and keeping your hips on the floor, slowly straighten your arms to raise the top half of your body and lift your shoulders. Do not use your back muscles to raise your upper torso. You may adjust the placement of your hands to make yourself more comfortable. Hold this position for 5 seconds while you keep your back relaxed. Slowly return to lying flat on the floor.  Bridges Repeat these steps 10 times: Lie on your back on a firm bed or the floor. Bend your knees so they are pointing toward the ceiling and your feet are flat on the floor. Your arms should be flat at your sides, next to your body. Tighten your buttocks muscles and lift  your buttocks off the floor until your waist is at almost the same height as your knees. You should feel the muscles working in your buttocks and the back of your thighs. If you do not feel these muscles, slide your feet 1-2 inches (2.5-5 cm) farther away from your buttocks. Hold this position for 3-5 seconds. Slowly lower your hips to the starting position, and allow your buttocks muscles to relax completely. If this exercise is too easy, try doing it with your arms crossed over your chest. Abdominal crunches Repeat these steps 5-10 times: Lie on your back on a firm bed or the floor with your legs extended. Bend your knees so they are pointing toward the ceiling and your feet are flat on the floor. Cross your arms over your chest. Tip your chin slightly toward your chest without bending your neck. Tighten your abdominal muscles and slowly raise your torso high enough to lift your shoulder blades a tiny bit off the floor. Avoid raising your torso higher than that because it can put too much stress on your lower back and does not help to strengthen your abdominal muscles. Slowly return to your starting position.  Back lifts Repeat these steps 5-10 times: Lie on your abdomen (face-down) with your arms at your sides, and rest your forehead on the floor. Tighten the muscles in your legs and your buttocks. Slowly lift your chest off the floor while you keep your hips pressed to the floor. Keep the back of your head in line with the curve in your back. Your eyes should be looking at the floor. Hold this position for 3-5 seconds. Slowly return to your starting position.  Contact a health care provider if: Your back pain or discomfort gets much worse when you do an exercise. Your worsening back pain or discomfort does not lessen within 2 hours after you exercise. If you have any of these problems, stop doing these exercises right away. Do not do them again unless your health care provider says that  you can. Get help right away if: You develop sudden, severe back pain. If this  happens, stop doing the exercises right away. Do not do them again unless your health care provider says that you can. This information is not intended to replace advice given to you by your health care provider. Make sure you discuss any questions you have with your health care provider. Document Revised: 02/17/2021 Document Reviewed: 11/05/2020 Elsevier Patient Education  [place]. Cervical Strain and Sprain Rehab Ask your health care provider which exercises are safe for you. Do exercises exactly as told by your health care provider and adjust them as directed. It is normal to feel mild stretching, pulling, tightness, or discomfort as you do these exercises. Stop right away if you feel sudden pain or your pain gets worse. Do not begin these exercises until told by your health care provider. Stretching and range-of-motion exercises Cervical side bending  Using good posture, sit on a stable chair or stand up. Without moving your shoulders, slowly tilt your left / right ear to your shoulder until you feel a stretch in the neck muscles on the opposite side. You should be looking straight ahead. Hold for __________ seconds. Repeat with the other side of your neck. Repeat __________ times. Complete this exercise __________ times a day. Cervical rotation  Using good posture, sit on a stable chair or stand up. Slowly turn your head to the side as if you are looking over your left / right shoulder. Keep your eyes level with the ground. Stop when you feel a stretch along the side and the back of your neck. Hold for __________ seconds. Repeat this by turning to your other side. Repeat __________ times. Complete this exercise __________ times a day. Thoracic extension and pectoral stretch  Roll a towel or a small blanket so it is about 4 inches (10 cm) in diameter. Lie down on your back on a firm surface. Put  the towel in the middle of your back across your spine. It should not be under your shoulder blades. Put your hands behind your head and let your elbows fall out to your sides. Hold for __________ seconds. Repeat __________ times. Complete this exercise __________ times a day. Strengthening exercises Upper cervical flexion  Lie on your back with a thin pillow behind your head or a small, rolled-up towel under your neck. Gently tuck your chin toward your chest and nod your head down to look toward your feet. Do not lift your head off the pillow. Hold for __________ seconds. Release the tension slowly. Relax your neck muscles completely before you repeat this exercise. Repeat __________ times. Complete this exercise __________ times a day. Cervical extension  Stand about 6 inches (15 cm) away from a wall, with your back facing the wall. Place a soft object, about 6-8 inches (15-20 cm) in diameter, between the back of your head and the wall. A soft object could be a small pillow, a ball, or a folded towel. Gently tilt your head back and press into the soft object. Keep your jaw and forehead relaxed. Hold for __________ seconds. Release the tension slowly. Relax your neck muscles completely before you repeat this exercise. Repeat __________ times. Complete this exercise __________ times a day. Posture and body mechanics Body mechanics refer to the movements and positions of your body while you do your daily activities. Posture is part of body mechanics. Good posture and healthy body mechanics can help to relieve stress in your body's tissues and joints. Good posture means that your spine is in its natural S-curve position (your  spine is neutral), your shoulders are pulled back slightly, and your head is not tipped forward. The following are general guidelines for using improved posture and body mechanics in your everyday activities. Sitting  When sitting, keep your spine neutral and keep your  feet flat on the floor. Use a footrest, if needed, and keep your thighs parallel to the floor. Avoid rounding your shoulders. Avoid tilting your head forward. When working at a desk or a computer, keep your desk at a height where your hands are slightly lower than your elbows. Slide your chair under your desk so you are close enough to maintain good posture. When working at a computer, place your monitor at a height where you are looking straight ahead and you do not have to tilt your head forward or downward to look at the screen. Standing  When standing, keep your spine neutral and keep your feet about hip-width apart. Keep a slight bend in your knees. Your ears, shoulders, and hips should line up. When you do a task in which you stand in one place for a long time, place one foot up on a stable object that is 2-4 inches (5-10 cm) high, such as a footstool. This helps keep your spine neutral. Resting When lying down and resting, avoid positions that are most painful for you. Try to support your neck in a neutral position. You can use a contour pillow or a small rolled-up towel. Your pillow should support your neck but not push on it. This information is not intended to replace advice given to you by your health care provider. Make sure you discuss any questions you have with your health care provider. Document Revised: 03/15/2022 Document Reviewed: 03/15/2022 Elsevier Patient Education  [place].

## 2022-10-11 DIAGNOSIS — M9904 Segmental and somatic dysfunction of sacral region: Secondary | ICD-10-CM | POA: Diagnosis not present

## 2022-10-11 DIAGNOSIS — M9905 Segmental and somatic dysfunction of pelvic region: Secondary | ICD-10-CM | POA: Diagnosis not present

## 2022-10-11 DIAGNOSIS — M9901 Segmental and somatic dysfunction of cervical region: Secondary | ICD-10-CM | POA: Diagnosis not present

## 2022-10-11 DIAGNOSIS — M9903 Segmental and somatic dysfunction of lumbar region: Secondary | ICD-10-CM | POA: Diagnosis not present

## 2022-10-12 LAB — RHEUMATOID FACTOR: Rheumatoid fact SerPl-aCnc: <14 IU/mL (ref <14)

## 2022-10-12 LAB — URIC ACID: Uric Acid, Serum: 6.1 mg/dL (ref 2.5–7.0)

## 2022-10-12 LAB — CYCLIC CITRUL PEPTIDE ANTIBODY, IGG: Cyclic Citrullin Peptide Ab: <16 UNITS

## 2022-10-12 NOTE — Progress Notes (Signed)
I will discuss results at the follow-up visit.

## 2022-10-20 DIAGNOSIS — M9905 Segmental and somatic dysfunction of pelvic region: Secondary | ICD-10-CM | POA: Diagnosis not present

## 2022-10-20 DIAGNOSIS — M9901 Segmental and somatic dysfunction of cervical region: Secondary | ICD-10-CM | POA: Diagnosis not present

## 2022-10-20 DIAGNOSIS — M9904 Segmental and somatic dysfunction of sacral region: Secondary | ICD-10-CM | POA: Diagnosis not present

## 2022-10-20 DIAGNOSIS — M9903 Segmental and somatic dysfunction of lumbar region: Secondary | ICD-10-CM | POA: Diagnosis not present

## 2022-10-25 DIAGNOSIS — M9903 Segmental and somatic dysfunction of lumbar region: Secondary | ICD-10-CM | POA: Diagnosis not present

## 2022-10-25 DIAGNOSIS — M9901 Segmental and somatic dysfunction of cervical region: Secondary | ICD-10-CM | POA: Diagnosis not present

## 2022-10-25 DIAGNOSIS — M9904 Segmental and somatic dysfunction of sacral region: Secondary | ICD-10-CM | POA: Diagnosis not present

## 2022-10-25 DIAGNOSIS — M9905 Segmental and somatic dysfunction of pelvic region: Secondary | ICD-10-CM | POA: Diagnosis not present

## 2022-10-26 NOTE — Progress Notes (Deleted)
Office Visit Note  Patient: Michele Monroe             Date of Birth: Nov 04, 1945           MRN: [id number]             PCP: Burnis Medin, MD Referring: Burnis Medin, MD Visit Date: 11/09/2022 Occupation: @GUAROCC$ @  Subjective:  No chief complaint on file.   History of Present Illness: Michele Monroe is a 77 y.o. female ***     Activities of Daily Living:  Patient reports morning stiffness for *** {minute/hour:19697}.   Patient {ACTIONS;DENIES/REPORTS:21021675::"Denies"} nocturnal pain.  Difficulty dressing/grooming: {ACTIONS;DENIES/REPORTS:21021675::"Denies"} Difficulty climbing stairs: {ACTIONS;DENIES/REPORTS:21021675::"Denies"} Difficulty getting out of chair: {ACTIONS;DENIES/REPORTS:21021675::"Denies"} Difficulty using hands for taps, buttons, cutlery, and/or writing: {ACTIONS;DENIES/REPORTS:21021675::"Denies"}  No Rheumatology ROS completed.   PMFS History:  Patient Active Problem List   Diagnosis Date Noted   Medication management 05/12/2022   Anticoagulant long-term use 05/12/2022   History of DVT of lower extremity 05/12/2022   Dizziness 05/05/2019   Varicose veins with pain 03/06/2015   Renal lesion 08/10/2014   Recurrent headache 08/10/2014   Hyperlipidemia 08/10/2014   Essential hypertension 08/10/2014   Neck pain on left side 02/08/2014   Allergic rhinitis ? 02/08/2014   Varicose veins of bilateral lower extremities with other complications 0000000   Vasomotor flushing 06/19/2013   Varicose veins 08/13/2012   Mild anemia 08/13/2012   Recurrent acute sinusitis 08/13/2012   Bilateral neck pain 08/13/2012   Stress headaches 08/13/2012   Coughing up blood 08/13/2012   Constipation 07/25/2012   Subcutaneous nodule 10/06/2011   VITAMIN D DEFICIENCY 06/30/2010   HYPERLIPIDEMIA 06/30/2010   HYPERTENSION 06/30/2010   ALLERGIC RHINITIS 06/30/2010   GERD 06/30/2010   IBS 06/30/2010   Headache 06/30/2010    Past Medical History:  Diagnosis Date    Adenomatous colon polyp    ALLERGIC RHINITIS 06/30/2010   perennial   Allergy    seasonal allergies   Anxiety    hx of   Arthritis    bilateral hands (R>L)   Coughing up blood 08/13/2012   piece of tissue? will send to path poss from upper airway sinusitis  Final path was "abscess"    Depression    hx of   GERD 06/30/2010   on meds   Headache(784.0) 06/30/2010   migraines   Hepatic steatosis    HYPERLIPIDEMIA 06/30/2010   on meds   HYPERTENSION 06/30/2010   on meds   IBS 06/30/2010   Internal hemorrhoids    Renal lesion    Varicose veins    VITAMIN D DEFICIENCY 06/30/2010    Family History  Problem Relation Age of Onset   Colon cancer Mother        age 54   Colon polyps Mother    Breast cancer Cousin    Arthritis Brother    Other Brother    Colon polyps Brother    Esophageal cancer Neg Hx    Stomach cancer Neg Hx    Rectal cancer Neg Hx    Past Surgical History:  Procedure Laterality Date   BREAST CYST ASPIRATION     BREAST SURGERY     needle bx left breast   COLONOSCOPY  2016   KN-prep good-TA   feet     hammer toe sx   POLYPECTOMY  2016   TA   ROTATOR CUFF REPAIR Right    varicose veins     Social History   Social History  Narrative   Widower  Husband died 87  PTSD in his sleep   Former smoker in her 68's   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       Lives in [place]      3 cups of caffinated coffee per day. Denies soda/tea   Right handed   Immunization History  Administered Date(s) Administered   Fluad Quad(high Dose 65+) 05/02/2019   Influenza Split 06/05/2012, 05/15/2013   Influenza, High Dose Seasonal PF 04/20/2016, 06/23/2017, 06/19/2018   Influenza,inj,Quad PF,6+ Mos 06/03/2015   Influenza-Unspecified 06/06/2014   Pneumococcal Conjugate-13 08/07/2012   Pneumococcal Polysaccharide-23 09/24/2012   Td 09/07/2007   Zoster, Live 09/24/2012     Objective: Vital  Signs: There were no vitals taken for this visit.   Physical Exam   Musculoskeletal Exam: ***  CDAI Exam: CDAI Score: -- Patient Global: --; Provider Global: -- Swollen: --; Tender: -- Joint Exam 11/09/2022   No joint exam has been documented for this visit   There is currently no information documented on the homunculus. Go to the Rheumatology activity and complete the homunculus joint exam.  Investigation: No additional findings.  Imaging: XR Hand 2 View Right  Result Date: 10/08/2022 CMC, PIP and DIP narrowing was noted.  No MCP, intercarpal or radiocarpal joint space narrowing was noted.  No erosive changes were noted.  No chondrocalcinosis was noted. Impression: These findings are consistent with osteoarthritis of the hand.   Recent Labs: Lab Results  Component Value Date   WBC 10.3 10/16/2021   HGB 12.4 10/16/2021   PLT 175 10/16/2021   NA 137 10/16/2021   K 3.9 10/16/2021   CL 103 10/16/2021   CO2 26 10/16/2021   GLUCOSE 194 (H) 10/16/2021   BUN 7 (L) 10/16/2021   CREATININE 0.74 10/16/2021   BILITOT 0.7 10/15/2021   ALKPHOS 63 10/15/2021   AST 20 10/15/2021   ALT 21 10/15/2021   PROT 7.3 10/15/2021   ALBUMIN 3.9 10/15/2021   CALCIUM 9.8 10/16/2021   October 08, 2022 RF negative, anti-CCP negative, uric acid 6.1  Speciality Comments: No specialty comments available.  Procedures:  No procedures performed Allergies: Patient has no known allergies.   Assessment / Plan:     Visit Diagnoses: No diagnosis found.  Orders: No orders of the defined types were placed in this encounter.  No orders of the defined types were placed in this encounter.   Face-to-face time spent with patient was *** minutes. Greater than 50% of time was spent in counseling and coordination of care.  Follow-Up Instructions: No follow-ups on file.   Bo Merino, MD  Note - This record has been created using Editor, commissioning.  Chart creation errors have been sought, but  may not always  have been located. Such creation errors do not reflect on  the standard of medical care.

## 2022-10-28 DIAGNOSIS — A6920 Lyme disease, unspecified: Secondary | ICD-10-CM | POA: Diagnosis not present

## 2022-10-29 ENCOUNTER — Telehealth: Payer: Self-pay | Admitting: Internal Medicine

## 2022-10-29 NOTE — Telephone Encounter (Signed)
FYI Pt is calling to let md know she went to [facility] yesterday due to tick bite and does have infection and she is on abx

## 2022-11-01 ENCOUNTER — Telehealth: Payer: Self-pay | Admitting: Internal Medicine

## 2022-11-01 DIAGNOSIS — M9905 Segmental and somatic dysfunction of pelvic region: Secondary | ICD-10-CM | POA: Diagnosis not present

## 2022-11-01 DIAGNOSIS — M9901 Segmental and somatic dysfunction of cervical region: Secondary | ICD-10-CM | POA: Diagnosis not present

## 2022-11-01 DIAGNOSIS — M9904 Segmental and somatic dysfunction of sacral region: Secondary | ICD-10-CM | POA: Diagnosis not present

## 2022-11-01 DIAGNOSIS — M9903 Segmental and somatic dysfunction of lumbar region: Secondary | ICD-10-CM | POA: Diagnosis not present

## 2022-11-01 NOTE — Telephone Encounter (Signed)
Called patient to schedule Medicare Annual Wellness Visit (AWV). Left message for patient to call back and schedule Medicare Annual Wellness Visit (AWV).  Last date of AWV: 10/27/21  Please schedule an appointment at any time with [facility] beverly or hannah kim .  If any questions, please contact me at [phone].  Thank you ,  Barkley Boards AWV direct phone # [phone]

## 2022-11-04 ENCOUNTER — Other Ambulatory Visit: Payer: Self-pay | Admitting: Internal Medicine

## 2022-11-04 DIAGNOSIS — H2513 Age-related nuclear cataract, bilateral: Secondary | ICD-10-CM | POA: Diagnosis not present

## 2022-11-09 ENCOUNTER — Ambulatory Visit: Payer: Medicare Other | Admitting: Rheumatology

## 2022-11-09 DIAGNOSIS — Z86718 Personal history of other venous thrombosis and embolism: Secondary | ICD-10-CM

## 2022-11-09 DIAGNOSIS — M5030 Other cervical disc degeneration, unspecified cervical region: Secondary | ICD-10-CM

## 2022-11-09 DIAGNOSIS — M5136 Other intervertebral disc degeneration, lumbar region: Secondary | ICD-10-CM

## 2022-11-09 DIAGNOSIS — Z8639 Personal history of other endocrine, nutritional and metabolic disease: Secondary | ICD-10-CM

## 2022-11-09 DIAGNOSIS — M19041 Primary osteoarthritis, right hand: Secondary | ICD-10-CM

## 2022-11-09 DIAGNOSIS — I83893 Varicose veins of bilateral lower extremities with other complications: Secondary | ICD-10-CM

## 2022-11-09 DIAGNOSIS — M19011 Primary osteoarthritis, right shoulder: Secondary | ICD-10-CM

## 2022-11-09 DIAGNOSIS — M2550 Pain in unspecified joint: Secondary | ICD-10-CM

## 2022-11-09 DIAGNOSIS — Z8719 Personal history of other diseases of the digestive system: Secondary | ICD-10-CM

## 2022-11-09 DIAGNOSIS — I10 Essential (primary) hypertension: Secondary | ICD-10-CM

## 2022-11-10 DIAGNOSIS — M9904 Segmental and somatic dysfunction of sacral region: Secondary | ICD-10-CM | POA: Diagnosis not present

## 2022-11-10 DIAGNOSIS — M9901 Segmental and somatic dysfunction of cervical region: Secondary | ICD-10-CM | POA: Diagnosis not present

## 2022-11-10 DIAGNOSIS — M9903 Segmental and somatic dysfunction of lumbar region: Secondary | ICD-10-CM | POA: Diagnosis not present

## 2022-11-10 DIAGNOSIS — M9905 Segmental and somatic dysfunction of pelvic region: Secondary | ICD-10-CM | POA: Diagnosis not present

## 2022-11-12 ENCOUNTER — Other Ambulatory Visit: Payer: Self-pay | Admitting: Gastroenterology

## 2022-11-15 DIAGNOSIS — M9904 Segmental and somatic dysfunction of sacral region: Secondary | ICD-10-CM | POA: Diagnosis not present

## 2022-11-15 DIAGNOSIS — M9905 Segmental and somatic dysfunction of pelvic region: Secondary | ICD-10-CM | POA: Diagnosis not present

## 2022-11-15 DIAGNOSIS — M9901 Segmental and somatic dysfunction of cervical region: Secondary | ICD-10-CM | POA: Diagnosis not present

## 2022-11-15 DIAGNOSIS — M9903 Segmental and somatic dysfunction of lumbar region: Secondary | ICD-10-CM | POA: Diagnosis not present

## 2022-11-17 DIAGNOSIS — M9903 Segmental and somatic dysfunction of lumbar region: Secondary | ICD-10-CM | POA: Diagnosis not present

## 2022-11-17 DIAGNOSIS — M9901 Segmental and somatic dysfunction of cervical region: Secondary | ICD-10-CM | POA: Diagnosis not present

## 2022-11-17 DIAGNOSIS — M9904 Segmental and somatic dysfunction of sacral region: Secondary | ICD-10-CM | POA: Diagnosis not present

## 2022-11-17 DIAGNOSIS — M9905 Segmental and somatic dysfunction of pelvic region: Secondary | ICD-10-CM | POA: Diagnosis not present

## 2022-11-22 DIAGNOSIS — M9905 Segmental and somatic dysfunction of pelvic region: Secondary | ICD-10-CM | POA: Diagnosis not present

## 2022-11-22 DIAGNOSIS — M9903 Segmental and somatic dysfunction of lumbar region: Secondary | ICD-10-CM | POA: Diagnosis not present

## 2022-11-22 DIAGNOSIS — M9904 Segmental and somatic dysfunction of sacral region: Secondary | ICD-10-CM | POA: Diagnosis not present

## 2022-11-22 DIAGNOSIS — M9901 Segmental and somatic dysfunction of cervical region: Secondary | ICD-10-CM | POA: Diagnosis not present

## 2022-11-22 NOTE — Progress Notes (Unsigned)
No chief complaint on file.   HPI: Michele Monroe 77 y.o. come in for Chronic disease management   Rx as for lym for em  February    walk in [place]   ,  given doxy?  Saw dr [name] Kos also  HLD  [place]  Sees Dr.   Lenna Sciara for headaches  ROS: See pertinent positives and negatives per HPI.  Past Medical History:  Diagnosis Date   Adenomatous colon polyp    ALLERGIC RHINITIS 06/30/2010   perennial   Allergy    seasonal allergies   Anxiety    hx of   Arthritis    bilateral hands (R>L)   Coughing up blood 08/13/2012   piece of tissue? will send to path poss from upper airway sinusitis  Final path was "abscess"    Depression    hx of   GERD 06/30/2010   on meds   Headache(784.0) 06/30/2010   migraines   Hepatic steatosis    HYPERLIPIDEMIA 06/30/2010   on meds   HYPERTENSION 06/30/2010   on meds   IBS 06/30/2010   Internal hemorrhoids    Renal lesion    Varicose veins    VITAMIN D DEFICIENCY 06/30/2010    Family History  Problem Relation Age of Onset   Colon cancer Mother        age 3   Colon polyps Mother    Breast cancer Cousin    Arthritis Brother    Other Brother    Colon polyps Brother    Esophageal cancer Neg Hx    Stomach cancer Neg Hx    Rectal cancer Neg Hx     Social History   Socioeconomic History   Marital status: Widowed    Spouse name: Not on file   Number of children: 2   Years of education: Not on file   Highest education level: Not on file  Occupational History   Occupation: retired    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       Lives in [place]      3 cups of caffinated coffee per day. Denies soda/tea   Right handed   Social Determinants of Health   Financial Resource Strain: Low Risk  (10/27/2021)   Overall Financial Resource Strain (CARDIA)    Difficulty of Paying Living Expenses: Not hard at all  Food Insecurity: No Food Insecurity (10/27/2021)   Hunger Vital Sign    Worried About Running Out of Food in the Last Year: Never true    Ran Out of Food in the Last Year: Never true  Transportation Needs: No Transportation Needs (10/27/2021)   PRAPARE - Transportation    Lack of Transportation (  Medical): No    Lack of Transportation (Non-Medical): No  Physical Activity: Inactive (10/27/2021)   Exercise Vital Sign    Days of Exercise per Week: 0 days    Minutes of Exercise per Session: 0 min  Stress: Stress Concern Present (10/27/2021)   [place]    Feeling of Stress : To some extent  Social Connections: Moderately Isolated (10/21/2020)   Social Connection and Isolation Panel [NHANES]    Frequency of Communication with Friends and Family: More than three times a week    Frequency of Social Gatherings with Friends and Family: More than three times a week    Attends Religious Services: 1 to 4 times per year    Active Member of [employer] or Organizations: No    Attends Archivist Meetings: Never    Marital Status: Widowed    Outpatient Medications Prior to Visit  Medication Sig Dispense Refill   acetaminophen (TYLENOL) 325 MG tablet Take 650 mg by mouth every 6 (six) hours as needed.     albuterol (VENTOLIN HFA) 108 (90 Base) MCG/ACT inhaler Inhale 2 puffs by mouth every 6 hours as needed. LAST refill pt needs appt. 8.5 each 11   Ascorbic Acid (VITAMIN C)  1000 MG tablet Take 1,000 mg by mouth daily. (Patient not taking: Reported on 10/08/2022)     atenolol (TENORMIN) 25 MG tablet Take 1 tablet by mouth every day 30 tablet 11   atorvastatin (LIPITOR) 20 MG tablet Take 1 tablet by mouth every day 30 tablet 1   carbamide peroxide (DEBROX) 6.5 % OTIC solution Place 5 drops into both ears 2 (two) times daily. (Patient not taking: Reported on 05/12/2022) 15 mL 0   cholecalciferol (VITAMIN D3) 25 MCG (1000 UNIT) tablet Take 1,000 Units by mouth daily.     fluocinonide-emollient (LIDEX-E) 0.05 % cream Apply 1 Application topically 2 (two) times daily. For itchy bug bites not on face 15 g 1   hyoscyamine (ANASPAZ) 0.125 MG TBDP disintergrating tablet take 1 every morning under tongue 30 tablet 1   hyoscyamine (LEVSIN SL) 0.125 MG SL tablet Dissolve 1 tablet under the tongue every morning as needed 30 tablet 11   omeprazole (PRILOSEC) 20 MG capsule Take 20 mg by mouth 2 (two) times daily before a meal.     ondansetron (ZOFRAN-ODT) 4 MG disintegrating tablet TAKE 1 TABLET BY MOUTH EVERY 8 HOURS AS NEEDED FOR NAUSEA AND VOMITING 30 tablet 0   Probiotic Product (PROBIOTIC ACIDOPHILUS BEADS PO) Take by mouth.     rizatriptan (MAXALT) 10 MG tablet Take 1 tablet (10 mg total) by mouth as needed for migraine (May repeat after 2 hours.  Maximum 2 tablets in 24 hours.). May repeat in 2 hours if needed 11 tablet 5   tiZANidine (ZANAFLEX) 4 MG tablet Take one-half to one tablet by mouth at bedtime as needed for neck pain 30 tablet 1   valACYclovir (VALTREX) 1000 MG tablet Take 2 tablets (2,000 mg total) by mouth 2 (two) times daily. (Patient not taking: Reported on 05/12/2022) 30 tablet 1   vitamin B-12 (CYANOCOBALAMIN) 500 MCG tablet Take 500 mcg by mouth daily.     Zinc 30 MG CAPS Take by mouth.     No facility-administered medications prior to visit.     EXAM:  There were no vitals taken for this visit.  There is no height or weight on file to calculate  BMI.  GENERAL: vitals reviewed and  listed above, alert, oriented, appears well hydrated and in no acute distress HEENT: atraumatic, conjunctiva  clear, no obvious abnormalities on inspection of external nose and ears OP : no lesion edema or exudate  NECK: no obvious masses on inspection palpation  LUNGS: clear to auscultation bilaterally, no wheezes, rales or rhonchi, good air movement CV: HRRR, no clubbing cyanosis or  peripheral edema nl cap refill  MS: moves all extremities without noticeable focal  abnormality PSYCH: pleasant and cooperative, no obvious depression or anxiety Lab Results  Component Value Date   WBC 10.3 10/16/2021   HGB 12.4 10/16/2021   HCT 38.3 10/16/2021   PLT 175 10/16/2021   GLUCOSE 194 (H) 10/16/2021   CHOL 162 04/08/2021   TRIG 185.0 (H) 04/08/2021   HDL 49.90 04/08/2021   LDLCALC 75 04/08/2021   ALT 21 10/15/2021   AST 20 10/15/2021   NA 137 10/16/2021   K 3.9 10/16/2021   CL 103 10/16/2021   CREATININE 0.74 10/16/2021   BUN 7 (L) 10/16/2021   CO2 26 10/16/2021   TSH 1.85 04/08/2021   BP Readings from Last 3 Encounters:  10/08/22 132/83  03/10/22 126/80  01/19/22 124/70    ASSESSMENT AND PLAN:  Discussed the following assessment and plan:  Medication management  Essential hypertension  Hyperlipidemia, unspecified hyperlipidemia type  -Patient advised to return or notify health care team  if  new concerns arise.  There are no Patient Instructions on file for this visit.   [name]. Ugo Thoma M.D.

## 2022-11-23 ENCOUNTER — Telehealth (INDEPENDENT_AMBULATORY_CARE_PROVIDER_SITE_OTHER): Payer: Medicare Other | Admitting: Internal Medicine

## 2022-11-23 ENCOUNTER — Encounter: Payer: Self-pay | Admitting: Internal Medicine

## 2022-11-23 DIAGNOSIS — I10 Essential (primary) hypertension: Secondary | ICD-10-CM

## 2022-11-23 DIAGNOSIS — E785 Hyperlipidemia, unspecified: Secondary | ICD-10-CM | POA: Diagnosis not present

## 2022-11-23 DIAGNOSIS — K589 Irritable bowel syndrome without diarrhea: Secondary | ICD-10-CM

## 2022-11-23 DIAGNOSIS — R519 Headache, unspecified: Secondary | ICD-10-CM

## 2022-11-23 DIAGNOSIS — Z79899 Other long term (current) drug therapy: Secondary | ICD-10-CM

## 2022-11-23 MED ORDER — ATORVASTATIN CALCIUM 20 MG PO TABS: 20.0000 mg | ORAL_TABLET | Freq: Every day | ORAL | 1 refills | Status: DC

## 2022-11-29 ENCOUNTER — Telehealth: Payer: Self-pay | Admitting: Internal Medicine

## 2022-11-29 DIAGNOSIS — M9905 Segmental and somatic dysfunction of pelvic region: Secondary | ICD-10-CM | POA: Diagnosis not present

## 2022-11-29 DIAGNOSIS — M9903 Segmental and somatic dysfunction of lumbar region: Secondary | ICD-10-CM | POA: Diagnosis not present

## 2022-11-29 DIAGNOSIS — M9904 Segmental and somatic dysfunction of sacral region: Secondary | ICD-10-CM | POA: Diagnosis not present

## 2022-11-29 DIAGNOSIS — M9901 Segmental and somatic dysfunction of cervical region: Secondary | ICD-10-CM | POA: Diagnosis not present

## 2022-11-29 NOTE — Telephone Encounter (Signed)
Pt called to say she has a rash on her back that is extremely itchy and spreading.  Pt is wondering if she needs a referral to see a Dermatologist? Pt has [facility]. Pt informed MD is OOO on Mondays. Please Advise.

## 2022-11-29 NOTE — Telephone Encounter (Signed)
See 11/23/22 video visit. Please advise

## 2022-11-30 DIAGNOSIS — L241 Irritant contact dermatitis due to oils and greases: Secondary | ICD-10-CM | POA: Diagnosis not present

## 2022-11-30 NOTE — Telephone Encounter (Signed)
Sounds like a new problem  Advise OV in person ( any provider available ) Appts with dermatology may take 3+ months wait.

## 2022-11-30 NOTE — Telephone Encounter (Signed)
Called and spoke with patient,  message given.  Patient stated that at this time the rash has gotten a little worse and she is going to go to [facility] in [place] on [address].

## 2022-12-06 DIAGNOSIS — M9905 Segmental and somatic dysfunction of pelvic region: Secondary | ICD-10-CM | POA: Diagnosis not present

## 2022-12-06 DIAGNOSIS — M9904 Segmental and somatic dysfunction of sacral region: Secondary | ICD-10-CM | POA: Diagnosis not present

## 2022-12-06 DIAGNOSIS — M9901 Segmental and somatic dysfunction of cervical region: Secondary | ICD-10-CM | POA: Diagnosis not present

## 2022-12-06 DIAGNOSIS — M9903 Segmental and somatic dysfunction of lumbar region: Secondary | ICD-10-CM | POA: Diagnosis not present

## 2022-12-09 ENCOUNTER — Other Ambulatory Visit: Payer: Self-pay | Admitting: Internal Medicine

## 2022-12-09 ENCOUNTER — Other Ambulatory Visit: Payer: Self-pay | Admitting: Gastroenterology

## 2022-12-10 ENCOUNTER — Telehealth: Payer: Self-pay | Admitting: Internal Medicine

## 2022-12-10 DIAGNOSIS — K589 Irritable bowel syndrome without diarrhea: Secondary | ICD-10-CM

## 2022-12-10 NOTE — Telephone Encounter (Signed)
Spoke to pt.   Pt reports she has IBS flares for 2 days on 3/28-29 while she was on vacation. She said that day she was eating 2 eggs over easy, wheat toast, tator tot. Went shopping and had IBS flares. Was trying to used the bathroom but was told can't in the store. Pt had accident on her pant.   Pt states she just can't take vacation and enjoy it. Pt continued that she was ask to eat out at a restaurant but declined bc she is afraid she may have IBS flares again. She just don't know when she will have it.   Pt wants to know if there is new medication that can control her IBS and if she needs to see a specialist.    Pt also requesting a refill on Omeprazole 20mg . She states her insurance covered it with $0 co-pay. Advise pt, Dr. Fabian Sharp did not prescribed that prescription but can relay message to her. Pt is also aware provider is out today and will be back on Tuesday.   Please advise.

## 2022-12-10 NOTE — Telephone Encounter (Signed)
Prescription Request  12/10/2022  LOV: 03/10/2022  What is the name of the medication or equipment? Omeprazole  Have you contacted your pharmacy to request a refill? No   Which pharmacy would you like this sent to?  [id number] [address], [place] [phone] Phone: [phone]  Fax: [phone]    Patient notified that their request is being sent to the clinical staff for review and that they should receive a response within 2 business days.   Please advise at Mobile [phone] (mobile)

## 2022-12-10 NOTE — Telephone Encounter (Signed)
Pt want to know if it a new medication that can control her IBS also want to know if she need to see a specialist and want a call back.

## 2022-12-13 DIAGNOSIS — H1011 Acute atopic conjunctivitis, right eye: Secondary | ICD-10-CM | POA: Diagnosis not present

## 2022-12-13 DIAGNOSIS — H0288A Meibomian gland dysfunction right eye, upper and lower eyelids: Secondary | ICD-10-CM | POA: Diagnosis not present

## 2022-12-13 DIAGNOSIS — H2513 Age-related nuclear cataract, bilateral: Secondary | ICD-10-CM | POA: Diagnosis not present

## 2022-12-13 NOTE — Telephone Encounter (Signed)
Suggest we get a gi consult ( please refer  Gi )  there may be medications that could help bit not cure  and may have side  effects . Lets see what the gi team say  can try probiotic in the interim such as align ( otc)

## 2022-12-14 MED ORDER — OMEPRAZOLE 20 MG PO CPDR: 20.0000 mg | DELAYED_RELEASE_CAPSULE | Freq: Two times a day (BID) | ORAL | 0 refills | Status: DC

## 2022-12-14 NOTE — Telephone Encounter (Signed)
Rx sent in

## 2022-12-14 NOTE — Telephone Encounter (Signed)
GI referral is placed.   Attempted to reach pt. Left a voicemail to call us back.

## 2022-12-15 DIAGNOSIS — M9904 Segmental and somatic dysfunction of sacral region: Secondary | ICD-10-CM | POA: Diagnosis not present

## 2022-12-15 DIAGNOSIS — M9901 Segmental and somatic dysfunction of cervical region: Secondary | ICD-10-CM | POA: Diagnosis not present

## 2022-12-15 DIAGNOSIS — M9905 Segmental and somatic dysfunction of pelvic region: Secondary | ICD-10-CM | POA: Diagnosis not present

## 2022-12-15 DIAGNOSIS — M9903 Segmental and somatic dysfunction of lumbar region: Secondary | ICD-10-CM | POA: Diagnosis not present

## 2022-12-17 NOTE — Telephone Encounter (Signed)
Spoke to patient and inform her of Dr. Fabian Sharp' advise.   Advise pt to contact us if she don't hear from anyone to schedule her appt with GI. Verbalized understanding.

## 2022-12-20 ENCOUNTER — Telehealth: Payer: Self-pay | Admitting: Gastroenterology

## 2022-12-20 DIAGNOSIS — M9904 Segmental and somatic dysfunction of sacral region: Secondary | ICD-10-CM | POA: Diagnosis not present

## 2022-12-20 DIAGNOSIS — M9901 Segmental and somatic dysfunction of cervical region: Secondary | ICD-10-CM | POA: Diagnosis not present

## 2022-12-20 DIAGNOSIS — M9903 Segmental and somatic dysfunction of lumbar region: Secondary | ICD-10-CM | POA: Diagnosis not present

## 2022-12-20 DIAGNOSIS — M9905 Segmental and somatic dysfunction of pelvic region: Secondary | ICD-10-CM | POA: Diagnosis not present

## 2022-12-20 NOTE — Progress Notes (Unsigned)
No chief complaint on file.   HPI: Michele Monroe 77 y.o. come in for on going leg swelling ROS: See pertinent positives and negatives per HPI.  Past Medical History:  Diagnosis Date   Adenomatous colon polyp    ALLERGIC RHINITIS 06/30/2010   perennial   Allergy    seasonal allergies   Anxiety    hx of   Arthritis    bilateral hands (R>L)   Coughing up blood 08/13/2012   piece of tissue? will send to path poss from upper airway sinusitis  Final path was "abscess"    Depression    hx of   GERD 06/30/2010   on meds   Headache(784.0) 06/30/2010   migraines   Hepatic steatosis    HYPERLIPIDEMIA 06/30/2010   on meds   HYPERTENSION 06/30/2010   on meds   IBS 06/30/2010   Internal hemorrhoids    Renal lesion    Varicose veins    VITAMIN D DEFICIENCY 06/30/2010    Family History  Problem Relation Age of Onset   Colon cancer Mother        age 50   Colon polyps Mother    Breast cancer Cousin    Arthritis Brother    Other Brother    Colon polyps Brother    Esophageal cancer Neg Hx    Stomach cancer Neg Hx    Rectal cancer Neg Hx     Social History   Socioeconomic History   Marital status: Widowed    Spouse name: Not on file   Number of children: 2   Years of education: Not on file   Highest education level: Not on file  Occupational History   Occupation: retired    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       Lives in condo      3 cups of caffinated coffee per day. Denies soda/tea   Right handed   Social Determinants of Health   Financial Resource Strain: Low Risk  (10/27/2021)   Overall Financial Resource Strain (CARDIA)    Difficulty of Paying Living Expenses: Not hard at all  Food Insecurity: No Food Insecurity (10/27/2021)   Hunger Vital Sign    Worried About Running Out of Food in the Last Year: Never true    Ran Out of Food in the Last Year: Never true  Transportation Needs: No Transportation Needs (10/27/2021)   PRAPARE - Administrator, Civil Service (Medical): No    Lack of Transportation (Non-Medical): No  Physical Activity: Inactive (10/27/2021)   Exercise Vital Sign    Days of Exercise per Week: 0 days    Minutes of Exercise per Session: 0 min  Stress: Stress Concern Present (10/27/2021)   [employer] of Occupational Health - Occupational Stress Questionnaire    Feeling of Stress : To some extent  Social Connections: Moderately Isolated (10/21/2020)   Social Connection and Isolation Panel [NHANES]    Frequency of Communication with Friends and Family: More than three times a week    Frequency of Social Gatherings with Friends and Family: More than three times a week    Attends Religious Services: 1 to 4 times per year    Active Member of [employer] or Organizations: No    Attends Banker Meetings: Never    Marital Status: Widowed    Outpatient Medications Prior to Visit  Medication Sig Dispense Refill   acetaminophen (TYLENOL) 325 MG tablet Take 650 mg by mouth every 6 (six) hours as needed.     albuterol (VENTOLIN HFA) 108 (90 Base) MCG/ACT inhaler Inhale 2 puffs by mouth every 6 hours as needed. LAST refill pt needs appt. 8.5 each 11   Ascorbic Acid (VITAMIN C) 1000 MG tablet Take 1,000 mg by mouth daily. (Patient not taking: Reported on 10/08/2022)     atenolol (TENORMIN) 25 MG tablet TAKE 1 TABLET BY  MOUTH EVERY DAY 90 tablet 2   atorvastatin (LIPITOR) 20 MG tablet Take 1 tablet (20 mg total) by mouth daily. 90 tablet 1   carbamide peroxide (DEBROX) 6.5 % OTIC solution Place 5 drops into both ears 2 (two) times daily. (Patient not taking: Reported on 05/12/2022) 15 mL 0   cholecalciferol (VITAMIN D3) 25 MCG (1000 UNIT) tablet Take 1,000 Units by mouth daily. (Patient not taking: Reported on 11/23/2022)     fluocinonide-emollient (LIDEX-E) 0.05 % cream Apply 1 Application topically 2 (two) times daily. For itchy bug bites not on face (Patient not taking: Reported on 11/23/2022) 15 g 1   hyoscyamine (ANASPAZ) 0.125 MG TBDP disintergrating tablet take 1 every morning under tongue (Patient not taking: Reported on 11/23/2022) 30 tablet 1   hyoscyamine (LEVSIN SL) 0.125 MG SL tablet DISSOLVE 1 TABLET UNDER THE TONGUE EVERY MORNING AS NEEDED 90 tablet 1   omeprazole (PRILOSEC) 20 MG capsule Take 1 capsule (20 mg total) by mouth 2 (two) times daily before a meal. 180 capsule 0   ondansetron (ZOFRAN-ODT) 4 MG disintegrating tablet TAKE 1 TABLET BY MOUTH EVERY 8 HOURS AS NEEDED FOR NAUSEA AND VOMITING 30 tablet 0   Probiotic Product (PROBIOTIC ACIDOPHILUS BEADS PO) Take by mouth. (Patient not taking: Reported on 11/23/2022)     rizatriptan (MAXALT) 10 MG tablet Take 1 tablet (10 mg total) by mouth as needed for migraine (May repeat after 2 hours.  Maximum 2 tablets in 24 hours.). May repeat in 2 hours if needed (Patient not taking: Reported on 11/23/2022) 11 tablet 5   tiZANidine (ZANAFLEX) 4 MG tablet TAKE ONE-HALF TO ONE TABLET BY MOUTH AT BEDTIME AS NEEDED FOR NECK PAIN. 90 tablet 1   valACYclovir (VALTREX) 1000 MG tablet Take 2 tablets (2,000 mg total) by mouth 2 (two) times daily. (Patient not taking: Reported on 05/12/2022) 30 tablet 1   vitamin B-12 (CYANOCOBALAMIN) 500 MCG tablet Take 500 mcg by mouth daily. (Patient not taking: Reported on 11/23/2022)     Zinc 30 MG CAPS Take by mouth. (Patient not taking:  Reported on 11/23/2022)     No facility-administered medications prior to visit.     EXAM:  There were no vitals taken for this visit.  There is no height or weight on file to  calculate BMI.  GENERAL: vitals reviewed and listed above, alert, oriented, appears well hydrated and in no acute distress HEENT: atraumatic, conjunctiva  clear, no obvious abnormalities on inspection of external nose and ears OP : no lesion edema or exudate  NECK: no obvious masses on inspection palpation  LUNGS: clear to auscultation bilaterally, no wheezes, rales or rhonchi, good air movement CV: HRRR, no clubbing cyanosis or  peripheral edema nl cap refill  MS: moves all extremities without noticeable focal  abnormality PSYCH: pleasant and cooperative, no obvious depression or anxiety Lab Results  Component Value Date   WBC 10.3 10/16/2021   HGB 12.4 10/16/2021   HCT 38.3 10/16/2021   PLT 175 10/16/2021   GLUCOSE 194 (H) 10/16/2021   CHOL 162 04/08/2021   TRIG 185.0 (H) 04/08/2021   HDL 49.90 04/08/2021   LDLCALC 75 04/08/2021   ALT 21 10/15/2021   AST 20 10/15/2021   NA 137 10/16/2021   K 3.9 10/16/2021   CL 103 10/16/2021   CREATININE 0.74 10/16/2021   BUN 7 (L) 10/16/2021   CO2 26 10/16/2021   TSH 1.85 04/08/2021   BP Readings from Last 3 Encounters:  10/08/22 132/83  03/10/22 126/80  01/19/22 124/70    ASSESSMENT AND PLAN:  Discussed the following assessment and plan:  No diagnosis found.  -Patient advised to return or notify health care team  if  new concerns arise.  There are no Patient Instructions on file for this visit.   Neta Mends. Arlinda Barcelona M.D.

## 2022-12-20 NOTE — Telephone Encounter (Signed)
Patient called stating her IBS has been bothering her really bad. Requesting a call back to see what she could do. Please advise.

## 2022-12-20 NOTE — Telephone Encounter (Signed)
After eating she will have urgent loose stools. She is taking hyoscyamine every morning. She does not repeat the dose because it is ordered for daily. She reports spells of incontinence. Patient asks about stool tests and if they can be done.

## 2022-12-21 ENCOUNTER — Other Ambulatory Visit: Payer: Self-pay

## 2022-12-21 ENCOUNTER — Encounter: Payer: Self-pay | Admitting: Internal Medicine

## 2022-12-21 ENCOUNTER — Ambulatory Visit (INDEPENDENT_AMBULATORY_CARE_PROVIDER_SITE_OTHER): Payer: Medicare Other | Admitting: Internal Medicine

## 2022-12-21 VITALS — BP 112/66 | HR 78 | Temp 97.9°F | Ht 65.0 in | Wt 172.4 lb

## 2022-12-21 DIAGNOSIS — I10 Essential (primary) hypertension: Secondary | ICD-10-CM

## 2022-12-21 DIAGNOSIS — Z79899 Other long term (current) drug therapy: Secondary | ICD-10-CM

## 2022-12-21 DIAGNOSIS — M6289 Other specified disorders of muscle: Secondary | ICD-10-CM

## 2022-12-21 DIAGNOSIS — K589 Irritable bowel syndrome without diarrhea: Secondary | ICD-10-CM

## 2022-12-21 DIAGNOSIS — R152 Fecal urgency: Secondary | ICD-10-CM

## 2022-12-21 DIAGNOSIS — R600 Localized edema: Secondary | ICD-10-CM

## 2022-12-21 DIAGNOSIS — Z86718 Personal history of other venous thrombosis and embolism: Secondary | ICD-10-CM | POA: Diagnosis not present

## 2022-12-21 DIAGNOSIS — R519 Headache, unspecified: Secondary | ICD-10-CM | POA: Diagnosis not present

## 2022-12-21 DIAGNOSIS — R197 Diarrhea, unspecified: Secondary | ICD-10-CM

## 2022-12-21 DIAGNOSIS — W57XXXA Bitten or stung by nonvenomous insect and other nonvenomous arthropods, initial encounter: Secondary | ICD-10-CM | POA: Diagnosis not present

## 2022-12-21 NOTE — Patient Instructions (Signed)
Lab today  We may add diuretic  pill for the fluid build up in legs  and then go from there.  Ok to use cortisone on the itchy bug bites.   And plan follow up. Marland Kitchen

## 2022-12-21 NOTE — Telephone Encounter (Signed)
Okay, please check C. difficile and GI pathogen panel if covered by insurance if not stool culture.  Advise patient to start taking Benefiber 1 tablespoon 3 times daily with meals and refer to pelvic floor physical therapy to improve anal sphincter tone.  Thank you

## 2022-12-21 NOTE — Telephone Encounter (Signed)
Patient agrees to this plan of care.  

## 2022-12-22 LAB — CBC WITH DIFFERENTIAL/PLATELET
Basophils Absolute: 0.1 10*3/uL (ref 0.0–0.1)
Basophils Relative: 0.9 % (ref 0.0–3.0)
Eosinophils Absolute: 0.6 10*3/uL (ref 0.0–0.7)
Eosinophils Relative: 6.6 % — ABNORMAL HIGH (ref 0.0–5.0)
HCT: 39.1 % (ref 36.0–46.0)
Hemoglobin: 13.2 g/dL (ref 12.0–15.0)
Lymphocytes Relative: 33.7 % (ref 12.0–46.0)
Lymphs Abs: 3.2 10*3/uL (ref 0.7–4.0)
MCHC: 33.7 g/dL (ref 30.0–36.0)
MCV: 84.9 fl (ref 78.0–100.0)
Monocytes Absolute: 0.8 10*3/uL (ref 0.1–1.0)
Monocytes Relative: 8.5 % (ref 3.0–12.0)
Neutro Abs: 4.8 10*3/uL (ref 1.4–7.7)
Neutrophils Relative %: 50.3 % (ref 43.0–77.0)
Platelets: 250 10*3/uL (ref 150.0–400.0)
RBC: 4.61 Mil/uL (ref 3.87–5.11)
RDW: 12.9 % (ref 11.5–15.5)
WBC: 9.5 10*3/uL (ref 4.0–10.5)

## 2022-12-22 LAB — BASIC METABOLIC PANEL
BUN: 17 mg/dL (ref 6–23)
CO2: 28 mEq/L (ref 19–32)
Calcium: 9.9 mg/dL (ref 8.4–10.5)
Chloride: 103 mEq/L (ref 96–112)
Creatinine, Ser: 0.96 mg/dL (ref 0.40–1.20)
GFR: 57.86 mL/min — ABNORMAL LOW (ref >60.00)
Glucose, Bld: 94 mg/dL (ref 70–99)
Potassium: 4.1 mEq/L (ref 3.5–5.1)
Sodium: 139 mEq/L (ref 135–145)

## 2022-12-22 LAB — T4, FREE: Free T4: 0.83 ng/dL (ref 0.60–1.60)

## 2022-12-22 LAB — HEMOGLOBIN A1C: Hgb A1c MFr Bld: 5.6 % (ref 4.6–6.5)

## 2022-12-22 LAB — MICROALBUMIN / CREATININE URINE RATIO
Creatinine,U: 57.2 mg/dL
Microalb Creat Ratio: 1.2 mg/g (ref 0.0–30.0)
Microalb, Ur: <0.7 mg/dL (ref 0.0–1.9)

## 2022-12-22 LAB — TSH: TSH: 1.31 u[IU]/mL (ref 0.35–5.50)

## 2022-12-22 LAB — HEPATIC FUNCTION PANEL
ALT: 13 U/L (ref 0–35)
AST: 21 U/L (ref 0–37)
Albumin: 4.3 g/dL (ref 3.5–5.2)
Alkaline Phosphatase: 63 U/L (ref 39–117)
Bilirubin, Direct: 0.1 mg/dL (ref 0.0–0.3)
Total Bilirubin: 0.4 mg/dL (ref 0.2–1.2)
Total Protein: 7.2 g/dL (ref 6.0–8.3)

## 2022-12-23 ENCOUNTER — Other Ambulatory Visit: Payer: Medicare Other

## 2022-12-23 DIAGNOSIS — R197 Diarrhea, unspecified: Secondary | ICD-10-CM

## 2022-12-26 LAB — CLOSTRIDIUM DIFFICILE BY PCR: Toxigenic C. Difficile by PCR: NEGATIVE

## 2022-12-27 ENCOUNTER — Telehealth: Payer: Self-pay | Admitting: Internal Medicine

## 2022-12-27 DIAGNOSIS — M9904 Segmental and somatic dysfunction of sacral region: Secondary | ICD-10-CM | POA: Diagnosis not present

## 2022-12-27 DIAGNOSIS — M9905 Segmental and somatic dysfunction of pelvic region: Secondary | ICD-10-CM | POA: Diagnosis not present

## 2022-12-27 DIAGNOSIS — M9903 Segmental and somatic dysfunction of lumbar region: Secondary | ICD-10-CM | POA: Diagnosis not present

## 2022-12-27 DIAGNOSIS — M9901 Segmental and somatic dysfunction of cervical region: Secondary | ICD-10-CM | POA: Diagnosis not present

## 2022-12-27 NOTE — Telephone Encounter (Signed)
Pt called, requesting a call back from CMA to go over labs. CMA was with a patient. Pt asked that CMA call back at her earliest convenience.

## 2022-12-28 NOTE — Progress Notes (Signed)
Lab results stable and no additional cause of the swelling.  IF not going away  with decrease sodium in diet    Ok to try a short course of diuretic  with caution  and then fu if ongoing .   Can send in furosemide 20 mg per day for  7 days or as directed disp 21  no refill  If you take the diuretic plan fu visit  to reassess.  Make sure wearing compression  as possible to reduce the risk of swelling

## 2022-12-29 ENCOUNTER — Other Ambulatory Visit: Payer: Self-pay

## 2022-12-29 DIAGNOSIS — R609 Edema, unspecified: Secondary | ICD-10-CM

## 2022-12-29 MED ORDER — FUROSEMIDE 20 MG PO TABS: 20.0000 mg | ORAL_TABLET | Freq: Every day | ORAL | 0 refills | Status: DC

## 2023-01-03 DIAGNOSIS — M9904 Segmental and somatic dysfunction of sacral region: Secondary | ICD-10-CM | POA: Diagnosis not present

## 2023-01-03 DIAGNOSIS — M9905 Segmental and somatic dysfunction of pelvic region: Secondary | ICD-10-CM | POA: Diagnosis not present

## 2023-01-03 DIAGNOSIS — M9903 Segmental and somatic dysfunction of lumbar region: Secondary | ICD-10-CM | POA: Diagnosis not present

## 2023-01-03 DIAGNOSIS — M9901 Segmental and somatic dysfunction of cervical region: Secondary | ICD-10-CM | POA: Diagnosis not present

## 2023-01-10 ENCOUNTER — Telehealth: Payer: Self-pay | Admitting: Internal Medicine

## 2023-01-10 DIAGNOSIS — M9901 Segmental and somatic dysfunction of cervical region: Secondary | ICD-10-CM | POA: Diagnosis not present

## 2023-01-10 DIAGNOSIS — M9905 Segmental and somatic dysfunction of pelvic region: Secondary | ICD-10-CM | POA: Diagnosis not present

## 2023-01-10 DIAGNOSIS — M9903 Segmental and somatic dysfunction of lumbar region: Secondary | ICD-10-CM | POA: Diagnosis not present

## 2023-01-10 DIAGNOSIS — M9904 Segmental and somatic dysfunction of sacral region: Secondary | ICD-10-CM | POA: Diagnosis not present

## 2023-01-10 NOTE — Telephone Encounter (Signed)
Contacted Michele Monroe to schedule their annual wellness visit. Appointment made for 01/14/23.  [name] AWV direct phone # [phone]

## 2023-01-14 ENCOUNTER — Ambulatory Visit (INDEPENDENT_AMBULATORY_CARE_PROVIDER_SITE_OTHER): Payer: Medicare Other

## 2023-01-14 VITALS — Ht 65.0 in | Wt 172.0 lb

## 2023-01-14 DIAGNOSIS — Z0000 Encounter for general adult medical examination without abnormal findings: Secondary | ICD-10-CM | POA: Diagnosis not present

## 2023-01-14 NOTE — Progress Notes (Addendum)
Subjective:   Michele Monroe is a 77 y.o. female who presents for Medicare Annual (Subsequent) preventive examination.  Review of Systems    Virtual Visit via Telephone Note  I connected with  Michele Monroe on 01/17/23 at  3:30 PM EDT by telephone and verified that I am speaking with the correct person using two identifiers.  Location: Patient: Home Provider: Office Persons participating in the virtual visit: patient/Nurse Health Advisor   I discussed the limitations, risks, security and privacy concerns of performing an evaluation and management service by telephone and the availability of in person appointments. The patient expressed understanding and agreed to proceed.  Interactive audio and video telecommunications were attempted between this nurse and patient, however failed, due to patient having technical difficulties OR patient did not have access to video capability.  We continued and completed visit with audio only.  Some vital signs may be absent or patient reported.   Tillie Rung, LPN  Cardiac Risk Factors include: advanced age (>79men, >11 women);hypertension     Objective:    Today's Vitals   01/14/23 1525  Weight: 172 lb (78 kg)  Height: 5\' 5"  (1.651 m)   Body mass index is 28.62 kg/m.     01/14/2023    3:33 PM 10/27/2021    9:16 AM 10/16/2021   11:04 AM 08/06/2021    9:29 AM 10/21/2020    8:58 AM 07/21/2020    8:52 AM 02/28/2020    9:04 AM  Advanced Directives  Does Patient Have a Medical Advance Directive? Yes No No No No No No  Type of Estate agent of [place];Living will        Copy of Healthcare Power of Attorney in Chart? No - copy requested        Would patient like information on creating a medical advance directive?   No - Patient declined  Yes (MAU/Ambulatory/Procedural Areas - Information given)      Current Medications (verified) Outpatient Encounter Medications as of 01/14/2023  Medication Sig   acetaminophen  (TYLENOL) 325 MG tablet Take 650 mg by mouth every 6 (six) hours as needed. (Patient not taking: Reported on 12/21/2022)   albuterol (VENTOLIN HFA) 108 (90 Base) MCG/ACT inhaler Inhale 2 puffs by mouth every 6 hours as needed. LAST refill pt needs appt.   Ascorbic Acid (VITAMIN C) 1000 MG tablet Take 1,000 mg by mouth daily. (Patient not taking: Reported on 10/08/2022)   atenolol (TENORMIN) 25 MG tablet TAKE 1 TABLET BY MOUTH EVERY DAY   atorvastatin (LIPITOR) 20 MG tablet Take 1 tablet (20 mg total) by mouth daily.   carbamide peroxide (DEBROX) 6.5 % OTIC solution Place 5 drops into both ears 2 (two) times daily. (Patient not taking: Reported on 05/12/2022)   cholecalciferol (VITAMIN D3) 25 MCG (1000 UNIT) tablet Take 1,000 Units by mouth daily. (Patient not taking: Reported on 11/23/2022)   fluocinonide-emollient (LIDEX-E) 0.05 % cream Apply 1 Application topically 2 (two) times daily. For itchy bug bites not on face (Patient not taking: Reported on 11/23/2022)   hyoscyamine (ANASPAZ) 0.125 MG TBDP disintergrating tablet take 1 every morning under tongue (Patient not taking: Reported on 11/23/2022)   hyoscyamine (LEVSIN SL) 0.125 MG SL tablet DISSOLVE 1 TABLET UNDER THE TONGUE EVERY MORNING AS NEEDED   omeprazole (PRILOSEC) 20 MG capsule Take 1 capsule (20 mg total) by mouth 2 (two) times daily before a meal.   ondansetron (ZOFRAN-ODT) 4 MG disintegrating tablet TAKE 1 TABLET BY  MOUTH EVERY 8 HOURS AS NEEDED FOR NAUSEA AND VOMITING   Probiotic Product (PROBIOTIC ACIDOPHILUS BEADS PO) Take by mouth.   rizatriptan (MAXALT) 10 MG tablet Take 1 tablet (10 mg total) by mouth as needed for migraine (May repeat after 2 hours.  Maximum 2 tablets in 24 hours.). May repeat in 2 hours if needed (Patient not taking: Reported on 11/23/2022)   tiZANidine (ZANAFLEX) 4 MG tablet TAKE ONE-HALF TO ONE TABLET BY MOUTH AT BEDTIME AS NEEDED FOR NECK PAIN.   valACYclovir (VALTREX) 1000 MG tablet Take 2 tablets (2,000 mg total) by  mouth 2 (two) times daily.   vitamin B-12 (CYANOCOBALAMIN) 500 MCG tablet Take 500 mcg by mouth daily. (Patient not taking: Reported on 12/21/2022)   Wheat Dextrin (BENEFIBER) POWD Take by mouth.   Zinc 30 MG CAPS Take by mouth. (Patient not taking: Reported on 11/23/2022)   [DISCONTINUED] furosemide (LASIX) 20 MG tablet Take 1 tablet (20 mg total) by mouth daily.   No facility-administered encounter medications on file as of 01/14/2023.    Allergies (verified) Patient has no known allergies.   History: Past Medical History:  Diagnosis Date   Adenomatous colon polyp    ALLERGIC RHINITIS 06/30/2010   perennial   Allergy    seasonal allergies   Anxiety    hx of   Arthritis    bilateral hands (R>L)   Coughing up blood 08/13/2012   piece of tissue? will send to path poss from upper airway sinusitis  Final path was "abscess"    Depression    hx of   GERD 06/30/2010   on meds   Headache(784.0) 06/30/2010   migraines   Hepatic steatosis    HYPERLIPIDEMIA 06/30/2010   on meds   HYPERTENSION 06/30/2010   on meds   IBS 06/30/2010   Internal hemorrhoids    Renal lesion    Varicose veins    VITAMIN D DEFICIENCY 06/30/2010   Past Surgical History:  Procedure Laterality Date   BREAST CYST ASPIRATION     BREAST SURGERY     needle bx left breast   COLONOSCOPY  2016   KN-prep good-TA   feet     hammer toe sx   POLYPECTOMY  2016   TA   ROTATOR CUFF REPAIR Right    varicose veins     Family History  Problem Relation Age of Onset   Colon cancer Mother        age 74   Colon polyps Mother    Breast cancer Cousin    Arthritis Brother    Other Brother    Colon polyps Brother    Esophageal cancer Neg Hx    Stomach cancer Neg Hx    Rectal cancer Neg Hx    Social History   Socioeconomic History   Marital status: Widowed    Spouse name: Not on file   Number of children: 2   Years of education: Not on file   Highest education level: Not on file  Occupational History    Occupation: retired    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       Lives in condo      3 cups of caffinated coffee per day. Denies soda/tea   Right handed   Social Determinants of Health   Financial Resource Strain: Low Risk  (01/14/2023)   Overall Financial Resource Strain (CARDIA)    Difficulty of Paying Living Expenses: Not hard at all  Food Insecurity: No Food Insecurity (01/14/2023)   Hunger Vital Sign    Worried About Running Out of Food in the Last Year: Never true    Ran Out of Food in the Last Year: Never true  Transportation Needs: No Transportation Needs (01/14/2023)   PRAPARE - Administrator, Civil Service (Medical): No    Lack of Transportation (Non-Medical): No  Physical Activity: Insufficiently Active (01/14/2023)   Exercise Vital Sign    Days of Exercise per Week: 7 days    Minutes of Exercise per Session: 20 min  Stress: No Stress Concern Present (01/14/2023)   [employer] of Occupational Health - Occupational Stress Questionnaire    Feeling of Stress : Not at all  Social Connections: Socially Isolated (01/14/2023)   Social Connection and Isolation Panel [NHANES]    Frequency of Communication with Friends and Family: More than three times a week    Frequency of Social Gatherings with Friends and Family: More than three times a week    Attends Religious  Services: Never    Database administrator or Organizations: No    Attends Banker Meetings: Never    Marital Status: Widowed    Tobacco Counseling Counseling given: Not Answered   Clinical Intake:  Pre-visit preparation completed: No  Pain : No/denies pain     BMI - recorded: 28.62 Nutritional Status: BMI 25 -29 Overweight Nutritional Risks: None Diabetes: No  How often do you need to have someone help you when you read instructions, pamphlets, or other written materials from your doctor or pharmacy?: 1 - Never  Diabetic?  No  Interpreter Needed?: No  Information entered by :: Theresa Mulligan LPN   Activities of Daily Living    01/14/2023    3:32 PM  In your present state of health, do you have any difficulty performing the following activities:  Hearing? 0  Vision? 0  Difficulty concentrating or making decisions? 0  Walking or climbing stairs? 0  Dressing or bathing? 0  Doing errands, shopping? 0  Preparing Food and eating ? N  Using the Toilet? N  In the past six months, have you accidently leaked urine? N  Do you have problems with loss of bowel control? N  Managing your Medications? N  Managing your Finances? N  Housekeeping or managing your Housekeeping? N    Patient Care Team: Panosh, Neta Mends, MD as PCP - Sandria Senter, Rachelle Hora, DO as Consulting Physician (Neurology) Napoleon Form, MD as Consulting Physician (Gastroenterology)  Indicate any recent Medical Services you may have received from other than [facility] providers in the past year (date may be approximate).     Assessment:   This is a routine wellness examination for  Michele Monroe.  Hearing/Vision screen Hearing Screening - Comments:: Denies hearing difficulties   Vision Screening - Comments:: Wears rx glasses - up to date with routine eye exams with  [facility]  Dietary issues and exercise activities discussed: Exercise limited by: None identified   Goals Addressed                This Visit's Progress     Stay Healthy (pt-stated)         Depression Screen    01/14/2023    3:31 PM 12/21/2022    4:36 PM 11/23/2022   10:57 AM 10/27/2021    9:17 AM 10/21/2020    8:57 AM 10/16/2020    9:15 AM 05/02/2019   10:30 AM  PHQ 2/9 Scores  PHQ - 2 Score 0 0 0 0 0 0 0  PHQ- 9 Score 0 8         Fall Risk    01/14/2023    3:32 PM 12/21/2022    4:36 PM 11/23/2022   10:59 AM 03/24/2022    8:45 AM 10/27/2021    9:17 AM  Fall Risk   Falls in the past year? 0 0 0 0 0  Number falls in past yr: 0 0 0 0   Injury with Fall? 0 0 0 0   Risk for fall due to : No Fall Risks No Fall Risks No Fall Risks  Medication side effect  Follow up Falls prevention discussed Falls evaluation completed Falls evaluation completed  Falls evaluation completed;Education provided;Falls prevention discussed    FALL RISK PREVENTION PERTAINING TO THE HOME:  Any stairs in or around the home? No  If so, are there any without handrails? No  Home free of loose throw rugs in walkways, pet beds, electrical cords, etc? Yes  Adequate lighting in your home to reduce risk of falls? Yes   ASSISTIVE DEVICES UTILIZED TO PREVENT FALLS:  Life alert? No  Use of a cane, walker or w/c? No  Grab bars in the bathroom? No  Shower chair or bench in shower? No  Elevated toilet seat or a handicapped toilet? No   TIMED UP AND GO:  Was the test performed? No . Audio Visit   Cognitive Function:        01/14/2023    3:33 PM 10/27/2021    9:19 AM 10/21/2020    9:06 AM  6CIT Screen  What Year? 0 points 0 points   What month? 0 points 0 points   What time? 0 points 0 points   Count back from 20 0 points 0 points 0 points  Months in reverse 0 points 0 points 0 points  Repeat phrase 0 points 4 points 4 points  Total Score 0 points 4 points     Immunizations Immunization History  Administered Date(s) Administered   Fluad Quad(high Dose 65+) 05/02/2019   Influenza Split 06/05/2012, 05/15/2013    Influenza, High Dose Seasonal PF 04/20/2016, 06/23/2017, 06/19/2018   Influenza,inj,Quad PF,6+ Mos 06/03/2015   Influenza-Unspecified 06/06/2014   Pneumococcal Conjugate-13 08/07/2012   Pneumococcal Polysaccharide-23 09/24/2012   Td 09/07/2007   Zoster, Live 09/24/2012    TDAP status: Due, Education has been provided regarding the importance of this vaccine. Advised may receive this vaccine at local pharmacy or Health Dept. Aware to provide a copy of the vaccination record if obtained from local pharmacy or Health Dept. Verbalized acceptance and understanding.  Flu Vaccine status: Up to date  Pneumococcal vaccine status: Up to date  Covid-19 vaccine status: Completed vaccines  Qualifies for Shingles Vaccine? Yes   Zostavax completed No   Shingrix Completed?: No.    Education has been provided regarding the importance of this vaccine. Patient has been advised to call insurance company to determine out of pocket expense if they have not yet received this vaccine. Advised may also receive vaccine at local pharmacy or Health Dept. Verbalized acceptance and understanding.  Screening Tests Health Maintenance  Topic Date Due   DTaP/Tdap/Td (2 - Tdap) 09/06/2017   Zoster Vaccines- Shingrix (1 of 2) 04/16/2023 (Originally 06/07/1965)   INFLUENZA VACCINE  04/07/2023   Medicare Annual Wellness (AWV)  01/14/2024   COLONOSCOPY (Pts 45-63yrs Insurance coverage will need to be confirmed)  04/06/2024   Pneumonia Vaccine 48+ Years old  Completed   DEXA SCAN  Completed   Hepatitis C Screening  Completed   HPV VACCINES  Aged Out   COVID-19 Vaccine  Discontinued    Health Maintenance  Health Maintenance Due  Topic Date Due   DTaP/Tdap/Td (2 - Tdap) 09/06/2017    Colorectal cancer screening: Type of screening: Colonoscopy. Completed 04/06/21. Repeat every 3 years  Mammogram status: No longer required due to Age.  Bone Density status: Completed 06/04/19. Results reflect: Bone density results:  OSTEOPOROSIS. Repeat every   years.  Lung Cancer Screening: (Low Dose CT Chest recommended if Age 29-80 years, 30 pack-year currently smoking OR have quit w/in 15years.) does not qualify.     Additional Screening:  Hepatitis C Screening: does qualify; Completed 08/08/17  Vision Screening: Recommended annual ophthalmology exams for early detection of glaucoma and other disorders of the eye. Is the patient up to date with their annual eye exam?  Yes  Who is the provider or what is the name of the office in which the patient attends annual eye exams? [facility] If pt is not established with a provider, would they like to be referred to a provider to establish care? No .   Dental Screening: Recommended annual dental exams for proper oral hygiene  Community Resource Referral / Chronic Care Management:  CRR required this visit?  No   CCM required this visit?  No      Plan:     I have personally reviewed and noted the following in the patient's chart:   Medical and social history Use of alcohol, tobacco or illicit drugs  Current medications and supplements including opioid prescriptions. Patient is not currently taking opioid prescriptions. Functional ability and status Nutritional status Physical activity Advanced directives List of other physicians Hospitalizations, surgeries, and ER visits in previous 12 months Vitals Screenings to include cognitive, depression, and falls Referrals and appointments  In addition, I have reviewed and discussed with patient certain preventive protocols, quality metrics, and best practice recommendations. A written personalized care plan for preventive services as well as general preventive health recommendations were provided to patient.     Tillie Rung, LPN   [id number]   Nurse Notes: None

## 2023-01-14 NOTE — Patient Instructions (Addendum)
Ms. Michele Monroe , Thank you for taking time to come for your Medicare Wellness Visit. I appreciate your ongoing commitment to your health goals. Please review the following plan we discussed and let me know if I can assist you in the future.   These are the goals we discussed:  Goals       Patient Stated      Lose weight       Patient Stated      10/27/2021, get blood clot gone      Stay Healthy (pt-stated)        This is a list of the screening recommended for you and due dates:  Health Maintenance  Topic Date Due   DTaP/Tdap/Td vaccine (2 - Tdap) 09/06/2017   Zoster (Shingles) Vaccine (1 of 2) 04/16/2023*   Flu Shot  04/07/2023   Medicare Annual Wellness Visit  01/14/2024   Colon Cancer Screening  04/06/2024   Pneumonia Vaccine  Completed   DEXA scan (bone density measurement)  Completed   Hepatitis C Screening: USPSTF Recommendation to screen - Ages 25-79 yo.  Completed   HPV Vaccine  Aged Out   COVID-19 Vaccine  Discontinued  *Topic was postponed. The date shown is not the original due date.    Advanced directives: Please bring a copy of your health care power of attorney and living will to the office to be added to your chart at your convenience.   Conditions/risks identified: None  Next appointment: Follow up in one year for your annual wellness visit    Preventive Care 65 Years and Older, Female Preventive care refers to lifestyle choices and visits with your health care provider that can promote health and wellness. What does preventive care include? A yearly physical exam. This is also called an annual well check. Dental exams once or twice a year. Routine eye exams. Ask your health care provider how often you should have your eyes checked. Personal lifestyle choices, including: Daily care of your teeth and gums. Regular physical activity. Eating a healthy diet. Avoiding tobacco and drug use. Limiting alcohol use. Practicing safe sex. Taking low-dose aspirin every  day. Taking vitamin and mineral supplements as recommended by your health care provider. What happens during an annual well check? The services and screenings done by your health care provider during your annual well check will depend on your age, overall health, lifestyle risk factors, and family history of disease. Counseling  Your health care provider may ask you questions about your: Alcohol use. Tobacco use. Drug use. Emotional well-being. Home and relationship well-being. Sexual activity. Eating habits. History of falls. Memory and ability to understand (cognition). Work and work Astronomer. Reproductive health. Screening  You may have the following tests or measurements: Height, weight, and BMI. Blood pressure. Lipid and cholesterol levels. These may be checked every 5 years, or more frequently if you are over 75 years old. Skin check. Lung cancer screening. You may have this screening every year starting at age 57 if you have a 30-pack-year history of smoking and currently smoke or have quit within the past 15 years. Fecal occult blood test (FOBT) of the stool. You may have this test every year starting at age 43. Flexible sigmoidoscopy or colonoscopy. You may have a sigmoidoscopy every 5 years or a colonoscopy every 10 years starting at age 19. Hepatitis C blood test. Hepatitis B blood test. Sexually transmitted disease (STD) testing. Diabetes screening. This is done by checking your blood sugar (glucose) after you have  not eaten for a while (fasting). You may have this done every 1-3 years. Bone density scan. This is done to screen for osteoporosis. You may have this done starting at age 75. Mammogram. This may be done every 1-2 years. Talk to your health care provider about how often you should have regular mammograms. Talk with your health care provider about your test results, treatment options, and if necessary, the need for more tests. Vaccines  Your health care  provider may recommend certain vaccines, such as: Influenza vaccine. This is recommended every year. Tetanus, diphtheria, and acellular pertussis (Tdap, Td) vaccine. You may need a Td booster every 10 years. Zoster vaccine. You may need this after age 63. Pneumococcal 13-valent conjugate (PCV13) vaccine. One dose is recommended after age 54. Pneumococcal polysaccharide (PPSV23) vaccine. One dose is recommended after age 45. Talk to your health care provider about which screenings and vaccines you need and how often you need them. This information is not intended to replace advice given to you by your health care provider. Make sure you discuss any questions you have with your health care provider. Document Released: 09/19/2015 Document Revised: 05/12/2016 Document Reviewed: 06/24/2015 Elsevier Interactive Patient Education  2017 [place] Prevention in the Home Falls can cause injuries. They can happen to people of all ages. There are many things you can do to make your home safe and to help prevent falls. What can I do on the outside of my home? Regularly fix the edges of walkways and driveways and fix any cracks. Remove anything that might make you trip as you walk through a door, such as a raised step or threshold. Trim any bushes or trees on the path to your home. Use bright outdoor lighting. Clear any walking paths of anything that might make someone trip, such as rocks or tools. Regularly check to see if handrails are loose or broken. Make sure that both sides of any steps have handrails. Any raised decks and porches should have guardrails on the edges. Have any leaves, [place], or ice cleared regularly. Use sand or salt on walking paths during winter. Clean up any spills in your garage right away. This includes oil or grease spills. What can I do in the bathroom? Use night lights. Install grab bars by the toilet and in the tub and shower. Do not use towel bars as grab  bars. Use non-skid mats or decals in the tub or shower. If you need to sit down in the shower, use a plastic, non-slip stool. Keep the floor dry. Clean up any water that spills on the floor as soon as it happens. Remove soap buildup in the tub or shower regularly. Attach bath mats securely with double-sided non-slip rug tape. Do not have throw rugs and other things on the floor that can make you trip. What can I do in the bedroom? Use night lights. Make sure that you have a light by your bed that is easy to reach. Do not use any sheets or blankets that are too big for your bed. They should not hang down onto the floor. Have a firm chair that has side arms. You can use this for support while you get dressed. Do not have throw rugs and other things on the floor that can make you trip. What can I do in the kitchen? Clean up any spills right away. Avoid walking on wet floors. Keep items that you use a lot in easy-to-reach places. If you need to  reach something above you, use a strong step stool that has a grab bar. Keep electrical cords out of the way. Do not use floor polish or wax that makes floors slippery. If you must use wax, use non-skid floor wax. Do not have throw rugs and other things on the floor that can make you trip. What can I do with my stairs? Do not leave any items on the stairs. Make sure that there are handrails on both sides of the stairs and use them. Fix handrails that are broken or loose. Make sure that handrails are as long as the stairways. Check any carpeting to make sure that it is firmly attached to the stairs. Fix any carpet that is loose or worn. Avoid having throw rugs at the top or bottom of the stairs. If you do have throw rugs, attach them to the floor with carpet tape. Make sure that you have a light switch at the top of the stairs and the bottom of the stairs. If you do not have them, ask someone to add them for you. What else can I do to help prevent  falls? Wear shoes that: Do not have high heels. Have rubber bottoms. Are comfortable and fit you well. Are closed at the toe. Do not wear sandals. If you use a stepladder: Make sure that it is fully opened. Do not climb a closed stepladder. Make sure that both sides of the stepladder are locked into place. Ask someone to hold it for you, if possible. Clearly mark and make sure that you can see: Any grab bars or handrails. First and last steps. Where the edge of each step is. Use tools that help you move around (mobility aids) if they are needed. These include: Canes. Walkers. Scooters. Crutches. Turn on the lights when you go into a dark area. Replace any light bulbs as soon as they burn out. Set up your furniture so you have a clear path. Avoid moving your furniture around. If any of your floors are uneven, fix them. If there are any pets around you, be aware of where they are. Review your medicines with your doctor. Some medicines can make you feel dizzy. This can increase your chance of falling. Ask your doctor what other things that you can do to help prevent falls. This information is not intended to replace advice given to you by your health care provider. Make sure you discuss any questions you have with your health care provider. Document Released: 06/19/2009 Document Revised: 01/29/2016 Document Reviewed: 09/27/2014 Elsevier Interactive Patient Education  2017 [employer].

## 2023-01-16 ENCOUNTER — Other Ambulatory Visit: Payer: Self-pay | Admitting: Internal Medicine

## 2023-01-16 DIAGNOSIS — R609 Edema, unspecified: Secondary | ICD-10-CM

## 2023-01-17 DIAGNOSIS — R059 Cough, unspecified: Secondary | ICD-10-CM | POA: Diagnosis not present

## 2023-01-17 DIAGNOSIS — Z20822 Contact with and (suspected) exposure to covid-19: Secondary | ICD-10-CM | POA: Diagnosis not present

## 2023-01-17 DIAGNOSIS — J209 Acute bronchitis, unspecified: Secondary | ICD-10-CM | POA: Diagnosis not present

## 2023-01-21 ENCOUNTER — Encounter: Payer: Self-pay | Admitting: Family Medicine

## 2023-01-21 ENCOUNTER — Telehealth (INDEPENDENT_AMBULATORY_CARE_PROVIDER_SITE_OTHER): Payer: Medicare Other | Admitting: Family Medicine

## 2023-01-21 DIAGNOSIS — J45901 Unspecified asthma with (acute) exacerbation: Secondary | ICD-10-CM

## 2023-01-21 DIAGNOSIS — S30861A Insect bite (nonvenomous) of abdominal wall, initial encounter: Secondary | ICD-10-CM

## 2023-01-21 DIAGNOSIS — R0989 Other specified symptoms and signs involving the circulatory and respiratory systems: Secondary | ICD-10-CM | POA: Diagnosis not present

## 2023-01-21 DIAGNOSIS — W57XXXA Bitten or stung by nonvenomous insect and other nonvenomous arthropods, initial encounter: Secondary | ICD-10-CM | POA: Diagnosis not present

## 2023-01-21 MED ORDER — DOXYCYCLINE HYCLATE 100 MG PO TABS
100.0000 mg | ORAL_TABLET | Freq: Two times a day (BID) | ORAL | 0 refills | Status: DC
Start: 2023-01-21 — End: 2023-01-29

## 2023-01-21 MED ORDER — PREDNISONE 10 MG PO TABS
ORAL_TABLET | ORAL | 0 refills | Status: DC
Start: 2023-01-21 — End: 2023-01-29

## 2023-01-21 NOTE — Progress Notes (Signed)
Virtual Visit via Video Note  I connected with Michele Monroe on 01/21/23 at  1:30 PM EDT by a video enabled telemedicine application and verified that I am speaking with the correct person using two identifiers.  Location patient: home Location provider:work or home office Persons participating in the virtual visit: patient, provider  I discussed the limitations of evaluation and management by telemedicine and the availability of in person appointments. The patient expressed understanding and agreed to proceed.  Chief Complaint  Patient presents with   Cough    Last mon went to UC, was given z-pak and benzonatate 200 mg, got a shot in but for strep. Was tested for flu, strep, and covid all were negative. Finished the z-pak and cough is now worse. Did have some diarrhea before the visit. Did also take otc mucinex. Coughing up thick white looking mucus, but can not seem to get it all up.     HPI: Pt is a 77 yo female with pmh sig for seasonal allergies arthritis, anxiety, HTN, IBS, HLD, GERD who is followed by Dr. Fabian Sharp and seen for acute concern. Pt seen at UC 5 days ago for productive cough.  Given Z-Pak and Tessalon.  Worsens symptoms despite finishing Z-Pak today. Endorses decreased appetite, diarrhea, SOB, wheezing, productive cough Denies ear pain/pressure, rhinorrhea, headaches, fever, chills, sick contacts. Started taking 2 OTC antihistamines daily in the last month.  Tick bite on stomach 3 d ago.  Removed tick.  Mild redness at site of bite.   ROS: See pertinent positives and negatives per HPI.  Past Medical History:  Diagnosis Date   Adenomatous colon polyp    ALLERGIC RHINITIS 06/30/2010   perennial   Allergy    seasonal allergies   Anxiety    hx of   Arthritis    bilateral hands (R>L)   Coughing up blood 08/13/2012   piece of tissue? will send to path poss from upper airway sinusitis  Final path was "abscess"    Depression    hx of   GERD 06/30/2010   on meds    Headache(784.0) 06/30/2010   migraines   Hepatic steatosis    HYPERLIPIDEMIA 06/30/2010   on meds   HYPERTENSION 06/30/2010   on meds   IBS 06/30/2010   Internal hemorrhoids    Renal lesion    Varicose veins    VITAMIN D DEFICIENCY 06/30/2010    Past Surgical History:  Procedure Laterality Date   BREAST CYST ASPIRATION     BREAST SURGERY     needle bx left breast   COLONOSCOPY  2016   KN-prep good-TA   feet     hammer toe sx   POLYPECTOMY  2016   TA   ROTATOR CUFF REPAIR Right    varicose veins      Family History  Problem Relation Age of Onset   Colon cancer Mother        age 38   Colon polyps Mother    Breast cancer Cousin    Arthritis Brother    Other Brother    Colon polyps Brother    Esophageal cancer Neg Hx    Stomach cancer Neg Hx    Rectal cancer Neg Hx     Current Outpatient Medications:    albuterol (VENTOLIN HFA) 108 (90 Base) MCG/ACT inhaler, Inhale 2 puffs by mouth every 6 hours as needed. LAST refill pt needs appt., Disp: 8.5 each, Rfl: 11   atenolol (TENORMIN) 25 MG tablet, TAKE 1 TABLET  BY MOUTH EVERY DAY, Disp: 90 tablet, Rfl: 2   atorvastatin (LIPITOR) 20 MG tablet, Take 1 tablet (20 mg total) by mouth daily., Disp: 90 tablet, Rfl: 1   furosemide (LASIX) 20 MG tablet, TAKE 1 TABLET BY MOUTH EVERY DAY, Disp: 21 tablet, Rfl: 0   hyoscyamine (LEVSIN SL) 0.125 MG SL tablet, DISSOLVE 1 TABLET UNDER THE TONGUE EVERY MORNING AS NEEDED, Disp: 90 tablet, Rfl: 1   omeprazole (PRILOSEC) 20 MG capsule, Take 1 capsule (20 mg total) by mouth 2 (two) times daily before a meal., Disp: 180 capsule, Rfl: 0   ondansetron (ZOFRAN-ODT) 4 MG disintegrating tablet, TAKE 1 TABLET BY MOUTH EVERY 8 HOURS AS NEEDED FOR NAUSEA AND VOMITING, Disp: 30 tablet, Rfl: 0   Probiotic Product (PROBIOTIC ACIDOPHILUS BEADS PO), Take by mouth., Disp: , Rfl:    tiZANidine (ZANAFLEX) 4 MG tablet, TAKE ONE-HALF TO ONE TABLET BY MOUTH AT BEDTIME AS NEEDED FOR NECK PAIN., Disp: 90 tablet,  Rfl: 1   valACYclovir (VALTREX) 1000 MG tablet, Take 2 tablets (2,000 mg total) by mouth 2 (two) times daily., Disp: 30 tablet, Rfl: 1   Wheat Dextrin (BENEFIBER) POWD, Take by mouth., Disp: , Rfl:    acetaminophen (TYLENOL) 325 MG tablet, Take 650 mg by mouth every 6 (six) hours as needed. (Patient not taking: Reported on 12/21/2022), Disp: , Rfl:    Ascorbic Acid (VITAMIN C) 1000 MG tablet, Take 1,000 mg by mouth daily. (Patient not taking: Reported on 10/08/2022), Disp: , Rfl:    carbamide peroxide (DEBROX) 6.5 % OTIC solution, Place 5 drops into both ears 2 (two) times daily. (Patient not taking: Reported on 05/12/2022), Disp: 15 mL, Rfl: 0   cholecalciferol (VITAMIN D3) 25 MCG (1000 UNIT) tablet, Take 1,000 Units by mouth daily. (Patient not taking: Reported on 11/23/2022), Disp: , Rfl:    fluocinonide-emollient (LIDEX-E) 0.05 % cream, Apply 1 Application topically 2 (two) times daily. For itchy bug bites not on face (Patient not taking: Reported on 11/23/2022), Disp: 15 g, Rfl: 1   hyoscyamine (ANASPAZ) 0.125 MG TBDP disintergrating tablet, take 1 every morning under tongue (Patient not taking: Reported on 11/23/2022), Disp: 30 tablet, Rfl: 1   rizatriptan (MAXALT) 10 MG tablet, Take 1 tablet (10 mg total) by mouth as needed for migraine (May repeat after 2 hours.  Maximum 2 tablets in 24 hours.). May repeat in 2 hours if needed (Patient not taking: Reported on 11/23/2022), Disp: 11 tablet, Rfl: 5   vitamin B-12 (CYANOCOBALAMIN) 500 MCG tablet, Take 500 mcg by mouth daily. (Patient not taking: Reported on 12/21/2022), Disp: , Rfl:    Zinc 30 MG CAPS, Take by mouth. (Patient not taking: Reported on 11/23/2022), Disp: , Rfl:   EXAM:  VITALS per patient if applicable: RR between 12 and 20 bpm  GENERAL: alert, oriented, appears well and in no acute distress  HEENT: atraumatic, conjunctiva clear, no obvious abnormalities on inspection of external nose and ears  NECK: normal movements of the head and  neck  LUNGS: Productive cough.  On inspection no signs of respiratory distress, breathing rate appears normal, no obvious gross SOB, gasping or wheezing  CV: no obvious cyanosis  MS: moves all visible extremities without noticeable abnormality  PSYCH/NEURO: pleasant and cooperative, no obvious depression or anxiety, speech and thought processing grossly intact  ASSESSMENT AND PLAN:  Discussed the following assessment and plan:  Chest congestion  Reactive airway disease with acute exacerbation, unspecified asthma severity, unspecified whether persistent - Plan: predniSONE (DELTASONE)  10 MG tablet  Tick bite of abdominal wall, initial encounter - Plan: doxycycline (VIBRA-TABS) 100 MG tablet  Symptoms likely 2/2 viral etiology.  Continue supportive care with OTC medications and Tessalon.  Given history of tobacco use and reactive airway disease treat like COPD exacerbation.  Start prednisone taper and doxycycline.  Doxycycline also to help prevent Lyme disease given recent tick bite.  Given strict precautions.  Follow-up as needed   I discussed the assessment and treatment plan with the patient. The patient was provided an opportunity to ask questions and all were answered. The patient agreed with the plan and demonstrated an understanding of the instructions.   The patient was advised to call back or seek an in-person evaluation if the symptoms worsen or if the condition fails to improve as anticipated.   Deeann Saint, MD

## 2023-01-26 ENCOUNTER — Other Ambulatory Visit: Payer: Self-pay | Admitting: Family Medicine

## 2023-01-26 DIAGNOSIS — J45901 Unspecified asthma with (acute) exacerbation: Secondary | ICD-10-CM

## 2023-01-26 NOTE — Progress Notes (Signed)
Chief Complaint  Patient presents with   Follow-up    Pt is following up on cough. With SOB.   Dizziness    HPI: Michele Monroe 77 y.o. come in for  on going cough  but after weighting this am had onset of difficulty breathing wheezing and st and feeling heard to breath and swallow   no rash new meds fever .  Had breakfast  this am  and few sips of coffee and no  problem except deep cough   Saw dr Salomon Fick 5 17  given doxy and prednisone 50 40 30 20 10   has 3 doses  left of doxy no itching no hives  No dysphagia rash itching edema  or hives . Record review and hx  ROS: See pertinent positives and negatives per HPI.  Past Medical History:  Diagnosis Date   Adenomatous colon polyp    ALLERGIC RHINITIS 06/30/2010   perennial   Allergy    seasonal allergies   Anxiety    hx of   Arthritis    bilateral hands (R>L)   Coughing up blood 08/13/2012   piece of tissue? will send to path poss from upper airway sinusitis  Final path was "abscess"    Depression    hx of   GERD 06/30/2010   on meds   Headache(784.0) 06/30/2010   migraines   Hepatic steatosis    HYPERLIPIDEMIA 06/30/2010   on meds   HYPERTENSION 06/30/2010   on meds   IBS 06/30/2010   Internal hemorrhoids    Renal lesion    Varicose veins    VITAMIN D DEFICIENCY 06/30/2010    Family History  Problem Relation Age of Onset   Colon cancer Mother        age 73   Colon polyps Mother    Breast cancer Cousin    Arthritis Brother    Other Brother    Colon polyps Brother    Esophageal cancer Neg Hx    Stomach cancer Neg Hx    Rectal cancer Neg Hx     Social History   Socioeconomic History   Marital status: Widowed    Spouse name: Not on file   Number of children: 2   Years of education: Not on file   Highest education level: 12th grade  Occupational History   Occupation: retired    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       Lives in condo      3 cups of caffinated coffee per day. Denies soda/tea   Right handed   Social Determinants of Health   Financial Resource Strain: Medium Risk (01/26/2023)   Overall Financial Resource Strain (CARDIA)  Difficulty of Paying Living Expenses: Somewhat hard  Food Insecurity: Food Insecurity Present (01/28/2023)   Hunger Vital Sign    Worried About Running Out of Food in the Last Year: Sometimes true    Ran Out of Food in the Last Year: Sometimes true  Transportation Needs: No Transportation Needs (01/28/2023)   PRAPARE - Administrator, Civil Service (Medical): No    Lack of Transportation (Non-Medical): No  Physical Activity: Insufficiently Active (01/26/2023)   Exercise Vital Sign    Days of Exercise per Week: 5 days    Minutes of Exercise per Session: 10 min  Stress: Stress Concern Present (01/26/2023)   [employer] of Occupational Health - Occupational Stress Questionnaire    Feeling of Stress : To some extent  Social Connections: Unknown (01/26/2023)   Social Connection and Isolation Panel [NHANES]    Frequency of Communication with Friends and Family: More than three times a week    Frequency of Social Gatherings with Friends and Family: More than three times a week    Attends Religious Services: Patient declined    Database administrator or Organizations: No    Attends Banker Meetings: Never    Marital Status: Widowed   Recent Concern: Social Connections - Socially Isolated (01/14/2023)   Social Connection and Isolation Panel [NHANES]    Frequency of Communication with Friends and Family: More than three times a week    Frequency of Social Gatherings with Friends and Family: More than three times a week    Attends Religious Services: Never    Database administrator or Organizations: No    Attends Banker Meetings: Never    Marital Status: Widowed    Outpatient Medications Prior to Visit  Medication Sig Dispense Refill   albuterol (VENTOLIN HFA) 108 (90 Base) MCG/ACT inhaler Inhale 2 puffs by mouth every 6 hours as needed. LAST refill pt needs appt. (Patient taking differently: Inhale 2 puffs into the lungs every 6 (six) hours as needed for wheezing or shortness of breath.) 8.5 each 11   atenolol (TENORMIN) 25 MG tablet TAKE 1 TABLET BY MOUTH EVERY DAY (Patient taking differently: Take 25 mg by mouth daily.) 90 tablet 2   atorvastatin (LIPITOR) 20 MG tablet Take 1 tablet (20 mg total) by mouth daily. 90 tablet 1   fluocinonide-emollient (LIDEX-E) 0.05 % cream Apply 1 Application topically 2 (two) times daily. For itchy bug bites not on face (Patient not taking: Reported on 01/27/2023) 15 g 1   furosemide (LASIX) 20 MG tablet TAKE 1 TABLET BY MOUTH EVERY DAY (Patient taking differently: Take 20 mg by mouth in the morning.) 21 tablet 0   hyoscyamine (LEVSIN SL) 0.125 MG SL tablet DISSOLVE 1 TABLET UNDER THE TONGUE EVERY MORNING AS NEEDED (Patient taking differently: Place 0.125 mg under the tongue See admin instructions. Dissolve 0.125 mg (1 tablet) under the tongue every morning as needed for cramping) 90 tablet 1   omeprazole (PRILOSEC) 20 MG capsule Take 1 capsule (20 mg total) by mouth 2 (two) times daily before a meal. (Patient taking differently: Take 20 mg by mouth daily before breakfast.) 180 capsule 0   rizatriptan (MAXALT) 10 MG tablet Take 1 tablet (10 mg total) by mouth as needed for  migraine (May repeat after 2 hours.  Maximum 2 tablets in 24 hours.). May repeat in 2 hours if needed (Patient taking differently: Take 10 mg by mouth as needed for migraine (May repeat after 2  hours.  Maximum 2 tablets in 24 hours.).) 11 tablet 5   tiZANidine (ZANAFLEX) 4 MG tablet TAKE ONE-HALF TO ONE TABLET BY MOUTH AT BEDTIME AS NEEDED FOR NECK PAIN. (Patient taking differently: Take 4 mg by mouth at bedtime.) 90 tablet 1   acetaminophen (TYLENOL) 325 MG tablet Take 650 mg by mouth every 6 (six) hours as needed.     Ascorbic Acid (VITAMIN C) 1000 MG tablet Take 1,000 mg by mouth daily. (Patient not taking: Reported on 01/27/2023)     carbamide peroxide (DEBROX) 6.5 % OTIC solution Place 5 drops into both ears 2 (two) times daily. (Patient not taking: Reported on 01/27/2023) 15 mL 0   cholecalciferol (VITAMIN D3) 25 MCG (1000 UNIT) tablet Take 1,000 Units by mouth daily. (Patient not taking: Reported on 11/23/2022)     doxycycline (VIBRA-TABS) 100 MG tablet Take 1 tablet (100 mg total) by mouth 2 (two) times daily for 7 days. 14 tablet 0   hyoscyamine (ANASPAZ) 0.125 MG TBDP disintergrating tablet take 1 every morning under tongue (Patient not taking: Reported on 01/27/2023) 30 tablet 1   ondansetron (ZOFRAN-ODT) 4 MG disintegrating tablet TAKE 1 TABLET BY MOUTH EVERY 8 HOURS AS NEEDED FOR NAUSEA AND VOMITING (Patient not taking: Reported on 01/27/2023) 30 tablet 0   predniSONE (DELTASONE) 10 MG tablet Take 5 tabs on day 1, 4 tabs on day 2, 3 tabs on day 3, 2 tabs on day 4, 1 tab on day 5. (Patient not taking: Reported on 01/27/2023) 15 tablet 0   Probiotic Product (PROBIOTIC ACIDOPHILUS BEADS PO) Take by mouth.     valACYclovir (VALTREX) 1000 MG tablet Take 2 tablets (2,000 mg total) by mouth 2 (two) times daily. (Patient not taking: Reported on 01/27/2023) 30 tablet 1   vitamin B-12 (CYANOCOBALAMIN) 500 MCG tablet Take 500 mcg by mouth daily. (Patient not taking: Reported on 12/21/2022)     Wheat Dextrin  (BENEFIBER) POWD Take by mouth.     Zinc 30 MG CAPS Take by mouth. (Patient not taking: Reported on 01/27/2023)     No facility-administered medications prior to visit.     EXAM:  BP (!) 130/98 (BP Location: Right Arm, Patient Position: Sitting, Cuff Size: Normal)   Pulse 88   Temp 97.8 F (36.6 C) (Oral)   Ht 5\' 5"  (1.651 m)   Wt 168 lb 9.6 oz (76.5 kg)   SpO2 95%   BMI 28.06 kg/m   Body mass index is 28.06 kg/m.  GENERAL: vitals reviewed and listed above, alert, oriented,with good color and mild hoarseness  complaining of hard to breath and upper airway sounds  and wheeze  apprehensive  HEENT: atraumatic, conjunctiva  clear, no obvious abnormalities on inspection of external nose and ears OP : no lesion edema or exudate  NECK: no obvious masses on inspection palpation  LUNGS: cwheezing and  some uuper airway intermittent sounds  cough is bronchial and deep CV: HRRR, no clubbing cyanosis or  peripheral edema nl cap refill  MS: moves all extremities without noticeable focal  abnormality nl color  PSYCH: pleasant and cooperative,  Lab Results  Component Value Date   WBC 10.5 01/28/2023   HGB 12.8 01/28/2023   HCT 38.5 01/28/2023   PLT 209 01/28/2023   GLUCOSE 103 (H) 01/28/2023   CHOL 162 04/08/2021   TRIG 185.0 (H) 04/08/2021   HDL 49.90 04/08/2021   LDLCALC 75 04/08/2021   ALT 13 12/21/2022   AST 21 12/21/2022   NA 140 01/28/2023  K 4.1 01/28/2023   CL 105 01/28/2023   CREATININE 0.74 01/28/2023   BUN 19 01/28/2023   CO2 26 01/28/2023   TSH 1.31 12/21/2022   HGBA1C 5.6 12/21/2022   MICROALBUR <0.7 12/21/2022   BP Readings from Last 3 Encounters:  01/29/23 (!) 122/92  01/27/23 (!) 130/98  12/21/22 112/66   After duoneb  improvement in sx dec wheezing  bu after coughing  would get  insp  stridor incomplete   color good   Another 1/2 rx given and calms her breathing  120 depmedrol given .  Pulse 88 and pulse 95 ra  ASSESSMENT AND PLAN:  Discussed the  following assessment and plan:  Acute bronchospasm - w stridor  in office no hx of same  response to nebulizer  but still mild hhoarse if coughs acts like pertusus cough inst stridor with cough. had z pack  at [facility] - Plan: DG Chest 2 View, DISCONTINUED: methylPREDNISolone acetate (DEPO-MEDROL) injection 80 mg, DISCONTINUED: methylPREDNISolone acetate (DEPO-MEDROL) injection 40 mg, DISCONTINUED: ipratropium-albuterol (DUONEB) 0.5-2.5 (3) MG/3ML nebulizer solution 3 mL  Laryngospasm with stridor - dosen nto appear acute allergy but from  ongoing coughing illness but  acute onset in office . - Plan: DISCONTINUED: methylPREDNISolone acetate (DEPO-MEDROL) injection 80 mg, DISCONTINUED: methylPREDNISolone acetate (DEPO-MEDROL) injection 40 mg, DISCONTINUED: ipratropium-albuterol (DUONEB) 0.5-2.5 (3) MG/3ML nebulizer solution 3 mL  Cough, persistent - Plan: DG Chest 2 View, DISCONTINUED: methylPREDNISolone acetate (DEPO-MEDROL) injection 80 mg, DISCONTINUED: methylPREDNISolone acetate (DEPO-MEDROL) injection 40 mg, DISCONTINUED: ipratropium-albuterol (DUONEB) 0.5-2.5 (3) MG/3ML nebulizer solution 3 mL  Respiratory distress intermittent Recurrent in office after watching  45 minutes  gets episode of resp distress and spasm   oxygenation is stable 95-97  like whooping    Looks in quite distress when  episodes occur  and cp hard to breath and she lives alone and  is apprehensive with attacks   although bronchospasm some improved with duoneb x 1.5  on going.   Plan  best observed evaluate in ED . For ongoing attacks of stridor and resp distress EMS called and transported to ED. Pulse ox stable -Patient advised to return or notify health care team  if  new concerns arise.  There are no Patient Instructions on file for this visit.   Neta Mends. Nyle Limb M.D.

## 2023-01-27 ENCOUNTER — Encounter: Payer: Self-pay | Admitting: Internal Medicine

## 2023-01-27 ENCOUNTER — Other Ambulatory Visit: Payer: Self-pay

## 2023-01-27 ENCOUNTER — Observation Stay (HOSPITAL_COMMUNITY)
Admission: EM | Admit: 2023-01-27 | Discharge: 2023-01-29 | Disposition: A | Discharge status: Home / Self Care | Payer: Medicare Other | Attending: Internal Medicine | Admitting: Internal Medicine

## 2023-01-27 ENCOUNTER — Encounter (HOSPITAL_COMMUNITY): Payer: Self-pay

## 2023-01-27 ENCOUNTER — Emergency Department (HOSPITAL_COMMUNITY): Payer: Medicare Other

## 2023-01-27 ENCOUNTER — Ambulatory Visit (INDEPENDENT_AMBULATORY_CARE_PROVIDER_SITE_OTHER): Payer: Medicare Other | Admitting: Internal Medicine

## 2023-01-27 VITALS — BP 130/98 | HR 88 | Temp 97.8°F | Ht 65.0 in | Wt 168.6 lb

## 2023-01-27 DIAGNOSIS — R0602 Shortness of breath: Secondary | ICD-10-CM | POA: Diagnosis not present

## 2023-01-27 DIAGNOSIS — Z79899 Other long term (current) drug therapy: Secondary | ICD-10-CM | POA: Diagnosis not present

## 2023-01-27 DIAGNOSIS — Z743 Need for continuous supervision: Secondary | ICD-10-CM | POA: Diagnosis not present

## 2023-01-27 DIAGNOSIS — I10 Essential (primary) hypertension: Secondary | ICD-10-CM | POA: Diagnosis not present

## 2023-01-27 DIAGNOSIS — J3489 Other specified disorders of nose and nasal sinuses: Secondary | ICD-10-CM | POA: Diagnosis not present

## 2023-01-27 DIAGNOSIS — Z87891 Personal history of nicotine dependence: Secondary | ICD-10-CM | POA: Insufficient documentation

## 2023-01-27 DIAGNOSIS — R053 Chronic cough: Secondary | ICD-10-CM

## 2023-01-27 DIAGNOSIS — J385 Laryngeal spasm: Secondary | ICD-10-CM | POA: Diagnosis not present

## 2023-01-27 DIAGNOSIS — J40 Bronchitis, not specified as acute or chronic: Secondary | ICD-10-CM | POA: Diagnosis not present

## 2023-01-27 DIAGNOSIS — R059 Cough, unspecified: Secondary | ICD-10-CM | POA: Diagnosis not present

## 2023-01-27 DIAGNOSIS — R6889 Other general symptoms and signs: Secondary | ICD-10-CM | POA: Diagnosis not present

## 2023-01-27 DIAGNOSIS — R0603 Acute respiratory distress: Secondary | ICD-10-CM | POA: Diagnosis not present

## 2023-01-27 DIAGNOSIS — J9801 Acute bronchospasm: Secondary | ICD-10-CM

## 2023-01-27 DIAGNOSIS — R062 Wheezing: Secondary | ICD-10-CM | POA: Diagnosis not present

## 2023-01-27 DIAGNOSIS — R0902 Hypoxemia: Secondary | ICD-10-CM | POA: Diagnosis not present

## 2023-01-27 DIAGNOSIS — J189 Pneumonia, unspecified organism: Secondary | ICD-10-CM | POA: Diagnosis not present

## 2023-01-27 DIAGNOSIS — R519 Headache, unspecified: Secondary | ICD-10-CM | POA: Diagnosis present

## 2023-01-27 LAB — I-STAT CHEM 8, ED
BUN: 21 mg/dL (ref 8–23)
Calcium, Ion: 1.18 mmol/L (ref 1.15–1.40)
Chloride: 98 mmol/L (ref 98–111)
Creatinine, Ser: 0.9 mg/dL (ref 0.44–1.00)
Glucose, Bld: 114 mg/dL — ABNORMAL HIGH (ref 70–99)
HCT: 43 % (ref 36.0–46.0)
Hemoglobin: 14.6 g/dL (ref 12.0–15.0)
Potassium: 3.7 mmol/L (ref 3.5–5.1)
Sodium: 140 mmol/L (ref 135–145)
TCO2: 31 mmol/L (ref 22–32)

## 2023-01-27 LAB — BASIC METABOLIC PANEL
Anion gap: 11 (ref 5–15)
BUN: 20 mg/dL (ref 8–23)
CO2: 27 mmol/L (ref 22–32)
Calcium: 9 mg/dL (ref 8.9–10.3)
Chloride: 101 mmol/L (ref 98–111)
Creatinine, Ser: 1.05 mg/dL — ABNORMAL HIGH (ref 0.44–1.00)
GFR, Estimated: 55 mL/min — ABNORMAL LOW (ref >60)
Glucose, Bld: 119 mg/dL — ABNORMAL HIGH (ref 70–99)
Potassium: 3.7 mmol/L (ref 3.5–5.1)
Sodium: 139 mmol/L (ref 135–145)

## 2023-01-27 LAB — CBC WITH DIFFERENTIAL/PLATELET
Abs Immature Granulocytes: 0.24 10*3/uL — ABNORMAL HIGH (ref 0.00–0.07)
Basophils Absolute: 0.1 10*3/uL (ref 0.0–0.1)
Basophils Relative: 1 %
Eosinophils Absolute: 0.7 10*3/uL — ABNORMAL HIGH (ref 0.0–0.5)
Eosinophils Relative: 5 %
HCT: 43.9 % (ref 36.0–46.0)
Hemoglobin: 14.3 g/dL (ref 12.0–15.0)
Immature Granulocytes: 2 %
Lymphocytes Relative: 20 %
Lymphs Abs: 2.5 10*3/uL (ref 0.7–4.0)
MCH: 28.3 pg (ref 26.0–34.0)
MCHC: 32.6 g/dL (ref 30.0–36.0)
MCV: 86.9 fL (ref 80.0–100.0)
Monocytes Absolute: 1.3 10*3/uL — ABNORMAL HIGH (ref 0.1–1.0)
Monocytes Relative: 11 %
Neutro Abs: 7.6 10*3/uL (ref 1.7–7.7)
Neutrophils Relative %: 61 %
Platelets: 269 10*3/uL (ref 150–400)
RBC: 5.05 MIL/uL (ref 3.87–5.11)
RDW: 12.5 % (ref 11.5–15.5)
WBC: 12.5 10*3/uL — ABNORMAL HIGH (ref 4.0–10.5)
nRBC: 0 % (ref 0.0–0.2)

## 2023-01-27 LAB — BRAIN NATRIURETIC PEPTIDE: B Natriuretic Peptide: 38.3 pg/mL (ref 0.0–100.0)

## 2023-01-27 MED ORDER — SODIUM CHLORIDE 0.9 % IV SOLN
500.0000 mg | INTRAVENOUS | Status: DC
  Administered 2023-01-27 – 2023-01-28 (×2): 500 mg via INTRAVENOUS
  Filled 2023-01-27 (×4): qty 5

## 2023-01-27 MED ORDER — METHYLPREDNISOLONE ACETATE 40 MG/ML IJ SUSP
40.0000 mg | Freq: Once | INTRAMUSCULAR | Status: DC
Start: 2023-01-27 — End: 2023-01-27
  Administered 2023-01-27: 40 mg via INTRAMUSCULAR

## 2023-01-27 MED ORDER — ENOXAPARIN SODIUM 40 MG/0.4ML IJ SOSY
40.0000 mg | PREFILLED_SYRINGE | INTRAMUSCULAR | Status: DC
  Administered 2023-01-27 – 2023-01-28 (×2): 40 mg via SUBCUTANEOUS
  Filled 2023-01-27 (×2): qty 0.4

## 2023-01-27 MED ORDER — TRAZODONE HCL 50 MG PO TABS
25.0000 mg | ORAL_TABLET | Freq: Every evening | ORAL | Status: DC | PRN
  Administered 2023-01-28: 25 mg via ORAL
  Filled 2023-01-27: qty 1

## 2023-01-27 MED ORDER — ACETAMINOPHEN 325 MG PO TABS
650.0000 mg | ORAL_TABLET | Freq: Four times a day (QID) | ORAL | Status: DC | PRN
  Administered 2023-01-27 – 2023-01-28 (×2): 650 mg via ORAL
  Filled 2023-01-27 (×2): qty 2

## 2023-01-27 MED ORDER — FUROSEMIDE 40 MG PO TABS
20.0000 mg | ORAL_TABLET | Freq: Every day | ORAL | Status: DC
  Administered 2023-01-28 – 2023-01-29 (×2): 20 mg via ORAL
  Filled 2023-01-27 (×2): qty 1

## 2023-01-27 MED ORDER — IOHEXOL 300 MG/ML  SOLN
80.0000 mL | Freq: Once | INTRAMUSCULAR | Status: AC | PRN
  Administered 2023-01-27: 100 mL via INTRAVENOUS

## 2023-01-27 MED ORDER — BENZONATATE 100 MG PO CAPS: 100.0000 mg | ORAL_CAPSULE | Freq: Three times a day (TID) | ORAL | Status: DC | PRN

## 2023-01-27 MED ORDER — SODIUM CHLORIDE 0.9 % IV BOLUS
500.0000 mL | Freq: Once | INTRAVENOUS | Status: AC
  Administered 2023-01-27: 500 mL via INTRAVENOUS

## 2023-01-27 MED ORDER — VALACYCLOVIR HCL 500 MG PO TABS: 2000.0000 mg | ORAL_TABLET | Freq: Two times a day (BID) | ORAL | Status: DC

## 2023-01-27 MED ORDER — METHYLPREDNISOLONE SODIUM SUCC 40 MG IJ SOLR
40.0000 mg | Freq: Two times a day (BID) | INTRAMUSCULAR | Status: DC
  Administered 2023-01-28 – 2023-01-29 (×3): 40 mg via INTRAVENOUS
  Filled 2023-01-27 (×4): qty 1

## 2023-01-27 MED ORDER — ONDANSETRON HCL 4 MG PO TABS: 4.0000 mg | ORAL_TABLET | Freq: Four times a day (QID) | ORAL | Status: DC | PRN

## 2023-01-27 MED ORDER — ACETAMINOPHEN 500 MG PO TABS
1000.0000 mg | ORAL_TABLET | Freq: Once | ORAL | Status: AC
  Administered 2023-01-27: 1000 mg via ORAL
  Filled 2023-01-27: qty 2

## 2023-01-27 MED ORDER — IPRATROPIUM-ALBUTEROL 0.5-2.5 (3) MG/3ML IN SOLN
3.0000 mL | Freq: Four times a day (QID) | RESPIRATORY_TRACT | Status: DC
  Administered 2023-01-27 – 2023-01-28 (×2): 3 mL via RESPIRATORY_TRACT
  Filled 2023-01-27 (×3): qty 3

## 2023-01-27 MED ORDER — SODIUM CHLORIDE 0.9 % IV SOLN
1.0000 g | INTRAVENOUS | Status: DC
  Administered 2023-01-27 – 2023-01-28 (×2): 1 g via INTRAVENOUS
  Filled 2023-01-27 (×3): qty 10

## 2023-01-27 MED ORDER — ONDANSETRON HCL 4 MG/2ML IJ SOLN: 4.0000 mg | Freq: Four times a day (QID) | INTRAMUSCULAR | Status: DC | PRN

## 2023-01-27 MED ORDER — ALBUTEROL SULFATE (2.5 MG/3ML) 0.083% IN NEBU: 2.5000 mg | INHALATION_SOLUTION | RESPIRATORY_TRACT | Status: DC | PRN

## 2023-01-27 MED ORDER — IPRATROPIUM-ALBUTEROL 0.5-2.5 (3) MG/3ML IN SOLN
3.0000 mL | Freq: Once | RESPIRATORY_TRACT | Status: DC
Start: 2023-01-27 — End: 2023-01-27
  Administered 2023-01-27: 4.5 mL via RESPIRATORY_TRACT

## 2023-01-27 MED ORDER — ATORVASTATIN CALCIUM 10 MG PO TABS
20.0000 mg | ORAL_TABLET | Freq: Every day | ORAL | Status: DC
  Administered 2023-01-28 – 2023-01-29 (×2): 20 mg via ORAL
  Filled 2023-01-27 (×2): qty 2

## 2023-01-27 MED ORDER — PANTOPRAZOLE SODIUM 40 MG PO TBEC
40.0000 mg | DELAYED_RELEASE_TABLET | Freq: Every day | ORAL | Status: DC
  Administered 2023-01-28 – 2023-01-29 (×2): 40 mg via ORAL
  Filled 2023-01-27 (×2): qty 1

## 2023-01-27 MED ORDER — ATENOLOL 50 MG PO TABS
25.0000 mg | ORAL_TABLET | Freq: Every day | ORAL | Status: DC
  Administered 2023-01-28 – 2023-01-29 (×2): 25 mg via ORAL
  Filled 2023-01-27 (×2): qty 1

## 2023-01-27 MED ORDER — ACETAMINOPHEN 650 MG RE SUPP: 650.0000 mg | Freq: Four times a day (QID) | RECTAL | Status: DC | PRN

## 2023-01-27 MED ORDER — SUMATRIPTAN SUCCINATE 50 MG PO TABS
50.0000 mg | ORAL_TABLET | ORAL | Status: AC | PRN
  Administered 2023-01-27 – 2023-01-28 (×2): 50 mg via ORAL
  Filled 2023-01-27 (×4): qty 1

## 2023-01-27 MED ORDER — METHYLPREDNISOLONE ACETATE 80 MG/ML IJ SUSP
80.0000 mg | Freq: Once | INTRAMUSCULAR | Status: DC
Start: 2023-01-27 — End: 2023-01-27
  Administered 2023-01-27: 80 mg via INTRAMUSCULAR

## 2023-01-27 NOTE — H&P (Signed)
History and Physical  Michele Monroe WGN:[id number] DOB: 12-05-1945 DOA: 01/27/2023  PCP: [name], MD   Chief Complaint: cough, wheezing   HPI: Michele Monroe is a 77 y.o. female with medical history significant for seasonal allergies, GERD, depression being admitted to the hospital with persistent cough, wheezing and shortness of breath.  Patient started having significant cough about 3 weeks ago, she went to urgent care was prescribed oral azithromycin and steroid taper at that time.  She completed that course, still did not feel better and was prescribed a course of oral doxycycline about 6 days ago.  She has 1 day of those antibiotics left.  Despite this, she has persistent cough.  Denies any chest pain, fever, productive cough.  She admits to some anxiety, when she gets into coughing spells, and at that time it feels like her throat is closing up.  She went to see her PCP once again today, where she had multiple severe coughing spells, felt to have upper airway bronchospasm.  She was given multiple DuoNeb treatments, as well as Depo-Medrol injection.  After this, she seemed to improve but was sent to the ER for further evaluation.  ED Course: In the emergency department, vital signs are normal on room air saturating 100%.  Lab work was done which shows borderline leukocytosis, but otherwise unremarkable.  Chest x-ray as noted below with hazy right hilar opacity.  CT of the neck was also done due to complaints of stridor/fullness and was unremarkable for acute findings.  Review of Systems: Please see HPI for pertinent positives and negatives. A complete 10 system review of systems are otherwise negative.  Past Medical History:  Diagnosis Date   Adenomatous colon polyp    ALLERGIC RHINITIS 06/30/2010   perennial   Allergy    seasonal allergies   Anxiety    hx of   Arthritis    bilateral hands (R>L)   Coughing up blood 08/13/2012   piece of tissue? will send to path poss from upper  airway sinusitis  Final path was "abscess"    Depression    hx of   GERD 06/30/2010   on meds   Headache(784.0) 06/30/2010   migraines   Hepatic steatosis    HYPERLIPIDEMIA 06/30/2010   on meds   HYPERTENSION 06/30/2010   on meds   IBS 06/30/2010   Internal hemorrhoids    Renal lesion    Varicose veins    VITAMIN D DEFICIENCY 06/30/2010   Past Surgical History:  Procedure Laterality Date   BREAST CYST ASPIRATION     BREAST SURGERY     needle bx left breast   COLONOSCOPY  2016   KN-prep good-TA   feet     hammer toe sx   POLYPECTOMY  2016   TA   ROTATOR CUFF REPAIR Right    varicose veins      Social History:  reports that she quit smoking about 46 years ago. Her smoking use included cigarettes. She has never used smokeless tobacco. She reports that she does not drink alcohol and does not use drugs.   No Known Allergies  Family History  Problem Relation Age of Onset   Colon cancer Mother        age 58   Colon polyps Mother    Breast cancer Cousin    Arthritis Brother    Other Brother    Colon polyps Brother    Esophageal cancer Neg Hx    Stomach cancer Neg  Hx    Rectal cancer Neg Hx      Prior to Admission medications   Medication Sig Start Date End Date Taking? Authorizing Provider  acetaminophen (TYLENOL) 325 MG tablet Take 650 mg by mouth every 6 (six) hours as needed. Patient not taking: Reported on 12/21/2022    [provider]  albuterol (VENTOLIN HFA) 108 (90 Base) MCG/ACT inhaler Inhale 2 puffs by mouth every 6 hours as needed. LAST refill pt needs appt. 03/15/22   Panosh, Neta Mends, MD  Ascorbic Acid (VITAMIN C) 1000 MG tablet Take 1,000 mg by mouth daily. Patient not taking: Reported on 10/08/2022    [provider]  atenolol (TENORMIN) 25 MG tablet TAKE 1 TABLET BY MOUTH EVERY DAY 12/09/22   Panosh, Neta Mends, MD  atorvastatin (LIPITOR) 20 MG tablet Take 1 tablet (20 mg total) by mouth daily. 11/23/22   Panosh, Neta Mends, MD  carbamide  peroxide (DEBROX) 6.5 % OTIC solution Place 5 drops into both ears 2 (two) times daily. Patient not taking: Reported on 05/12/2022 10/16/20   Just, Azalee Course, FNP  cholecalciferol (VITAMIN D3) 25 MCG (1000 UNIT) tablet Take 1,000 Units by mouth daily. Patient not taking: Reported on 11/23/2022    [provider]  doxycycline (VIBRA-TABS) 100 MG tablet Take 1 tablet (100 mg total) by mouth 2 (two) times daily for 7 days. 01/21/23 01/28/23  Deeann Saint, MD  fluocinonide-emollient (LIDEX-E) 0.05 % cream Apply 1 Application topically 2 (two) times daily. For itchy bug bites not on face Patient not taking: Reported on 11/23/2022 05/12/22   Panosh, Neta Mends, MD  furosemide (LASIX) 20 MG tablet TAKE 1 TABLET BY MOUTH EVERY DAY 01/17/23   Worthy Rancher B, FNP  hyoscyamine (ANASPAZ) 0.125 MG TBDP disintergrating tablet take 1 every morning under tongue Patient not taking: Reported on 11/23/2022 05/11/22   Napoleon Form, MD  hyoscyamine (LEVSIN SL) 0.125 MG SL tablet DISSOLVE 1 TABLET UNDER THE TONGUE EVERY MORNING AS NEEDED 12/09/22   Napoleon Form, MD  omeprazole (PRILOSEC) 20 MG capsule Take 1 capsule (20 mg total) by mouth 2 (two) times daily before a meal. 12/14/22   Panosh, Neta Mends, MD  ondansetron (ZOFRAN-ODT) 4 MG disintegrating tablet TAKE 1 TABLET BY MOUTH EVERY 8 HOURS AS NEEDED FOR NAUSEA AND VOMITING 03/10/22   Panosh, Neta Mends, MD  predniSONE (DELTASONE) 10 MG tablet Take 5 tabs on day 1, 4 tabs on day 2, 3 tabs on day 3, 2 tabs on day 4, 1 tab on day 5. 01/21/23   Deeann Saint, MD  Probiotic Product (PROBIOTIC ACIDOPHILUS BEADS PO) Take by mouth.    [provider]  rizatriptan (MAXALT) 10 MG tablet Take 1 tablet (10 mg total) by mouth as needed for migraine (May repeat after 2 hours.  Maximum 2 tablets in 24 hours.). May repeat in 2 hours if needed Patient not taking: Reported on 11/23/2022 01/08/22   Drema Dallas, DO  tiZANidine (ZANAFLEX) 4 MG tablet TAKE ONE-HALF TO ONE TABLET  BY MOUTH AT BEDTIME AS NEEDED FOR NECK PAIN. 12/09/22   Panosh, Neta Mends, MD  valACYclovir (VALTREX) 1000 MG tablet Take 2 tablets (2,000 mg total) by mouth 2 (two) times daily. 10/27/21   Panosh, Neta Mends, MD  vitamin B-12 (CYANOCOBALAMIN) 500 MCG tablet Take 500 mcg by mouth daily. Patient not taking: Reported on 12/21/2022    [provider]  Wheat Dextrin (BENEFIBER) POWD Take by mouth.    [provider]  Zinc 30 MG CAPS Take by mouth. Patient not taking: Reported on 11/23/2022    [provider]    Physical Exam: BP 113/64   Pulse 82   Temp 97.9 F (36.6 C) (Oral)   Resp (!) 22   Ht 5\' 5"  (1.651 m)   Wt 76.2 kg   SpO2 100%   BMI 27.96 kg/m   General:  Alert, oriented, calm, in no acute distress  Eyes: EOMI, clear conjuctivae, white sclerea Neck: supple, no masses, trachea mildline  Cardiovascular: RRR, no murmurs or rubs, no peripheral edema  Respiratory: Bilateral air entry no tachypnea, no wheezing, she has bilateral basilar mild rhonchi Abdomen: soft, nontender, nondistended, normal bowel tones heard  Skin: dry, no rashes  Musculoskeletal: no joint effusions, normal range of motion  Psychiatric: appropriate affect, normal speech  Neurologic: extraocular muscles intact, clear speech, moving all extremities with intact sensorium          Labs on Admission:  Basic Metabolic Panel: Recent Labs  Lab 01/27/23 1219 01/27/23 1244  NA 139 140  K 3.7 3.7  CL 101 98  CO2 27  --   GLUCOSE 119* 114*  BUN 20 21  CREATININE 1.05* 0.90  CALCIUM 9.0  --    Liver Function Tests: No results for input(s): "AST", "ALT", "ALKPHOS", "BILITOT", "PROT", "ALBUMIN" in the last 168 hours. No results for input(s): "LIPASE", "AMYLASE" in the last 168 hours. No results for input(s): "AMMONIA" in the last 168 hours. CBC: Recent Labs  Lab 01/27/23 1219 01/27/23 1244  WBC 12.5*  --   NEUTROABS 7.6  --   HGB 14.3 14.6  HCT 43.9 43.0  MCV 86.9  --   PLT 269  --     Cardiac Enzymes: No results for input(s): "CKTOTAL", "CKMB", "CKMBINDEX", "TROPONINI" in the last 168 hours.  BNP (last 3 results) Recent Labs    01/27/23 1219  BNP 38.3    ProBNP (last 3 results) No results for input(s): "PROBNP" in the last 8760 hours.  CBG: No results for input(s): "GLUCAP" in the last 168 hours.  Radiological Exams on Admission: CT Soft Tissue Neck W Contrast  Result Date: 01/27/2023 CLINICAL DATA:  Epiglottitis or tonsillitis suspected. EXAM: CT NECK WITH CONTRAST TECHNIQUE: Multidetector CT imaging of the neck was performed using the standard protocol following the bolus administration of intravenous contrast. RADIATION DOSE REDUCTION: This exam was performed according to the departmental dose-optimization program which includes automated exposure control, adjustment of the mA and/or kV according to patient size and/or use of iterative reconstruction technique. CONTRAST:  OMNIPAQUE IOHEXOL 300 MG/ML  SOLN COMPARISON:  None Available. FINDINGS: Pharynx and larynx: Normal. No mass or swelling. Salivary glands: No inflammation, mass, or stone. Thyroid: Normal. Lymph nodes: No suspicious cervical lymphadenopathy. Vascular: Unremarkable. Limited intracranial: Unremarkable. Visualized orbits: Normal. Mastoids and visualized paranasal sinuses: Air-fluid levels in the bilateral maxillary and sphenoid sinuses. Skeleton: Mild cervical spondylosis without high-grade spinal canal stenosis. Upper chest: Unremarkable. Other: None. IMPRESSION: 1. No acute abnormality in the neck. 2. Air-fluid levels in the bilateral maxillary and sphenoid sinuses, correlate for acute sinusitis. Electronically Signed   By: Orvan Falconer M.D.   On: 01/27/2023 13:19   DG Chest 2 View  Result Date: 01/27/2023 CLINICAL DATA:  Cough and shortness of breath EXAM: CHEST - 2 VIEW COMPARISON:  CXR 07/01/16 FINDINGS: The heart size and mediastinal contours are within normal limits. No pleural  effusion. No pneumothorax. There is hazy opacity in  the right infrahilar region which could represent atelectasis or infection. The visualized skeletal structures are unremarkable. IMPRESSION: Hazy opacity in the right infrahilar region could represent atelectasis or infection. Electronically Signed   By: Lorenza Cambridge M.D.   On: 01/27/2023 12:11    Assessment/Plan This is a pleasant 77 year old female admitted to the hospital with persistent cough, likely due to combination of reactive airway disease, bronchospasm in the setting of seasonal allergies and questionable community-acquired pneumonia.  He is significantly improved after steroid injection and breathing treatments earlier today with her PCP. -Observation admission -Scheduled DuoNebs, with as needed albuterol inhaler -Tessalon p.o. as needed for cough -Continue IV steroids with Solu-Medrol 40 mg IV twice daily -Empiric IV azithromycin and Rocephin  Essential hypertension-continue home atenolol  Lipidemia-continue home Lipitor  GERD-continue p.o. PPI  DVT prophylaxis: Lovenox     Code Status: Full Code  Consults called: None  Admission status: Observation  Time spent: 49 minutes  Mireyah Chervenak Sharlette Dense MD [facility] Pager [phone]  If 7PM-7AM, please contact night-coverage [email or web address] Password [email or web address]  01/27/2023, 3:09 PM

## 2023-01-27 NOTE — ED Notes (Signed)
Pt stated she normally takes 2 puffs of albuterol daily, however she did not take it d/t her having a doctor's appointment. According to doctor's notes, pt's cough is similar to the whooping cough and has inspiratory stridor with cough.

## 2023-01-27 NOTE — ED Triage Notes (Signed)
Pt BIBA from doctor's office with c/o SOB, cough and wheezing x3weeks. During appointment, she had a coughing episode and wheezing was heard. When pt is not coughing, lung sounds are clear.  2 duonebs given en route,  5mg  albuterol, 120mg  depo-medrol from doctor's office  BP 130/98 HR 80's  95% RA, 100% with duoneb

## 2023-01-27 NOTE — ED Provider Notes (Signed)
[place] EMERGENCY DEPARTMENT AT [facility] Provider Note   CSN: [id number] Arrival date & time: 01/27/23  1012     History  Chief Complaint  Patient presents with   Shortness of Breath    Michele Monroe is a 77 y.o. female.  Patient is a 77 year old female with a history of hypertension hyperlipidemia who presents with shortness of breath.  She said she had a cough for about 3 weeks.  It is productive of some yellow sputum.  She initially was seen at [facility] and was prescribed antibiotics which was Zithromax and a cough medicine.  She also says she got a shot at that time but were not sure what it was.  She did not get any better and did a virtual visit with her PCP about a week ago.  She was started on doxycycline and prednisone taper.  She still has not gotten any better and still has an ongoing cough.  She went to her PCPs office this morning.  At that time she started having these spells where she would start coughing and could not stop.  She appeared to be having some bronchospasm and was unable to talk and was having difficulty breathing during these episodes.  She does feel like her throat is swollen.  She has a sore throat.  She denies any fevers throughout the entirety of this illness.  She has had some nausea but no vomiting.  No prior history of known lung disease.  She got depomedrol and nebulizer treatments prior in the office today prior to transport.       Home Medications Prior to Admission medications   Medication Sig Start Date End Date Taking? Authorizing Provider  acetaminophen (TYLENOL) 325 MG tablet Take 650 mg by mouth every 6 (six) hours as needed. Patient not taking: Reported on 12/21/2022    [provider]  albuterol (VENTOLIN HFA) 108 (90 Base) MCG/ACT inhaler Inhale 2 puffs by mouth every 6 hours as needed. LAST refill pt needs appt. 03/15/22   Panosh, Neta Mends, MD  Ascorbic Acid (VITAMIN C) 1000 MG tablet Take 1,000 mg by mouth  daily. Patient not taking: Reported on 10/08/2022    [provider]  atenolol (TENORMIN) 25 MG tablet TAKE 1 TABLET BY MOUTH EVERY DAY 12/09/22   Panosh, Neta Mends, MD  atorvastatin (LIPITOR) 20 MG tablet Take 1 tablet (20 mg total) by mouth daily. 11/23/22   Panosh, Neta Mends, MD  carbamide peroxide (DEBROX) 6.5 % OTIC solution Place 5 drops into both ears 2 (two) times daily. Patient not taking: Reported on 05/12/2022 10/16/20   Just, Azalee Course, FNP  cholecalciferol (VITAMIN D3) 25 MCG (1000 UNIT) tablet Take 1,000 Units by mouth daily. Patient not taking: Reported on 11/23/2022    [provider]  doxycycline (VIBRA-TABS) 100 MG tablet Take 1 tablet (100 mg total) by mouth 2 (two) times daily for 7 days. 01/21/23 01/28/23  Deeann Saint, MD  fluocinonide-emollient (LIDEX-E) 0.05 % cream Apply 1 Application topically 2 (two) times daily. For itchy bug bites not on face Patient not taking: Reported on 11/23/2022 05/12/22   Panosh, Neta Mends, MD  furosemide (LASIX) 20 MG tablet TAKE 1 TABLET BY MOUTH EVERY DAY 01/17/23   Worthy Rancher B, FNP  hyoscyamine (ANASPAZ) 0.125 MG TBDP disintergrating tablet take 1 every morning under tongue Patient not taking: Reported on 11/23/2022 05/11/22   Napoleon Form, MD  hyoscyamine (LEVSIN SL) 0.125 MG SL tablet DISSOLVE 1 TABLET  UNDER THE TONGUE EVERY MORNING AS NEEDED 12/09/22   Napoleon Form, MD  omeprazole (PRILOSEC) 20 MG capsule Take 1 capsule (20 mg total) by mouth 2 (two) times daily before a meal. 12/14/22   Panosh, Neta Mends, MD  ondansetron (ZOFRAN-ODT) 4 MG disintegrating tablet TAKE 1 TABLET BY MOUTH EVERY 8 HOURS AS NEEDED FOR NAUSEA AND VOMITING 03/10/22   Panosh, Neta Mends, MD  predniSONE (DELTASONE) 10 MG tablet Take 5 tabs on day 1, 4 tabs on day 2, 3 tabs on day 3, 2 tabs on day 4, 1 tab on day 5. 01/21/23   Deeann Saint, MD  Probiotic Product (PROBIOTIC ACIDOPHILUS BEADS PO) Take by mouth.    [provider]  rizatriptan (MAXALT)  10 MG tablet Take 1 tablet (10 mg total) by mouth as needed for migraine (May repeat after 2 hours.  Maximum 2 tablets in 24 hours.). May repeat in 2 hours if needed Patient not taking: Reported on 11/23/2022 01/08/22   Drema Dallas, DO  tiZANidine (ZANAFLEX) 4 MG tablet TAKE ONE-HALF TO ONE TABLET BY MOUTH AT BEDTIME AS NEEDED FOR NECK PAIN. 12/09/22   Panosh, Neta Mends, MD  valACYclovir (VALTREX) 1000 MG tablet Take 2 tablets (2,000 mg total) by mouth 2 (two) times daily. 10/27/21   Panosh, Neta Mends, MD  vitamin B-12 (CYANOCOBALAMIN) 500 MCG tablet Take 500 mcg by mouth daily. Patient not taking: Reported on 12/21/2022    [provider]  Wheat Dextrin (BENEFIBER) POWD Take by mouth.    [provider]  Zinc 30 MG CAPS Take by mouth. Patient not taking: Reported on 11/23/2022    [provider]      Allergies    Patient has no known allergies.    Review of Systems   Review of Systems  Constitutional:  Negative for chills, diaphoresis, fatigue and fever.  HENT:  Negative for congestion, rhinorrhea and sneezing.   Eyes: Negative.   Respiratory:  Positive for cough, shortness of breath and stridor (At the doctor's office). Negative for chest tightness.   Cardiovascular:  Negative for chest pain and leg swelling.  Gastrointestinal:  Negative for abdominal pain, blood in stool, diarrhea, nausea and vomiting.  Genitourinary:  Negative for difficulty urinating, flank pain, frequency and hematuria.  Musculoskeletal:  Negative for arthralgias and back pain.  Skin:  Negative for rash.  Neurological:  Negative for dizziness, speech difficulty, weakness, numbness and headaches.    Physical Exam Updated Vital Signs BP 113/64   Pulse 82   Temp 97.9 F (36.6 C) (Oral)   Resp (!) 22   Ht 5\' 5"  (1.651 m)   Wt 76.2 kg   SpO2 100%   BMI 27.96 kg/m  Physical Exam Constitutional:      Appearance: She is well-developed.  HENT:     Head: Normocephalic and atraumatic.      Mouth/Throat:     Pharynx: Posterior oropharyngeal erythema (Mild erythema in the posterior pharynx, no exudates, uvula is midline) present. No oropharyngeal exudate.  Eyes:     Pupils: Pupils are equal, round, and reactive to light.  Cardiovascular:     Rate and Rhythm: Normal rate and regular rhythm.     Heart sounds: Normal heart sounds.  Pulmonary:     Effort: Pulmonary effort is normal. No respiratory distress.     Breath sounds: Normal breath sounds. No wheezing or rales.  Chest:     Chest wall: No tenderness.  Abdominal:  General: Bowel sounds are normal.     Palpations: Abdomen is soft.     Tenderness: There is no abdominal tenderness. There is no guarding or rebound.  Musculoskeletal:        General: Normal range of motion.     Cervical back: Normal range of motion and neck supple.  Lymphadenopathy:     Cervical: No cervical adenopathy.  Skin:    General: Skin is warm and dry.     Findings: No rash.  Neurological:     Mental Status: She is alert and oriented to person, place, and time.     ED Results / Procedures / Treatments   Labs (all labs ordered are listed, but only abnormal results are displayed) Labs Reviewed  BASIC METABOLIC PANEL - Abnormal; Notable for the following components:      Result Value   Glucose, Bld 119 (*)    Creatinine, Ser 1.05 (*)    GFR, Estimated 55 (*)    All other components within normal limits  CBC WITH DIFFERENTIAL/PLATELET - Abnormal; Notable for the following components:   WBC 12.5 (*)    Monocytes Absolute 1.3 (*)    Eosinophils Absolute 0.7 (*)    Abs Immature Granulocytes 0.24 (*)    All other components within normal limits  I-STAT CHEM 8, ED - Abnormal; Notable for the following components:   Glucose, Bld 114 (*)    All other components within normal limits  BRAIN NATRIURETIC PEPTIDE    EKG EKG Interpretation  Date/Time:  Thursday Jan 27 2023 10:34:02 EDT Ventricular Rate:  102 PR Interval:  163 QRS  Duration: 150 QT Interval:  399 QTC Calculation: 528 R Axis:   -14 Text Interpretation: Sinus tachycardia IVCD, consider atypical LBBB since last tracing no significant change Confirmed by Rolan Bucco [phone]) on 01/27/2023 11:58:57 AM  Radiology CT Soft Tissue Neck W Contrast  Result Date: 01/27/2023 CLINICAL DATA:  Epiglottitis or tonsillitis suspected. EXAM: CT NECK WITH CONTRAST TECHNIQUE: Multidetector CT imaging of the neck was performed using the standard protocol following the bolus administration of intravenous contrast. RADIATION DOSE REDUCTION: This exam was performed according to the departmental dose-optimization program which includes automated exposure control, adjustment of the mA and/or kV according to patient size and/or use of iterative reconstruction technique. CONTRAST:  OMNIPAQUE IOHEXOL 300 MG/ML  SOLN COMPARISON:  None Available. FINDINGS: Pharynx and larynx: Normal. No mass or swelling. Salivary glands: No inflammation, mass, or stone. Thyroid: Normal. Lymph nodes: No suspicious cervical lymphadenopathy. Vascular: Unremarkable. Limited intracranial: Unremarkable. Visualized orbits: Normal. Mastoids and visualized paranasal sinuses: Air-fluid levels in the bilateral maxillary and sphenoid sinuses. Skeleton: Mild cervical spondylosis without high-grade spinal canal stenosis. Upper chest: Unremarkable. Other: None. IMPRESSION: 1. No acute abnormality in the neck. 2. Air-fluid levels in the bilateral maxillary and sphenoid sinuses, correlate for acute sinusitis. Electronically Signed   By: Orvan Falconer M.D.   On: 01/27/2023 13:19   DG Chest 2 View  Result Date: 01/27/2023 CLINICAL DATA:  Cough and shortness of breath EXAM: CHEST - 2 VIEW COMPARISON:  CXR 07/01/16 FINDINGS: The heart size and mediastinal contours are within normal limits. No pleural effusion. No pneumothorax. There is hazy opacity in the right infrahilar region which could represent atelectasis or  infection. The visualized skeletal structures are unremarkable. IMPRESSION: Hazy opacity in the right infrahilar region could represent atelectasis or infection. Electronically Signed   By: Lorenza Cambridge M.D.   On: 01/27/2023 12:11    Procedures Procedures  Medications Ordered in ED Medications  sodium chloride 0.9 % bolus 500 mL (0 mLs Intravenous Stopped 01/27/23 1434)  iohexol (OMNIPAQUE) 300 MG/ML solution 80 mL (100 mLs Intravenous Contrast Given 01/27/23 1258)  acetaminophen (TYLENOL) tablet 1,000 mg (1,000 mg Oral Given 01/27/23 1432)    ED Course/ Medical Decision Making/ A&P                             Medical Decision Making Amount and/or Complexity of Data Reviewed Labs: ordered. Radiology: ordered.  Risk OTC drugs. Prescription drug management. Decision regarding hospitalization.   Patient is a 77 year old female who presents with episode of shortness of breath with what sounds like laryngeal spasm this morning after bad coughing spells.  She had 2 or 3 episodes while she was in the doctor's office this morning.  Labs reviewed.  She does have an elevation in her WBC count.  Her blood pressure is a bit soft.  She was given some IV fluids.  Chest x-ray two-view was done which was interpreted by me and confirmed by the radiologist does show an ongoing infiltrate in the right hilar area.  She had a CT scan of her soft tissue neck which did not show any evidence of epiglottitis or airway narrowing.  No other acute abnormality.  She did get a shot of 120 mg of Depo-Medrol in the office.  She has not had any further episodes of shortness of breath.  She is fairly anxious about it and at this point has had the symptoms after 2 rounds of antibiotics including the Zithromax and doxycycline.  She is anxious about going home.  I spoke with the hospitalist who has agreed to admit the patient for observation.  He wanted to see the patient before deciding on IV antibiotics.  Final Clinical  Impression(s) / ED Diagnoses Final diagnoses:  Community acquired pneumonia of right middle lobe of lung  Laryngospasm    Rx / DC Orders ED Discharge Orders     None         Rolan Bucco, MD 01/27/23 1535

## 2023-01-28 DIAGNOSIS — J40 Bronchitis, not specified as acute or chronic: Secondary | ICD-10-CM | POA: Diagnosis not present

## 2023-01-28 LAB — RESPIRATORY PANEL BY PCR

## 2023-01-28 LAB — BASIC METABOLIC PANEL
Anion gap: 9 (ref 5–15)
BUN: 19 mg/dL (ref 8–23)
CO2: 26 mmol/L (ref 22–32)
Calcium: 9 mg/dL (ref 8.9–10.3)
Chloride: 105 mmol/L (ref 98–111)
Creatinine, Ser: 0.74 mg/dL (ref 0.44–1.00)
GFR, Estimated: >60 mL/min (ref >60)
Glucose, Bld: 103 mg/dL — ABNORMAL HIGH (ref 70–99)
Potassium: 4.1 mmol/L (ref 3.5–5.1)
Sodium: 140 mmol/L (ref 135–145)

## 2023-01-28 LAB — CBC
HCT: 38.5 % (ref 36.0–46.0)
Hemoglobin: 12.8 g/dL (ref 12.0–15.0)
MCH: 28.7 pg (ref 26.0–34.0)
MCHC: 33.2 g/dL (ref 30.0–36.0)
MCV: 86.3 fL (ref 80.0–100.0)
Platelets: 209 10*3/uL (ref 150–400)
RBC: 4.46 MIL/uL (ref 3.87–5.11)
RDW: 12.4 % (ref 11.5–15.5)
WBC: 10.5 10*3/uL (ref 4.0–10.5)
nRBC: 0 % (ref 0.0–0.2)

## 2023-01-28 MED ORDER — LORATADINE 10 MG PO TABS
10.0000 mg | ORAL_TABLET | Freq: Every day | ORAL | Status: DC
  Administered 2023-01-28 – 2023-01-29 (×2): 10 mg via ORAL
  Filled 2023-01-28 (×2): qty 1

## 2023-01-28 MED ORDER — GUAIFENESIN ER 600 MG PO TB12
600.0000 mg | ORAL_TABLET | Freq: Two times a day (BID) | ORAL | Status: DC
  Administered 2023-01-28 – 2023-01-29 (×3): 600 mg via ORAL
  Filled 2023-01-28 (×3): qty 1

## 2023-01-28 MED ORDER — FLUTICASONE PROPIONATE 50 MCG/ACT NA SUSP
2.0000 | Freq: Every day | NASAL | Status: DC
  Administered 2023-01-28 – 2023-01-29 (×2): 2 via NASAL
  Filled 2023-01-28: qty 16

## 2023-01-28 NOTE — TOC Initial Note (Signed)
Transition of Care [facility]) - Initial/Assessment Note    Patient Details  Name: Michele Monroe MRN: [id number] Date of Birth: 03/19/1946  Transition of Care [facility]) CM/SW Contact:    Adrian Prows, RN Phone Number: 01/28/2023, 12:43 PM  Clinical Narrative:                 [facility] consult for d/c planning and SDOH risk; spoke w/ pt in room; pt says she is from home and she plans to return at d/c; she identified POC Michele Monroe (dtr) [phone]; pt says she has transportation; she denies experiencing IPV and difficulty paying for  housing; pt has glasses and wheel chair; she also has grab bars in her shower; pt says she does not have HH services or home oxygen; she agrees to receiving recc nebulizer and she des not have an agency preference; contacted Jermaine at [employer] and device will be delivered to pt's room today; pt says she needs assist w/ utilities and food; she says once she buys medications she does not have a lot left; pt says many of her medications are upper tier meds; she could not give names of meds she needs assist w/ obtaining; pt advised to contact her insurance company for formulary meds; pt also encouraged to contact drug manufacturer for assist programs; pt says she was told she should be on Medicaid; pt agrees to receiving resources for financial assistance and social services; resources placed in d/c in d/c instructions; copies of resources also given to pt; pt will contact agencies of choice; no TOC needs.  Expected Discharge Plan: Home/Self Care     Patient Goals and CMS Choice Patient states their goals for this hospitalization and ongoing recovery are:: home          Expected Discharge Plan and Services   Discharge Planning Services: CM Consult Post Acute Care Choice: Durable Medical Equipment (nebulizer) Living arrangements for the past 2 months: Single Family Home                 DME Arranged: Nebulizer machine DME Agency: [employer] Date DME  Agency Contacted: 01/28/23 Time DME Agency Contacted: [phone] Representative spoke with at DME Agency: [employer]            Prior Living Arrangements/Services Living arrangements for the past 2 months: Single Family Home Lives with:: Self Patient language and need for interpreter reviewed:: Yes Do you feel safe going back to the place where you live?: Yes      Need for Family Participation in Patient Care: Yes (Comment) Care giver support system in place?: Yes (comment) Current home services: DME (wheelchair) Criminal Activity/Legal Involvement Pertinent to Current Situation/Hospitalization: No - Comment as needed  Activities of Daily Living Home Assistive Devices/Equipment: None ADL Screening (condition at time of admission) Patient's cognitive ability adequate to safely complete daily activities?: Yes Is the patient deaf or have difficulty hearing?: No Does the patient have difficulty seeing, even when wearing glasses/contacts?: No Does the patient have difficulty concentrating, remembering, or making decisions?: No Patient able to express need for assistance with ADLs?: No Does the patient have difficulty dressing or bathing?: No Independently performs ADLs?: Yes (appropriate for developmental age) Does the patient have difficulty walking or climbing stairs?: No Weakness of Legs: None Weakness of Arms/Hands: None  Permission Sought/Granted Permission sought to share information with : Case Manager Permission granted to share information with : Yes, Verbal Permission Granted  Share Information with NAME: Burnard Bunting, RN, CM  Permission granted to share info w Relationship: Michele Monroe (dtr) [phone]     Emotional Assessment Appearance:: Appears stated age Attitude/Demeanor/Rapport: Gracious Affect (typically observed): Accepting Orientation: : Oriented to Self, Oriented to Place, Oriented to  Time, Oriented to Situation   Psych Involvement: No  (comment)  Admission diagnosis:  Laryngospasm [J38.5] Bronchitis [J40] Community acquired pneumonia of right middle lobe of lung [J18.9] Patient Active Problem List   Diagnosis Date Noted   Bronchitis 01/27/2023   Medication management 05/12/2022   Anticoagulant long-term use 05/12/2022   History of DVT of lower extremity 05/12/2022   Dizziness 05/05/2019   Varicose veins with pain 03/06/2015   Renal lesion 08/10/2014   Recurrent headache 08/10/2014   Hyperlipidemia 08/10/2014   Essential hypertension 08/10/2014   Neck pain on left side 02/08/2014   Allergic rhinitis ? 02/08/2014   Varicose veins of bilateral lower extremities with other complications 09/13/2013   Vasomotor flushing 06/19/2013   Varicose veins 08/13/2012   Mild anemia 08/13/2012   Recurrent acute sinusitis 08/13/2012   Bilateral neck pain 08/13/2012   Stress headaches 08/13/2012   Coughing up blood 08/13/2012   Constipation 07/25/2012   Subcutaneous nodule 10/06/2011   VITAMIN D DEFICIENCY 06/30/2010   HYPERLIPIDEMIA 06/30/2010   HYPERTENSION 06/30/2010   ALLERGIC RHINITIS 06/30/2010   GERD 06/30/2010   IBS 06/30/2010   Headache 06/30/2010   PCP:  [name], MD Pharmacy:   [id number] [place], [place] - [address] [place] [address] Phone: [phone] Fax: [phone]     Social Determinants of Health (SDOH) Social History: SDOH Screenings   Food Insecurity: Food Insecurity Present (01/28/2023)  Housing: Low Risk  (01/28/2023)  Transportation Needs: No Transportation Needs (01/28/2023)  Utilities: Not At Risk (01/28/2023)  Alcohol Screen: Low Risk  (01/14/2023)  Depression (PHQ2-9): Low Risk  (01/14/2023)  Recent Concern: Depression (PHQ2-9) - Medium Risk (12/21/2022)  Financial Resource Strain: Medium Risk (01/26/2023)  Physical Activity: Insufficiently Active (01/26/2023)  Social Connections: Unknown (01/26/2023)  Recent Concern: Social Connections - Socially  Isolated (01/14/2023)  Stress: Stress Concern Present (01/26/2023)  Tobacco Use: Medium Risk (01/27/2023)   SDOH Interventions: Food Insecurity Interventions: Inpatient TOC Housing Interventions: Inpatient TOC Transportation Interventions: Inpatient TOC Utilities Interventions: Inpatient TOC   Readmission Risk Interventions     No data to display

## 2023-01-28 NOTE — Progress Notes (Signed)
PROGRESS NOTE    Michele Monroe  NGE:[id number] DOB: [birth year] DOA: 01/27/2023 PCP: [name], MD    Brief Narrative:   Michele Monroe is a 77 y.o. female with past medical history significant for HTN, HLD, GERD, depression, seasonal allergies who presented to [facility] ED on 5/23 with persistent cough, wheezing and shortness of breath.  Patient reports significant cough onset roughly 3 weeks prior to admission.  She went to urgent care and was prescribed oral azithromycin and steroid taper.  She reports compliance with that course but unfortunate did not feel better.  She followed by obtaining oral doxycycline roughly 6 days ago but no significant change in her symptoms.  Denies chest pain, no fever, no productive cough and she reports feels like her "throat is closing up".  She went to see her PCP once again today where she had multiple severe coughing spells and there was concern for upper airway bronchospasm.  She was given multiple DuoNeb treatments as well as Depo-Medrol injection.  After this she seemed to improve but was sent to the ED for further evaluation.  In the ED, temperature 98.0 F, HR 106, RR 18, BP 129/75, SpO2 100% on room air.  WBC 12.5, hemoglobin 14.3, platelets 269.  Sodium 139, potassium 3.7, chloride 101, CO2 27, glucose 119, BUN 20, creatinine 1.05.  BNP 38.3.  Chest x-ray with hazy opacity right infrahilar region could represent atelectasis versus infection.  CT soft tissue neck with no acute abnormality in neck, air-fluid levels in the bilateral maxillary/sphenoid sinuses.  Patient was anxious about discharging home from the ED.  EDP consulted [facility] for admission for further evaluation and management given elevated heart rate, elevated WC count and concern for pneumonia.  Assessment & Plan:   Community-acquired pneumonia Patient presenting to ED with persistent cough, shortness of breath.  Completed outpatient azithromycin followed by doxycycline without  significant improvement.  Received IM Depo-Medrol injection by PCP.  Patient afebrile with elevated heart rate of 106 and WBC count elevated at 12.5.  Chest x-ray with findings concerning for hazy opacity right infrahilar region.  CT soft tissue neck with no acute findings. -- Respiratory viral panel: Pending -- Azithromycin 500 mg IV every 24 hours -- Ceftriaxone 1 g IV every 24 hours -- Solu-Medrol 40 mg IV every 12 hours -- Albuterol neb every 2 hours as needed wheezing -- Mucinex 600 mg p.o. twice daily -- Tessalon Perles 100 mg p.o. 3 times daily as needed cough -- Droplet precautions -- DME Home nebulizer ordered; TOC consult  Essential hypertension -- Atenolol 25 mg p.o. daily -- Furosemide 20 mg p.o. daily  Hyperlipidemia -- Atorvastatin 10 mg p.o. daily  GERD -- Protonix 40 mg p.o. daily  Seasonal allergies -- Claritin 10 mg p.o. daily -- Flonase   DVT prophylaxis: enoxaparin (LOVENOX) injection 40 mg Start: 01/27/23 1600 SCDs Start: 01/27/23 1508    Code Status: Full Code Family Communication: Updated patient's daughter on speaker phone in the room this morning  Disposition Plan:  Level of care: Med-Surg Status is: Observation The patient remains OBS appropriate and will d/c before 2 midnights.    Consultants:  None  Procedures:  None  Antimicrobials:  Azithromycin 5/23>> Ceftriaxone 5/23>>   Subjective: Patient seen examined bedside, resting calmly.  Lying in bed.  Just received albuterol treatment.  Reports that she is "wheezing", but on auscultation not present.  Updated patient's daughter on speaker phone in room this morning.  Patient requesting nebulizer machine  for home, also wants to talk with TOC regarding issues with obtaining food, assistance with utilities etc. no other specific questions or concerns at this time.  Denies headache, no dizziness, no chest pain, no palpitations, no fever/chills/night sweats, no nausea/vomiting/diarrhea, no  abdominal pain, no focal weakness, no fatigue, no paresthesias.  No acute events overnight per nursing staff.  Objective: Vitals:   01/27/23 2136 01/28/23 0021 01/28/23 0400 01/28/23 0833  BP:  138/72 120/68   Pulse:  93 82   Resp:  18 18   Temp:  97.8 F (36.6 C) 97.7 F (36.5 C)   TempSrc:  Oral Oral   SpO2: 97% 93% 97% 98%  Weight:      Height:        Intake/Output Summary (Last 24 hours) at 01/28/2023 1209 Last data filed at 01/28/2023 0701 Gross per 24 hour  Intake 460 ml  Output --  Net 460 ml   Filed Weights   01/27/23 1435  Weight: 76.2 kg    Examination:  Physical Exam: GEN: NAD, alert and oriented x 3, elderly in appearance HEENT: NCAT, PERRL, EOMI, sclera clear, MMM PULM: CTAB w/o wheezes/crackles, normal respiratory effort, on room air with SpO2 97% at rest CV: RRR w/o M/G/R GI: abd soft, NTND, NABS, no R/G/M MSK: no peripheral edema, muscle strength globally intact 5/5 bilateral upper/lower extremities NEURO: CN II-XII intact, no focal deficits, sensation to light touch intact PSYCH: normal mood/affect Integumentary: dry/intact, no rashes or wounds    Data Reviewed: I have personally reviewed following labs and imaging studies  CBC: Recent Labs  Lab 01/27/23 1219 01/27/23 1244 01/28/23 0410  WBC 12.5*  --  10.5  NEUTROABS 7.6  --   --   HGB 14.3 14.6 12.8  HCT 43.9 43.0 38.5  MCV 86.9  --  86.3  PLT 269  --  209   Basic Metabolic Panel: Recent Labs  Lab 01/27/23 1219 01/27/23 1244 01/28/23 0410  NA 139 140 140  K 3.7 3.7 4.1  CL 101 98 105  CO2 27  --  26  GLUCOSE 119* 114* 103*  BUN 20 21 19   CREATININE 1.05* 0.90 0.74  CALCIUM 9.0  --  9.0   GFR: Estimated Creatinine Clearance: 62.1 mL/min (by C-G formula based on SCr of 0.74 mg/dL). Liver Function Tests: No results for input(s): "AST", "ALT", "ALKPHOS", "BILITOT", "PROT", "ALBUMIN" in the last 168 hours. No results for input(s): "LIPASE", "AMYLASE" in the last 168 hours. No  results for input(s): "AMMONIA" in the last 168 hours. Coagulation Profile: No results for input(s): "INR", "PROTIME" in the last 168 hours. Cardiac Enzymes: No results for input(s): "CKTOTAL", "CKMB", "CKMBINDEX", "TROPONINI" in the last 168 hours. BNP (last 3 results) No results for input(s): "PROBNP" in the last 8760 hours. HbA1C: No results for input(s): "HGBA1C" in the last 72 hours. CBG: No results for input(s): "GLUCAP" in the last 168 hours. Lipid Profile: No results for input(s): "CHOL", "HDL", "LDLCALC", "TRIG", "CHOLHDL", "LDLDIRECT" in the last 72 hours. Thyroid Function Tests: No results for input(s): "TSH", "T4TOTAL", "FREET4", "T3FREE", "THYROIDAB" in the last 72 hours. Anemia Panel: No results for input(s): "VITAMINB12", "FOLATE", "FERRITIN", "TIBC", "IRON", "RETICCTPCT" in the last 72 hours. Sepsis Labs: No results for input(s): "PROCALCITON", "LATICACIDVEN" in the last 168 hours.  No results found for this or any previous visit (from the past 240 hour(s)).       Radiology Studies: CT Soft Tissue Neck W Contrast  Result Date: 01/27/2023 CLINICAL DATA:  Epiglottitis or tonsillitis suspected. EXAM: CT NECK WITH CONTRAST TECHNIQUE: Multidetector CT imaging of the neck was performed using the standard protocol following the bolus administration of intravenous contrast. RADIATION DOSE REDUCTION: This exam was performed according to the departmental dose-optimization program which includes automated exposure control, adjustment of the mA and/or kV according to patient size and/or use of iterative reconstruction technique. CONTRAST:  OMNIPAQUE IOHEXOL 300 MG/ML  SOLN COMPARISON:  None Available. FINDINGS: Pharynx and larynx: Normal. No mass or swelling. Salivary glands: No inflammation, mass, or stone. Thyroid: Normal. Lymph nodes: No suspicious cervical lymphadenopathy. Vascular: Unremarkable. Limited intracranial: Unremarkable. Visualized orbits: Normal. Mastoids and  visualized paranasal sinuses: Air-fluid levels in the bilateral maxillary and sphenoid sinuses. Skeleton: Mild cervical spondylosis without high-grade spinal canal stenosis. Upper chest: Unremarkable. Other: None. IMPRESSION: 1. No acute abnormality in the neck. 2. Air-fluid levels in the bilateral maxillary and sphenoid sinuses, correlate for acute sinusitis. Electronically Signed   By: Orvan Falconer M.D.   On: 01/27/2023 13:19   DG Chest 2 View  Result Date: 01/27/2023 CLINICAL DATA:  Cough and shortness of breath EXAM: CHEST - 2 VIEW COMPARISON:  CXR 07/01/16 FINDINGS: The heart size and mediastinal contours are within normal limits. No pleural effusion. No pneumothorax. There is hazy opacity in the right infrahilar region which could represent atelectasis or infection. The visualized skeletal structures are unremarkable. IMPRESSION: Hazy opacity in the right infrahilar region could represent atelectasis or infection. Electronically Signed   By: Lorenza Cambridge M.D.   On: 01/27/2023 12:11        Scheduled Meds:  atenolol  25 mg Oral Daily   atorvastatin  20 mg Oral Daily   enoxaparin (LOVENOX) injection  40 mg Subcutaneous Q24H   furosemide  20 mg Oral Daily   guaiFENesin  600 mg Oral BID   methylPREDNISolone (SOLU-MEDROL) injection  40 mg Intravenous Q12H   pantoprazole  40 mg Oral Daily   Continuous Infusions:  azithromycin Stopped (01/27/23 2334)   cefTRIAXone (ROCEPHIN)  IV 1 g (01/27/23 1625)     LOS: 0 days    Time spent: 53 minutes spent on chart review, discussion with nursing staff, consultants, updating family and interview/physical exam; more than 50% of that time was spent in counseling and/or coordination of care.    Alvira Philips [place], DO [facility] Available via Epic secure chat 7am-7pm After these hours, please refer to coverage provider listed on [email or web address] 01/28/2023, 12:09 PM

## 2023-01-29 DIAGNOSIS — J40 Bronchitis, not specified as acute or chronic: Secondary | ICD-10-CM | POA: Diagnosis not present

## 2023-01-29 DIAGNOSIS — J209 Acute bronchitis, unspecified: Secondary | ICD-10-CM | POA: Diagnosis not present

## 2023-01-29 MED ORDER — GUAIFENESIN ER 600 MG PO TB12: 600.0000 mg | ORAL_TABLET | Freq: Two times a day (BID) | ORAL | 0 refills | Status: AC

## 2023-01-29 MED ORDER — PREDNISONE 20 MG PO TABS
40.0000 mg | ORAL_TABLET | Freq: Every day | ORAL | Status: DC
  Administered 2023-01-29: 40 mg via ORAL
  Filled 2023-01-29: qty 2

## 2023-01-29 MED ORDER — CEFDINIR 300 MG PO CAPS: 300.0000 mg | ORAL_CAPSULE | Freq: Two times a day (BID) | ORAL | 0 refills | Status: AC

## 2023-01-29 MED ORDER — IPRATROPIUM-ALBUTEROL 0.5-2.5 (3) MG/3ML IN SOLN: 3.0000 mL | Freq: Four times a day (QID) | RESPIRATORY_TRACT | 0 refills | Status: AC | PRN

## 2023-01-29 MED ORDER — PREDNISONE 10 MG PO TABS: ORAL_TABLET | ORAL | 0 refills | Status: AC

## 2023-01-29 MED ORDER — AZITHROMYCIN 500 MG PO TABS: 500.0000 mg | ORAL_TABLET | Freq: Every day | ORAL | 0 refills | Status: AC

## 2023-01-29 NOTE — Plan of Care (Signed)
Patient stable for discharge. Patient verbalized understanding of discharge instructions. No signs of symptoms of acute distress at the time of discharge. Problem: Education: Goal: Knowledge of General Education information will improve Description: Including pain rating scale, medication(s)/side effects and non-pharmacologic comfort measures Outcome: Adequate for Discharge   Problem: Health Behavior/Discharge Planning: Goal: Ability to manage health-related needs will improve Outcome: Adequate for Discharge   Problem: Clinical Measurements: Goal: Ability to maintain clinical measurements within normal limits will improve Outcome: Adequate for Discharge Goal: Will remain free from infection Outcome: Adequate for Discharge Goal: Diagnostic test results will improve Outcome: Adequate for Discharge Goal: Respiratory complications will improve Outcome: Adequate for Discharge Goal: Cardiovascular complication will be avoided Outcome: Adequate for Discharge   Problem: Activity: Goal: Risk for activity intolerance will decrease Outcome: Adequate for Discharge   Problem: Nutrition: Goal: Adequate nutrition will be maintained Outcome: Adequate for Discharge   Problem: Coping: Goal: Level of anxiety will decrease Outcome: Adequate for Discharge   Problem: Elimination: Goal: Will not experience complications related to bowel motility Outcome: Adequate for Discharge Goal: Will not experience complications related to urinary retention Outcome: Adequate for Discharge   Problem: Pain Managment: Goal: General experience of comfort will improve Outcome: Adequate for Discharge   Problem: Safety: Goal: Ability to remain free from injury will improve Outcome: Adequate for Discharge   Problem: Skin Integrity: Goal: Risk for impaired skin integrity will decrease Outcome: Adequate for Discharge   Problem: Activity: Goal: Ability to tolerate increased activity will improve Outcome:  Adequate for Discharge   Problem: Clinical Measurements: Goal: Ability to maintain a body temperature in the normal range will improve Outcome: Adequate for Discharge   Problem: Respiratory: Goal: Ability to maintain adequate ventilation will improve Outcome: Adequate for Discharge Goal: Ability to maintain a clear airway will improve Outcome: Adequate for Discharge

## 2023-01-29 NOTE — Discharge Summary (Signed)
Physician Discharge Summary  Michele Monroe:[id number] DOB: Apr 03, 1946 DOA: 01/27/2023  PCP: [name], MD  Admit date: 01/27/2023 Discharge date: 01/29/2023  Admitted From: Home Disposition: Home  Recommendations for Outpatient Follow-up:  Follow up with PCP in 1-2 weeks Continue azithromycin and cefdinir to complete course for commune acquired pneumonia Continue prednisone taper  Home Health: No Equipment/Devices: Nebulizer  Discharge Condition: Stable CODE STATUS: Full code Diet recommendation:   History of present illness:  Michele Monroe is a 76 y.o. female with past medical history significant for HTN, HLD, GERD, depression, seasonal allergies who presented to [facility] ED on 5/23 with persistent cough, wheezing and shortness of breath.  Patient reports significant cough onset roughly 3 weeks prior to admission.  She went to urgent care and was prescribed oral azithromycin and steroid taper.  She reports compliance with that course but unfortunate did not feel better.  She followed by obtaining oral doxycycline roughly 6 days ago but no significant change in her symptoms.  Denies chest pain, no fever, no productive cough and she reports feels like her "throat is closing up".  She went to see her PCP once again today where she had multiple severe coughing spells and there was concern for upper airway bronchospasm.  She was given multiple DuoNeb treatments as well as Depo-Medrol injection.  After this she seemed to improve but was sent to the ED for further evaluation.   In the ED, temperature 98.0 F, HR 106, RR 18, BP 129/75, SpO2 100% on room air.  WBC 12.5, hemoglobin 14.3, platelets 269.  Sodium 139, potassium 3.7, chloride 101, CO2 27, glucose 119, BUN 20, creatinine 1.05.  BNP 38.3.  Chest x-ray with hazy opacity right infrahilar region could represent atelectasis versus infection.  CT soft tissue neck with no acute abnormality in neck, air-fluid levels in the  bilateral maxillary/sphenoid sinuses.  Patient was anxious about discharging home from the ED.  EDP consulted [facility] for admission for further evaluation and management given elevated heart rate, elevated WC count and concern for pneumonia.  Hospital course:  Community-acquired pneumonia Rhinovirus infection Patient presenting to ED with persistent cough, shortness of breath.  Completed outpatient azithromycin followed by doxycycline without significant improvement.  Received IM Depo-Medrol injection by PCP.  Patient afebrile with elevated heart rate of 106 and WBC count elevated at 12.5.  Chest x-ray with findings concerning for hazy opacity right infrahilar region.  CT soft tissue neck with no acute findings.  Respiratory viral panel positive for rhinovirus.  Patient was started on azithromycin and ceftriaxone, IV steroids and supported with neb treatments and mucolytic's.  Patient's symptoms slowly resolved.  Did not require oxygen during her hospitalization.  Patient requesting nebulizer machine for home use.  Will continue prednisone taper on discharge, azithromycin and cefdinir to complete antibiotic course.  Outpatient follow-up with PCP.   Essential hypertension Continue home atenolol 25 mg p.o. daily, Furosemide 20 mg p.o. daily   Hyperlipidemia Continue atorvastatin 10 mg p.o. daily   GERD Continue PPI   Seasonal allergies Claritin 10 mg p.o. daily   Discharge Diagnoses:  Principal Problem:   Bronchitis Active Problems:   HYPERTENSION   Headache    Discharge Instructions  Discharge Instructions     Call MD for:  difficulty breathing, headache or visual disturbances   Complete by: As directed    Call MD for:  extreme fatigue   Complete by: As directed    Call MD for:  persistant dizziness  or light-headedness   Complete by: As directed    Call MD for:  persistant nausea and vomiting   Complete by: As directed    Call MD for:  severe uncontrolled pain   Complete by: As  directed    Call MD for:  temperature >100.4   Complete by: As directed    Diet - low sodium heart healthy   Complete by: As directed    Increase activity slowly   Complete by: As directed       Allergies as of 01/29/2023   No Known Allergies      Medication List     STOP taking these medications    cholecalciferol 25 MCG (1000 UNIT) tablet Commonly known as: VITAMIN D3   Debrox 6.5 % OTIC solution Generic drug: carbamide peroxide   doxycycline 100 MG tablet Commonly known as: VIBRA-TABS   ondansetron 4 MG disintegrating tablet Commonly known as: ZOFRAN-ODT   valACYclovir 1000 MG tablet Commonly known as: Valtrex   vitamin B-12 500 MCG tablet Commonly known as: CYANOCOBALAMIN   vitamin C 1000 MG tablet   Zinc 30 MG Caps       TAKE these medications    albuterol 108 (90 Base) MCG/ACT inhaler Commonly known as: VENTOLIN HFA Inhale 2 puffs by mouth every 6 hours as needed. LAST refill pt needs appt. What changed: See the new instructions.   atenolol 25 MG tablet Commonly known as: TENORMIN TAKE 1 TABLET BY MOUTH EVERY DAY   atorvastatin 20 MG tablet Commonly known as: LIPITOR Take 1 tablet (20 mg total) by mouth daily.   azithromycin 500 MG tablet Commonly known as: Zithromax Take 1 tablet (500 mg total) by mouth daily for 3 days.   benzonatate 200 MG capsule Commonly known as: TESSALON Take 200 mg by mouth 3 (three) times daily as needed for cough.   cefdinir 300 MG capsule Commonly known as: OMNICEF Take 1 capsule (300 mg total) by mouth 2 (two) times daily for 3 days.   Claritin 10 MG tablet Generic drug: loratadine Take 10 mg by mouth daily as needed for allergies or rhinitis.   fluocinonide-emollient 0.05 % cream Commonly known as: LIDEX-E Apply 1 Application topically 2 (two) times daily. For itchy bug bites not on face   furosemide 20 MG tablet Commonly known as: LASIX TAKE 1 TABLET BY MOUTH EVERY DAY What changed: when to take  this   guaiFENesin 600 MG 12 hr tablet Commonly known as: MUCINEX Take 1 tablet (600 mg total) by mouth 2 (two) times daily for 7 days.   hyoscyamine 0.125 MG SL tablet Commonly known as: LEVSIN SL DISSOLVE 1 TABLET UNDER THE TONGUE EVERY MORNING AS NEEDED What changed:  See the new instructions. Another medication with the same name was removed. Continue taking this medication, and follow the directions you see here.   ibuprofen 200 MG tablet Commonly known as: ADVIL Take 400 mg by mouth every 6 (six) hours as needed for headache.   ipratropium-albuterol 0.5-2.5 (3) MG/3ML Soln Commonly known as: DUONEB Take 3 mLs by nebulization every 6 (six) hours as needed (Shortness of breath/wheezing).   omeprazole 20 MG capsule Commonly known as: PRILOSEC Take 1 capsule (20 mg total) by mouth 2 (two) times daily before a meal. What changed: when to take this   Pataday 0.1 % ophthalmic solution Generic drug: olopatadine Place 1 drop into both eyes 2 (two) times daily as needed for allergies.   predniSONE 10 MG tablet Commonly known  as: DELTASONE Take 4 tablets (40 mg total) by mouth daily for 1 day, THEN 3 tablets (30 mg total) daily for 2 days, THEN 2 tablets (20 mg total) daily for 2 days, THEN 1 tablet (10 mg total) daily for 2 days. Start taking on: Jan 30, 2023 What changed: See the new instructions.   rizatriptan 10 MG tablet Commonly known as: MAXALT Take 1 tablet (10 mg total) by mouth as needed for migraine (May repeat after 2 hours.  Maximum 2 tablets in 24 hours.). May repeat in 2 hours if needed What changed: additional instructions   tiZANidine 4 MG tablet Commonly known as: ZANAFLEX TAKE ONE-HALF TO ONE TABLET BY MOUTH AT BEDTIME AS NEEDED FOR NECK PAIN. What changed: See the new instructions.               Durable Medical Equipment  (From admission, onward)           Start     Ordered   01/28/23 0939  For home use only DME Nebulizer machine  Once        Question Answer Comment  Patient needs a nebulizer to treat with the following condition Asthma   Length of Need Lifetime      01/28/23 7829            Follow-up Information     Panosh, Neta Mends, MD. Schedule an appointment as soon as possible for a visit in 1 week(s).   Specialties: Internal Medicine, Pediatrics Contact information: [address] [place] [address] [phone]                No Known Allergies  Consultations: None   Procedures/Studies: CT Soft Tissue Neck W Contrast  Result Date: 01/27/2023 CLINICAL DATA:  Epiglottitis or tonsillitis suspected. EXAM: CT NECK WITH CONTRAST TECHNIQUE: Multidetector CT imaging of the neck was performed using the standard protocol following the bolus administration of intravenous contrast. RADIATION DOSE REDUCTION: This exam was performed according to the departmental dose-optimization program which includes automated exposure control, adjustment of the mA and/or kV according to patient size and/or use of iterative reconstruction technique. CONTRAST:  OMNIPAQUE IOHEXOL 300 MG/ML  SOLN COMPARISON:  None Available. FINDINGS: Pharynx and larynx: Normal. No mass or swelling. Salivary glands: No inflammation, mass, or stone. Thyroid: Normal. Lymph nodes: No suspicious cervical lymphadenopathy. Vascular: Unremarkable. Limited intracranial: Unremarkable. Visualized orbits: Normal. Mastoids and visualized paranasal sinuses: Air-fluid levels in the bilateral maxillary and sphenoid sinuses. Skeleton: Mild cervical spondylosis without high-grade spinal canal stenosis. Upper chest: Unremarkable. Other: None. IMPRESSION: 1. No acute abnormality in the neck. 2. Air-fluid levels in the bilateral maxillary and sphenoid sinuses, correlate for acute sinusitis. Electronically Signed   By: Orvan Falconer M.D.   On: 01/27/2023 13:19   DG Chest 2 View  Result Date: 01/27/2023 CLINICAL DATA:  Cough and shortness of breath EXAM:  CHEST - 2 VIEW COMPARISON:  CXR 07/01/16 FINDINGS: The heart size and mediastinal contours are within normal limits. No pleural effusion. No pneumothorax. There is hazy opacity in the right infrahilar region which could represent atelectasis or infection. The visualized skeletal structures are unremarkable. IMPRESSION: Hazy opacity in the right infrahilar region could represent atelectasis or infection. Electronically Signed   By: Lorenza Cambridge M.D.   On: 01/27/2023 12:11     Subjective: Patient seen examined bedside, resting calmly.  Lying in bed.  No specific complaints this morning.  Ready for discharge home.  Discussed will continue antibiotics and  prednisone taper following discharge.  Patient requesting nebulizer machine for home use.  No other specific questions or concerns at this time.  Denies headache, no dizziness, no chest pain, no palpitations, no fever/chills/night sweats, no nausea/vomiting/diarrhea, no abdominal pain, no focal weakness, no fatigue, no paresthesias.  No acute events overnight per nursing staff.  Discharge Exam: Vitals:   01/29/23 0506 01/29/23 0837  BP: (!) 119/59 (!) 122/92  Pulse: 72 84  Resp: 18   Temp: 97.6 F (36.4 C)   SpO2: 96%    Vitals:   01/28/23 1317 01/28/23 1921 01/29/23 0506 01/29/23 0837  BP: (!) 114/54 121/69 (!) 119/59 (!) 122/92  Pulse: 98 82 72 84  Resp: 16 16 18    Temp: 98.1 F (36.7 C) 98.1 F (36.7 C) 97.6 F (36.4 C)   TempSrc: Oral     SpO2: 97% 95% 96%   Weight:      Height:        Physical Exam: GEN: NAD, alert and oriented x 3, elderly in appearance HEENT: NCAT, PERRL, EOMI, sclera clear, MMM PULM: CTAB w/o wheezes/crackles, normal respiratory effort, on room air with SpO2 96% at rest CV: RRR w/o M/G/R GI: abd soft, NTND, NABS, no R/G/M MSK: no peripheral edema, muscle strength globally intact 5/5 bilateral upper/lower extremities NEURO: No focal neurological deficits appreciated PSYCH: normal  mood/affect Integumentary: No concerning rashes/lesions/wounds noted on exposed skin surfaces    The results of significant diagnostics from this hospitalization (including imaging, microbiology, ancillary and laboratory) are listed below for reference.     Microbiology: Recent Results (from the past 240 hour(s))  Respiratory (~20 pathogens) panel by PCR     Status: Abnormal   Collection Time: 01/28/23 10:45 AM   Specimen: Nasopharyngeal Swab; Respiratory  Result Value Ref Range Status   Adenovirus NOT DETECTED NOT DETECTED Final   Coronavirus 229E NOT DETECTED NOT DETECTED Final    Comment: (NOTE) The Coronavirus on the Respiratory Panel, DOES NOT test for the novel  Coronavirus (2019 nCoV)    Coronavirus HKU1 NOT DETECTED NOT DETECTED Final   Coronavirus NL63 NOT DETECTED NOT DETECTED Final   Coronavirus OC43 NOT DETECTED NOT DETECTED Final   Metapneumovirus NOT DETECTED NOT DETECTED Final   Rhinovirus / Enterovirus DETECTED (A) NOT DETECTED Final   Influenza A NOT DETECTED NOT DETECTED Final   Influenza B NOT DETECTED NOT DETECTED Final   Parainfluenza Virus 1 NOT DETECTED NOT DETECTED Final   Parainfluenza Virus 2 NOT DETECTED NOT DETECTED Final   Parainfluenza Virus 3 NOT DETECTED NOT DETECTED Final   Parainfluenza Virus 4 NOT DETECTED NOT DETECTED Final   Respiratory Syncytial Virus NOT DETECTED NOT DETECTED Final   Bordetella pertussis NOT DETECTED NOT DETECTED Final   Bordetella Parapertussis NOT DETECTED NOT DETECTED Final   Chlamydophila pneumoniae NOT DETECTED NOT DETECTED Final   Mycoplasma pneumoniae NOT DETECTED NOT DETECTED Final    Comment: Performed at [facility], [address], [place], [place] [address]     Labs: BNP (last 3 results) Recent Labs    01/27/23 1219  BNP 38.3   Basic Metabolic Panel: Recent Labs  Lab 01/27/23 1219 01/27/23 1244 01/28/23 0410  NA 139 140 140  K 3.7 3.7 4.1  CL 101 98 105  CO2 27  --  26  GLUCOSE 119* 114*  103*  BUN 20 21 19   CREATININE 1.05* 0.90 0.74  CALCIUM 9.0  --  9.0   Liver Function Tests: No results for  input(s): "AST", "ALT", "ALKPHOS", "BILITOT", "PROT", "ALBUMIN" in the last 168 hours. No results for input(s): "LIPASE", "AMYLASE" in the last 168 hours. No results for input(s): "AMMONIA" in the last 168 hours. CBC: Recent Labs  Lab 01/27/23 1219 01/27/23 1244 01/28/23 0410  WBC 12.5*  --  10.5  NEUTROABS 7.6  --   --   HGB 14.3 14.6 12.8  HCT 43.9 43.0 38.5  MCV 86.9  --  86.3  PLT 269  --  209   Cardiac Enzymes: No results for input(s): "CKTOTAL", "CKMB", "CKMBINDEX", "TROPONINI" in the last 168 hours. BNP: Invalid input(s): "POCBNP" CBG: No results for input(s): "GLUCAP" in the last 168 hours. D-Dimer No results for input(s): "DDIMER" in the last 72 hours. Hgb A1c No results for input(s): "HGBA1C" in the last 72 hours. Lipid Profile No results for input(s): "CHOL", "HDL", "LDLCALC", "TRIG", "CHOLHDL", "LDLDIRECT" in the last 72 hours. Thyroid function studies No results for input(s): "TSH", "T4TOTAL", "T3FREE", "THYROIDAB" in the last 72 hours.  Invalid input(s): "FREET3" Anemia work up No results for input(s): "VITAMINB12", "FOLATE", "FERRITIN", "TIBC", "IRON", "RETICCTPCT" in the last 72 hours. Urinalysis    Component Value Date/Time   COLORURINE YELLOW 10/01/2021 1308   APPEARANCEUR CLEAR 10/01/2021 1308   LABSPEC <=1.005 (A) 10/01/2021 1308   PHURINE 6.5 10/01/2021 1308   GLUCOSEU NEGATIVE 10/01/2021 1308   HGBUR NEGATIVE 10/01/2021 1308   HGBUR negative 08/03/2010 1323   BILIRUBINUR NEGATIVE 10/01/2021 1308   BILIRUBINUR n 08/01/2012 1141   KETONESUR NEGATIVE 10/01/2021 1308   PROTEINUR n 08/01/2012 1141   UROBILINOGEN 0.2 10/01/2021 1308   NITRITE NEGATIVE 10/01/2021 1308   LEUKOCYTESUR SMALL (A) 10/01/2021 1308   Sepsis Labs Recent Labs  Lab 01/27/23 1219 01/28/23 0410  WBC 12.5* 10.5   Microbiology Recent Results (from the past 240  hour(s))  Respiratory (~20 pathogens) panel by PCR     Status: Abnormal   Collection Time: 01/28/23 10:45 AM   Specimen: Nasopharyngeal Swab; Respiratory  Result Value Ref Range Status   Adenovirus NOT DETECTED NOT DETECTED Final   Coronavirus 229E NOT DETECTED NOT DETECTED Final    Comment: (NOTE) The Coronavirus on the Respiratory Panel, DOES NOT test for the novel  Coronavirus (2019 nCoV)    Coronavirus HKU1 NOT DETECTED NOT DETECTED Final   Coronavirus NL63 NOT DETECTED NOT DETECTED Final   Coronavirus OC43 NOT DETECTED NOT DETECTED Final   Metapneumovirus NOT DETECTED NOT DETECTED Final   Rhinovirus / Enterovirus DETECTED (A) NOT DETECTED Final   Influenza A NOT DETECTED NOT DETECTED Final   Influenza B NOT DETECTED NOT DETECTED Final   Parainfluenza Virus 1 NOT DETECTED NOT DETECTED Final   Parainfluenza Virus 2 NOT DETECTED NOT DETECTED Final   Parainfluenza Virus 3 NOT DETECTED NOT DETECTED Final   Parainfluenza Virus 4 NOT DETECTED NOT DETECTED Final   Respiratory Syncytial Virus NOT DETECTED NOT DETECTED Final   Bordetella pertussis NOT DETECTED NOT DETECTED Final   Bordetella Parapertussis NOT DETECTED NOT DETECTED Final   Chlamydophila pneumoniae NOT DETECTED NOT DETECTED Final   Mycoplasma pneumoniae NOT DETECTED NOT DETECTED Final    Comment: Performed at [facility], [address] [place], [place] [address]     Time coordinating discharge: Over 30 minutes  SIGNED:   Alvira Philips [place], DO  [facility] 01/29/2023, 11:32 AM

## 2023-02-04 ENCOUNTER — Other Ambulatory Visit: Payer: Self-pay | Admitting: Family

## 2023-02-04 DIAGNOSIS — R609 Edema, unspecified: Secondary | ICD-10-CM

## 2023-02-07 NOTE — Progress Notes (Signed)
Virtual Visit via Video Note  I connected with Michele Monroe on 02/08/23 at  3:00 PM EDT by a video enabled telemedicine application and verified that I am speaking with the correct person using two identifiers. Location patient: home Location provider:workoffice Persons participating in the virtual visit: patient, provider  Patient aware  of the limitations of evaluation and management by telemedicine and  availability of in person appointments. and agreed to proceed.  HPI: Michele Monroe presents for video visitfu after hosp 5 23 - 5 25  for resp distress and prolonged cough  rx for capd RML :  rx with iv antibiotic ;  azitho and  omnicef and pred taper ; had been on doxy and had had azitho oral as op and had wheezing    still very weak feeling   Feels   if didn't have nebulizer  she uses 2-3 x per day things would not be as good.  Still tired no resp distress. Had 30 + min episode of med left chest  pressure 3/10 with out assoic sx nv sob sweat or radiation . No hx of same  ROS: See pertinent positives and negatives per HPI. No fever  vomiting   Past Medical History:  Diagnosis Date   Adenomatous colon polyp    ALLERGIC RHINITIS 06/30/2010   perennial   Allergy    seasonal allergies   Anxiety    hx of   Arthritis    bilateral hands (R>L)   Coughing up blood 08/13/2012   piece of tissue? will send to path poss from upper airway sinusitis  Final path was "abscess"    Depression    hx of   GERD 06/30/2010   on meds   Headache(784.0) 06/30/2010   migraines   Hepatic steatosis    HYPERLIPIDEMIA 06/30/2010   on meds   HYPERTENSION 06/30/2010   on meds   IBS 06/30/2010   Internal hemorrhoids    Renal lesion    Varicose veins    VITAMIN D DEFICIENCY 06/30/2010    Past Surgical History:  Procedure Laterality Date   BREAST CYST ASPIRATION     BREAST SURGERY     needle bx left breast   COLONOSCOPY  2016   KN-prep good-TA   feet     hammer toe sx   POLYPECTOMY  2016    TA   ROTATOR CUFF REPAIR Right    varicose veins      Family History  Problem Relation Age of Onset   Colon cancer Mother        age 73   Colon polyps Mother    Breast cancer Cousin    Arthritis Brother    Other Brother    Colon polyps Brother    Esophageal cancer Neg Hx    Stomach cancer Neg Hx    Rectal cancer Neg Hx     Social History   Tobacco Use   Smoking status: Former    Types: Cigarettes    Quit date: 09/06/1976    Years since quitting: 46.4   Smokeless tobacco: Never  Vaping Use   Vaping Use: Never used  Substance Use Topics   Alcohol use: No    Alcohol/week: 0.0 standard drinks of alcohol    Comment: rarely   Drug use: No      Current Outpatient Medications:    albuterol (VENTOLIN HFA) 108 (90 Base) MCG/ACT inhaler, Inhale 2 puffs by mouth every 6 hours as needed. LAST refill pt needs  appt. (Patient taking differently: Inhale 2 puffs into the lungs every 6 (six) hours as needed for wheezing or shortness of breath.), Disp: 8.5 each, Rfl: 11   atenolol (TENORMIN) 25 MG tablet, TAKE 1 TABLET BY MOUTH EVERY DAY (Patient taking differently: Take 25 mg by mouth daily.), Disp: 90 tablet, Rfl: 2   atorvastatin (LIPITOR) 20 MG tablet, Take 1 tablet (20 mg total) by mouth daily., Disp: 90 tablet, Rfl: 1   furosemide (LASIX) 20 MG tablet, TAKE 1 TABLET BY MOUTH EVERY DAY (Patient taking differently: Take 20 mg by mouth in the morning.), Disp: 21 tablet, Rfl: 0   hyoscyamine (LEVSIN SL) 0.125 MG SL tablet, DISSOLVE 1 TABLET UNDER THE TONGUE EVERY MORNING AS NEEDED (Patient taking differently: Place 0.125 mg under the tongue See admin instructions. Dissolve 0.125 mg (1 tablet) under the tongue every morning as needed for cramping), Disp: 90 tablet, Rfl: 1   ipratropium-albuterol (DUONEB) 0.5-2.5 (3) MG/3ML SOLN, Take 3 mLs by nebulization every 6 (six) hours as needed (Shortness of breath/wheezing)., Disp: 360 mL, Rfl: 0   omeprazole (PRILOSEC) 20 MG capsule, Take 1 capsule  (20 mg total) by mouth 2 (two) times daily before a meal. (Patient taking differently: Take 20 mg by mouth daily before breakfast.), Disp: 180 capsule, Rfl: 0   PATADAY 0.1 % ophthalmic solution, Place 1 drop into both eyes 2 (two) times daily as needed for allergies., Disp: , Rfl:    rizatriptan (MAXALT) 10 MG tablet, Take 1 tablet (10 mg total) by mouth as needed for migraine (May repeat after 2 hours.  Maximum 2 tablets in 24 hours.). May repeat in 2 hours if needed (Patient taking differently: Take 10 mg by mouth as needed for migraine (May repeat after 2 hours.  Maximum 2 tablets in 24 hours.).), Disp: 11 tablet, Rfl: 5   tiZANidine (ZANAFLEX) 4 MG tablet, TAKE ONE-HALF TO ONE TABLET BY MOUTH AT BEDTIME AS NEEDED FOR NECK PAIN. (Patient taking differently: Take 4 mg by mouth at bedtime.), Disp: 90 tablet, Rfl: 1   benzonatate (TESSALON) 200 MG capsule, Take 200 mg by mouth 3 (three) times daily as needed for cough. (Patient not taking: Reported on 02/08/2023), Disp: , Rfl:    CLARITIN 10 MG tablet, Take 10 mg by mouth daily as needed for allergies or rhinitis. (Patient not taking: Reported on 02/08/2023), Disp: , Rfl:    fluocinonide-emollient (LIDEX-E) 0.05 % cream, Apply 1 Application topically 2 (two) times daily. For itchy bug bites not on face (Patient not taking: Reported on 01/27/2023), Disp: 15 g, Rfl: 1   ibuprofen (ADVIL) 200 MG tablet, Take 400 mg by mouth every 6 (six) hours as needed for headache. (Patient not taking: Reported on 02/08/2023), Disp: , Rfl:   EXAM: BP Readings from Last 3 Encounters:  01/29/23 (!) 122/92  01/27/23 (!) 130/98  12/21/22 112/66    VITALS per patient if applicable:  GENERAL: alert, oriented, appears well and in no acute distress looks non toxic nl speech and resp rate  no cough during visit    HEENT: atraumatic, conjunttiva clear, no obvious abnormalities on inspection of external nose and ears  NECK: normal movements of the head and neck  LUNGS: on  inspection no signs of respiratory distress, breathing rate appears normal, no obvious gross SOB, gasping or wheezing  CV: no obvious cyanosis  MS: moves all visible extremities without noticeable abnormality  PSYCH/NEURO: pleasant and cooperative, no obvious depression or anxiety, speech and thought processing grossly intact Lab  Results  Component Value Date   WBC 10.5 01/28/2023   HGB 12.8 01/28/2023   HCT 38.5 01/28/2023   PLT 209 01/28/2023   GLUCOSE 103 (H) 01/28/2023   CHOL 162 04/08/2021   TRIG 185.0 (H) 04/08/2021   HDL 49.90 04/08/2021   LDLCALC 75 04/08/2021   ALT 13 12/21/2022   AST 21 12/21/2022   NA 140 01/28/2023   K 4.1 01/28/2023   CL 105 01/28/2023   CREATININE 0.74 01/28/2023   BUN 19 01/28/2023   CO2 26 01/28/2023   TSH 1.31 12/21/2022   HGBA1C 5.6 12/21/2022   MICROALBUR <0.7 12/21/2022    ASSESSMENT AND PLAN:  Discussed the following assessment and plan:    ICD-10-CM   1. Pneumonia of right middle lobe due to infectious organism  J18.9 Ambulatory referral to Pulmonology   presumed    2. Hospital discharge follow-up  Z09 Ambulatory referral to Pulmonology    3. Bronchospasm  J98.01 Ambulatory referral to Pulmonology    4. Persistent cough for 3 weeks or longer  R05.3 Ambulatory referral to Pulmonology    5. Medication management  Z79.899 Ambulatory referral to Pulmonology    Although much better than on admission about 60 % bnetter  still feels  nebulizer dependent 2-3 x per day .    Still some cough whith discoloration of expectorant  Episode of chest pressure was no associated sx  observe .  Will need reimage   but perhaps a ct in stead of x ray  Will refer to pulmonary consult for slow to resolve resp infection  and bronchosapsm with continued nebulizer use .  And have them  opine about when and type of imaging that could be helpful   Counseled.   Expectant management and discussion of plan and treatment with opportunity to ask questions  and all were answered. The patient agreed with the plan and demonstrated an understanding of the instructions.   Advised to call back or seek an in-person evaluation if worsening  or having  further concerns  in interim. Return if symptoms worsen or fail to improve, for and referral  after as indicated or if worse before .    Berniece Andreas, MD

## 2023-02-08 ENCOUNTER — Encounter: Payer: Self-pay | Admitting: Internal Medicine

## 2023-02-08 ENCOUNTER — Telehealth (INDEPENDENT_AMBULATORY_CARE_PROVIDER_SITE_OTHER): Payer: Medicare Other | Admitting: Internal Medicine

## 2023-02-08 DIAGNOSIS — R053 Chronic cough: Secondary | ICD-10-CM | POA: Diagnosis not present

## 2023-02-08 DIAGNOSIS — Z79899 Other long term (current) drug therapy: Secondary | ICD-10-CM

## 2023-02-08 DIAGNOSIS — J189 Pneumonia, unspecified organism: Secondary | ICD-10-CM | POA: Diagnosis not present

## 2023-02-08 DIAGNOSIS — Z09 Encounter for follow-up examination after completed treatment for conditions other than malignant neoplasm: Secondary | ICD-10-CM

## 2023-02-08 DIAGNOSIS — J9801 Acute bronchospasm: Secondary | ICD-10-CM

## 2023-02-16 DIAGNOSIS — M9905 Segmental and somatic dysfunction of pelvic region: Secondary | ICD-10-CM | POA: Diagnosis not present

## 2023-02-16 DIAGNOSIS — M9903 Segmental and somatic dysfunction of lumbar region: Secondary | ICD-10-CM | POA: Diagnosis not present

## 2023-02-16 DIAGNOSIS — M9901 Segmental and somatic dysfunction of cervical region: Secondary | ICD-10-CM | POA: Diagnosis not present

## 2023-02-16 DIAGNOSIS — M9904 Segmental and somatic dysfunction of sacral region: Secondary | ICD-10-CM | POA: Diagnosis not present

## 2023-02-18 ENCOUNTER — Other Ambulatory Visit: Payer: Self-pay | Admitting: Family

## 2023-02-18 ENCOUNTER — Telehealth: Payer: Self-pay | Admitting: Internal Medicine

## 2023-02-18 DIAGNOSIS — R609 Edema, unspecified: Secondary | ICD-10-CM

## 2023-02-18 MED ORDER — FUROSEMIDE 20 MG PO TABS: 20.0000 mg | ORAL_TABLET | Freq: Every day | ORAL | 0 refills | Status: DC

## 2023-02-18 NOTE — Telephone Encounter (Signed)
Pt says provider told her she needed another chest x ray, says one was already done on 01/27/23. Also requesting refill of furosemide (LASIX) 20 MG tablet    [id number] [place], [place] - [address] Phone: [phone]  Fax: [phone]

## 2023-02-21 DIAGNOSIS — M9904 Segmental and somatic dysfunction of sacral region: Secondary | ICD-10-CM | POA: Diagnosis not present

## 2023-02-21 DIAGNOSIS — M9905 Segmental and somatic dysfunction of pelvic region: Secondary | ICD-10-CM | POA: Diagnosis not present

## 2023-02-21 DIAGNOSIS — M9903 Segmental and somatic dysfunction of lumbar region: Secondary | ICD-10-CM | POA: Diagnosis not present

## 2023-02-21 DIAGNOSIS — M9901 Segmental and somatic dysfunction of cervical region: Secondary | ICD-10-CM | POA: Diagnosis not present

## 2023-02-28 ENCOUNTER — Ambulatory Visit (INDEPENDENT_AMBULATORY_CARE_PROVIDER_SITE_OTHER): Payer: Medicare Other

## 2023-02-28 ENCOUNTER — Other Ambulatory Visit: Payer: Self-pay

## 2023-02-28 ENCOUNTER — Other Ambulatory Visit: Payer: Medicare Other

## 2023-02-28 DIAGNOSIS — R062 Wheezing: Secondary | ICD-10-CM | POA: Diagnosis not present

## 2023-02-28 DIAGNOSIS — R059 Cough, unspecified: Secondary | ICD-10-CM | POA: Diagnosis not present

## 2023-02-28 DIAGNOSIS — R053 Chronic cough: Secondary | ICD-10-CM | POA: Diagnosis not present

## 2023-02-28 DIAGNOSIS — J9801 Acute bronchospasm: Secondary | ICD-10-CM

## 2023-02-28 NOTE — Progress Notes (Signed)
Pneumonia is gone on x ray  .  I see that your pulmonary appt isnt until August . If  you are still coughing   currently I would like to order  a chest ct  to look closer at lung anatomy .  Let us know if you agree .  And staff order chest ct but keep appt with lung specialists.

## 2023-03-01 ENCOUNTER — Other Ambulatory Visit: Payer: Self-pay

## 2023-03-01 DIAGNOSIS — J9801 Acute bronchospasm: Secondary | ICD-10-CM

## 2023-03-01 DIAGNOSIS — R053 Chronic cough: Secondary | ICD-10-CM

## 2023-03-01 DIAGNOSIS — J209 Acute bronchitis, unspecified: Secondary | ICD-10-CM | POA: Diagnosis not present

## 2023-03-01 NOTE — Progress Notes (Signed)
Without contrast

## 2023-03-09 DIAGNOSIS — M9905 Segmental and somatic dysfunction of pelvic region: Secondary | ICD-10-CM | POA: Diagnosis not present

## 2023-03-09 DIAGNOSIS — M9903 Segmental and somatic dysfunction of lumbar region: Secondary | ICD-10-CM | POA: Diagnosis not present

## 2023-03-09 DIAGNOSIS — M9904 Segmental and somatic dysfunction of sacral region: Secondary | ICD-10-CM | POA: Diagnosis not present

## 2023-03-09 DIAGNOSIS — M9901 Segmental and somatic dysfunction of cervical region: Secondary | ICD-10-CM | POA: Diagnosis not present

## 2023-03-11 ENCOUNTER — Other Ambulatory Visit: Payer: Self-pay | Admitting: Internal Medicine

## 2023-03-11 DIAGNOSIS — R053 Chronic cough: Secondary | ICD-10-CM

## 2023-03-11 DIAGNOSIS — J9801 Acute bronchospasm: Secondary | ICD-10-CM

## 2023-03-12 ENCOUNTER — Other Ambulatory Visit: Payer: Self-pay | Admitting: Internal Medicine

## 2023-03-14 DIAGNOSIS — M9901 Segmental and somatic dysfunction of cervical region: Secondary | ICD-10-CM | POA: Diagnosis not present

## 2023-03-14 DIAGNOSIS — M9905 Segmental and somatic dysfunction of pelvic region: Secondary | ICD-10-CM | POA: Diagnosis not present

## 2023-03-14 DIAGNOSIS — M9903 Segmental and somatic dysfunction of lumbar region: Secondary | ICD-10-CM | POA: Diagnosis not present

## 2023-03-14 DIAGNOSIS — M9904 Segmental and somatic dysfunction of sacral region: Secondary | ICD-10-CM | POA: Diagnosis not present

## 2023-03-15 ENCOUNTER — Other Ambulatory Visit: Payer: Self-pay | Admitting: Internal Medicine

## 2023-03-15 ENCOUNTER — Other Ambulatory Visit: Payer: Self-pay | Admitting: Family

## 2023-03-15 DIAGNOSIS — R609 Edema, unspecified: Secondary | ICD-10-CM

## 2023-03-16 DIAGNOSIS — M9903 Segmental and somatic dysfunction of lumbar region: Secondary | ICD-10-CM | POA: Diagnosis not present

## 2023-03-16 DIAGNOSIS — M9904 Segmental and somatic dysfunction of sacral region: Secondary | ICD-10-CM | POA: Diagnosis not present

## 2023-03-16 DIAGNOSIS — M9901 Segmental and somatic dysfunction of cervical region: Secondary | ICD-10-CM | POA: Diagnosis not present

## 2023-03-16 DIAGNOSIS — M9905 Segmental and somatic dysfunction of pelvic region: Secondary | ICD-10-CM | POA: Diagnosis not present

## 2023-03-21 DIAGNOSIS — M9905 Segmental and somatic dysfunction of pelvic region: Secondary | ICD-10-CM | POA: Diagnosis not present

## 2023-03-21 DIAGNOSIS — M9903 Segmental and somatic dysfunction of lumbar region: Secondary | ICD-10-CM | POA: Diagnosis not present

## 2023-03-21 DIAGNOSIS — M9904 Segmental and somatic dysfunction of sacral region: Secondary | ICD-10-CM | POA: Diagnosis not present

## 2023-03-21 DIAGNOSIS — M9901 Segmental and somatic dysfunction of cervical region: Secondary | ICD-10-CM | POA: Diagnosis not present

## 2023-03-23 NOTE — Progress Notes (Signed)
NEUROLOGY FOLLOW UP OFFICE NOTE  [facility] [id number]  Assessment/Plan:   Migraine without aura, without status migrainosus, not intractable, likely cervicogenic Cervical spondylosis.    1.  Migraine rescue:  Rizatriptan10mg  for migraine; tizanidine for neck pain.  While she has no history of CAD or stroke, age alone is a risk factor (although not an absolute contraindication for taking triptans), I would rather that she not take a triptan.  Will have her try samples of Ubrelvy 100mg  3.  Limit use of pain relievers to no more than 2 days out of week to prevent risk of rebound or medication-overuse headache. 4.  Keep headache diary 5.  Follow up 1 year.    Subjective:  SHAMAREE EYNON is a 77 year old right-handed female with hypertension, GERD, IBS, hyperlipidemia who follows up for migraines.   UPDATE: Migraines have been well-controlled.  Seeing a chiropractor which helps.  No longer on gabapentin. Intensity:  severe Duration:  1 hour with rizatriptan but tries not to take it, then would sometimes last 2 days Frequency:  on average 4 a month (more or less depending on anxiety or weather) Abortive therapy:  Rizatriptan 10mg  for migraines, tramadol for abortive neck and shoulder pain. Anti-emetic:  Zofran ODT 4mg   Muscle relaxant:  Tizanidine 4mg  at bedtime Antihypertensive:  Atenolol 25mg  daily Other treatment:  chiropractic medicine      HISTORY: Onset: 77 years old Location:  Bilateral occipital region Quality:  throbbing Initial intensity:  Starts as 3/10 and progresses to 10/10 She has chronic neck pain that is worse with neck movement.  Migraines often preceded by flare up of neck and shoulder pain. Aura:  no Prodrome:  no Postdrome:  nausea Associated symptoms: Sometimes nausea and vomiting.  Photophobia and phonophobia.  No visual disturbance..  She has not had any new worse headache of her life, waking up from sleep Initial Duration:  Several days without  treatment (30 minutes with Maxalt) Initial Frequency:  2 to 3 days per week Initial Frequency of abortive medication: 2 days per week Triggers/exacerbating factors: Neck movement, change in barometric pressure Relieving factors: None Activity:  Aggravates.  Needs to lay down   Past NSAIDS:  Ibuprofen, Aleve, Excedrin Past analgesics:  Excedrin, Tylenol, tramadol Past abortive triptans:  no Past muscle relaxants:  Flexeril Past anti-emetic:  Yes but uncertain of type Past antihypertensive medications:  no Past antidepressant medications:  Unsure.  Not amitriptyline, nortriptyline or Effexor Past anticonvulsant medications:  topiramate (ineffective), gabapentin Past vitamins/Herbal/Supplements:  no Other past therapies:  heat and ice, exercises, massage for neck pain   Family history of headache:  Daughter has migraines   Report of an old CT of the head from 06/24/05 (images not currently available for review) demonstrated "two areas of low density in the inferior left basal ganglia" felt to be more likely prominent perivascular spaces rather than old lacunar infarcts.  CT of cervical spine on the same day demonstrated multilevel degenerative changes.  MRI of cervical spine from 06/06/17 was personally reviewed and demonstrated multilevel spondylolisthesis with cervical facet arthropathy with severe neural foraminal stenosis at bilateral C6 and left C7.  She was evaluated by interventional pain management who told her nothing interventional was warranted as it was all arthritic changes.  She notices some memory problems.  She may forget why she walked into a room.  Sometimes she needs to use the GPS but not always and not generally getting lost on familiar routes. Able to manage  her finances.  Her family has not been concerned about her memory.   PAST MEDICAL HISTORY: Past Medical History:  Diagnosis Date   Adenomatous colon polyp    ALLERGIC RHINITIS 06/30/2010   perennial   Allergy     seasonal allergies   Anxiety    hx of   Arthritis    bilateral hands (R>L)   Coughing up blood 08/13/2012   piece of tissue? will send to path poss from upper airway sinusitis  Final path was "abscess"    Depression    hx of   GERD 06/30/2010   on meds   Headache(784.0) 06/30/2010   migraines   Hepatic steatosis    HYPERLIPIDEMIA 06/30/2010   on meds   HYPERTENSION 06/30/2010   on meds   IBS 06/30/2010   Internal hemorrhoids    Renal lesion    Varicose veins    VITAMIN D DEFICIENCY 06/30/2010    MEDICATIONS: Current Outpatient Medications on File Prior to Visit  Medication Sig Dispense Refill   albuterol (VENTOLIN HFA) 108 (90 Base) MCG/ACT inhaler Inhale 2 puffs by mouth every 6 hours as needed. LAST refill pt needs appt. (Patient taking differently: Inhale 2 puffs into the lungs every 6 (six) hours as needed for wheezing or shortness of breath.) 8.5 each 11   atenolol (TENORMIN) 25 MG tablet TAKE 1 TABLET BY MOUTH EVERY DAY (Patient taking differently: Take 25 mg by mouth daily.) 90 tablet 2   atorvastatin (LIPITOR) 20 MG tablet TAKE 1 TABLET BY MOUTH EVERY DAY 90 tablet 1   benzonatate (TESSALON) 200 MG capsule Take 200 mg by mouth 3 (three) times daily as needed for cough. (Patient not taking: Reported on 02/08/2023)     CLARITIN 10 MG tablet Take 10 mg by mouth daily as needed for allergies or rhinitis. (Patient not taking: Reported on 02/08/2023)     fluocinonide-emollient (LIDEX-E) 0.05 % cream Apply 1 Application topically 2 (two) times daily. For itchy bug bites not on face (Patient not taking: Reported on 01/27/2023) 15 g 1   furosemide (LASIX) 20 MG tablet Take 1 tablet (20 mg total) by mouth daily. 21 tablet 0   hyoscyamine (LEVSIN SL) 0.125 MG SL tablet DISSOLVE 1 TABLET UNDER THE TONGUE EVERY MORNING AS NEEDED (Patient taking differently: Place 0.125 mg under the tongue See admin instructions. Dissolve 0.125 mg (1 tablet) under the tongue every morning as needed for  cramping) 90 tablet 1   ibuprofen (ADVIL) 200 MG tablet Take 400 mg by mouth every 6 (six) hours as needed for headache. (Patient not taking: Reported on 02/08/2023)     ipratropium-albuterol (DUONEB) 0.5-2.5 (3) MG/3ML SOLN Take 3 mLs by nebulization every 6 (six) hours as needed (Shortness of breath/wheezing). 360 mL 0   omeprazole (PRILOSEC) 20 MG capsule TAKE 1 CAPSULE (20 MG TOTAL) BY MOUTH 2 (TWO) TIMES DAILY BEFORE A MEAL. 180 capsule 0   PATADAY 0.1 % ophthalmic solution Place 1 drop into both eyes 2 (two) times daily as needed for allergies.     rizatriptan (MAXALT) 10 MG tablet Take 1 tablet (10 mg total) by mouth as needed for migraine (May repeat after 2 hours.  Maximum 2 tablets in 24 hours.). May repeat in 2 hours if needed (Patient taking differently: Take 10 mg by mouth as needed for migraine (May repeat after 2 hours.  Maximum 2 tablets in 24 hours.).) 11 tablet 5   tiZANidine (ZANAFLEX) 4 MG tablet TAKE ONE-HALF TO ONE TABLET BY MOUTH  AT BEDTIME AS NEEDED FOR NECK PAIN. (Patient taking differently: Take 4 mg by mouth at bedtime.) 90 tablet 1   No current facility-administered medications on file prior to visit.    ALLERGIES: No Known Allergies  FAMILY HISTORY: Family History  Problem Relation Age of Onset   Colon cancer Mother        age 51   Colon polyps Mother    Breast cancer Cousin    Arthritis Brother    Other Brother    Colon polyps Brother    Esophageal cancer Neg Hx    Stomach cancer Neg Hx    Rectal cancer Neg Hx       Objective:  Blood pressure 108/69, pulse 76, height 5\' 5"  (1.651 m), weight 174 lb (78.9 kg), SpO2 98%. General: No acute distress.  Patient appears well-groomed.   Head:  Normocephalic/atraumatic Eyes:  Fundi examined but not visualized Neck: supple, no paraspinal tenderness, full range of motion Heart:  Regular rate and rhythm Lungs:  Clear to auscultation bilaterally Back: No paraspinal tenderness Neurological Exam: alert and oriented.   Speech fluent and not dysarthric, language intact.  CN II-XII intact. Bulk and tone normal, muscle strength 5/5 throughout.  Sensation to light touch intact.  Deep tendon reflexes 2+ throughout, toes downgoing.  Finger to nose testing intact.  Gait normal, Romberg negative.   Shon Millet, DO  CC: Berniece Andreas, MD

## 2023-03-28 ENCOUNTER — Ambulatory Visit: Payer: Medicare Other | Admitting: Neurology

## 2023-03-28 ENCOUNTER — Encounter: Payer: Self-pay | Admitting: Neurology

## 2023-03-28 VITALS — BP 108/69 | HR 76 | Ht 65.0 in | Wt 174.0 lb

## 2023-03-28 DIAGNOSIS — G43009 Migraine without aura, not intractable, without status migrainosus: Secondary | ICD-10-CM | POA: Diagnosis not present

## 2023-03-28 DIAGNOSIS — M9904 Segmental and somatic dysfunction of sacral region: Secondary | ICD-10-CM | POA: Diagnosis not present

## 2023-03-28 DIAGNOSIS — M9903 Segmental and somatic dysfunction of lumbar region: Secondary | ICD-10-CM | POA: Diagnosis not present

## 2023-03-28 DIAGNOSIS — M47812 Spondylosis without myelopathy or radiculopathy, cervical region: Secondary | ICD-10-CM

## 2023-03-28 DIAGNOSIS — M9901 Segmental and somatic dysfunction of cervical region: Secondary | ICD-10-CM | POA: Diagnosis not present

## 2023-03-28 DIAGNOSIS — M9905 Segmental and somatic dysfunction of pelvic region: Secondary | ICD-10-CM | POA: Diagnosis not present

## 2023-03-28 NOTE — Patient Instructions (Addendum)
Take Ubrelvy at earliest onset of migraine.  May repeat after 2 hours.  Maximum 2 tablets in 24 hours. Follow up one year    [facility] [employer] [address]

## 2023-03-31 DIAGNOSIS — J209 Acute bronchitis, unspecified: Secondary | ICD-10-CM | POA: Diagnosis not present

## 2023-04-01 ENCOUNTER — Ambulatory Visit
Admission: RE | Admit: 2023-04-01 | Discharge: 2023-04-01 | Disposition: A | Discharge status: Home / Self Care | Payer: Medicare Other | Source: Ambulatory Visit | Attending: Internal Medicine | Admitting: Internal Medicine

## 2023-04-01 DIAGNOSIS — J9801 Acute bronchospasm: Secondary | ICD-10-CM

## 2023-04-01 DIAGNOSIS — R053 Chronic cough: Secondary | ICD-10-CM

## 2023-04-01 DIAGNOSIS — R918 Other nonspecific abnormal finding of lung field: Secondary | ICD-10-CM | POA: Diagnosis not present

## 2023-04-04 ENCOUNTER — Telehealth: Payer: Self-pay | Admitting: Internal Medicine

## 2023-04-04 NOTE — Telephone Encounter (Signed)
Checking on results of CT scan, requesting a call to discuss when they are available

## 2023-04-11 ENCOUNTER — Encounter: Payer: Self-pay | Admitting: Internal Medicine

## 2023-04-11 DIAGNOSIS — M9901 Segmental and somatic dysfunction of cervical region: Secondary | ICD-10-CM | POA: Diagnosis not present

## 2023-04-11 DIAGNOSIS — M9905 Segmental and somatic dysfunction of pelvic region: Secondary | ICD-10-CM | POA: Diagnosis not present

## 2023-04-11 DIAGNOSIS — M9904 Segmental and somatic dysfunction of sacral region: Secondary | ICD-10-CM | POA: Diagnosis not present

## 2023-04-11 DIAGNOSIS — M9903 Segmental and somatic dysfunction of lumbar region: Secondary | ICD-10-CM | POA: Diagnosis not present

## 2023-04-11 NOTE — Progress Notes (Signed)
Good news ... the chest ct shows no residual pneumonia or significant important lung findings .that would be causing the coughing   The few small pulmonary  nodules are a common findings and no need to do FU imaging at this time.

## 2023-04-11 NOTE — Telephone Encounter (Signed)
Pt is calling and would like CT scan result pt is aware md not in office today

## 2023-04-11 NOTE — Telephone Encounter (Signed)
Error/njr °

## 2023-04-11 NOTE — Telephone Encounter (Signed)
Spoke to pt and went over CT result. Please see lab note.

## 2023-04-12 NOTE — Progress Notes (Signed)
Sent mychart message to pt. 

## 2023-04-13 ENCOUNTER — Encounter: Payer: Self-pay | Admitting: Pulmonary Disease

## 2023-04-13 ENCOUNTER — Ambulatory Visit: Payer: Medicare Other | Admitting: Pulmonary Disease

## 2023-04-13 VITALS — BP 118/82 | HR 72 | Temp 97.6°F | Ht 65.0 in | Wt 172.8 lb

## 2023-04-13 DIAGNOSIS — R053 Chronic cough: Secondary | ICD-10-CM | POA: Diagnosis not present

## 2023-04-13 MED ORDER — FLUTICASONE PROPIONATE 50 MCG/ACT NA SUSP: 1.0000 | Freq: Every day | NASAL | 2 refills | Status: AC

## 2023-04-13 NOTE — Progress Notes (Signed)
@Patient  ID: Michele Monroe, female    DOB: Jul 19, 1946, 77 y.o.   MRN: [id number]  Chief Complaint  Patient presents with   Consult    Pt has a little cough    Referring provider: [name], MD  HPI:   77 y.o. woman whom we are seeing for evaluation of chronic cough.  Multiple PCP notes reviewed.  Discharge summary 01/2023 reviewed.  Patient developed cough a few months ago.  Baby around April.  She associates this with pollen season, change of seasons.  Productive of minimal phlegm but usually productive.  About a month later scheduled follow-up with PCP about cough.  That same day her cough became more violent more significant.  Loss of voice.  Difficulty breathing.  The sent to the ED from doctor's office.  She was treated with antibiotics and steroids given some concern for wheezing.  She simply had a rhinovirus infection on her respiratory viral panel.  Supportive care was given.  Discharge from the hospital.  Gradually her time her cough has improved.  Cough seems little bit different now.  She endorses some rhinorrhea and postnasal drip.  His light cough is worsened a bit at the warmer weather as of late where prior it was doing a bit better.  Reviewed her recent chest imaging CT scan 03/2019 for the lung nodule appropriate reveals likely scarring throughout the bases, very mild bronchiectasis right lower lobe, radiology read comments on scattered small less than 5 mm solid pulmonary nodules.  Otherwise clear lungs.  Questionaires / Pulmonary Flowsheets:   ACT:      No data to display          MMRC:     No data to display          Epworth:      No data to display          Tests:   FENO:  No results found for: "NITRICOXIDE"  PFT:     No data to display          WALK:      No data to display          Imaging: Personally reviewed and as per EMR and discussion in this note CT CHEST WO CONTRAST  Result Date: 04/09/2023 CLINICAL DATA:   Persistent cough for 3 weeks.  Acute bronchospasm. EXAM: CT CHEST WITHOUT CONTRAST TECHNIQUE: Multidetector CT imaging of the chest was performed following the standard protocol without IV contrast. RADIATION DOSE REDUCTION: This exam was performed according to the departmental dose-optimization program which includes automated exposure control, adjustment of the mA and/or kV according to patient size and/or use of iterative reconstruction technique. COMPARISON:  X-ray 02/28/2019 and older FINDINGS: Cardiovascular: On this non IV contrast exam, the heart is nonenlarged. No pericardial effusion. Coronary artery calcifications are seen. There also calcifications in the area of the aortic valve and along the normal caliber thoracic aorta. Mediastinum/Nodes: Slightly patulous thoracic esophagus. Small thyroid gland. No specific abnormal lymph node enlargement identified in the axillary regions, hilum or mediastinum. Lungs/Pleura: No consolidation, pneumothorax or effusion. There is some reticular changes along the lung bases, interstitial septal thickening. A few tiny lung nodules are seen. Example medial right lower lobe measures 3 mm on series 5, image 106. 2 mm nodule right lower lobe on series 5, image 83. 2 mm nodule left upper lobe on series 5, image 70. Few other tiny areas as well. Upper Abdomen: The adrenal glands are preserved in  the upper abdomen. Gallbladder is mildly distended. There is a small cystic area along the posterior aspect of the spleen measuring 16 mm in diameter with Hounsfield of 5. Simple cysts. This has described previously CT scan of 10/13/2021 at 17 mm, not significantly changed. No additional follow-up. Musculoskeletal: Scattered degenerative changes along the spine. Multilevel Schmorl's node deformities. IMPRESSION: Few areas of lung base reticular changes, interstitial septal thickening. There is also a few tiny lung nodules under 5 mm. No follow-up needed if patient is low-risk (and has  no known or suspected primary neoplasm). Non-contrast chest CT can be considered in 12 months if patient is high-risk. This recommendation follows the consensus statement: Guidelines for Management of Incidental Pulmonary Nodules Detected on CT Images: From the Fleischner Society 2017; Radiology 2017; 284:228-243. No consolidation, pneumothorax or effusion. Aortic Atherosclerosis (ICD10-I70.0). Electronically Signed   By: Karen Kays M.D.   On: 04/09/2023 19:09    Lab Results: Personally reviewed CBC    Component Value Date/Time   WBC 10.5 01/28/2023 0410   RBC 4.46 01/28/2023 0410   HGB 12.8 01/28/2023 0410   HCT 38.5 01/28/2023 0410   PLT 209 01/28/2023 0410   MCV 86.3 01/28/2023 0410   MCH 28.7 01/28/2023 0410   MCHC 33.2 01/28/2023 0410   RDW 12.4 01/28/2023 0410   LYMPHSABS 2.5 01/27/2023 1219   MONOABS 1.3 (H) 01/27/2023 1219   EOSABS 0.7 (H) 01/27/2023 1219   BASOSABS 0.1 01/27/2023 1219    BMET    Component Value Date/Time   NA 140 01/28/2023 0410   K 4.1 01/28/2023 0410   CL 105 01/28/2023 0410   CO2 26 01/28/2023 0410   GLUCOSE 103 (H) 01/28/2023 0410   BUN 19 01/28/2023 0410   CREATININE 0.74 01/28/2023 0410   CALCIUM 9.0 01/28/2023 0410   GFRNONAA >60 01/28/2023 0410    BNP    Component Value Date/Time   BNP 38.3 01/27/2023 1219    ProBNP No results found for: "PROBNP"  Specialty Problems       Pulmonary Problems   ALLERGIC RHINITIS    Qualifier: Diagnosis of  By: Fabian Sharp MD, Neta Mends  Replacing diagnoses that were inactivated after the 12/06/22 regulatory import      Coughing up blood    piece of tissue? will send to path poss from upper airway sinusitis  Final path was "abscess"      Recurrent acute sinusitis    becausse of congestion and blood issue antibiotic today empiric.       Allergic rhinitis ?    no complication noted today chest is clear hs of recurrent sinus infections      Bronchitis    No Known Allergies  Immunization  History  Administered Date(s) Administered   Fluad Quad(high Dose 65+) 05/02/2019   Influenza Split 06/05/2012, 05/15/2013   Influenza, High Dose Seasonal PF 04/20/2016, 06/23/2017, 06/19/2018   Influenza,inj,Quad PF,6+ Mos 06/03/2015   Influenza-Unspecified 06/06/2014   Pneumococcal Conjugate-13 08/07/2012   Pneumococcal Polysaccharide-23 09/24/2012   Td 09/07/2007   Zoster, Live 09/24/2012    Past Medical History:  Diagnosis Date   Adenomatous colon polyp    ALLERGIC RHINITIS 06/30/2010   perennial   Allergy    seasonal allergies   Anxiety    hx of   Arthritis    bilateral hands (R>L)   Coughing up blood 08/13/2012   piece of tissue? will send to path poss from upper airway sinusitis  Final path was "abscess"    Depression  hx of   GERD 06/30/2010   on meds   Headache(784.0) 06/30/2010   migraines   Hepatic steatosis    HYPERLIPIDEMIA 06/30/2010   on meds   HYPERTENSION 06/30/2010   on meds   IBS 06/30/2010   Internal hemorrhoids    Renal lesion    Varicose veins    VITAMIN D DEFICIENCY 06/30/2010    Tobacco History: Social History   Tobacco Use  Smoking Status Former   Current packs/day: 0.00   Types: Cigarettes   Quit date: 09/06/1976   Years since quitting: 46.6  Smokeless Tobacco Never   Counseling given: Not Answered   Continue to not smoke  Outpatient Encounter Medications as of 04/13/2023  Medication Sig   albuterol (VENTOLIN HFA) 108 (90 Base) MCG/ACT inhaler Inhale 2 puffs by mouth every 6 hours as needed. LAST refill pt needs appt. (Patient taking differently: Inhale 2 puffs into the lungs every 6 (six) hours as needed for wheezing or shortness of breath.)   atenolol (TENORMIN) 25 MG tablet TAKE 1 TABLET BY MOUTH EVERY DAY (Patient taking differently: Take 25 mg by mouth daily.)   atorvastatin (LIPITOR) 20 MG tablet TAKE 1 TABLET BY MOUTH EVERY DAY   cetirizine (ZYRTEC) 10 MG chewable tablet Chew 10 mg by mouth daily.   fluticasone (FLONASE)  50 MCG/ACT nasal spray Place 1 spray into both nostrils daily.   furosemide (LASIX) 20 MG tablet Take 1 tablet (20 mg total) by mouth daily.   hyoscyamine (LEVSIN SL) 0.125 MG SL tablet DISSOLVE 1 TABLET UNDER THE TONGUE EVERY MORNING AS NEEDED (Patient taking differently: Place 0.125 mg under the tongue See admin instructions. Dissolve 0.125 mg (1 tablet) under the tongue every morning as needed for cramping)   ibuprofen (ADVIL) 200 MG tablet Take 400 mg by mouth every 6 (six) hours as needed for headache.   ipratropium-albuterol (DUONEB) 0.5-2.5 (3) MG/3ML SOLN Take 3 mLs by nebulization every 6 (six) hours as needed (Shortness of breath/wheezing).   omeprazole (PRILOSEC) 20 MG capsule TAKE 1 CAPSULE (20 MG TOTAL) BY MOUTH 2 (TWO) TIMES DAILY BEFORE A MEAL.   PATADAY 0.1 % ophthalmic solution Place 1 drop into both eyes 2 (two) times daily as needed for allergies.   rizatriptan (MAXALT) 10 MG tablet Take 1 tablet (10 mg total) by mouth as needed for migraine (May repeat after 2 hours.  Maximum 2 tablets in 24 hours.). May repeat in 2 hours if needed (Patient taking differently: Take 10 mg by mouth as needed for migraine (May repeat after 2 hours.  Maximum 2 tablets in 24 hours.).)   tiZANidine (ZANAFLEX) 4 MG tablet TAKE ONE-HALF TO ONE TABLET BY MOUTH AT BEDTIME AS NEEDED FOR NECK PAIN. (Patient taking differently: Take 4 mg by mouth at bedtime.)   No facility-administered encounter medications on file as of 04/13/2023.     Review of Systems  Review of Systems  No chest pain exertion.  No orthopnea or PND.  Comprehensive review of systems otherwise negative. Physical Exam  BP 118/82 (BP Location: Left Arm, Cuff Size: Normal)   Pulse 72   Temp 97.6 F (36.4 C) (Oral)   Ht 5\' 5"  (1.651 m)   Wt 172 lb 12.8 oz (78.4 kg)   SpO2 96%   BMI 28.76 kg/m   Wt Readings from Last 5 Encounters:  04/13/23 172 lb 12.8 oz (78.4 kg)  03/28/23 174 lb (78.9 kg)  01/27/23 168 lb (76.2 kg)  01/27/23 168  lb 9.6 oz (76.5  kg)  01/14/23 172 lb (78 kg)    BMI Readings from Last 5 Encounters:  04/13/23 28.76 kg/m  03/28/23 28.96 kg/m  01/27/23 27.96 kg/m  01/27/23 28.06 kg/m  01/14/23 28.62 kg/m     Physical Exam General: Sitting in chair, no acute distress Eyes: EOMI, no icterus Neck: Supple, no JVP Pulmonary: Clear, no work of breathing Cardiovascular: Warm, no edema Abdomen: Nondistended Bowel Sounds Present MSK: No Synovitis, No Joint Effusion Neuro: Normal gait, no weakness Psych: Normal Monroe, full affect  Assessment & Plan:   Chronic cough: Present for months.  Preceded rhinovirus diagnosis and hospitalization although certainly worsened over this illness acutely.  Gradually improved in the interim.  Associated with seasonal changes.  High suspicion for allergy driven cough specifically postnasal drip with sinus congestion, rhinorrhea.  Postviral cough syndrome and development of asthma also possible.  Given symptoms more specifically point to postnasal drip, trial Flonase twice a day for 1 week then once daily, Afrin twice a day for 3 days then stop, resume cetirizine, antihistamine.  Lung nodules: Multiple less than 6 mm.  Low risk.  No further images needed per Fleischner criteria.   Return in about 3 months (around 07/14/2023).   Karren Burly, MD 04/13/2023   This appointment required 60 minutes of patient care (this includes precharting, chart review, review of results, face-to-face care, etc.).

## 2023-04-13 NOTE — Patient Instructions (Addendum)
Nice to meet you  Resume zyrtec (cetirizine) 1 tablet at night  Use flonase 1 spray each nostril twice a day for 1 week and then if improving decrease to once a day  Get Afrin over the counter - use 2 spray each nostril twice a day for 3 days then STOP  Return to clinic in 3 months or sooner as needed

## 2023-04-18 DIAGNOSIS — M9905 Segmental and somatic dysfunction of pelvic region: Secondary | ICD-10-CM | POA: Diagnosis not present

## 2023-04-18 DIAGNOSIS — M9904 Segmental and somatic dysfunction of sacral region: Secondary | ICD-10-CM | POA: Diagnosis not present

## 2023-04-18 DIAGNOSIS — M9903 Segmental and somatic dysfunction of lumbar region: Secondary | ICD-10-CM | POA: Diagnosis not present

## 2023-04-18 DIAGNOSIS — M9901 Segmental and somatic dysfunction of cervical region: Secondary | ICD-10-CM | POA: Diagnosis not present

## 2023-04-27 DIAGNOSIS — M9904 Segmental and somatic dysfunction of sacral region: Secondary | ICD-10-CM | POA: Diagnosis not present

## 2023-04-27 DIAGNOSIS — M9905 Segmental and somatic dysfunction of pelvic region: Secondary | ICD-10-CM | POA: Diagnosis not present

## 2023-04-27 DIAGNOSIS — M9901 Segmental and somatic dysfunction of cervical region: Secondary | ICD-10-CM | POA: Diagnosis not present

## 2023-04-27 DIAGNOSIS — M9903 Segmental and somatic dysfunction of lumbar region: Secondary | ICD-10-CM | POA: Diagnosis not present

## 2023-05-01 DIAGNOSIS — J209 Acute bronchitis, unspecified: Secondary | ICD-10-CM | POA: Diagnosis not present

## 2023-05-04 DIAGNOSIS — M9904 Segmental and somatic dysfunction of sacral region: Secondary | ICD-10-CM | POA: Diagnosis not present

## 2023-05-04 DIAGNOSIS — M9903 Segmental and somatic dysfunction of lumbar region: Secondary | ICD-10-CM | POA: Diagnosis not present

## 2023-05-04 DIAGNOSIS — M9905 Segmental and somatic dysfunction of pelvic region: Secondary | ICD-10-CM | POA: Diagnosis not present

## 2023-05-04 DIAGNOSIS — M9901 Segmental and somatic dysfunction of cervical region: Secondary | ICD-10-CM | POA: Diagnosis not present

## 2023-05-06 ENCOUNTER — Telehealth: Payer: Self-pay | Admitting: Pulmonary Disease

## 2023-05-06 ENCOUNTER — Encounter (HOSPITAL_BASED_OUTPATIENT_CLINIC_OR_DEPARTMENT_OTHER): Payer: Self-pay

## 2023-05-06 NOTE — Telephone Encounter (Signed)
Left message on machine as well.

## 2023-05-06 NOTE — Telephone Encounter (Signed)
Pt was given a sample of Ubrelvy for head aches by Dr. On last visit. If she liked it she was told to call for an RX. She would like it to be called [facility] to [facility] [address].  Questions call her @ [phone]

## 2023-05-11 ENCOUNTER — Telehealth: Payer: Self-pay | Admitting: Neurology

## 2023-05-11 DIAGNOSIS — M9905 Segmental and somatic dysfunction of pelvic region: Secondary | ICD-10-CM | POA: Diagnosis not present

## 2023-05-11 DIAGNOSIS — M9904 Segmental and somatic dysfunction of sacral region: Secondary | ICD-10-CM | POA: Diagnosis not present

## 2023-05-11 DIAGNOSIS — M9901 Segmental and somatic dysfunction of cervical region: Secondary | ICD-10-CM | POA: Diagnosis not present

## 2023-05-11 DIAGNOSIS — M9903 Segmental and somatic dysfunction of lumbar region: Secondary | ICD-10-CM | POA: Diagnosis not present

## 2023-05-11 MED ORDER — UBRELVY 100 MG PO TABS: 100.0000 mg | ORAL_TABLET | ORAL | 5 refills | Status: DC | PRN

## 2023-05-11 NOTE — Telephone Encounter (Signed)
Pt is calling in stating that she would like to have the refill on Ubrelvy since she has completed the samples.  And would like to have it called in today if possible.   Pharm:  [facility] [address].

## 2023-05-16 DIAGNOSIS — M9905 Segmental and somatic dysfunction of pelvic region: Secondary | ICD-10-CM | POA: Diagnosis not present

## 2023-05-16 DIAGNOSIS — M9903 Segmental and somatic dysfunction of lumbar region: Secondary | ICD-10-CM | POA: Diagnosis not present

## 2023-05-16 DIAGNOSIS — M9901 Segmental and somatic dysfunction of cervical region: Secondary | ICD-10-CM | POA: Diagnosis not present

## 2023-05-16 DIAGNOSIS — M9904 Segmental and somatic dysfunction of sacral region: Secondary | ICD-10-CM | POA: Diagnosis not present

## 2023-05-19 DIAGNOSIS — M9904 Segmental and somatic dysfunction of sacral region: Secondary | ICD-10-CM | POA: Diagnosis not present

## 2023-05-19 DIAGNOSIS — M9905 Segmental and somatic dysfunction of pelvic region: Secondary | ICD-10-CM | POA: Diagnosis not present

## 2023-05-19 DIAGNOSIS — M9903 Segmental and somatic dysfunction of lumbar region: Secondary | ICD-10-CM | POA: Diagnosis not present

## 2023-05-19 DIAGNOSIS — M9901 Segmental and somatic dysfunction of cervical region: Secondary | ICD-10-CM | POA: Diagnosis not present

## 2023-05-25 ENCOUNTER — Telehealth: Payer: Self-pay | Admitting: Neurology

## 2023-05-25 DIAGNOSIS — M9903 Segmental and somatic dysfunction of lumbar region: Secondary | ICD-10-CM | POA: Diagnosis not present

## 2023-05-25 DIAGNOSIS — M9904 Segmental and somatic dysfunction of sacral region: Secondary | ICD-10-CM | POA: Diagnosis not present

## 2023-05-25 DIAGNOSIS — M9901 Segmental and somatic dysfunction of cervical region: Secondary | ICD-10-CM | POA: Diagnosis not present

## 2023-05-25 DIAGNOSIS — M9905 Segmental and somatic dysfunction of pelvic region: Secondary | ICD-10-CM | POA: Diagnosis not present

## 2023-05-25 NOTE — Telephone Encounter (Signed)
Per [employer] PA needed for [place]   Patient advise of PA needed, and Samples needed.

## 2023-05-25 NOTE — Telephone Encounter (Signed)
Patient called wanting to know if she could get Maxalt samples . She is completely out of the samples

## 2023-05-25 NOTE — Telephone Encounter (Signed)
Per  patient [facility] wanted to know if she could get samples.

## 2023-05-26 NOTE — Telephone Encounter (Signed)
Medication Samples have been provided to the patient.  Drug name: Bernita Raisin       Strength: 100 mg        Qty: 3  LOT: [id number]  Exp.Date: 09/2024  Dosing instructions: as needed  The patient has been instructed regarding the correct time, dose, and frequency of taking this medication, including desired effects and most common side effects.   Leida Lauth 11:21 AM 05/26/2023

## 2023-06-01 DIAGNOSIS — M9904 Segmental and somatic dysfunction of sacral region: Secondary | ICD-10-CM | POA: Diagnosis not present

## 2023-06-01 DIAGNOSIS — M9901 Segmental and somatic dysfunction of cervical region: Secondary | ICD-10-CM | POA: Diagnosis not present

## 2023-06-01 DIAGNOSIS — M9905 Segmental and somatic dysfunction of pelvic region: Secondary | ICD-10-CM | POA: Diagnosis not present

## 2023-06-01 DIAGNOSIS — M9903 Segmental and somatic dysfunction of lumbar region: Secondary | ICD-10-CM | POA: Diagnosis not present

## 2023-06-06 ENCOUNTER — Other Ambulatory Visit (HOSPITAL_COMMUNITY): Payer: Self-pay

## 2023-06-06 ENCOUNTER — Telehealth: Payer: Self-pay

## 2023-06-06 NOTE — Telephone Encounter (Signed)
Pharmacy Patient Advocate Encounter   Received notification from Pt Calls Messages that prior authorization for Ubrelvy 100MG  tablets is required/requested.   Insurance verification completed.   The patient is insured through [facility] .   Per test claim: PA required; PA submitted to [facility] via CoverMyMeds Key/confirmation #/EOC [id number] Status is pending

## 2023-06-08 DIAGNOSIS — M9905 Segmental and somatic dysfunction of pelvic region: Secondary | ICD-10-CM | POA: Diagnosis not present

## 2023-06-08 DIAGNOSIS — M9901 Segmental and somatic dysfunction of cervical region: Secondary | ICD-10-CM | POA: Diagnosis not present

## 2023-06-08 DIAGNOSIS — M9904 Segmental and somatic dysfunction of sacral region: Secondary | ICD-10-CM | POA: Diagnosis not present

## 2023-06-08 DIAGNOSIS — M9903 Segmental and somatic dysfunction of lumbar region: Secondary | ICD-10-CM | POA: Diagnosis not present

## 2023-06-10 ENCOUNTER — Other Ambulatory Visit: Payer: Self-pay | Admitting: Internal Medicine

## 2023-06-11 ENCOUNTER — Other Ambulatory Visit: Payer: Self-pay | Admitting: Internal Medicine

## 2023-06-11 ENCOUNTER — Other Ambulatory Visit: Payer: Self-pay | Admitting: Gastroenterology

## 2023-06-14 DIAGNOSIS — M9901 Segmental and somatic dysfunction of cervical region: Secondary | ICD-10-CM | POA: Diagnosis not present

## 2023-06-14 DIAGNOSIS — M9904 Segmental and somatic dysfunction of sacral region: Secondary | ICD-10-CM | POA: Diagnosis not present

## 2023-06-14 DIAGNOSIS — M9905 Segmental and somatic dysfunction of pelvic region: Secondary | ICD-10-CM | POA: Diagnosis not present

## 2023-06-14 DIAGNOSIS — M9903 Segmental and somatic dysfunction of lumbar region: Secondary | ICD-10-CM | POA: Diagnosis not present

## 2023-06-15 NOTE — Telephone Encounter (Signed)
LMOVM approval.

## 2023-06-15 NOTE — Telephone Encounter (Signed)
Pharmacy Patient Advocate Encounter  Received notification from [facility] that Prior Authorization for Ubrelvy 100MG  tablets has been APPROVED from 06-06-2023 to 09-06-2023   PA #/Case ID/Reference #: [id number]

## 2023-06-16 ENCOUNTER — Other Ambulatory Visit: Payer: Self-pay | Admitting: Internal Medicine

## 2023-06-16 DIAGNOSIS — Z1231 Encounter for screening mammogram for malignant neoplasm of breast: Secondary | ICD-10-CM

## 2023-06-17 ENCOUNTER — Other Ambulatory Visit: Payer: Self-pay | Admitting: Internal Medicine

## 2023-06-22 ENCOUNTER — Telehealth: Payer: Self-pay | Admitting: Neurology

## 2023-06-22 DIAGNOSIS — M9905 Segmental and somatic dysfunction of pelvic region: Secondary | ICD-10-CM | POA: Diagnosis not present

## 2023-06-22 DIAGNOSIS — M9903 Segmental and somatic dysfunction of lumbar region: Secondary | ICD-10-CM | POA: Diagnosis not present

## 2023-06-22 DIAGNOSIS — M9904 Segmental and somatic dysfunction of sacral region: Secondary | ICD-10-CM | POA: Diagnosis not present

## 2023-06-22 DIAGNOSIS — M9901 Segmental and somatic dysfunction of cervical region: Secondary | ICD-10-CM | POA: Diagnosis not present

## 2023-06-22 NOTE — Telephone Encounter (Signed)
Called pt and told her that the insurance would have to be looked at again. That the [employer] had a web site that could help with cost. And she told me she already had 2 sample with [place] and I would ask Dr. Everlena Cooper for more, but we are unable to give another sample at this time.

## 2023-06-22 NOTE — Telephone Encounter (Signed)
[facility] called stating that [facility] told her that she needed to pay 300$ for the ubrevly 100mg . That her insurance didn't approve, even though there is a message/ note stating that it was approved. I told patient to make [facility] card [facility]

## 2023-07-06 ENCOUNTER — Telehealth (INDEPENDENT_AMBULATORY_CARE_PROVIDER_SITE_OTHER): Payer: Medicare Other | Admitting: Internal Medicine

## 2023-07-06 ENCOUNTER — Encounter: Payer: Self-pay | Admitting: Internal Medicine

## 2023-07-06 ENCOUNTER — Telehealth: Payer: Self-pay | Admitting: Internal Medicine

## 2023-07-06 DIAGNOSIS — Z79899 Other long term (current) drug therapy: Secondary | ICD-10-CM

## 2023-07-06 DIAGNOSIS — R609 Edema, unspecified: Secondary | ICD-10-CM | POA: Diagnosis not present

## 2023-07-06 DIAGNOSIS — Z8701 Personal history of pneumonia (recurrent): Secondary | ICD-10-CM

## 2023-07-06 DIAGNOSIS — R053 Chronic cough: Secondary | ICD-10-CM | POA: Diagnosis not present

## 2023-07-06 MED ORDER — FUROSEMIDE 20 MG PO TABS
20.0000 mg | ORAL_TABLET | Freq: Every day | ORAL | 0 refills | Status: DC
Start: 2023-07-06 — End: 2023-07-29

## 2023-07-06 MED ORDER — DOXYCYCLINE HYCLATE 100 MG PO TABS: 100.0000 mg | ORAL_TABLET | Freq: Two times a day (BID) | ORAL | 0 refills | Status: DC

## 2023-07-06 NOTE — Progress Notes (Signed)
Virtual Visit via Video Note  I connected with Michele Monroe on 07/06/23 at  2:30 PM EDT by a video enabled telemedicine application and verified that I am speaking with the correct person using two identifiers. Location patient: home Location provider:work office Persons participating in the virtual visit: patient, provider  Patient aware  of the limitations of evaluation and management by telemedicine and  availability of in person appointments. and agreed to proceed.   HPI: Michele Monroe presents for video visit 3-4 weeks o fincreasing cough  on going  had improved after rx for pna  but still had somewhat of a cough post infectious  now  feeling relapsing again  No fever  but tired   Cough for weeks and not getting better   yellow phlegm from previous clear   no bleed . No sinusitus   Also ran out of lasix thjat helped her swelling  and some swelling again asks for refill  . ROS: See pertinent positives and negatives per HPI. No dyspnea or fever  Has fu pulm in Novemeber   Past Medical History:  Diagnosis Date   Adenomatous colon polyp    ALLERGIC RHINITIS 06/30/2010   perennial   Allergy    seasonal allergies   Anxiety    hx of   Arthritis    bilateral hands (R>L)   Coughing up blood 08/13/2012   piece of tissue? will send to path poss from upper airway sinusitis  Final path was "abscess"    Depression    hx of   GERD 06/30/2010   on meds   Headache(784.0) 06/30/2010   migraines   Hepatic steatosis    HYPERLIPIDEMIA 06/30/2010   on meds   HYPERTENSION 06/30/2010   on meds   IBS 06/30/2010   Internal hemorrhoids    Renal lesion    Varicose veins    VITAMIN D DEFICIENCY 06/30/2010    Past Surgical History:  Procedure Laterality Date   BREAST CYST ASPIRATION     BREAST SURGERY     needle bx left breast   COLONOSCOPY  2016   KN-prep good-TA   feet     hammer toe sx   POLYPECTOMY  2016   TA   ROTATOR CUFF REPAIR Right    varicose veins      Family  History  Problem Relation Age of Onset   Colon cancer Mother        age 77   Colon polyps Mother    Breast cancer Cousin    Arthritis Brother    Other Brother    Colon polyps Brother    Esophageal cancer Neg Hx    Stomach cancer Neg Hx    Rectal cancer Neg Hx     Social History   Tobacco Use   Smoking status: Former    Current packs/day: 0.00    Types: Cigarettes    Quit date: 09/06/1976    Years since quitting: 46.8   Smokeless tobacco: Never  Vaping Use   Vaping status: Never Used  Substance Use Topics   Alcohol use: No    Alcohol/week: 0.0 standard drinks of alcohol    Comment: rarely   Drug use: No      Current Outpatient Medications:    albuterol (VENTOLIN HFA) 108 (90 Base) MCG/ACT inhaler, Inhale 2 puffs by mouth every 6 hours as needed. LAST refill pt needs appt. (Patient taking differently: Inhale 2 puffs into the lungs every 6 (six) hours as needed  for wheezing or shortness of breath.), Disp: 8.5 each, Rfl: 11   atenolol (TENORMIN) 25 MG tablet, TAKE 1 TABLET BY MOUTH EVERY DAY, Disp: 90 tablet, Rfl: 2   atorvastatin (LIPITOR) 20 MG tablet, TAKE 1 TABLET BY MOUTH EVERY DAY, Disp: 90 tablet, Rfl: 1   cetirizine (ZYRTEC) 10 MG chewable tablet, Chew 10 mg by mouth daily., Disp: , Rfl:    doxycycline (VIBRA-TABS) 100 MG tablet, Take 1 tablet (100 mg total) by mouth 2 (two) times daily., Disp: 14 tablet, Rfl: 0   fluticasone (FLONASE) 50 MCG/ACT nasal spray, Place 1 spray into both nostrils daily., Disp: 16 mL, Rfl: 2   hyoscyamine (LEVSIN SL) 0.125 MG SL tablet, DISSOLVE 1 TABLET UNDER THE TONGUE EVERY MORNING AS NEEDED, Disp: 90 tablet, Rfl: 1   ibuprofen (ADVIL) 200 MG tablet, Take 400 mg by mouth every 6 (six) hours as needed for headache., Disp: , Rfl:    omeprazole (PRILOSEC) 20 MG capsule, TAKE 1 CAPSULE BY MOUTH TWICE A DAY BEFORE A MEAL, Disp: 180 capsule, Rfl: 0   tiZANidine (ZANAFLEX) 4 MG tablet, TAKE 1/2 TO 1 TABLET BY MOUTH AT BEDTIME AS NEEDED FOR NECK  PAIN., Disp: 90 tablet, Rfl: 1   furosemide (LASIX) 20 MG tablet, Take 1 tablet (20 mg total) by mouth daily., Disp: 30 tablet, Rfl: 0   ipratropium-albuterol (DUONEB) 0.5-2.5 (3) MG/3ML SOLN, Take 3 mLs by nebulization every 6 (six) hours as needed (Shortness of breath/wheezing)., Disp: 360 mL, Rfl: 0   PATADAY 0.1 % ophthalmic solution, Place 1 drop into both eyes 2 (two) times daily as needed for allergies. (Patient not taking: Reported on 07/06/2023), Disp: , Rfl:    rizatriptan (MAXALT) 10 MG tablet, Take 1 tablet (10 mg total) by mouth as needed for migraine (May repeat after 2 hours.  Maximum 2 tablets in 24 hours.). May repeat in 2 hours if needed (Patient not taking: Reported on 07/06/2023), Disp: 11 tablet, Rfl: 5   Ubrogepant (UBRELVY) 100 MG TABS, Take 1 tablet (100 mg total) by mouth as needed (take 1 tab at the earlist onset of a Migraine. May repeat in 2 hours. Max 2 tab in 24 hours). (Patient not taking: Reported on 07/06/2023), Disp: 8 tablet, Rfl: 5  EXAM: BP Readings from Last 3 Encounters:  04/13/23 118/82  03/28/23 108/69  01/29/23 (!) 122/92    VITALS per patient if applicable:  GENERAL: alert, oriented, appears well and in no acute distress Congested and no resp distress cognition intact   PSYCH/NEURO: pleasant and cooperative, no obvious depression or anxiety, speech and thought processing grossly intact Lab Results  Component Value Date   WBC 10.5 01/28/2023   HGB 12.8 01/28/2023   HCT 38.5 01/28/2023   PLT 209 01/28/2023   GLUCOSE 103 (H) 01/28/2023   CHOL 162 04/08/2021   TRIG 185.0 (H) 04/08/2021   HDL 49.90 04/08/2021   LDLCALC 75 04/08/2021   ALT 13 12/21/2022   AST 21 12/21/2022   NA 140 01/28/2023   K 4.1 01/28/2023   CL 105 01/28/2023   CREATININE 0.74 01/28/2023   BUN 19 01/28/2023   CO2 26 01/28/2023   TSH 1.31 12/21/2022   HGBA1C 5.6 12/21/2022   MICROALBUR <0.7 12/21/2022    ASSESSMENT AND PLAN:  Discussed the following assessment and  plan:    ICD-10-CM   1. Persistent cough for 3 weeks or longer  R05.3     2. History of community acquired pneumonia  Z87.01  3. Swelling  R60.9 furosemide (LASIX) 20 MG tablet    4. Medication management  Z79.899      Has fu of pulm nov 11  poss intervening secondary infection based on course etc.  Empriric doxycycline  Asks for refill lasix as helped swelling  last potassium in range   Plan fu after pulmonary eval 3-4 weeks and will readdress this medication management  Counseled.   Expectant management and discussion of plan and treatment with opportunity to ask questions and all were answered. The patient agreed with the plan and demonstrated an understanding of the instructions.   Advised to call back or seek an in-person evaluation if worsening  or having  further concerns  in interim. Return for see text 3-4 weeks .    Berniece Andreas, MD

## 2023-07-06 NOTE — Telephone Encounter (Signed)
Pt has a VV at 2:30 pm. She is asking if you can please send the link to her cell phone [phone]?

## 2023-07-07 DIAGNOSIS — M9905 Segmental and somatic dysfunction of pelvic region: Secondary | ICD-10-CM | POA: Diagnosis not present

## 2023-07-07 DIAGNOSIS — M9903 Segmental and somatic dysfunction of lumbar region: Secondary | ICD-10-CM | POA: Diagnosis not present

## 2023-07-07 DIAGNOSIS — M9904 Segmental and somatic dysfunction of sacral region: Secondary | ICD-10-CM | POA: Diagnosis not present

## 2023-07-07 DIAGNOSIS — M9901 Segmental and somatic dysfunction of cervical region: Secondary | ICD-10-CM | POA: Diagnosis not present

## 2023-07-11 DIAGNOSIS — M9901 Segmental and somatic dysfunction of cervical region: Secondary | ICD-10-CM | POA: Diagnosis not present

## 2023-07-11 DIAGNOSIS — M9903 Segmental and somatic dysfunction of lumbar region: Secondary | ICD-10-CM | POA: Diagnosis not present

## 2023-07-11 DIAGNOSIS — M9905 Segmental and somatic dysfunction of pelvic region: Secondary | ICD-10-CM | POA: Diagnosis not present

## 2023-07-11 DIAGNOSIS — M9904 Segmental and somatic dysfunction of sacral region: Secondary | ICD-10-CM | POA: Diagnosis not present

## 2023-07-18 ENCOUNTER — Encounter: Payer: Self-pay | Admitting: Pulmonary Disease

## 2023-07-18 ENCOUNTER — Ambulatory Visit: Payer: Medicare Other | Admitting: Pulmonary Disease

## 2023-07-18 VITALS — BP 134/76 | HR 83 | Temp 98.3°F | Ht 65.0 in | Wt 175.0 lb

## 2023-07-18 DIAGNOSIS — R053 Chronic cough: Secondary | ICD-10-CM | POA: Diagnosis not present

## 2023-07-18 MED ORDER — FLUTICASONE-SALMETEROL 500-50 MCG/ACT IN AEPB: 1.0000 | INHALATION_SPRAY | Freq: Two times a day (BID) | RESPIRATORY_TRACT | 3 refills | Status: AC

## 2023-07-18 NOTE — Patient Instructions (Addendum)
Nice to see you again  Use Advair 1 inhalation twice a day for a day, rinse your mouth out thoroughly with water after every use  Hopefully this will help decrease inflammation and decrease sputum or phlegm production and help with cough  If the medication is too expensive please let me know and I will look for an alternative  Dr. Everlena Cooper prescribed the Bernita Raisin  Return to clinic in 3 months or sooner as needed with Dr. Judeth Horn

## 2023-07-18 NOTE — Progress Notes (Signed)
@Patient  ID: Michele Monroe, female    DOB: 1946-08-07, 77 y.o.   MRN: [id number]  Chief Complaint  Patient presents with   Follow-up    PCP rx ABT 2 weeks ago for coughing.  Sx improved.  Doing well today.  Patient could not afford Bernita Raisin    Referring provider: [name], MD  HPI:   77 y.o. woman whom we are seeing for evaluation of chronic cough.  Multiple PCP notes reviewed.    Returns for routine follow-up.  At last visit we try to target nasal therapies to see if it help with congestion and cough.  Does not seem to improve very much.  Seen by her PCP about 2 weeks ago.  Given doxycycline.  The seem to have helped.  At least with the acute worsening of cough.  However she still has chronic cough.  Productive of phlegm.  We discussed role and rationale for asthma therapies given failure of intranasal therapies.  See if this can target congestion and phlegm production and decrease cough.  HPI at initial visit Patient developed cough a few months ago.  May be started around April 2024.  She associates this with pollen season, change of seasons.  Productive of minimal phlegm but usually productive.  About a month later scheduled follow-up with PCP about cough.  That same day her cough became more violent more significant.  Loss of voice.  Difficulty breathing.  The sent to the ED from doctor's office.  She was treated with antibiotics and steroids given some concern for wheezing.  She simply had a rhinovirus infection on her respiratory viral panel.  Supportive care was given.  Discharge from the hospital.  Gradually her time her cough has improved.  Cough seems little bit different now.  She endorses some rhinorrhea and postnasal drip.  His light cough is worsened a bit at the warmer weather as of late where prior it was doing a bit better.  Reviewed her recent chest imaging CT scan 03/2019 for the lung nodule appropriate reveals likely scarring throughout the bases, very mild  bronchiectasis right lower lobe, radiology read comments on scattered small less than 5 mm solid pulmonary nodules.  Otherwise clear lungs.  Questionaires / Pulmonary Flowsheets:   ACT:      No data to display          MMRC:     No data to display          Epworth:      No data to display          Tests:   FENO:  No results found for: "NITRICOXIDE"  PFT:     No data to display          WALK:      No data to display          Imaging: Personally reviewed and as per EMR and discussion in this note No results found.  Lab Results: Personally reviewed CBC    Component Value Date/Time   WBC 10.5 01/28/2023 0410   RBC 4.46 01/28/2023 0410   HGB 12.8 01/28/2023 0410   HCT 38.5 01/28/2023 0410   PLT 209 01/28/2023 0410   MCV 86.3 01/28/2023 0410   MCH 28.7 01/28/2023 0410   MCHC 33.2 01/28/2023 0410   RDW 12.4 01/28/2023 0410   LYMPHSABS 2.5 01/27/2023 1219   MONOABS 1.3 (H) 01/27/2023 1219   EOSABS 0.7 (H) 01/27/2023 1219   BASOSABS 0.1 01/27/2023 1219  BMET    Component Value Date/Time   NA 140 01/28/2023 0410   K 4.1 01/28/2023 0410   CL 105 01/28/2023 0410   CO2 26 01/28/2023 0410   GLUCOSE 103 (H) 01/28/2023 0410   BUN 19 01/28/2023 0410   CREATININE 0.74 01/28/2023 0410   CALCIUM 9.0 01/28/2023 0410   GFRNONAA >60 01/28/2023 0410    BNP    Component Value Date/Time   BNP 38.3 01/27/2023 1219    ProBNP No results found for: "PROBNP"  Specialty Problems       Pulmonary Problems   ALLERGIC RHINITIS    Qualifier: Diagnosis of  By: Fabian Sharp MD, Neta Mends  Replacing diagnoses that were inactivated after the 12/06/22 regulatory import      Coughing up blood    piece of tissue? will send to path poss from upper airway sinusitis  Final path was "abscess"      Recurrent acute sinusitis    becausse of congestion and blood issue antibiotic today empiric.       Allergic rhinitis ?    no complication noted today chest is  clear hs of recurrent sinus infections      Bronchitis    No Known Allergies  Immunization History  Administered Date(s) Administered   Fluad Quad(high Dose 65+) 05/02/2019   Influenza Split 06/05/2012, 05/15/2013   Influenza, High Dose Seasonal PF 04/20/2016, 06/23/2017, 06/19/2018   Influenza,inj,Quad PF,6+ Mos 06/03/2015   Influenza-Unspecified 06/06/2014   Pneumococcal Conjugate-13 08/07/2012   Pneumococcal Polysaccharide-23 09/24/2012   Td 09/07/2007   Zoster, Live 09/24/2012    Past Medical History:  Diagnosis Date   Adenomatous colon polyp    ALLERGIC RHINITIS 06/30/2010   perennial   Allergy    seasonal allergies   Anxiety    hx of   Arthritis    bilateral hands (R>L)   Coughing up blood 08/13/2012   piece of tissue? will send to path poss from upper airway sinusitis  Final path was "abscess"    Depression    hx of   GERD 06/30/2010   on meds   Headache(784.0) 06/30/2010   migraines   Hepatic steatosis    HYPERLIPIDEMIA 06/30/2010   on meds   HYPERTENSION 06/30/2010   on meds   IBS 06/30/2010   Internal hemorrhoids    Renal lesion    Varicose veins    VITAMIN D DEFICIENCY 06/30/2010    Tobacco History: Social History   Tobacco Use  Smoking Status Former   Current packs/day: 0.00   Types: Cigarettes   Quit date: 09/06/1976   Years since quitting: 46.8  Smokeless Tobacco Never   Counseling given: Not Answered   Continue to not smoke  Outpatient Encounter Medications as of 07/18/2023  Medication Sig   albuterol (VENTOLIN HFA) 108 (90 Base) MCG/ACT inhaler Inhale 2 puffs by mouth every 6 hours as needed. LAST refill pt needs appt. (Patient taking differently: Inhale 2 puffs into the lungs every 6 (six) hours as needed for wheezing or shortness of breath.)   atenolol (TENORMIN) 25 MG tablet TAKE 1 TABLET BY MOUTH EVERY DAY   atorvastatin (LIPITOR) 20 MG tablet TAKE 1 TABLET BY MOUTH EVERY DAY   cetirizine (ZYRTEC) 10 MG chewable tablet Chew 10  mg by mouth daily.   fluticasone (FLONASE) 50 MCG/ACT nasal spray Place 1 spray into both nostrils daily.   fluticasone-salmeterol (ADVAIR DISKUS) 500-50 MCG/ACT AEPB Inhale 1 puff into the lungs in the morning and at bedtime.   furosemide (  LASIX) 20 MG tablet Take 1 tablet (20 mg total) by mouth daily.   hyoscyamine (LEVSIN SL) 0.125 MG SL tablet DISSOLVE 1 TABLET UNDER THE TONGUE EVERY MORNING AS NEEDED   ibuprofen (ADVIL) 200 MG tablet Take 400 mg by mouth every 6 (six) hours as needed for headache.   omeprazole (PRILOSEC) 20 MG capsule TAKE 1 CAPSULE BY MOUTH TWICE A DAY BEFORE A MEAL   rizatriptan (MAXALT) 10 MG tablet Take 1 tablet (10 mg total) by mouth as needed for migraine (May repeat after 2 hours.  Maximum 2 tablets in 24 hours.). May repeat in 2 hours if needed   tiZANidine (ZANAFLEX) 4 MG tablet TAKE 1/2 TO 1 TABLET BY MOUTH AT BEDTIME AS NEEDED FOR NECK PAIN.   Ubrogepant (UBRELVY) 100 MG TABS Take 1 tablet (100 mg total) by mouth as needed (take 1 tab at the earlist onset of a Migraine. May repeat in 2 hours. Max 2 tab in 24 hours).   doxycycline (VIBRA-TABS) 100 MG tablet Take 1 tablet (100 mg total) by mouth 2 (two) times daily. (Patient not taking: Reported on 07/18/2023)   ipratropium-albuterol (DUONEB) 0.5-2.5 (3) MG/3ML SOLN Take 3 mLs by nebulization every 6 (six) hours as needed (Shortness of breath/wheezing).   PATADAY 0.1 % ophthalmic solution Place 1 drop into both eyes 2 (two) times daily as needed for allergies. (Patient not taking: Reported on 07/18/2023)   No facility-administered encounter medications on file as of 07/18/2023.     Review of Systems  Review of Systems  N/a Physical Exam  BP 134/76 (BP Location: Left Arm, Patient Position: Sitting, Cuff Size: Large)   Pulse 83   Temp 98.3 F (36.8 C) (Oral)   Ht 5\' 5"  (1.651 m)   Wt 175 lb (79.4 kg)   SpO2 97%   BMI 29.12 kg/m   Wt Readings from Last 5 Encounters:  07/18/23 175 lb (79.4 kg)  04/13/23  172 lb 12.8 oz (78.4 kg)  03/28/23 174 lb (78.9 kg)  01/27/23 168 lb (76.2 kg)  01/27/23 168 lb 9.6 oz (76.5 kg)    BMI Readings from Last 5 Encounters:  07/18/23 29.12 kg/m  04/13/23 28.76 kg/m  03/28/23 28.96 kg/m  01/27/23 27.96 kg/m  01/27/23 28.06 kg/m     Physical Exam General: Sitting in chair, no acute distress Eyes: EOMI, no icterus Neck: Supple, no JVP Pulmonary: Clear, normal work of breathing Cardiovascular: Warm, no edema Abdomen: Nondistended Bowel Sounds Present MSK: No Synovitis, No Joint Effusion Neuro: Normal gait, no weakness Psych: Normal Monroe, full affect  Assessment & Plan:   Chronic cough: Present for months.  Preceded rhinovirus diagnosis and hospitalization although certainly worsened over this illness acutely.  Associated with seasonal changes.  High suspicion for allergy driven cough specifically postnasal drip with sinus congestion, rhinorrhea.  Postviral cough syndrome and development of asthma also possible.  No real improvement with Flonase, cetirizine, Afrin therapy.  She now reports congestion more in her chest.  Advair Diskus high-dose 1 puff twice daily to see if this can improve things.  Lung nodules: Multiple less than 6 mm.  Low risk.  No further images needed per Fleischner criteria.   Return in about 3 months (around 10/18/2023).   Karren Burly, MD 07/18/2023

## 2023-07-19 DIAGNOSIS — M9905 Segmental and somatic dysfunction of pelvic region: Secondary | ICD-10-CM | POA: Diagnosis not present

## 2023-07-19 DIAGNOSIS — M9901 Segmental and somatic dysfunction of cervical region: Secondary | ICD-10-CM | POA: Diagnosis not present

## 2023-07-19 DIAGNOSIS — M9903 Segmental and somatic dysfunction of lumbar region: Secondary | ICD-10-CM | POA: Diagnosis not present

## 2023-07-19 DIAGNOSIS — M9904 Segmental and somatic dysfunction of sacral region: Secondary | ICD-10-CM | POA: Diagnosis not present

## 2023-07-20 ENCOUNTER — Ambulatory Visit
Admission: RE | Admit: 2023-07-20 | Discharge: 2023-07-20 | Disposition: A | Discharge status: Home / Self Care | Payer: Medicare Other | Source: Ambulatory Visit | Attending: Internal Medicine | Admitting: Internal Medicine

## 2023-07-20 DIAGNOSIS — Z1231 Encounter for screening mammogram for malignant neoplasm of breast: Secondary | ICD-10-CM | POA: Diagnosis not present

## 2023-07-25 DIAGNOSIS — M9904 Segmental and somatic dysfunction of sacral region: Secondary | ICD-10-CM | POA: Diagnosis not present

## 2023-07-25 DIAGNOSIS — M9905 Segmental and somatic dysfunction of pelvic region: Secondary | ICD-10-CM | POA: Diagnosis not present

## 2023-07-25 DIAGNOSIS — M9903 Segmental and somatic dysfunction of lumbar region: Secondary | ICD-10-CM | POA: Diagnosis not present

## 2023-07-25 DIAGNOSIS — M9901 Segmental and somatic dysfunction of cervical region: Secondary | ICD-10-CM | POA: Diagnosis not present

## 2023-07-29 ENCOUNTER — Other Ambulatory Visit: Payer: Self-pay | Admitting: Internal Medicine

## 2023-07-29 DIAGNOSIS — R609 Edema, unspecified: Secondary | ICD-10-CM

## 2023-08-02 DIAGNOSIS — M9901 Segmental and somatic dysfunction of cervical region: Secondary | ICD-10-CM | POA: Diagnosis not present

## 2023-08-02 DIAGNOSIS — M9903 Segmental and somatic dysfunction of lumbar region: Secondary | ICD-10-CM | POA: Diagnosis not present

## 2023-08-02 DIAGNOSIS — M9905 Segmental and somatic dysfunction of pelvic region: Secondary | ICD-10-CM | POA: Diagnosis not present

## 2023-08-02 DIAGNOSIS — M9904 Segmental and somatic dysfunction of sacral region: Secondary | ICD-10-CM | POA: Diagnosis not present

## 2023-08-10 DIAGNOSIS — M9903 Segmental and somatic dysfunction of lumbar region: Secondary | ICD-10-CM | POA: Diagnosis not present

## 2023-08-10 DIAGNOSIS — M9904 Segmental and somatic dysfunction of sacral region: Secondary | ICD-10-CM | POA: Diagnosis not present

## 2023-08-10 DIAGNOSIS — M9901 Segmental and somatic dysfunction of cervical region: Secondary | ICD-10-CM | POA: Diagnosis not present

## 2023-08-10 DIAGNOSIS — M9905 Segmental and somatic dysfunction of pelvic region: Secondary | ICD-10-CM | POA: Diagnosis not present

## 2023-08-17 ENCOUNTER — Other Ambulatory Visit: Payer: Self-pay

## 2023-08-17 ENCOUNTER — Telehealth: Payer: Medicare Other | Admitting: Family Medicine

## 2023-08-17 ENCOUNTER — Encounter: Payer: Self-pay | Admitting: Family Medicine

## 2023-08-17 DIAGNOSIS — R609 Edema, unspecified: Secondary | ICD-10-CM

## 2023-08-17 DIAGNOSIS — G43909 Migraine, unspecified, not intractable, without status migrainosus: Secondary | ICD-10-CM

## 2023-08-17 MED ORDER — FUROSEMIDE 20 MG PO TABS: 20.0000 mg | ORAL_TABLET | Freq: Every day | ORAL | 1 refills | Status: DC

## 2023-08-17 NOTE — Progress Notes (Signed)
"  Patient was unable to self-report due to a lack of equipment at home via telehealth"

## 2023-08-17 NOTE — Progress Notes (Signed)
Virtual Visit via Video Note  I connected with Michele Monroe on 08/17/23 at  1:15 PM EST by a video enabled telemedicine application and verified that I am speaking with the correct person using two identifiers.  Location patient: home Location provider:work or home office Persons participating in the virtual visit: patient, provider  I discussed the limitations of evaluation and management by telemedicine and the availability of in person appointments. The patient expressed understanding and agreed to proceed. Chief Complaint  Patient presents with   Headache    Patient is having headache and vomiting, started 5 days ago, patient is unable to take medication or eat,     HPI: Patient is a 76 year old female followed by Dr. Fabian Sharp and seen for acute concern.  Patient with headache x 3 days.  States was unable to keep down water on Monday and yesterday.   Pt has not been able to take her regular medicines.  Pain in head is in frontal area and moves to back.  A throbbing sensation.  Pt denies dizziness, fever, sick contacts.   Pt states she feels like she is unable to drive as she feels weak from not eating.   ROS: See pertinent positives and negatives per HPI.  Past Medical History:  Diagnosis Date   Adenomatous colon polyp    ALLERGIC RHINITIS 06/30/2010   perennial   Allergy    seasonal allergies   Anxiety    hx of   Arthritis    bilateral hands (R>L)   Coughing up blood 08/13/2012   piece of tissue? will send to path poss from upper airway sinusitis  Final path was "abscess"    Depression    hx of   GERD 06/30/2010   on meds   Headache(784.0) 06/30/2010   migraines   Hepatic steatosis    HYPERLIPIDEMIA 06/30/2010   on meds   HYPERTENSION 06/30/2010   on meds   IBS 06/30/2010   Internal hemorrhoids    Renal lesion    Varicose veins    VITAMIN D DEFICIENCY 06/30/2010    Past Surgical History:  Procedure Laterality Date   BREAST CYST ASPIRATION     BREAST SURGERY      needle bx left breast   COLONOSCOPY  2016   KN-prep good-TA   feet     hammer toe sx   POLYPECTOMY  2016   TA   ROTATOR CUFF REPAIR Right    varicose veins      Family History  Problem Relation Age of Onset   Colon cancer Mother        age 7   Colon polyps Mother    Breast cancer Cousin    Arthritis Brother    Other Brother    Colon polyps Brother    Esophageal cancer Neg Hx    Stomach cancer Neg Hx    Rectal cancer Neg Hx     Current Outpatient Medications:    albuterol (VENTOLIN HFA) 108 (90 Base) MCG/ACT inhaler, Inhale 2 puffs by mouth every 6 hours as needed. LAST refill pt needs appt. (Patient taking differently: Inhale 2 puffs into the lungs every 6 (six) hours as needed for wheezing or shortness of breath.), Disp: 8.5 each, Rfl: 11   atenolol (TENORMIN) 25 MG tablet, TAKE 1 TABLET BY MOUTH EVERY DAY, Disp: 90 tablet, Rfl: 2   atorvastatin (LIPITOR) 20 MG tablet, TAKE 1 TABLET BY MOUTH EVERY DAY, Disp: 90 tablet, Rfl: 1   cetirizine (ZYRTEC) 10 MG chewable  tablet, Chew 10 mg by mouth daily., Disp: , Rfl:    fluticasone (FLONASE) 50 MCG/ACT nasal spray, Place 1 spray into both nostrils daily., Disp: 16 mL, Rfl: 2   fluticasone-salmeterol (ADVAIR DISKUS) 500-50 MCG/ACT AEPB, Inhale 1 puff into the lungs in the morning and at bedtime., Disp: 60 each, Rfl: 3   furosemide (LASIX) 20 MG tablet, TAKE 1 TABLET BY MOUTH EVERY DAY, Disp: 90 tablet, Rfl: 1   hyoscyamine (LEVSIN SL) 0.125 MG SL tablet, DISSOLVE 1 TABLET UNDER THE TONGUE EVERY MORNING AS NEEDED, Disp: 90 tablet, Rfl: 1   ibuprofen (ADVIL) 200 MG tablet, Take 400 mg by mouth every 6 (six) hours as needed for headache., Disp: , Rfl:    omeprazole (PRILOSEC) 20 MG capsule, TAKE 1 CAPSULE BY MOUTH TWICE A DAY BEFORE A MEAL, Disp: 180 capsule, Rfl: 0   PATADAY 0.1 % ophthalmic solution, Place 1 drop into both eyes 2 (two) times daily as needed for allergies., Disp: , Rfl:    rizatriptan (MAXALT) 10 MG tablet, Take 1 tablet  (10 mg total) by mouth as needed for migraine (May repeat after 2 hours.  Maximum 2 tablets in 24 hours.). May repeat in 2 hours if needed, Disp: 11 tablet, Rfl: 5   tiZANidine (ZANAFLEX) 4 MG tablet, TAKE 1/2 TO 1 TABLET BY MOUTH AT BEDTIME AS NEEDED FOR NECK PAIN., Disp: 90 tablet, Rfl: 1   Ubrogepant (UBRELVY) 100 MG TABS, Take 1 tablet (100 mg total) by mouth as needed (take 1 tab at the earlist onset of a Migraine. May repeat in 2 hours. Max 2 tab in 24 hours)., Disp: 8 tablet, Rfl: 5   ipratropium-albuterol (DUONEB) 0.5-2.5 (3) MG/3ML SOLN, Take 3 mLs by nebulization every 6 (six) hours as needed (Shortness of breath/wheezing)., Disp: 360 mL, Rfl: 0  EXAM:  VITALS per patient if applicable: RR between 12-20 bpm GENERAL: alert, oriented, appears well and in no acute distress  HEENT: atraumatic, conjunctiva clear, no obvious abnormalities on inspection of external nose and ears  NECK: normal movements of the head and neck  LUNGS: on inspection no signs of respiratory distress, breathing rate appears normal, no obvious gross SOB, gasping or wheezing  CV: no obvious cyanosis  MS: moves all visible extremities without noticeable abnormality  PSYCH/NEURO: pleasant and cooperative, no obvious depression or anxiety, speech and thought processing grossly intact  ASSESSMENT AND PLAN:  Discussed the following assessment and plan:  Migraine without status migrainosus, not intractable, unspecified migraine type -Pt advised to check at home COVID test. Parke Simmers diet advance as tolerated. -Patient encouraged to try taking migraine medication and eating crackers since she has been tolerating fluids -Discussed Zofran as needed however patient states she is unable to get meds from pharmacy -Given strict precautions.  Patient declines going to ED for continued or worsening symptoms  Follow-up with PCP and neurology  I discussed the assessment and treatment plan with the patient. The patient was  provided an opportunity to ask questions and all were answered. The patient agreed with the plan and demonstrated an understanding of the instructions.   The patient was advised to call back or seek an in-person evaluation if the symptoms worsen or if the condition fails to improve as anticipated.   Deeann Saint, MD

## 2023-08-19 DIAGNOSIS — M9905 Segmental and somatic dysfunction of pelvic region: Secondary | ICD-10-CM | POA: Diagnosis not present

## 2023-08-19 DIAGNOSIS — M9901 Segmental and somatic dysfunction of cervical region: Secondary | ICD-10-CM | POA: Diagnosis not present

## 2023-08-19 DIAGNOSIS — M9904 Segmental and somatic dysfunction of sacral region: Secondary | ICD-10-CM | POA: Diagnosis not present

## 2023-08-19 DIAGNOSIS — M9903 Segmental and somatic dysfunction of lumbar region: Secondary | ICD-10-CM | POA: Diagnosis not present

## 2023-08-22 DIAGNOSIS — M9904 Segmental and somatic dysfunction of sacral region: Secondary | ICD-10-CM | POA: Diagnosis not present

## 2023-08-22 DIAGNOSIS — M9901 Segmental and somatic dysfunction of cervical region: Secondary | ICD-10-CM | POA: Diagnosis not present

## 2023-08-22 DIAGNOSIS — M9903 Segmental and somatic dysfunction of lumbar region: Secondary | ICD-10-CM | POA: Diagnosis not present

## 2023-08-22 DIAGNOSIS — M9905 Segmental and somatic dysfunction of pelvic region: Secondary | ICD-10-CM | POA: Diagnosis not present

## 2023-08-29 DIAGNOSIS — M9903 Segmental and somatic dysfunction of lumbar region: Secondary | ICD-10-CM | POA: Diagnosis not present

## 2023-08-29 DIAGNOSIS — M9901 Segmental and somatic dysfunction of cervical region: Secondary | ICD-10-CM | POA: Diagnosis not present

## 2023-08-29 DIAGNOSIS — M9904 Segmental and somatic dysfunction of sacral region: Secondary | ICD-10-CM | POA: Diagnosis not present

## 2023-08-29 DIAGNOSIS — M9905 Segmental and somatic dysfunction of pelvic region: Secondary | ICD-10-CM | POA: Diagnosis not present

## 2023-09-05 DIAGNOSIS — M9901 Segmental and somatic dysfunction of cervical region: Secondary | ICD-10-CM | POA: Diagnosis not present

## 2023-09-05 DIAGNOSIS — M9904 Segmental and somatic dysfunction of sacral region: Secondary | ICD-10-CM | POA: Diagnosis not present

## 2023-09-05 DIAGNOSIS — M9903 Segmental and somatic dysfunction of lumbar region: Secondary | ICD-10-CM | POA: Diagnosis not present

## 2023-09-05 DIAGNOSIS — M9905 Segmental and somatic dysfunction of pelvic region: Secondary | ICD-10-CM | POA: Diagnosis not present

## 2023-09-12 DIAGNOSIS — M9904 Segmental and somatic dysfunction of sacral region: Secondary | ICD-10-CM | POA: Diagnosis not present

## 2023-09-12 DIAGNOSIS — M9901 Segmental and somatic dysfunction of cervical region: Secondary | ICD-10-CM | POA: Diagnosis not present

## 2023-09-12 DIAGNOSIS — M9905 Segmental and somatic dysfunction of pelvic region: Secondary | ICD-10-CM | POA: Diagnosis not present

## 2023-09-12 DIAGNOSIS — M9903 Segmental and somatic dysfunction of lumbar region: Secondary | ICD-10-CM | POA: Diagnosis not present

## 2023-09-19 DIAGNOSIS — M9903 Segmental and somatic dysfunction of lumbar region: Secondary | ICD-10-CM | POA: Diagnosis not present

## 2023-09-19 DIAGNOSIS — M9904 Segmental and somatic dysfunction of sacral region: Secondary | ICD-10-CM | POA: Diagnosis not present

## 2023-09-19 DIAGNOSIS — M9905 Segmental and somatic dysfunction of pelvic region: Secondary | ICD-10-CM | POA: Diagnosis not present

## 2023-09-19 DIAGNOSIS — M9901 Segmental and somatic dysfunction of cervical region: Secondary | ICD-10-CM | POA: Diagnosis not present

## 2023-10-03 DIAGNOSIS — M9901 Segmental and somatic dysfunction of cervical region: Secondary | ICD-10-CM | POA: Diagnosis not present

## 2023-10-03 DIAGNOSIS — M9903 Segmental and somatic dysfunction of lumbar region: Secondary | ICD-10-CM | POA: Diagnosis not present

## 2023-10-03 DIAGNOSIS — M9905 Segmental and somatic dysfunction of pelvic region: Secondary | ICD-10-CM | POA: Diagnosis not present

## 2023-10-03 DIAGNOSIS — M9904 Segmental and somatic dysfunction of sacral region: Secondary | ICD-10-CM | POA: Diagnosis not present

## 2023-10-10 DIAGNOSIS — M9905 Segmental and somatic dysfunction of pelvic region: Secondary | ICD-10-CM | POA: Diagnosis not present

## 2023-10-10 DIAGNOSIS — M9901 Segmental and somatic dysfunction of cervical region: Secondary | ICD-10-CM | POA: Diagnosis not present

## 2023-10-10 DIAGNOSIS — M9904 Segmental and somatic dysfunction of sacral region: Secondary | ICD-10-CM | POA: Diagnosis not present

## 2023-10-10 DIAGNOSIS — M9903 Segmental and somatic dysfunction of lumbar region: Secondary | ICD-10-CM | POA: Diagnosis not present

## 2023-10-17 DIAGNOSIS — M9904 Segmental and somatic dysfunction of sacral region: Secondary | ICD-10-CM | POA: Diagnosis not present

## 2023-10-17 DIAGNOSIS — M9901 Segmental and somatic dysfunction of cervical region: Secondary | ICD-10-CM | POA: Diagnosis not present

## 2023-10-17 DIAGNOSIS — M9905 Segmental and somatic dysfunction of pelvic region: Secondary | ICD-10-CM | POA: Diagnosis not present

## 2023-10-17 DIAGNOSIS — M9903 Segmental and somatic dysfunction of lumbar region: Secondary | ICD-10-CM | POA: Diagnosis not present

## 2023-10-18 ENCOUNTER — Ambulatory Visit (INDEPENDENT_AMBULATORY_CARE_PROVIDER_SITE_OTHER): Payer: Medicare HMO | Admitting: Pulmonary Disease

## 2023-10-18 ENCOUNTER — Encounter: Payer: Self-pay | Admitting: Pulmonary Disease

## 2023-10-18 VITALS — BP 143/78 | HR 74 | Ht 65.0 in | Wt 176.6 lb

## 2023-10-18 DIAGNOSIS — J40 Bronchitis, not specified as acute or chronic: Secondary | ICD-10-CM

## 2023-10-18 NOTE — Progress Notes (Signed)
@Patient  ID: Michele Monroe, female    DOB: Mar 27, 1946, 78 y.o.   MRN: [id number]  Chief Complaint  Patient presents with   Follow-up    Pt states she not coughing as bad, rx unclear instructions says 1 puff .Marland Kitchen Vs what she was told of 2 puffs.    Referring provider: [name], MD  HPI:   78 y.o. woman whom we are seeing for evaluation of chronic cough.  Most recent note from PCP office reviewed.    Returns for routine follow-up.  Chronic cough.  Mild, minimal improvement with nasal regimen.  Advair prescribed.  Using 1 puff once a day.  Discus high-dose.  Cough is largely gone.  We discussed prescriptions for twice a day but since symptoms are well-controlled on once a day okay to continue this.  She notes it is more expensive in the new year compared to prior.  HPI at initial visit Patient developed cough a few months ago.  May be started around April 2024.  She associates this with pollen season, change of seasons.  Productive of minimal phlegm but usually productive.  About a month later scheduled follow-up with PCP about cough.  That same day her cough became more violent more significant.  Loss of voice.  Difficulty breathing.  The sent to the ED from doctor's office.  She was treated with antibiotics and steroids given some concern for wheezing.  She simply had a rhinovirus infection on her respiratory viral panel.  Supportive care was given.  Discharge from the hospital.  Gradually her time her cough has improved.  Cough seems little bit different now.  She endorses some rhinorrhea and postnasal drip.  His light cough is worsened a bit at the warmer weather as of late where prior it was doing a bit better.  Reviewed her recent chest imaging CT scan 03/2019 for the lung nodule appropriate reveals likely scarring throughout the bases, very mild bronchiectasis right lower lobe, radiology read comments on scattered small less than 5 mm solid pulmonary nodules.  Otherwise clear  lungs.  Questionaires / Pulmonary Flowsheets:   ACT:      No data to display          MMRC:     No data to display          Epworth:      No data to display          Tests:   FENO:  No results found for: "NITRICOXIDE"  PFT:     No data to display          WALK:      No data to display          Imaging: Personally reviewed and as per EMR and discussion in this note No results found.  Lab Results: Personally reviewed CBC    Component Value Date/Time   WBC 10.5 01/28/2023 0410   RBC 4.46 01/28/2023 0410   HGB 12.8 01/28/2023 0410   HCT 38.5 01/28/2023 0410   PLT 209 01/28/2023 0410   MCV 86.3 01/28/2023 0410   MCH 28.7 01/28/2023 0410   MCHC 33.2 01/28/2023 0410   RDW 12.4 01/28/2023 0410   LYMPHSABS 2.5 01/27/2023 1219   MONOABS 1.3 (H) 01/27/2023 1219   EOSABS 0.7 (H) 01/27/2023 1219   BASOSABS 0.1 01/27/2023 1219    BMET    Component Value Date/Time   NA 140 01/28/2023 0410   K 4.1 01/28/2023 0410   CL 105  01/28/2023 0410   CO2 26 01/28/2023 0410   GLUCOSE 103 (H) 01/28/2023 0410   BUN 19 01/28/2023 0410   CREATININE 0.74 01/28/2023 0410   CALCIUM 9.0 01/28/2023 0410   GFRNONAA >60 01/28/2023 0410    BNP    Component Value Date/Time   BNP 38.3 01/27/2023 1219    ProBNP No results found for: "PROBNP"  Specialty Problems       Pulmonary Problems   ALLERGIC RHINITIS   Qualifier: Diagnosis of  By: Fabian Sharp MD, Neta Mends  Replacing diagnoses that were inactivated after the 12/06/22 regulatory import      Coughing up blood   piece of tissue? will send to path poss from upper airway sinusitis  Final path was "abscess"      Recurrent acute sinusitis   becausse of congestion and blood issue antibiotic today empiric.       Allergic rhinitis ?   no complication noted today chest is clear hs of recurrent sinus infections      Bronchitis    No Known Allergies  Immunization History  Administered Date(s)  Administered   Fluad Quad(high Dose 65+) 05/02/2019   Influenza Split 06/05/2012, 05/15/2013   Influenza, High Dose Seasonal PF 04/20/2016, 06/23/2017, 06/19/2018   Influenza,inj,Quad PF,6+ Mos 06/03/2015   Influenza-Unspecified 06/06/2014   Pneumococcal Conjugate-13 08/07/2012   Pneumococcal Polysaccharide-23 09/24/2012   Td 09/07/2007   Zoster, Live 09/24/2012    Past Medical History:  Diagnosis Date   Adenomatous colon polyp    ALLERGIC RHINITIS 06/30/2010   perennial   Allergy    seasonal allergies   Anxiety    hx of   Arthritis    bilateral hands (R>L)   Coughing up blood 08/13/2012   piece of tissue? will send to path poss from upper airway sinusitis  Final path was "abscess"    Depression    hx of   GERD 06/30/2010   on meds   Headache(784.0) 06/30/2010   migraines   Hepatic steatosis    HYPERLIPIDEMIA 06/30/2010   on meds   HYPERTENSION 06/30/2010   on meds   IBS 06/30/2010   Internal hemorrhoids    Renal lesion    Varicose veins    VITAMIN D DEFICIENCY 06/30/2010    Tobacco History: Social History   Tobacco Use  Smoking Status Former   Current packs/day: 0.00   Types: Cigarettes   Quit date: 09/06/1976   Years since quitting: 47.1  Smokeless Tobacco Never   Counseling given: Not Answered   Continue to not smoke  Outpatient Encounter Medications as of 77/07/2024  Medication Sig   atenolol (TENORMIN) 25 MG tablet TAKE 1 TABLET BY MOUTH EVERY DAY   atorvastatin (LIPITOR) 20 MG tablet TAKE 1 TABLET BY MOUTH EVERY DAY   cetirizine (ZYRTEC) 10 MG chewable tablet Chew 10 mg by mouth daily.   fluticasone (FLONASE) 50 MCG/ACT nasal spray Place 1 spray into both nostrils daily.   fluticasone-salmeterol (ADVAIR DISKUS) 500-50 MCG/ACT AEPB Inhale 1 puff into the lungs in the morning and at bedtime.   furosemide (LASIX) 20 MG tablet Take 1 tablet (20 mg total) by mouth daily.   hyoscyamine (LEVSIN SL) 0.125 MG SL tablet DISSOLVE 1 TABLET UNDER THE TONGUE  EVERY MORNING AS NEEDED   ibuprofen (ADVIL) 200 MG tablet Take 400 mg by mouth every 6 (six) hours as needed for headache.   omeprazole (PRILOSEC) 20 MG capsule TAKE 1 CAPSULE BY MOUTH TWICE A DAY BEFORE A MEAL   PATADAY  0.1 % ophthalmic solution Place 1 drop into both eyes 2 (two) times daily as needed for allergies.   rizatriptan (MAXALT) 10 MG tablet Take 1 tablet (10 mg total) by mouth as needed for migraine (May repeat after 2 hours.  Maximum 2 tablets in 24 hours.). May repeat in 2 hours if needed   tiZANidine (ZANAFLEX) 4 MG tablet TAKE 1/2 TO 1 TABLET BY MOUTH AT BEDTIME AS NEEDED FOR NECK PAIN.   Ubrogepant (UBRELVY) 100 MG TABS Take 1 tablet (100 mg total) by mouth as needed (take 1 tab at the earlist onset of a Migraine. May repeat in 2 hours. Max 2 tab in 24 hours).   albuterol (VENTOLIN HFA) 108 (90 Base) MCG/ACT inhaler Inhale 2 puffs by mouth every 6 hours as needed. LAST refill pt needs appt. (Patient not taking: Reported on 10/18/2023)   ipratropium-albuterol (DUONEB) 0.5-2.5 (3) MG/3ML SOLN Take 3 mLs by nebulization every 6 (six) hours as needed (Shortness of breath/wheezing).   No facility-administered encounter medications on file as of 10/18/2023.     Review of Systems  Review of Systems  N/a Physical Exam  BP (!) 143/78 (BP Location: Left Arm, Patient Position: Sitting, Cuff Size: Normal)   Pulse 74   Ht 5\' 5"  (1.651 m)   Wt 176 lb 9.6 oz (80.1 kg)   SpO2 97%   BMI 29.39 kg/m   Wt Readings from Last 5 Encounters:  10/18/23 176 lb 9.6 oz (80.1 kg)  07/18/23 175 lb (79.4 kg)  04/13/23 172 lb 12.8 oz (78.4 kg)  03/28/23 174 lb (78.9 kg)  01/27/23 168 lb (76.2 kg)    BMI Readings from Last 5 Encounters:  10/18/23 29.39 kg/m  07/18/23 29.12 kg/m  04/13/23 28.76 kg/m  03/28/23 28.96 kg/m  01/27/23 27.96 kg/m     Physical Exam General: Sitting in chair, no acute distress Eyes: EOMI, no icterus Neck: Supple, no JVP Pulmonary: Clear, normal work of  breathing Cardiovascular: Warm, no edema MSK: No Synovitis, No Joint Effusion Neuro: Normal gait, no weakness Psych: Normal Monroe, full affect  Assessment & Plan:   Chronic cough: Present for months.  Preceded rhinovirus diagnosis and hospitalization although certainly worsened over this illness acutely.  Associated with seasonal changes.  High suspicion for allergy driven cough specifically postnasal drip with sinus congestion, rhinorrhea.  Postviral cough syndrome and development of asthma also possible.  No real improvement with Flonase, cetirizine, Afrin therapy.  Subsequent reports of chest congestion.  Advair high-dose discus 1 puff twice a day prescribed, only using once daily.  Cough largely gone.  Continue 1 puff once a day.  Lung nodules: Multiple less than 6 mm.  Low risk.  No further images needed per Fleischner criteria.   Return in about 3 months (around 01/15/2024) for f/u Dr. Judeth Horn.   Karren Burly, MD 10/18/2023

## 2023-10-18 NOTE — Patient Instructions (Signed)
Continue Advair 1 puff once a day  Remains too expensive STEMI message I will look for cost effective solution  Return to clinic in 3 months or sooner as needed with Dr. Judeth Horn

## 2023-10-18 NOTE — Progress Notes (Signed)
@Patient  ID: Michele Monroe, female    DOB: 1946-03-18, 78 y.o.   MRN: [id number]  Chief Complaint  Patient presents with   Follow-up    Pt states she not coughing as bad, rx unclear instructions says 1 puff .Marland Kitchen Vs what she was told of 2 puffs.    Referring provider: [name], MD  HPI:   78 y.o. woman whom we are seeing for evaluation of chronic cough.  Multiple PCP notes reviewed.    Returns for routine follow-up.  At last visit we try to target nasal therapies to see if it help with congestion and cough.  Does not seem to improve very much.  Seen by her PCP about 2 weeks ago.  Given doxycycline.  The seem to have helped.  At least with the acute worsening of cough.  However she still has chronic cough.  Productive of phlegm.  We discussed role and rationale for asthma therapies given failure of intranasal therapies.  See if this can target congestion and phlegm production and decrease cough.  HPI at initial visit Patient developed cough a few months ago.  May be started around April 2024.  She associates this with pollen season, change of seasons.  Productive of minimal phlegm but usually productive.  About a month later scheduled follow-up with PCP about cough.  That same day her cough became more violent more significant.  Loss of voice.  Difficulty breathing.  The sent to the ED from doctor's office.  She was treated with antibiotics and steroids given some concern for wheezing.  She simply had a rhinovirus infection on her respiratory viral panel.  Supportive care was given.  Discharge from the hospital.  Gradually her time her cough has improved.  Cough seems little bit different now.  She endorses some rhinorrhea and postnasal drip.  His light cough is worsened a bit at the warmer weather as of late where prior it was doing a bit better.  Reviewed her recent chest imaging CT scan 03/2019 for the lung nodule appropriate reveals likely scarring throughout the bases, very mild  bronchiectasis right lower lobe, radiology read comments on scattered small less than 5 mm solid pulmonary nodules.  Otherwise clear lungs.  Questionaires / Pulmonary Flowsheets:   ACT:      No data to display          MMRC:     No data to display          Epworth:      No data to display          Tests:   FENO:  No results found for: "NITRICOXIDE"  PFT:     No data to display          WALK:      No data to display          Imaging: Personally reviewed and as per EMR and discussion in this note No results found.  Lab Results: Personally reviewed CBC    Component Value Date/Time   WBC 10.5 01/28/2023 0410   RBC 4.46 01/28/2023 0410   HGB 12.8 01/28/2023 0410   HCT 38.5 01/28/2023 0410   PLT 209 01/28/2023 0410   MCV 86.3 01/28/2023 0410   MCH 28.7 01/28/2023 0410   MCHC 33.2 01/28/2023 0410   RDW 12.4 01/28/2023 0410   LYMPHSABS 2.5 01/27/2023 1219   MONOABS 1.3 (H) 01/27/2023 1219   EOSABS 0.7 (H) 01/27/2023 1219   BASOSABS 0.1 01/27/2023 1219  BMET    Component Value Date/Time   NA 140 01/28/2023 0410   K 4.1 01/28/2023 0410   CL 105 01/28/2023 0410   CO2 26 01/28/2023 0410   GLUCOSE 103 (H) 01/28/2023 0410   BUN 19 01/28/2023 0410   CREATININE 0.74 01/28/2023 0410   CALCIUM 9.0 01/28/2023 0410   GFRNONAA >60 01/28/2023 0410    BNP    Component Value Date/Time   BNP 38.3 01/27/2023 1219    ProBNP No results found for: "PROBNP"  Specialty Problems       Pulmonary Problems   ALLERGIC RHINITIS   Qualifier: Diagnosis of  By: Fabian Sharp MD, Neta Mends  Replacing diagnoses that were inactivated after the 12/06/22 regulatory import      Coughing up blood   piece of tissue? will send to path poss from upper airway sinusitis  Final path was "abscess"      Recurrent acute sinusitis   becausse of congestion and blood issue antibiotic today empiric.       Allergic rhinitis ?   no complication noted today chest is  clear hs of recurrent sinus infections      Bronchitis    No Known Allergies  Immunization History  Administered Date(s) Administered   Fluad Quad(high Dose 65+) 05/02/2019   Influenza Split 06/05/2012, 05/15/2013   Influenza, High Dose Seasonal PF 04/20/2016, 06/23/2017, 06/19/2018   Influenza,inj,Quad PF,6+ Mos 06/03/2015   Influenza-Unspecified 06/06/2014   Pneumococcal Conjugate-13 08/07/2012   Pneumococcal Polysaccharide-23 09/24/2012   Td 09/07/2007   Zoster, Live 09/24/2012    Past Medical History:  Diagnosis Date   Adenomatous colon polyp    ALLERGIC RHINITIS 06/30/2010   perennial   Allergy    seasonal allergies   Anxiety    hx of   Arthritis    bilateral hands (R>L)   Coughing up blood 08/13/2012   piece of tissue? will send to path poss from upper airway sinusitis  Final path was "abscess"    Depression    hx of   GERD 06/30/2010   on meds   Headache(784.0) 06/30/2010   migraines   Hepatic steatosis    HYPERLIPIDEMIA 06/30/2010   on meds   HYPERTENSION 06/30/2010   on meds   IBS 06/30/2010   Internal hemorrhoids    Renal lesion    Varicose veins    VITAMIN D DEFICIENCY 06/30/2010    Tobacco History: Social History   Tobacco Use  Smoking Status Former   Current packs/day: 0.00   Types: Cigarettes   Quit date: 09/06/1976   Years since quitting: 47.1  Smokeless Tobacco Never   Counseling given: Not Answered   Continue to not smoke  Outpatient Encounter Medications as of 10/18/2023  Medication Sig   atenolol (TENORMIN) 25 MG tablet TAKE 1 TABLET BY MOUTH EVERY DAY   atorvastatin (LIPITOR) 20 MG tablet TAKE 1 TABLET BY MOUTH EVERY DAY   cetirizine (ZYRTEC) 10 MG chewable tablet Chew 10 mg by mouth daily.   fluticasone (FLONASE) 50 MCG/ACT nasal spray Place 1 spray into both nostrils daily.   fluticasone-salmeterol (ADVAIR DISKUS) 500-50 MCG/ACT AEPB Inhale 1 puff into the lungs in the morning and at bedtime.   furosemide (LASIX) 20 MG  tablet Take 1 tablet (20 mg total) by mouth daily.   hyoscyamine (LEVSIN SL) 0.125 MG SL tablet DISSOLVE 1 TABLET UNDER THE TONGUE EVERY MORNING AS NEEDED   ibuprofen (ADVIL) 200 MG tablet Take 400 mg by mouth every 6 (six) hours as needed  for headache.   omeprazole (PRILOSEC) 20 MG capsule TAKE 1 CAPSULE BY MOUTH TWICE A DAY BEFORE A MEAL   PATADAY 0.1 % ophthalmic solution Place 1 drop into both eyes 2 (two) times daily as needed for allergies.   rizatriptan (MAXALT) 10 MG tablet Take 1 tablet (10 mg total) by mouth as needed for migraine (May repeat after 2 hours.  Maximum 2 tablets in 24 hours.). May repeat in 2 hours if needed   tiZANidine (ZANAFLEX) 4 MG tablet TAKE 1/2 TO 1 TABLET BY MOUTH AT BEDTIME AS NEEDED FOR NECK PAIN.   Ubrogepant (UBRELVY) 100 MG TABS Take 1 tablet (100 mg total) by mouth as needed (take 1 tab at the earlist onset of a Migraine. May repeat in 2 hours. Max 2 tab in 24 hours).   albuterol (VENTOLIN HFA) 108 (90 Base) MCG/ACT inhaler Inhale 2 puffs by mouth every 6 hours as needed. LAST refill pt needs appt. (Patient not taking: Reported on 10/18/2023)   ipratropium-albuterol (DUONEB) 0.5-2.5 (3) MG/3ML SOLN Take 3 mLs by nebulization every 6 (six) hours as needed (Shortness of breath/wheezing).   No facility-administered encounter medications on file as of 10/18/2023.     Review of Systems  Review of Systems  N/a Physical Exam  BP (!) 143/78 (BP Location: Left Arm, Patient Position: Sitting, Cuff Size: Normal)   Pulse 74   Ht 5\' 5"  (1.651 m)   Wt 176 lb 9.6 oz (80.1 kg)   SpO2 97%   BMI 29.39 kg/m   Wt Readings from Last 5 Encounters:  10/18/23 176 lb 9.6 oz (80.1 kg)  07/18/23 175 lb (79.4 kg)  04/13/23 172 lb 12.8 oz (78.4 kg)  03/28/23 174 lb (78.9 kg)  01/27/23 168 lb (76.2 kg)    BMI Readings from Last 5 Encounters:  10/18/23 29.39 kg/m  07/18/23 29.12 kg/m  04/13/23 28.76 kg/m  03/28/23 28.96 kg/m  01/27/23 27.96 kg/m     Physical  Exam General: Sitting in chair, no acute distress Eyes: EOMI, no icterus Neck: Supple, no JVP Pulmonary: Clear, normal work of breathing Cardiovascular: Warm, no edema Abdomen: Nondistended Bowel Sounds Present MSK: No Synovitis, No Joint Effusion Neuro: Normal gait, no weakness Psych: Normal Monroe, full affect  Assessment & Plan:   Chronic cough due to cough variant asthma.: Present for months.  Preceded rhinovirus diagnosis and hospitalization although certainly worsened over this illness acutely.  Associated with seasonal changes.  High suspicion for allergy driven cough specifically postnasal drip with sinus congestion, rhinorrhea.  Postviral cough syndrome and development of asthma also possible.  No real improvement with Flonase, cetirizine, Afrin therapy.  She now reports congestion more in her chest.  Advair Diskus high-dose 1 puff twice daily to see if this can improve things.  Lung nodules: Multiple less than 6 mm.  Low risk.  No further images needed per Fleischner criteria.   No follow-ups on file.   Karren Burly, MD 10/18/2023

## 2023-10-25 ENCOUNTER — Other Ambulatory Visit: Payer: Self-pay | Admitting: Family

## 2023-10-25 ENCOUNTER — Other Ambulatory Visit: Payer: Self-pay | Admitting: Internal Medicine

## 2023-10-31 DIAGNOSIS — M9905 Segmental and somatic dysfunction of pelvic region: Secondary | ICD-10-CM | POA: Diagnosis not present

## 2023-10-31 DIAGNOSIS — M9904 Segmental and somatic dysfunction of sacral region: Secondary | ICD-10-CM | POA: Diagnosis not present

## 2023-10-31 DIAGNOSIS — M9901 Segmental and somatic dysfunction of cervical region: Secondary | ICD-10-CM | POA: Diagnosis not present

## 2023-10-31 DIAGNOSIS — M9903 Segmental and somatic dysfunction of lumbar region: Secondary | ICD-10-CM | POA: Diagnosis not present

## 2023-11-08 DIAGNOSIS — M9901 Segmental and somatic dysfunction of cervical region: Secondary | ICD-10-CM | POA: Diagnosis not present

## 2023-11-08 DIAGNOSIS — M9905 Segmental and somatic dysfunction of pelvic region: Secondary | ICD-10-CM | POA: Diagnosis not present

## 2023-11-08 DIAGNOSIS — M9903 Segmental and somatic dysfunction of lumbar region: Secondary | ICD-10-CM | POA: Diagnosis not present

## 2023-11-08 DIAGNOSIS — M9904 Segmental and somatic dysfunction of sacral region: Secondary | ICD-10-CM | POA: Diagnosis not present

## 2023-11-16 DIAGNOSIS — M9904 Segmental and somatic dysfunction of sacral region: Secondary | ICD-10-CM | POA: Diagnosis not present

## 2023-11-16 DIAGNOSIS — M9901 Segmental and somatic dysfunction of cervical region: Secondary | ICD-10-CM | POA: Diagnosis not present

## 2023-11-16 DIAGNOSIS — M9905 Segmental and somatic dysfunction of pelvic region: Secondary | ICD-10-CM | POA: Diagnosis not present

## 2023-11-16 DIAGNOSIS — M9903 Segmental and somatic dysfunction of lumbar region: Secondary | ICD-10-CM | POA: Diagnosis not present

## 2023-11-29 ENCOUNTER — Telehealth: Payer: Self-pay | Admitting: Neurology

## 2023-11-29 NOTE — Telephone Encounter (Signed)
 Pt called in needing prior auth for her Bernita Raisin. [employer] gave her a phone number to call. [phone]  She only has 1 pill left.

## 2023-11-30 ENCOUNTER — Other Ambulatory Visit (HOSPITAL_COMMUNITY): Payer: Self-pay

## 2023-11-30 ENCOUNTER — Telehealth: Payer: Self-pay | Admitting: Pharmacy Technician

## 2023-11-30 NOTE — Telephone Encounter (Signed)
 Pharmacy Patient Advocate Encounter   Received notification from Patient Advice Request messages that prior authorization for UBRELVY 100MG  is required/requested.   Insurance verification completed.   The patient is insured through [employer] .   Per test claim: PA required; PA submitted to above mentioned insurance via CoverMyMeds Key/confirmation #/EOC [facility] Status is pending

## 2023-11-30 NOTE — Telephone Encounter (Signed)
 PA has been submitted, and telephone encounter has been created. Please see telephone encounter dated 3.26.25.

## 2023-11-30 NOTE — Telephone Encounter (Signed)
 Pharmacy Patient Advocate Encounter  Received notification from [facility] that Prior Authorization for UBRELVY 100MG  has been APPROVED from 1.1.25 to 12.31.25. Ran test claim, Copay is $181.75. This test claim was processed through [facility]- copay amounts may vary at other pharmacies due to pharmacy/plan contracts, or as the patient moves through the different stages of their insurance plan.   PA #/Case ID/Reference #: [id number]

## 2023-12-01 DIAGNOSIS — M9904 Segmental and somatic dysfunction of sacral region: Secondary | ICD-10-CM | POA: Diagnosis not present

## 2023-12-01 DIAGNOSIS — M9903 Segmental and somatic dysfunction of lumbar region: Secondary | ICD-10-CM | POA: Diagnosis not present

## 2023-12-01 DIAGNOSIS — M9905 Segmental and somatic dysfunction of pelvic region: Secondary | ICD-10-CM | POA: Diagnosis not present

## 2023-12-01 DIAGNOSIS — M9901 Segmental and somatic dysfunction of cervical region: Secondary | ICD-10-CM | POA: Diagnosis not present

## 2023-12-06 DIAGNOSIS — H0288A Meibomian gland dysfunction right eye, upper and lower eyelids: Secondary | ICD-10-CM | POA: Diagnosis not present

## 2023-12-06 DIAGNOSIS — H2513 Age-related nuclear cataract, bilateral: Secondary | ICD-10-CM | POA: Diagnosis not present

## 2023-12-07 DIAGNOSIS — M9903 Segmental and somatic dysfunction of lumbar region: Secondary | ICD-10-CM | POA: Diagnosis not present

## 2023-12-07 DIAGNOSIS — M9904 Segmental and somatic dysfunction of sacral region: Secondary | ICD-10-CM | POA: Diagnosis not present

## 2023-12-07 DIAGNOSIS — M9901 Segmental and somatic dysfunction of cervical region: Secondary | ICD-10-CM | POA: Diagnosis not present

## 2023-12-07 DIAGNOSIS — M9905 Segmental and somatic dysfunction of pelvic region: Secondary | ICD-10-CM | POA: Diagnosis not present

## 2023-12-08 ENCOUNTER — Telehealth: Payer: Self-pay

## 2023-12-08 NOTE — Telephone Encounter (Signed)
 Copied from CRM [id number]. Topic: Clinical - Medical Advice >> Dec 07, 2023  3:10 PM Renie Ora wrote: Reason for CRM: Patient called and stated she was seen in February 2025 and she is currently using Wixela 500/50 inhaler and her sister uses the same inhaler and it has caused her vision issues, patient is concerned about her vision becoming an issue. Patient would like to know if she can decrease her inhaler. Patient stated she is not coughing as much and she is down 1 puff per day. Patient would like to know if the inhaler can be decreased to 250 or 100.  Lm for patient.

## 2023-12-13 NOTE — Telephone Encounter (Signed)
 Lm x2 for patient.  Mychart message sent.

## 2023-12-14 DIAGNOSIS — M9901 Segmental and somatic dysfunction of cervical region: Secondary | ICD-10-CM | POA: Diagnosis not present

## 2023-12-14 DIAGNOSIS — M9903 Segmental and somatic dysfunction of lumbar region: Secondary | ICD-10-CM | POA: Diagnosis not present

## 2023-12-14 DIAGNOSIS — M9905 Segmental and somatic dysfunction of pelvic region: Secondary | ICD-10-CM | POA: Diagnosis not present

## 2023-12-14 DIAGNOSIS — M9904 Segmental and somatic dysfunction of sacral region: Secondary | ICD-10-CM | POA: Diagnosis not present

## 2023-12-19 ENCOUNTER — Other Ambulatory Visit: Payer: Self-pay | Admitting: Gastroenterology

## 2023-12-20 ENCOUNTER — Other Ambulatory Visit: Payer: Self-pay | Admitting: Internal Medicine

## 2023-12-20 MED ORDER — ATORVASTATIN CALCIUM 20 MG PO TABS: 20.0000 mg | ORAL_TABLET | Freq: Every day | ORAL | 1 refills | Status: DC

## 2023-12-20 NOTE — Telephone Encounter (Signed)
 Copied from CRM [id number]. Topic: Clinical - Medication Refill >> Dec 20, 2023  9:35 AM Varney Gentleman wrote: Most Recent Primary Care Visit:  Provider: Viola Greulich  Department: [facility]  Visit Type: MYCHART VIDEO VISIT  Date: 08/17/2023  Medication: atorvastatin (LIPITOR) 20 MG tablet  Has the patient contacted their pharmacy? Yes, contact provider (Agent: If no, request that the patient contact the pharmacy for the refill. If patient does not wish to contact the pharmacy document the reason why and proceed with request.) (Agent: If yes, when and what did the pharmacy advise?)  Is this the correct pharmacy for this prescription? Yes If no, delete pharmacy and type the correct one.  This is the patient's preferred pharmacy:  [id number] [place], [place] - [address] [place] [address] Phone: [phone] Fax: [phone]    Has the prescription been filled recently? No  Is the patient out of the medication? Yes  Has the patient been seen for an appointment in the last year OR does the patient have an upcoming appointment? Yes  Can we respond through MyChart? Yes  Agent: Please be advised that Rx refills may take up to 3 business days. We ask that you follow-up with your pharmacy.

## 2023-12-28 DIAGNOSIS — M9904 Segmental and somatic dysfunction of sacral region: Secondary | ICD-10-CM | POA: Diagnosis not present

## 2023-12-28 DIAGNOSIS — M9901 Segmental and somatic dysfunction of cervical region: Secondary | ICD-10-CM | POA: Diagnosis not present

## 2023-12-28 DIAGNOSIS — M9905 Segmental and somatic dysfunction of pelvic region: Secondary | ICD-10-CM | POA: Diagnosis not present

## 2023-12-28 DIAGNOSIS — M9903 Segmental and somatic dysfunction of lumbar region: Secondary | ICD-10-CM | POA: Diagnosis not present

## 2023-12-29 DIAGNOSIS — S30861A Insect bite (nonvenomous) of abdominal wall, initial encounter: Secondary | ICD-10-CM | POA: Diagnosis not present

## 2023-12-29 DIAGNOSIS — W57XXXA Bitten or stung by nonvenomous insect and other nonvenomous arthropods, initial encounter: Secondary | ICD-10-CM | POA: Diagnosis not present

## 2024-01-04 DIAGNOSIS — M9904 Segmental and somatic dysfunction of sacral region: Secondary | ICD-10-CM | POA: Diagnosis not present

## 2024-01-04 DIAGNOSIS — M9905 Segmental and somatic dysfunction of pelvic region: Secondary | ICD-10-CM | POA: Diagnosis not present

## 2024-01-04 DIAGNOSIS — M9901 Segmental and somatic dysfunction of cervical region: Secondary | ICD-10-CM | POA: Diagnosis not present

## 2024-01-04 DIAGNOSIS — M9903 Segmental and somatic dysfunction of lumbar region: Secondary | ICD-10-CM | POA: Diagnosis not present

## 2024-01-05 ENCOUNTER — Other Ambulatory Visit (HOSPITAL_BASED_OUTPATIENT_CLINIC_OR_DEPARTMENT_OTHER): Payer: Self-pay

## 2024-01-05 ENCOUNTER — Ambulatory Visit (INDEPENDENT_AMBULATORY_CARE_PROVIDER_SITE_OTHER): Admitting: Student

## 2024-01-05 ENCOUNTER — Ambulatory Visit (HOSPITAL_BASED_OUTPATIENT_CLINIC_OR_DEPARTMENT_OTHER)

## 2024-01-05 ENCOUNTER — Telehealth (HOSPITAL_BASED_OUTPATIENT_CLINIC_OR_DEPARTMENT_OTHER): Payer: Self-pay

## 2024-01-05 ENCOUNTER — Encounter (HOSPITAL_BASED_OUTPATIENT_CLINIC_OR_DEPARTMENT_OTHER): Payer: Self-pay | Admitting: Student

## 2024-01-05 DIAGNOSIS — M5030 Other cervical disc degeneration, unspecified cervical region: Secondary | ICD-10-CM | POA: Diagnosis not present

## 2024-01-05 DIAGNOSIS — M542 Cervicalgia: Secondary | ICD-10-CM | POA: Diagnosis not present

## 2024-01-05 DIAGNOSIS — M4802 Spinal stenosis, cervical region: Secondary | ICD-10-CM | POA: Diagnosis not present

## 2024-01-05 DIAGNOSIS — M19012 Primary osteoarthritis, left shoulder: Secondary | ICD-10-CM | POA: Diagnosis not present

## 2024-01-05 DIAGNOSIS — M778 Other enthesopathies, not elsewhere classified: Secondary | ICD-10-CM | POA: Diagnosis not present

## 2024-01-05 DIAGNOSIS — M25512 Pain in left shoulder: Secondary | ICD-10-CM

## 2024-01-05 DIAGNOSIS — M4319 Spondylolisthesis, multiple sites in spine: Secondary | ICD-10-CM | POA: Diagnosis not present

## 2024-01-05 MED ORDER — METHYLPREDNISOLONE 4 MG PO TBPK
ORAL_TABLET | ORAL | 0 refills | Status: DC
  Filled 2024-01-05: qty 21, 6d supply, fill #0

## 2024-01-05 NOTE — Telephone Encounter (Signed)
 FYI pt called and wanted to tell you thank you. The dose pack is already helping her pain.

## 2024-01-05 NOTE — Progress Notes (Signed)
 Chief Complaint: Left shoulder pain    Discussed the use of AI scribe software for clinical note transcription with the patient, who gave verbal consent to proceed.  History of Present Illness  Michele Monroe is a 78 year old female who presents with left shoulder pain and neck stiffness. She has experienced left shoulder pain and neck stiffness since before Easter, worsening after a beach trip. The pain is constant, throbbing, and extends from the shoulder past the elbow. Neck stiffness limits her ability to turn her head to the left, impacting her driving. Ibuprofen and topical pain relievers provide only temporary relief, and the pain disrupts her sleep.  Her history includes right shoulder issues with previous rotator cuff surgery and cortisone injections, which were beneficial. A neck MRI was performed in 2018 which demonstrated multilevel spondylolisthesis, severe facet arthropathy, mild C5-C7 spinal stenosis, and severe neural foraminal stenosis at C6 and C7. She has received chiropractic adjustments for neck and back issues over the past three years. She has not undergone neck surgery or injections. She has been working in an office for over thirty years, which she believes contributes to her neck and shoulder issues. She reports no recent headaches and no significant weakness or numbness in the arm.  Surgical History:   None  PMH/PSH/Family History/Social History/Meds/Allergies:    Past Medical History:  Diagnosis Date   Adenomatous colon polyp    ALLERGIC RHINITIS 06/30/2010   perennial   Allergy    seasonal allergies   Anxiety    hx of   Arthritis    bilateral hands (R>L)   Coughing up blood 08/13/2012   piece of tissue? will send to path poss from upper airway sinusitis  Final path was "abscess"    Depression    hx of   GERD 06/30/2010   on meds   Headache(784.0) 06/30/2010   migraines   Hepatic steatosis    HYPERLIPIDEMIA 06/30/2010    on meds   HYPERTENSION 06/30/2010   on meds   IBS 06/30/2010   Internal hemorrhoids    Renal lesion    Varicose veins    VITAMIN D  DEFICIENCY 06/30/2010   Past Surgical History:  Procedure Laterality Date   BREAST CYST ASPIRATION     BREAST SURGERY     needle bx left breast   COLONOSCOPY  2016   KN-prep good-TA   feet     hammer toe sx   POLYPECTOMY  2016   TA   ROTATOR CUFF REPAIR Right    varicose veins     Social History   Socioeconomic History   Marital status: Widowed    Spouse name: Not on file   Number of children: 2   Years of education: Not on file   Highest education level: 12th grade  Occupational History   Occupation: retired    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  Tobacco Use   Smoking status: Former    Current packs/day: 0.00    Types: Cigarettes    Quit date: 09/06/1976    Years since quitting: 47.3   Smokeless tobacco: Never  Vaping Use   Vaping status: Never Used  Substance and Sexual Activity   Alcohol use: No    Alcohol/week: 0.0 standard drinks of alcohol    Comment: rarely   Drug use: No  Sexual activity: Not Currently  Other Topics Concern   Not on file  Social History Narrative   Widower  Husband died suddently  PTSD in his sleep   Former smoker in her 49's   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       Lives in condo      3 cups of caffinated coffee per day. Denies soda/tea   Right handed   Social Drivers of Health   Financial Resource Strain: Medium Risk (01/26/2023)   Overall Financial Resource Strain (CARDIA)    Difficulty of Paying Living Expenses: Somewhat hard  Food Insecurity: Food Insecurity Present (01/28/2023)   Hunger Vital Sign    Worried About Running Out of Food in the Last Year: Sometimes true    Ran Out of Food in the Last Year: Sometimes true  Transportation Needs: No Transportation Needs (01/28/2023)   PRAPARE - Scientist, research (physical sciences) (Medical): No    Lack of Transportation (Non-Medical): No  Physical Activity: Insufficiently Active (01/26/2023)   Exercise Vital Sign    Days of Exercise per Week: 5 days    Minutes of Exercise per Session: 10 min  Stress: Stress Concern Present (01/26/2023)   [employer] of Occupational Health - Occupational Stress Questionnaire    Feeling of Stress : To some extent  Social Connections: Unknown (01/26/2023)   Social Connection and Isolation Panel [NHANES]    Frequency of Communication with Friends and Family: More than three times a week    Frequency of Social Gatherings with Friends and Family: More than three times a week    Attends Religious Services: Patient declined    Database administrator or Organizations: No    Attends Banker Meetings: Never    Marital Status: Widowed  Recent Concern: Social Connections - Socially Isolated (01/14/2023)   Social Connection and Isolation Panel [NHANES]    Frequency of Communication with Friends and Family: More than three times a week    Frequency of Social Gatherings with Friends and Family: More than three times a week    Attends Religious Services: Never    Database administrator or Organizations: No    Attends Banker Meetings: Never    Marital Status: Widowed   Family History  Problem Relation Age of Onset   Colon cancer Mother        age 37   Colon polyps Mother    Breast cancer Cousin    Arthritis Brother    Other Brother    Colon polyps Brother    Esophageal cancer Neg Hx    Stomach cancer Neg Hx    Rectal cancer Neg Hx    No Known Allergies Current Outpatient Medications  Medication Sig Dispense Refill   albuterol  (VENTOLIN  HFA) 108 (90 Base) MCG/ACT inhaler Inhale 2 puffs by mouth every 6 hours as needed. LAST refill pt needs appt. (Patient not taking: Reported on 10/18/2023) 8.5 each 11   atenolol  (TENORMIN ) 25 MG tablet TAKE 1 TABLET BY MOUTH EVERY DAY 90 tablet 2    atorvastatin  (LIPITOR) 20 MG tablet Take 1 tablet (20 mg total) by mouth daily. 90 tablet 1   cetirizine  (ZYRTEC ) 10 MG chewable tablet Chew 10 mg by mouth daily.     fluticasone  (FLONASE ) 50 MCG/ACT nasal spray Place 1 spray into both nostrils  daily. 16 mL 2   fluticasone -salmeterol (ADVAIR DISKUS) 500-50 MCG/ACT AEPB Inhale 1 puff into the lungs in the morning and at bedtime. 60 each 3   furosemide  (LASIX ) 20 MG tablet Take 1 tablet (20 mg total) by mouth daily. 90 tablet 1   hyoscyamine  (LEVSIN SL) 0.125 MG SL tablet DISSOLVE 1 TABLET UNDER THE TONGUE EVERY MORNING AS NEEDED 90 tablet 1   ibuprofen (ADVIL) 200 MG tablet Take 400 mg by mouth every 6 (six) hours as needed for headache.     ipratropium-albuterol  (DUONEB) 0.5-2.5 (3) MG/3ML SOLN Take 3 mLs by nebulization every 6 (six) hours as needed (Shortness of breath/wheezing). 360 mL 0   omeprazole  (PRILOSEC) 20 MG capsule TAKE 1 CAPSULE BY MOUTH TWICE A DAY BEFORE A MEAL 180 capsule 0   PATADAY 0.1 % ophthalmic solution Place 1 drop into both eyes 2 (two) times daily as needed for allergies.     rizatriptan  (MAXALT ) 10 MG tablet Take 1 tablet (10 mg total) by mouth as needed for migraine (May repeat after 2 hours.  Maximum 2 tablets in 24 hours.). May repeat in 2 hours if needed 11 tablet 5   tiZANidine  (ZANAFLEX ) 4 MG tablet TAKE 1/2 TO 1 TABLET BY MOUTH AT BEDTIME AS NEEDED FOR NECK PAIN. 90 tablet 1   Ubrogepant  (UBRELVY ) 100 MG TABS Take 1 tablet (100 mg total) by mouth as needed (take 1 tab at the earlist onset of a Migraine. May repeat in 2 hours. Max 2 tab in 24 hours). 8 tablet 5   No current facility-administered medications for this visit.   No results found.  Review of Systems:   A ROS was performed including pertinent positives and negatives as documented in the HPI.  Physical Exam :   Constitutional: NAD and appears stated age Neurological: Alert and oriented Psych: Appropriate affect and cooperative There were no vitals  taken for this visit.   Comprehensive Musculoskeletal Exam:    No significant midline tenderness of the cervical spine.  Tenderness, trigger points, and increased tension palpable throughout the musculature of the left cervical and upper back regions.  Cervical ROM significantly limited with rotation to the left.  Positive Spurling's.  Active shoulder ROM bilaterally to 160 degrees forward flexion, 30 degrees ER, and IR to L4.  Negative empty can and belly press test.  Imaging:   Xray (cervical spine 4 views): Multilevel degenerative changes with diffuse osteophytes and facet arthropathy.  Decreased disc spacing most notable at C6-C7.  Mild spondylolisthesis at the C4-C5 and C7-T1 levels.  Xray (left shoulder 3 views): No notable bony abnormalities   I personally reviewed and interpreted the radiographs.      Assessment & Plan Left shoulder pain   Chronic pain worsens with driving, radiating from the neck past the elbow. X-rays show bony abnormalities of the shoulder with a likely cervical origin considered.  Full ROM and strength of the shoulder on exam today.  Prescribed an oral steroid taper pack. Referred to physical therapy for dry needling of the neck and upper back. Advised on range of motion and mobility exercises.  Cervical spondylosis   Degenerative changes cause stiffness, pain, and limited neck mobility, with possible radiation to the left shoulder. X-rays reveal diffuse degenerative changes, consistent with those found on MRI in 2018. Prescribed an oral steroid taper pack and monitor for relief.  May consider ESI in the future if indicated.     I personally saw and evaluated the patient, and participated in  the management and treatment plan.  Sharrell Deck, PA-C Orthopedics

## 2024-01-06 ENCOUNTER — Telehealth (HOSPITAL_BASED_OUTPATIENT_CLINIC_OR_DEPARTMENT_OTHER): Payer: Self-pay

## 2024-01-06 NOTE — Telephone Encounter (Signed)
 Michele Monroe spoke to pt today. She was complaining of being flushed after taking prednisone . Michele Monroe advised pt this is normal

## 2024-01-09 ENCOUNTER — Telehealth (HOSPITAL_BASED_OUTPATIENT_CLINIC_OR_DEPARTMENT_OTHER): Payer: Self-pay

## 2024-01-09 NOTE — Telephone Encounter (Signed)
 Pt called and stated her pain is returning now that she only has 1 days left of the steroid. She would like you to call her after 10 tomorrow to discuss this

## 2024-01-10 ENCOUNTER — Other Ambulatory Visit (HOSPITAL_BASED_OUTPATIENT_CLINIC_OR_DEPARTMENT_OTHER): Payer: Self-pay | Admitting: Student

## 2024-01-10 MED ORDER — MELOXICAM 15 MG PO TABS
15.0000 mg | ORAL_TABLET | Freq: Every day | ORAL | 0 refills | Status: AC
Start: 2024-01-10 — End: 2024-01-24

## 2024-01-10 NOTE — Telephone Encounter (Signed)
 Spoke to patient this afternoon. Sent Meloxicam  in to pharmacy. She is set to begin PT this week.

## 2024-01-11 ENCOUNTER — Encounter (HOSPITAL_COMMUNITY): Payer: Self-pay

## 2024-01-11 DIAGNOSIS — M9905 Segmental and somatic dysfunction of pelvic region: Secondary | ICD-10-CM | POA: Diagnosis not present

## 2024-01-11 DIAGNOSIS — M9904 Segmental and somatic dysfunction of sacral region: Secondary | ICD-10-CM | POA: Diagnosis not present

## 2024-01-11 DIAGNOSIS — M9903 Segmental and somatic dysfunction of lumbar region: Secondary | ICD-10-CM | POA: Diagnosis not present

## 2024-01-11 DIAGNOSIS — M9901 Segmental and somatic dysfunction of cervical region: Secondary | ICD-10-CM | POA: Diagnosis not present

## 2024-01-13 ENCOUNTER — Encounter: Payer: Self-pay | Admitting: Physical Therapy

## 2024-01-13 ENCOUNTER — Ambulatory Visit: Attending: Student | Admitting: Physical Therapy

## 2024-01-13 ENCOUNTER — Other Ambulatory Visit: Payer: Self-pay

## 2024-01-13 DIAGNOSIS — M25512 Pain in left shoulder: Secondary | ICD-10-CM | POA: Diagnosis not present

## 2024-01-13 DIAGNOSIS — R293 Abnormal posture: Secondary | ICD-10-CM | POA: Diagnosis not present

## 2024-01-13 DIAGNOSIS — R252 Cramp and spasm: Secondary | ICD-10-CM | POA: Diagnosis not present

## 2024-01-13 DIAGNOSIS — M542 Cervicalgia: Secondary | ICD-10-CM | POA: Diagnosis not present

## 2024-01-13 NOTE — Patient Instructions (Signed)

## 2024-01-13 NOTE — Therapy (Signed)
 OUTPATIENT PHYSICAL THERAPY CERVICAL EVALUATION   Patient Name: Michele Monroe MRN: [id number] DOB:March 14, 1946, 78 y.o., female Today's Date: 01/13/2024  END OF SESSION:  PT End of Session - 01/13/24 1133     Visit Number 1    Date for PT Re-Evaluation 03/12/24    Authorization Type [employer] MCR - submitting auth    Progress Note Due on Visit 10    PT Start Time 0845    PT Stop Time 0930    PT Time Calculation (min) 45 min    Activity Tolerance Patient tolerated treatment well    Behavior During Therapy [facility] for tasks assessed/performed             Past Medical History:  Diagnosis Date   Adenomatous colon polyp    ALLERGIC RHINITIS 06/30/2010   perennial   Allergy    seasonal allergies   Anxiety    hx of   Arthritis    bilateral hands (R>L)   Coughing up blood 08/13/2012   piece of tissue? will send to path poss from upper airway sinusitis  Final path was "abscess"    Depression    hx of   GERD 06/30/2010   on meds   Headache(784.0) 06/30/2010   migraines   Hepatic steatosis    HYPERLIPIDEMIA 06/30/2010   on meds   HYPERTENSION 06/30/2010   on meds   IBS 06/30/2010   Internal hemorrhoids    Renal lesion    Varicose veins    VITAMIN D  DEFICIENCY 06/30/2010   Past Surgical History:  Procedure Laterality Date   BREAST CYST ASPIRATION     BREAST SURGERY     needle bx left breast   COLONOSCOPY  2016   KN-prep good-TA   feet     hammer toe sx   POLYPECTOMY  2016   TA   ROTATOR CUFF REPAIR Right    varicose veins     Patient Active Problem List   Diagnosis Date Noted   Bronchitis 01/27/2023   Medication management 05/12/2022   Anticoagulant long-term use 05/12/2022   History of DVT of lower extremity 05/12/2022   Dizziness 05/05/2019   Varicose veins with pain 03/06/2015   Renal lesion 08/10/2014   Recurrent headache 08/10/2014   Hyperlipidemia 08/10/2014   Essential hypertension 08/10/2014   Neck pain on left side 02/08/2014   Allergic rhinitis ?  02/08/2014   Varicose veins of bilateral lower extremities with other complications 09/13/2013   Vasomotor flushing 06/19/2013   Varicose veins 08/13/2012   Mild anemia 08/13/2012   Recurrent acute sinusitis 08/13/2012   Bilateral neck pain 08/13/2012   Stress headaches 08/13/2012   Coughing up blood 08/13/2012   Constipation 07/25/2012   Subcutaneous nodule 10/06/2011   VITAMIN D  DEFICIENCY 06/30/2010   HYPERLIPIDEMIA 06/30/2010   HYPERTENSION 06/30/2010   ALLERGIC RHINITIS 06/30/2010   GERD 06/30/2010   IBS 06/30/2010   Headache 06/30/2010    PCP: Daphine Eagle, MD  REFERRING PROVIDER: Amedeo Jupiter, PA-C  REFERRING DIAG: M54.2 (ICD-10-CM) - Cervicalgia M25.512 (ICD-10-CM) - Acute pain of left shoulder  THERAPY DIAG:  Acute pain of left shoulder  Cervicalgia  Cramp and spasm  Abnormal posture  Rationale for Evaluation and Treatment: Rehabilitation  ONSET DATE: 1-2 mos  SUBJECTIVE:  SUBJECTIVE STATEMENT: Pain in neck started going into Lt shoulder - I've had neck pain before but never pain into Lt shoulder.  Started in April before Easter.  I had my head and Lt arm in a funny position coming back from the beach in the back seat with my grandkids which may have started it.  Couldn't raise Lt arm or turn head to Lt.  Oral steroid pack completed and Meloxicam  started yesterday and will take 1x/day for 14 days which has helped a lot. I am mostly painfree with the meds and moving much better. I have a knot in my neck and the doctor said he'd like to see that loosen up.  My doctor said I need DN but I don't know what that is.    Hand dominance: Right  PERTINENT HISTORY:  Right shoulder previous rotator cuff surgery  Mild lower cervical spinal stenosis, severe C6 and C7  foraminal stenosis, multi-level spondylolisthesis and severe facet arthropathy - 2018 MRI  PAIN:  PAIN:  Are you having pain? No b/c meds have helped so much NPRS scale: 0/10 but before meds was a 10/10 Pain location: Lt shoulder from neck to above elbow when pain was present Pain orientation: Left  PAIN TYPE: sharp and couldn't move Pain description: was constant but now much better  Aggravating factors: turning head to Lt and use Lt arm, trying to drive - now better Relieving factors: meds have helps pain altogether   PRECAUTIONS: None  RED FLAGS: None     WEIGHT BEARING RESTRICTIONS: No  FALLS:  Has patient fallen in last 6 months? 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  DIAGNOSTIC FINDINGS:  neck MRI 2018: demonstrated multilevel spondylolisthesis, severe facet arthropathy, mild C5-C7 spinal stenosis, and severe neural foraminal stenosis at C6 and C7  Neck and Lt shoulder xrays 01/05/2024: IMPRESSION: Mild acromioclavicular and glenohumeral osteoarthritis. IMPRESSION: 1. Moderate multifocal degenerative disc disease and facet hypertrophy. 2. Grade 1 anterolisthesis of C3 on C4 of 1-2 mm is unchanged on extension, 3 mm on flexion. 3. Grade 1 anterolisthesis of C4 on C5 and C7 on T1 is unchanged on flexion and extension   PATIENT SURVEYS:  NDI 20/50 = 40% limited  COGNITION: Overall cognitive status: Within functional limits for tasks assessed  SENSATION: WFL  POSTURE: rounded shoulders and forward head  PALPATION: Elevated first rib on Lt Poor U-joint sideglides Lt>Rt Poor upglides bil facets  mid and lower c-spine Signif tightness with TP present along Lt cervical parapsinals, upper trap, SO   CERVICAL ROM:   Active ROM A/PROM (deg) eval  Flexion 60 no pain  Extension 35, pain at base of skull on Lt  Right lateral flexion 30 no pain  Left lateral flexion 35 no pain  Right rotation 65 no pain  Left rotation 50 no pain   (Blank rows = not tested)  UPPER EXTREMITY ROM: Lt UE full ROM and painfree Slight end range tightness on Rt asymptomatic shoulder - has Hx of RC repair there  UPPER EXTREMITY MMT: Grossly 4+/5 bil no pain on testing  CERVICAL SPECIAL TESTS:  Spurling's test: Positive   TREATMENT DATE:  01/13/24: Discussion of  findings and plan of care Dry needling handout given HEP initiated                                                                                                                              PATIENT EDUCATION:  Education details: [id number] Person educated: Patient Education method: Explanation, Demonstration, Verbal cues, and Handouts Education comprehension: verbalized understanding and returned demonstration  HOME EXERCISE PROGRAM: Access Code: [email or web address] URL: [email or web address] Date: 01/13/2024 Prepared by: Minor Amble Lisle Skillman  Exercises - Supine Cervical Retraction with Towel  - 2 x daily - 7 x weekly - 1 sets - 5 reps - 10 hold - Supine Cervical Rotation AROM on Pillow  - 2 x daily - 7 x weekly - 1 sets - 3 reps - 10 hold - Seated Scapular Retraction  - 2 x daily - 7 x weekly - 1 sets - 10 reps - 5 hold - Seated Assisted Cervical Rotation with Towel  - 2 x daily - 7 x weekly - 1 sets - 3 reps - 10 hold  ASSESSMENT:  CLINICAL IMPRESSION: Patient is a 78 y.o. female who was seen today for physical therapy evaluation and treatment for neck and Lt shoulder pain.  Pain was severe at onset in April after holding challenging postures around grandkids in backseat while traveling home from beach.  Initially Pt couldn't  turn head to Lt or raise Lt arm.  She has history of signif degeneration and stenosis of neck and some OA of shoulder.  She arrives to evaluation today painfree having taken steroid pack.  She is now on 14 days of Meloxicam  which she started yesterday.  She presents with faulty posture, limited ROM, hypomobile joints of neck and tightness with trigger points present along Lt cervical and shoulder soft tissues.  Goals are to be able to turn head to check blind spot, loosen neck, and return to walking 2 small dogs in neighborhood.  She will benefit from skilled PT to address findings and reach goals.  OBJECTIVE IMPAIRMENTS: decreased mobility, decreased ROM, hypomobility, increased muscle spasms, impaired flexibility, improper body mechanics, postural dysfunction, and pain.   ACTIVITY LIMITATIONS: carrying, lifting, sleeping, and reach over head  PARTICIPATION LIMITATIONS: cleaning, driving, and community activity  PERSONAL FACTORS: Time since onset of injury/illness/exacerbation and 1 comorbidity: Hx of Rt RC repair are also affecting patient's functional outcome.   REHAB POTENTIAL: Excellent  CLINICAL DECISION MAKING: Stable/uncomplicated  EVALUATION COMPLEXITY: Low   GOALS: Goals reviewed with patient? Yes  SHORT TERM GOALS: Target date: 02/10/24  Pt will be ind with initial HEP and symptom management. Baseline:  Goal status: INITIAL  2.  Pt will improve Lt neck rotation to at least 55 to improve visibility with driving Baseline: 50 Goal status: INITIAL    LONG TERM GOALS: Target date: 03/12/24  Pt will be ind with advanced HEP Baseline:  Goal status: INITIAL  2.  Pt will improve NDI to </= 10/50 to demo reduced  limitation. Baseline: 20/50, 40% limited Goal status: INITIAL  3.  Pt will improve Lt rotation to at least 60 deg without pain to improve visibility with driving. Baseline: 50 deg Goal status: INITIAL  4.  Pt will demo proper posture and body mechanics using neck  retraction and scapular stabilizers with bending. lifting and carrying to enable  her to perform household chores without undue strain on degenerative spine Baseline:  Goal status: INITIAL  5.  Pt will return to walking dogs in the neighborhood without Lt UE arm or neck pain Baseline:  Goal status: INITIAL     PLAN:  PT FREQUENCY: 1-2x/week  PT DURATION: 8 weeks  PLANNED INTERVENTIONS: 97110-Therapeutic exercises, 97530- Therapeutic activity, 97112- Neuromuscular re-education, 97535- Self Care, 84132- Manual therapy, G0283- Electrical stimulation (unattended), 97012- Traction (mechanical), [id number]- Ionotophoresis 4mg /ml Dexamethasone , Patient/Family education, Taping, Dry Needling, Joint mobilization, Spinal mobilization, Cryotherapy, and Moist heat  PLAN FOR NEXT SESSION: DN neck and Lt shoulder, neck mobs, review HEP and progress postural stabilization   Taneeka Curtner, PT 01/13/24 11:34 AM

## 2024-01-16 ENCOUNTER — Ambulatory Visit: Admitting: Physical Therapy

## 2024-01-16 ENCOUNTER — Encounter: Payer: Self-pay | Admitting: Physical Therapy

## 2024-01-16 DIAGNOSIS — R293 Abnormal posture: Secondary | ICD-10-CM | POA: Diagnosis not present

## 2024-01-16 DIAGNOSIS — M542 Cervicalgia: Secondary | ICD-10-CM | POA: Diagnosis not present

## 2024-01-16 DIAGNOSIS — M25512 Pain in left shoulder: Secondary | ICD-10-CM | POA: Diagnosis not present

## 2024-01-16 DIAGNOSIS — R252 Cramp and spasm: Secondary | ICD-10-CM | POA: Diagnosis not present

## 2024-01-16 NOTE — Therapy (Signed)
 OUTPATIENT PHYSICAL THERAPY CERVICAL TREATMENT   Patient Name: Michele Monroe MRN: [id number] DOB:1946/07/12, 78 y.o., female Today's Date: 01/16/2024  END OF SESSION:  PT End of Session - 01/16/24 0949     Visit Number 2    Date for PT Re-Evaluation 03/12/24    Authorization Type [employer] M[employer] - submitting auth    Progress Note Due on Visit 10    PT Start Time 0849    PT Stop Time 0934    PT Time Calculation (min) 45 min    Activity Tolerance Patient tolerated treatment well    Behavior During Therapy [facility] for tasks assessed/performed              Past Medical History:  Diagnosis Date   Adenomatous colon polyp    ALLERGIC RHINITIS 06/30/2010   perennial   Allergy    seasonal allergies   Anxiety    hx of   Arthritis    bilateral hands (R>L)   Coughing up blood 08/13/2012   piece of tissue? will send to path poss from upper airway sinusitis  Final path was "abscess"    Depression    hx of   GERD 06/30/2010   on meds   Headache(784.0) 06/30/2010   migraines   Hepatic steatosis    HYPERLIPIDEMIA 06/30/2010   on meds   HYPERTENSION 06/30/2010   on meds   IBS 06/30/2010   Internal hemorrhoids    Renal lesion    Varicose veins    VITAMIN D  DEFICIENCY 06/30/2010   Past Surgical History:  Procedure Laterality Date   BREAST CYST ASPIRATION     BREAST SURGERY     needle bx left breast   COLONOSCOPY  2016   KN-prep good-TA   feet     hammer toe sx   POLYPECTOMY  2016   TA   ROTATOR CUFF REPAIR Right    varicose veins     Patient Active Problem List   Diagnosis Date Noted   Bronchitis 01/27/2023   Medication management 05/12/2022   Anticoagulant long-term use 05/12/2022   History of DVT of lower extremity 05/12/2022   Dizziness 05/05/2019   Varicose veins with pain 03/06/2015   Renal lesion 08/10/2014   Recurrent headache 08/10/2014   Hyperlipidemia 08/10/2014   Essential hypertension 08/10/2014   Neck pain on left side 02/08/2014   Allergic rhinitis  ? 02/08/2014   Varicose veins of bilateral lower extremities with other complications 09/13/2013   Vasomotor flushing 06/19/2013   Varicose veins 08/13/2012   Mild anemia 08/13/2012   Recurrent acute sinusitis 08/13/2012   Bilateral neck pain 08/13/2012   Stress headaches 08/13/2012   Coughing up blood 08/13/2012   Constipation 07/25/2012   Subcutaneous nodule 10/06/2011   VITAMIN D  DEFICIENCY 06/30/2010   HYPERLIPIDEMIA 06/30/2010   HYPERTENSION 06/30/2010   ALLERGIC RHINITIS 06/30/2010   GERD 06/30/2010   IBS 06/30/2010   Headache 06/30/2010    PCP: Daphine Eagle, MD  REFERRING PROVIDER: Amedeo Jupiter, PA-C  REFERRING DIAG: M54.2 (ICD-10-CM) - Cervicalgia M25.512 (ICD-10-CM) - Acute pain of left shoulder  THERAPY DIAG:  Acute pain of left shoulder  Cervicalgia  Cramp and spasm  Abnormal posture  Rationale for Evaluation and Treatment: Rehabilitation  ONSET DATE: 1-2 mos  SUBJECTIVE:  SUBJECTIVE STATEMENT: Patient reports her left shoulder is really bothering her today. Pain is currently 4/10. She has not had a chance to do the exercises due to it being a busy weekend.  From Eval: Pain in neck started going into Lt shoulder - I've had neck pain before but never pain into Lt shoulder.  Started in April before Easter.  I had my head and Lt arm in a funny position coming back from the beach in the back seat with my grandkids which may have started it.  Couldn't raise Lt arm or turn head to Lt.  Oral steroid pack completed and Meloxicam  started yesterday and will take 1x/day for 14 days which has helped a lot. I am mostly painfree with the meds and moving much better. I have a knot in my neck and the doctor said he'd like to see that loosen up.  My doctor said I need DN but  I don't know what that is.    Hand dominance: Right  PERTINENT HISTORY:  Right shoulder previous rotator cuff surgery  Mild lower cervical spinal stenosis, severe C6 and C7 foraminal stenosis, multi-level spondylolisthesis and severe facet arthropathy - 2018 MRI  PAIN:  PAIN:  Are you having pain? No b/c meds have helped so much NPRS scale: 0/10 but before meds was a 10/10 Pain location: Lt shoulder from neck to above elbow when pain was present Pain orientation: Left  PAIN TYPE: sharp and couldn't move Pain description: was constant but now much better  Aggravating factors: turning head to Lt and use Lt arm, trying to drive - now better Relieving factors: meds have helps pain altogether   PRECAUTIONS: None  RED FLAGS: None     WEIGHT BEARING RESTRICTIONS: No  FALLS:  Has patient fallen in last 6 months? 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  DIAGNOSTIC FINDINGS:  neck MRI 2018: demonstrated multilevel spondylolisthesis, severe facet arthropathy, mild C5-C7 spinal stenosis, and severe neural foraminal stenosis at C6 and C7  Neck and Lt shoulder xrays 01/05/2024: IMPRESSION: Mild acromioclavicular and glenohumeral osteoarthritis. IMPRESSION: 1. Moderate multifocal degenerative disc disease and facet hypertrophy. 2. Grade 1 anterolisthesis of C3 on C4 of 1-2 mm is unchanged on extension, 3 mm on flexion. 3. Grade 1 anterolisthesis of C4 on C5 and C7 on T1 is unchanged on flexion and extension   PATIENT SURVEYS:  NDI 20/50 = 40% limited  COGNITION: Overall cognitive status: Within functional limits for  tasks assessed  SENSATION: WFL  POSTURE: rounded shoulders and forward head  PALPATION: Elevated first rib on Lt Poor U-joint sideglides Lt>Rt Poor upglides bil facets mid and lower c-spine Signif tightness with TP present along Lt cervical parapsinals, upper trap, SO   CERVICAL ROM:   Active ROM A/PROM (deg) eval  Flexion 60 no pain  Extension 35, pain at base of skull on Lt  Right lateral flexion 30 no pain  Left lateral flexion 35 no pain  Right rotation 65 no pain  Left rotation 50 no pain   (Blank rows = not tested)  UPPER EXTREMITY ROM: Lt UE full ROM and painfree Slight end range tightness on Rt asymptomatic shoulder -  has Hx of RC repair there  UPPER EXTREMITY MMT: Grossly 4+/5 bil no pain on testing  CERVICAL SPECIAL TESTS:  Spurling's test: Positive   TREATMENT DATE:  01/16/2024 UBE 3/3 6 mins- PT present to discus status (backwards started to hurt right arm so went back to forwards) Seated scap retraction x 10 5 sec hold Supine cervical retraction x 5 10 sec  Supine cervical rotation on pillow x 10 each direction Seated assisted cervical rotation with towel x 10 each direction  Trigger Point Dry Needling  Initial Treatment: Pt instructed on Dry Needling rational, procedures, and possible side effects. Pt instructed to expect mild to moderate muscle soreness later in the day and/or into the next day.  Pt instructed in methods to reduce muscle soreness. Pt instructed to continue prescribed HEP.  Patient Verbal Consent Given: Yes Education Handout Provided: Previously Provided Muscles Treated: Lt cervical multifidi ; bilateral upper trap  Electrical Stimulation Performed: No Treatment Response/Outcome: Utilized skilled palpation to identify trigger points.  During dry needling able to palpate muscle twitch and muscle elongation   Manual STM performed after dry to muscles above  01/13/24: Discussion of findings and plan of care Dry needling handout  given HEP initiated                                                                                                                            PATIENT EDUCATION:  Education details: [id number] Person educated: Patient Education method: Explanation, Demonstration, Verbal cues, and Handouts Education comprehension: verbalized understanding and returned demonstration  HOME EXERCISE PROGRAM: Access Code: [email or web address] URL: [email or web address] Date: 01/13/2024 Prepared by: Minor Amble Beuhring  Exercises - Supine Cervical Retraction with Towel  - 2 x daily - 7 x weekly - 1 sets - 5 reps - 10 hold - Supine Cervical Rotation AROM on Pillow  - 2 x daily - 7 x weekly - 1 sets - 3 reps - 10 hold - Seated Scapular Retraction  - 2 x daily - 7 x weekly - 1 sets - 10 reps - 5 hold - Seated Assisted Cervical Rotation with Towel  - 2 x daily - 7 x weekly - 1 sets - 3 reps - 10 hold  ASSESSMENT:  CLINICAL IMPRESSION: Keran presents for 1st follow up appointment since evaluation. She has not had a chance to perform HEP exercises due to having a busy weekend. Reviewed exercises with patient and she required max verbal and visual cues for correct exercise performance. Dry needling was successful at eliciting muscle twitch and elongation. Educated patient on correct resting jaw position since she does have TMJ dysfunction. Patient will benefit from skilled PT to address the below impairments and improve overall function.   OBJECTIVE IMPAIRMENTS: decreased mobility, decreased ROM, hypomobility, increased muscle spasms, impaired flexibility, improper body mechanics, postural dysfunction, and pain.   ACTIVITY LIMITATIONS: carrying, lifting, sleeping, and reach over head  PARTICIPATION LIMITATIONS: cleaning, driving, and community activity  PERSONAL FACTORS: Time since onset  of injury/illness/exacerbation and 1 comorbidity: Hx of Rt RC repair are also affecting patient's functional outcome.   REHAB  POTENTIAL: Excellent  CLINICAL DECISION MAKING: Stable/uncomplicated  EVALUATION COMPLEXITY: Low   GOALS: Goals reviewed with patient? Yes  SHORT TERM GOALS: Target date: 02/10/24  Pt will be ind with initial HEP and symptom management. Baseline:  Goal status: INITIAL  2.  Pt will improve Lt neck rotation to at least 55 to improve visibility with driving Baseline: 50 Goal status: INITIAL    LONG TERM GOALS: Target date: 03/12/24  Pt will be ind with advanced HEP Baseline:  Goal status: INITIAL  2.  Pt will improve NDI to </= 10/50 to demo reduced limitation. Baseline: 20/50, 40% limited Goal status: INITIAL  3.  Pt will improve Lt rotation to at least 60 deg without pain to improve visibility with driving. Baseline: 50 deg Goal status: INITIAL  4.  Pt will demo proper posture and body mechanics using neck retraction and scapular stabilizers with bending. lifting and carrying to enable  her to perform household chores without undue strain on degenerative spine Baseline:  Goal status: INITIAL  5.  Pt will return to walking dogs in the neighborhood without Lt UE arm or neck pain Baseline:  Goal status: INITIAL     PLAN:  PT FREQUENCY: 1-2x/week  PT DURATION: 8 weeks  PLANNED INTERVENTIONS: 97110-Therapeutic exercises, 97530- Therapeutic activity, 97112- Neuromuscular re-education, 97535- Self Care, 16109- Manual therapy, G0283- Electrical stimulation (unattended), [phone]- Traction (mechanical), 97033- Ionotophoresis 4mg /ml Dexamethasone , Patient/Family education, Taping, Dry Needling, Joint mobilization, Spinal mobilization, Cryotherapy, and Moist heat  PLAN FOR NEXT SESSION: assess response to DN  #1; postural strengthening; re- check resting jaw position; neck mobs, review HEP compliance   Penelope Bowie, PT 01/16/24 9:50 AM

## 2024-01-17 ENCOUNTER — Encounter: Payer: Self-pay | Admitting: Internal Medicine

## 2024-01-17 NOTE — Progress Notes (Unsigned)
 No chief complaint on file.   HPI: Patient  Michele Monroe  78 y.o. comes in today for Preventive Health Care visit   Health Maintenance  Topic Date Due   Zoster Vaccines- Shingrix (1 of 2) 06/07/1966   DTaP/Tdap/Td (2 - Tdap) 09/06/2017   Medicare Annual Wellness (AWV)  01/14/2024   Colonoscopy  04/06/2024   INFLUENZA VACCINE  04/06/2024   Pneumonia Vaccine 20+ Years old  Completed   DEXA SCAN  Completed   Hepatitis C Screening  Completed   HPV VACCINES  Aged Out   Meningococcal B Vaccine  Aged Out   COVID-19 Vaccine  Discontinued   Health Maintenance Review LIFESTYLE:  Exercise:   Tobacco/ETS: Alcohol:  Sugar beverages: Sleep: Drug use: no HH of  Work:    ROS:  GEN/ HEENT: No fever, significant weight changes sweats headaches vision problems hearing changes, CV/ PULM; No chest pain shortness of breath cough, syncope,edema  change in exercise tolerance. GI /GU: No adominal pain, vomiting, change in bowel habits. No blood in the stool. No significant GU symptoms. SKIN/HEME: ,no acute skin rashes suspicious lesions or bleeding. No lymphadenopathy, nodules, masses.  NEURO/ PSYCH:  No neurologic signs such as weakness numbness. No depression anxiety. IMM/ Allergy: No unusual infections.  Allergy .   REST of 12 system review negative except as per HPI   Past Medical History:  Diagnosis Date   Adenomatous colon polyp    ALLERGIC RHINITIS 06/30/2010   perennial   Allergy    seasonal allergies   Anticoagulant long-term use 05/12/2022   For rle prox dvt end feb 2023     Anxiety    hx of   Arthritis    bilateral hands (R>L)   Coughing up blood 08/13/2012   piece of tissue? will send to path poss from upper airway sinusitis  Final path was "abscess"    Depression    hx of   GERD 06/30/2010   on meds   Headache(784.0) 06/30/2010   migraines   Hepatic steatosis    HYPERLIPIDEMIA 06/30/2010   on meds   HYPERTENSION 06/30/2010   on meds   IBS 06/30/2010    Internal hemorrhoids    Renal lesion    Varicose veins    VITAMIN D  DEFICIENCY 06/30/2010    Past Surgical History:  Procedure Laterality Date   BREAST CYST ASPIRATION     BREAST SURGERY     needle bx left breast   COLONOSCOPY  2016   KN-prep good-TA   feet     hammer toe sx   POLYPECTOMY  2016   TA   ROTATOR CUFF REPAIR Right    varicose veins      Family History  Problem Relation Age of Onset   Colon cancer Mother        age 25   Colon polyps Mother    Breast cancer Cousin    Arthritis Brother    Other Brother    Colon polyps Brother    Esophageal cancer Neg Hx    Stomach cancer Neg Hx    Rectal cancer Neg Hx     Social History   Socioeconomic History   Marital status: Widowed    Spouse name: Not on file   Number of children: 2   Years of education: Not on file   Highest education level: 12th grade  Occupational History   Occupation: retired    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  Tobacco Use   Smoking status:  Former    Current packs/day: 0.00    Types: Cigarettes    Quit date: 09/06/1976    Years since quitting: 47.3   Smokeless tobacco: Never  Vaping Use   Vaping status: Never Used  Substance and Sexual Activity   Alcohol use: No    Alcohol/week: 0.0 standard drinks of alcohol    Comment: rarely   Drug use: No   Sexual activity: Not Currently  Other Topics Concern   Not on file  Social History Narrative   Widower  Husband died suddently  PTSD in his sleep   Former smoker in her 73's   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       Lives in condo      3 cups of caffinated coffee per day. Denies soda/tea   Right handed   Social Drivers of Health   Financial Resource Strain: Medium Risk (01/26/2023)   Overall Financial Resource Strain (CARDIA)    Difficulty of Paying Living Expenses: Somewhat hard  Food Insecurity: Food Insecurity Present (01/28/2023)   Hunger Vital Sign     Worried About Running Out of Food in the Last Year: Sometimes true    Ran Out of Food in the Last Year: Sometimes true  Transportation Needs: No Transportation Needs (01/28/2023)   PRAPARE - Administrator, Civil Service (Medical): No    Lack of Transportation (Non-Medical): No  Physical Activity: Insufficiently Active (01/26/2023)   Exercise Vital Sign    Days of Exercise per Week: 5 days    Minutes of Exercise per Session: 10 min  Stress: Stress Concern Present (01/26/2023)   [employer] of Occupational Health - Occupational Stress Questionnaire    Feeling of Stress : To some extent  Social Connections: Unknown (01/26/2023)   Social Connection and Isolation Panel [NHANES]    Frequency of Communication with Friends and Family: More than three times a week    Frequency of Social Gatherings with Friends and Family: More than three times a week    Attends Religious Services: Patient declined    Database administrator or Organizations: No    Attends Banker Meetings: Not on file    Marital Status: Widowed  Recent Concern: Social Connections - Socially Isolated (01/14/2023)   Social Connection and Isolation Panel [NHANES]    Frequency of Communication with Friends and Family: More than three times a week    Frequency of Social Gatherings with Friends and Family: More than three times a week    Attends Religious Services: Never    Database administrator or Organizations: No    Attends Banker Meetings: Never    Marital Status: Widowed    Outpatient Medications Prior to Visit  Medication Sig Dispense Refill   meloxicam  (MOBIC ) 15 MG tablet Take 1 tablet (15 mg total) by mouth daily for 14 days. 14 tablet 0   albuterol  (VENTOLIN  HFA) 108 (90 Base) MCG/ACT inhaler Inhale 2 puffs by mouth every 6 hours as needed. LAST refill pt needs appt. (Patient not taking: Reported on 10/18/2023) 8.5 each 11   atenolol  (TENORMIN ) 25 MG tablet TAKE 1 TABLET BY MOUTH  EVERY DAY 90 tablet 2   atorvastatin  (LIPITOR) 20 MG tablet Take 1 tablet (20 mg total) by mouth daily. 90 tablet 1   cetirizine  (ZYRTEC ) 10 MG chewable tablet Chew  10 mg by mouth daily.     fluticasone  (FLONASE ) 50 MCG/ACT nasal spray Place 1 spray into both nostrils daily. 16 mL 2   fluticasone -salmeterol (ADVAIR DISKUS) 500-50 MCG/ACT AEPB Inhale 1 puff into the lungs in the morning and at bedtime. 60 each 3   furosemide  (LASIX ) 20 MG tablet Take 1 tablet (20 mg total) by mouth daily. 90 tablet 1   hyoscyamine  (LEVSIN SL) 0.125 MG SL tablet DISSOLVE 1 TABLET UNDER THE TONGUE EVERY MORNING AS NEEDED 90 tablet 1   ibuprofen (ADVIL) 200 MG tablet Take 400 mg by mouth every 6 (six) hours as needed for headache.     ipratropium-albuterol  (DUONEB) 0.5-2.5 (3) MG/3ML SOLN Take 3 mLs by nebulization every 6 (six) hours as needed (Shortness of breath/wheezing). 360 mL 0   methylPREDNISolone  (MEDROL  DOSEPAK) 4 MG TBPK tablet Take per packet instructions 21 each 0   omeprazole  (PRILOSEC) 20 MG capsule TAKE 1 CAPSULE BY MOUTH TWICE A DAY BEFORE A MEAL 180 capsule 0   PATADAY 0.1 % ophthalmic solution Place 1 drop into both eyes 2 (two) times daily as needed for allergies.     rizatriptan  (MAXALT ) 10 MG tablet Take 1 tablet (10 mg total) by mouth as needed for migraine (May repeat after 2 hours.  Maximum 2 tablets in 24 hours.). May repeat in 2 hours if needed 11 tablet 5   tiZANidine  (ZANAFLEX ) 4 MG tablet TAKE 1/2 TO 1 TABLET BY MOUTH AT BEDTIME AS NEEDED FOR NECK PAIN. 90 tablet 1   Ubrogepant  (UBRELVY ) 100 MG TABS Take 1 tablet (100 mg total) by mouth as needed (take 1 tab at the earlist onset of a Migraine. May repeat in 2 hours. Max 2 tab in 24 hours). 8 tablet 5   No facility-administered medications prior to visit.     EXAM:  There were no vitals taken for this visit.  There is no height or weight on file to calculate BMI. Wt Readings from Last 3 Encounters:  10/18/23 176 lb 9.6 oz (80.1 kg)   07/18/23 175 lb (79.4 kg)  04/13/23 172 lb 12.8 oz (78.4 kg)    Physical Exam: Vital signs reviewed UJW:[id number] is a well-developed well-nourished alert cooperative    who appearsr stated age in no acute distress.  HEENT: normocephalic atraumatic , Eyes: PERRL EOM's full, conjunctiva clear, Nares: paten,t no deformity discharge or tenderness., Ears: no deformity EAC's clear TMs with normal landmarks. Mouth: clear OP, no lesions, edema.  Moist mucous membranes. Dentition in adequate repair. NECK: supple without masses, thyromegaly or bruits. CHEST/PULM:  Clear to auscultation and percussion breath sounds equal no wheeze , rales or rhonchi. No chest wall deformities or tenderness. Breast: normal by inspection . No dimpling, discharge, masses, tenderness or discharge . CV: PMI is nondisplaced, S1 S2 no gallops, murmurs, rubs. Peripheral pulses are full without delay.No JVD .  ABDOMEN: Bowel sounds normal nontender  No guard or rebound, no hepato splenomegal no CVA tenderness.   Extremtities:  No clubbing cyanosis or edema, no acute joint swelling or redness no focal atrophy NEURO:  Oriented x3, cranial nerves 3-12 appear to be intact, no obvious focal weakness,gait within normal limits no abnormal reflexes or asymmetrical SKIN: No acute rashes normal turgor, color, no bruising or petechiae. PSYCH: Oriented, good eye contact, no obvious depression anxiety, cognition and judgment appear normal. LN: no cervical axillary adenopathy  Lab Results  Component Value Date   WBC 10.5 01/28/2023   HGB 12.8 01/28/2023   HCT  38.5 01/28/2023   PLT 209 01/28/2023   GLUCOSE 103 (H) 01/28/2023   CHOL 162 04/08/2021   TRIG 185.0 (H) 04/08/2021   HDL 49.90 04/08/2021   LDLCALC 75 04/08/2021   ALT 13 12/21/2022   AST 21 12/21/2022   NA 140 01/28/2023   K 4.1 01/28/2023   CL 105 01/28/2023   CREATININE 0.74 01/28/2023   BUN 19 01/28/2023   CO2 26 01/28/2023   TSH 1.31 12/21/2022   HGBA1C 5.6 12/21/2022    MICROALBUR <0.7 12/21/2022    BP Readings from Last 3 Encounters:  10/18/23 (!) 143/78  07/18/23 134/76  04/13/23 118/82    Lab results reviewed with patient   ASSESSMENT AND PLAN:  Discussed the following assessment and plan:    ICD-10-CM   1. Visit for preventive health examination  Z00.00     2. Medication management  Z79.899     3. Essential hypertension  I10     4. Hyperlipidemia, unspecified hyperlipidemia type  E78.5      No follow-ups on file.  Patient Care Team: Roberto Hlavaty, Joaquim Muir, MD as PCP - Eduard Grad, Olive Better, DO as Consulting Physician (Neurology) Nandigam, Kavitha V, MD as Consulting Physician (Gastroenterology) There are no Patient Instructions on file for this visit.  Trentan Trippe K. Vanity Larsson M.D.

## 2024-01-18 ENCOUNTER — Ambulatory Visit (INDEPENDENT_AMBULATORY_CARE_PROVIDER_SITE_OTHER): Admitting: Internal Medicine

## 2024-01-18 ENCOUNTER — Encounter: Payer: Self-pay | Admitting: Internal Medicine

## 2024-01-18 ENCOUNTER — Ambulatory Visit: Admitting: Physical Therapy

## 2024-01-18 VITALS — BP 100/62 | HR 75 | Temp 97.6°F | Ht 64.8 in | Wt 177.4 lb

## 2024-01-18 DIAGNOSIS — M9901 Segmental and somatic dysfunction of cervical region: Secondary | ICD-10-CM | POA: Diagnosis not present

## 2024-01-18 DIAGNOSIS — Z79899 Other long term (current) drug therapy: Secondary | ICD-10-CM

## 2024-01-18 DIAGNOSIS — Z0000 Encounter for general adult medical examination without abnormal findings: Secondary | ICD-10-CM | POA: Diagnosis not present

## 2024-01-18 DIAGNOSIS — R252 Cramp and spasm: Secondary | ICD-10-CM | POA: Diagnosis not present

## 2024-01-18 DIAGNOSIS — I10 Essential (primary) hypertension: Secondary | ICD-10-CM

## 2024-01-18 DIAGNOSIS — M25512 Pain in left shoulder: Secondary | ICD-10-CM

## 2024-01-18 DIAGNOSIS — E785 Hyperlipidemia, unspecified: Secondary | ICD-10-CM | POA: Diagnosis not present

## 2024-01-18 DIAGNOSIS — M9905 Segmental and somatic dysfunction of pelvic region: Secondary | ICD-10-CM | POA: Diagnosis not present

## 2024-01-18 DIAGNOSIS — M9904 Segmental and somatic dysfunction of sacral region: Secondary | ICD-10-CM | POA: Diagnosis not present

## 2024-01-18 DIAGNOSIS — M542 Cervicalgia: Secondary | ICD-10-CM

## 2024-01-18 DIAGNOSIS — E559 Vitamin D deficiency, unspecified: Secondary | ICD-10-CM

## 2024-01-18 DIAGNOSIS — M9903 Segmental and somatic dysfunction of lumbar region: Secondary | ICD-10-CM | POA: Diagnosis not present

## 2024-01-18 DIAGNOSIS — R293 Abnormal posture: Secondary | ICD-10-CM | POA: Diagnosis not present

## 2024-01-18 MED ORDER — TIZANIDINE HCL 4 MG PO TABS: ORAL_TABLET | ORAL | 1 refills | Status: DC

## 2024-01-18 NOTE — Therapy (Signed)
 OUTPATIENT PHYSICAL THERAPY CERVICAL TREATMENT   Patient Name: Michele Monroe MRN: [id number] DOB:February 24, 1946, 78 y.o., female Today's Date: 01/18/2024  END OF SESSION:  PT End of Session - 01/18/24 0845     Visit Number 3    Number of Visits 16    Date for PT Re-Evaluation 03/12/24    Authorization Type [employer] MCR - 16 visits 5/9 to 7/7    Progress Note Due on Visit 10    PT Start Time 0845    PT Stop Time 0925    PT Time Calculation (min) 40 min    Activity Tolerance Patient tolerated treatment well              Past Medical History:  Diagnosis Date   Adenomatous colon polyp    ALLERGIC RHINITIS 06/30/2010   perennial   Allergy    seasonal allergies   Anticoagulant long-term use 05/12/2022   For rle prox dvt end feb 2023     Anxiety    hx of   Arthritis    bilateral hands (R>L)   Coughing up blood 08/13/2012   piece of tissue? will send to path poss from upper airway sinusitis  Final path was "abscess"    Depression    hx of   GERD 06/30/2010   on meds   Headache(784.0) 06/30/2010   migraines   Hepatic steatosis    HYPERLIPIDEMIA 06/30/2010   on meds   HYPERTENSION 06/30/2010   on meds   IBS 06/30/2010   Internal hemorrhoids    Renal lesion    Varicose veins    VITAMIN D  DEFICIENCY 06/30/2010   Past Surgical History:  Procedure Laterality Date   BREAST CYST ASPIRATION     BREAST SURGERY     needle bx left breast   COLONOSCOPY  2016   KN-prep good-TA   feet     hammer toe sx   POLYPECTOMY  2016   TA   ROTATOR CUFF REPAIR Right    varicose veins     Patient Active Problem List   Diagnosis Date Noted   Bronchitis 01/27/2023   Medication management 05/12/2022   History of DVT of lower extremity 05/12/2022   Dizziness 05/05/2019   Varicose veins with pain 03/06/2015   Renal lesion 08/10/2014   Recurrent headache 08/10/2014   Hyperlipidemia 08/10/2014   Essential hypertension 08/10/2014   Neck pain on left side 02/08/2014   Allergic  rhinitis ? 02/08/2014   Varicose veins of bilateral lower extremities with other complications 09/13/2013   Vasomotor flushing 06/19/2013   Varicose veins 08/13/2012   Mild anemia 08/13/2012   Recurrent acute sinusitis 08/13/2012   Bilateral neck pain 08/13/2012   Stress headaches 08/13/2012   Coughing up blood 08/13/2012   Constipation 07/25/2012   Subcutaneous nodule 10/06/2011   VITAMIN D  DEFICIENCY 06/30/2010   HYPERLIPIDEMIA 06/30/2010   HYPERTENSION 06/30/2010   ALLERGIC RHINITIS 06/30/2010   GERD 06/30/2010   IBS 06/30/2010   Headache 06/30/2010    PCP: Daphine Eagle, MD  REFERRING PROVIDER: Amedeo Jupiter, PA-C  REFERRING DIAG: M54.2 (ICD-10-CM) - Cervicalgia M25.512 (ICD-10-CM) - Acute pain of left shoulder  THERAPY DIAG:  Acute pain of left shoulder  Cervicalgia  Cramp and spasm  Rationale for Evaluation and Treatment: Rehabilitation  ONSET DATE: 1-2 mos  SUBJECTIVE:  SUBJECTIVE STATEMENT: Still knotty.  The DN was different. I think it released things that had been there 15 years but then it came back.  I haven't had a chance to do the exercises yet b/c I had to cook and had a lot of things going on.      From Eval: Pain in neck started going into Lt shoulder - I've had neck pain before but never pain into Lt shoulder.  Started in April before Easter.  I had my head and Lt arm in a funny position coming back from the beach in the back seat with my grandkids which may have started it.  Couldn't raise Lt arm or turn head to Lt.  Oral steroid pack completed and Meloxicam  started yesterday and will take 1x/day for 14 days which has helped a lot. I am mostly painfree with the meds and moving much better. I have a knot in my neck and the doctor said he'd like to see  that loosen up.  My doctor said I need DN but I don't know what that is.    Hand dominance: Right  PERTINENT HISTORY:  Right shoulder previous rotator cuff surgery  Mild lower cervical spinal stenosis, severe C6 and C7 foraminal stenosis, multi-level spondylolisthesis and severe facet arthropathy - 2018 MRI  PAIN:  PAIN:  Are you having pain? yes NPRS scale: 4/10 but hurts to hold steering wheel Pain location: Lt shoulder from neck to above elbow when pain was present Pain orientation: Left  PAIN TYPE: sharp and couldn't move Pain description: was constant but now much better  Aggravating factors: turning head to Lt and use Lt arm, trying to drive - now better Relieving factors: meds have helps pain altogether   PRECAUTIONS: None  RED FLAGS: None     WEIGHT BEARING RESTRICTIONS: No  FALLS:  Has patient fallen in last 6 months? 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  DIAGNOSTIC FINDINGS:  neck MRI 2018: demonstrated multilevel spondylolisthesis, severe facet arthropathy, mild C5-C7 spinal stenosis, and severe neural foraminal stenosis at C6 and C7  Neck and Lt shoulder xrays 01/05/2024: IMPRESSION: Mild acromioclavicular and glenohumeral osteoarthritis. IMPRESSION: 1. Moderate multifocal degenerative disc disease and facet hypertrophy. 2. Grade 1 anterolisthesis of C3 on C4 of 1-2 mm is unchanged on extension, 3 mm on flexion. 3. Grade 1 anterolisthesis of C4 on C5 and C7 on T1 is unchanged on flexion and extension   PATIENT SURVEYS:  NDI 20/50 = 40% limited  COGNITION: Overall cognitive status:  Within functional limits for tasks assessed  SENSATION: WFL  POSTURE: rounded shoulders and forward head  PALPATION: Elevated first rib on Lt Poor U-joint sideglides Lt>Rt Poor upglides bil facets mid and lower c-spine Signif tightness with TP present along Lt cervical parapsinals, upper trap, SO   CERVICAL ROM:   Active ROM A/PROM (deg) eval  Flexion 60 no pain  Extension 35, pain at base of skull on Lt  Right lateral flexion 30 no pain  Left lateral flexion 35 no pain  Right rotation 65 no pain  Left rotation 50 no pain   (Blank rows = not tested)  UPPER EXTREMITY ROM:  Lt UE full ROM and painfree Slight end range tightness on Rt asymptomatic shoulder - has Hx of RC repair there  UPPER EXTREMITY MMT: Grossly 4+/5 bil no pain on testing  CERVICAL SPECIAL TESTS:  Spurling's test: Positive   TREATMENT DATE:  01/18/2024 UBE 3 mins forward only- PT present to discuss status  Seated thoracic extension ball 2x 10 Seated upright row with red band anchored under feet 10x Standing upright row with red band 10x (added HEP) Standing bil elbow flexion with red band anchored under feet 10x Standing bil shoulder red band row 10x (added to HEP) Standing bil shoulder extensions with red band 10x (added to HEP) Pt given red band with handles for home use (she reports OA in her hands)    01/16/2024 UBE 3/3 6 mins- PT present to discus status (backwards started to hurt right arm so went back to forwards) Seated scap retraction x 10 5 sec hold Supine cervical retraction x 5 10 sec  Supine cervical rotation on pillow x 10 each direction Seated assisted cervical rotation with towel x 10 each direction   Trigger Point Dry Needling  Initial Treatment: Pt instructed on Dry Needling rational, procedures, and possible side effects. Pt instructed to expect mild to moderate muscle soreness later in the day and/or into the next day.  Pt instructed in methods to reduce muscle  soreness. Pt instructed to continue prescribed HEP.  Patient Verbal Consent Given: Yes Education Handout Provided: Previously Provided Muscles Treated: Lt cervical multifidi ; bilateral upper trap  Electrical Stimulation Performed: No Treatment Response/Outcome: Utilized skilled palpation to identify trigger points.  During dry needling able to palpate muscle twitch and muscle elongation   Manual STM performed after dry to muscles above  01/13/24: Discussion of findings and plan of care Dry needling handout given HEP initiated                                                                                                                            PATIENT EDUCATION:  Education details: [id number] Person educated: Patient Education method: Explanation, Demonstration, Verbal cues, and Handouts Education comprehension: verbalized understanding and returned demonstration  HOME EXERCISE PROGRAM: Access Code: [email or web address] URL: [email or web address] Date: 01/18/2024 Prepared by: Darien Eden  Exercises - Supine Cervical Retraction with Towel  - 2 x daily - 7 x weekly - 1 sets - 5 reps - 10 hold - Supine Cervical Rotation AROM on Pillow  - 2 x daily - 7 x weekly - 1 sets - 3 reps - 10 hold - Seated Scapular Retraction  - 2 x daily - 7 x weekly - 1 sets - 10 reps - 5 hold - Seated Assisted Cervical Rotation with Towel  - 2 x daily - 7 x weekly - 1 sets - 3 reps - 10 hold - Seated Upright Shoulder Row with Resistance Band with PLB  - 1 x daily - 7 x weekly - 1 sets - 10 reps - Standing  Bicep Curls with Resistance Band with PLB  - 1 x daily - 7 x weekly - 1 sets - 10 reps - Standing Shoulder Row with Anchored Resistance  - 1 x daily - 7 x weekly - 1 sets - 10 reps - Shoulder extension with resistance - Neutral  - 1 x daily - 7 x weekly - 1 sets - 10 reps  ASSESSMENT:  CLINICAL IMPRESSION: Encouraged consistent exercise for best long term outcomes and benefit from DN/manual  therapy.  She responds well to the addition of low level seated and standing resistance band ex's reporting the ex's relieved some of her muscle tension.  Therapist providing verbal cues to optimize technique with  exercises in order to achieve the greatest benefit and monitoring response to exercise.      OBJECTIVE IMPAIRMENTS: decreased mobility, decreased ROM, hypomobility, increased muscle spasms, impaired flexibility, improper body mechanics, postural dysfunction, and pain.   ACTIVITY LIMITATIONS: carrying, lifting, sleeping, and reach over head  PARTICIPATION LIMITATIONS: cleaning, driving, and community activity  PERSONAL FACTORS: Time since onset of injury/illness/exacerbation and 1 comorbidity: Hx of Rt RC repair are also affecting patient's functional outcome.   REHAB POTENTIAL: Excellent  CLINICAL DECISION MAKING: Stable/uncomplicated  EVALUATION COMPLEXITY: Low   GOALS: Goals reviewed with patient? Yes  SHORT TERM GOALS: Target date: 02/10/24  Pt will be ind with initial HEP and symptom management. Baseline:  Goal status: INITIAL  2.  Pt will improve Lt neck rotation to at least 55 to improve visibility with driving Baseline: 50 Goal status: INITIAL    LONG TERM GOALS: Target date: 03/12/24  Pt will be ind with advanced HEP Baseline:  Goal status: INITIAL  2.  Pt will improve NDI to </= 10/50 to demo reduced limitation. Baseline: 20/50, 40% limited Goal status: INITIAL  3.  Pt will improve Lt rotation to at least 60 deg without pain to improve visibility with driving. Baseline: 50 deg Goal status: INITIAL  4.  Pt will demo proper posture and body mechanics using neck retraction and scapular stabilizers with bending. lifting and carrying to enable  her to perform household chores without undue strain on degenerative spine Baseline:  Goal status: INITIAL  5.  Pt will return to walking dogs in the neighborhood without Lt UE arm or neck pain Baseline:  Goal  status: INITIAL     PLAN:  PT FREQUENCY: 1-2x/week  PT DURATION: 8 weeks  PLANNED INTERVENTIONS: 97110-Therapeutic exercises, 97530- Therapeutic activity, 97112- Neuromuscular re-education, 97535- Self Care, 40981- Manual therapy, [id number]- Electrical stimulation (unattended), [phone]- Traction (mechanical), 97033- Ionotophoresis 4mg /ml Dexamethasone , Patient/Family education, Taping, Dry Needling, Joint mobilization, Spinal mobilization, Cryotherapy, and Moist heat  PLAN FOR NEXT SESSION: DN  #2; postural strengthening with red band; re- check resting jaw position; neck mobs, review HEP compliance   Darien Eden, PT 01/18/24 3:43 PM Phone: [phone] Fax: [phone]

## 2024-01-19 ENCOUNTER — Telehealth: Payer: Self-pay | Admitting: Internal Medicine

## 2024-01-19 ENCOUNTER — Other Ambulatory Visit (INDEPENDENT_AMBULATORY_CARE_PROVIDER_SITE_OTHER)

## 2024-01-19 DIAGNOSIS — I10 Essential (primary) hypertension: Secondary | ICD-10-CM | POA: Diagnosis not present

## 2024-01-19 DIAGNOSIS — Z79899 Other long term (current) drug therapy: Secondary | ICD-10-CM | POA: Diagnosis not present

## 2024-01-19 DIAGNOSIS — E559 Vitamin D deficiency, unspecified: Secondary | ICD-10-CM

## 2024-01-19 DIAGNOSIS — E785 Hyperlipidemia, unspecified: Secondary | ICD-10-CM | POA: Diagnosis not present

## 2024-01-19 LAB — HEPATIC FUNCTION PANEL
ALT: 28 U/L (ref 0–35)
AST: 24 U/L (ref 0–37)
Albumin: 4.1 g/dL (ref 3.5–5.2)
Alkaline Phosphatase: 70 U/L (ref 39–117)
Bilirubin, Direct: 0.1 mg/dL (ref 0.0–0.3)
Total Bilirubin: 0.5 mg/dL (ref 0.2–1.2)
Total Protein: 7 g/dL (ref 6.0–8.3)

## 2024-01-19 LAB — BASIC METABOLIC PANEL WITH GFR
BUN: 18 mg/dL (ref 6–23)
CO2: 29 meq/L (ref 19–32)
Calcium: 9.4 mg/dL (ref 8.4–10.5)
Chloride: 102 meq/L (ref 96–112)
Creatinine, Ser: 0.89 mg/dL (ref 0.40–1.20)
GFR: 62.89 mL/min (ref >60.00)
Glucose, Bld: 108 mg/dL — ABNORMAL HIGH (ref 70–99)
Potassium: 4.3 meq/L (ref 3.5–5.1)
Sodium: 138 meq/L (ref 135–145)

## 2024-01-19 LAB — CBC WITH DIFFERENTIAL/PLATELET
Basophils Absolute: 0.1 10*3/uL (ref 0.0–0.1)
Basophils Relative: 0.7 % (ref 0.0–3.0)
Eosinophils Absolute: 0.4 10*3/uL (ref 0.0–0.7)
Eosinophils Relative: 4.2 % (ref 0.0–5.0)
HCT: 40 % (ref 36.0–46.0)
Hemoglobin: 13.3 g/dL (ref 12.0–15.0)
Lymphocytes Relative: 25.3 % (ref 12.0–46.0)
Lymphs Abs: 2.4 10*3/uL (ref 0.7–4.0)
MCHC: 33.2 g/dL (ref 30.0–36.0)
MCV: 84.2 fl (ref 78.0–100.0)
Monocytes Absolute: 0.7 10*3/uL (ref 0.1–1.0)
Monocytes Relative: 7 % (ref 3.0–12.0)
Neutro Abs: 5.9 10*3/uL (ref 1.4–7.7)
Neutrophils Relative %: 62.8 % (ref 43.0–77.0)
Platelets: 210 10*3/uL (ref 150.0–400.0)
RBC: 4.75 Mil/uL (ref 3.87–5.11)
RDW: 12.9 % (ref 11.5–15.5)
WBC: 9.4 10*3/uL (ref 4.0–10.5)

## 2024-01-19 LAB — LIPID PANEL
Cholesterol: 167 mg/dL (ref 0–200)
HDL: 58.5 mg/dL (ref >39.00)
LDL Cholesterol: 82 mg/dL (ref 0–99)
NonHDL: 108.22
Total CHOL/HDL Ratio: 3
Triglycerides: 133 mg/dL (ref 0.0–149.0)
VLDL: 26.6 mg/dL (ref 0.0–40.0)

## 2024-01-19 LAB — HEMOGLOBIN A1C: Hgb A1c MFr Bld: 5.9 % (ref 4.6–6.5)

## 2024-01-19 LAB — TSH: TSH: 1.78 u[IU]/mL (ref 0.35–5.50)

## 2024-01-19 LAB — VITAMIN D 25 HYDROXY (VIT D DEFICIENCY, FRACTURES): VITD: 23.62 ng/mL — ABNORMAL LOW (ref 30.00–100.00)

## 2024-01-19 NOTE — Telephone Encounter (Signed)
 Pt came in on 01/18/24 for an appt and then came back today for fasting labs.  She states she is interested in starting to take [facility] or one of the injectables like that to help her lose weight.  She is requesting a call back.

## 2024-01-20 ENCOUNTER — Telehealth: Payer: Self-pay | Admitting: *Deleted

## 2024-01-20 MED ORDER — HYOSCYAMINE SULFATE 0.125 MG SL SUBL: SUBLINGUAL_TABLET | SUBLINGUAL | 1 refills | Status: DC

## 2024-01-20 NOTE — Telephone Encounter (Signed)
 Received fax for refill of Hyoscyamine  Electronically

## 2024-01-23 ENCOUNTER — Ambulatory Visit: Payer: Self-pay | Admitting: Internal Medicine

## 2024-01-23 NOTE — Progress Notes (Signed)
 Cholesterol in good range , blood sugar borderline elevated but no diabetes. VIt D is somewhat low . Rest of results are normal . Suggest increase intake of vit D by 1000 I U per day

## 2024-01-24 NOTE — Telephone Encounter (Signed)
 Spoke to pt. Pt inform she was at the lab and someone share to her that they had lost 8lbs using the wegovy and she wanted to give it a try as she wants to lose some weight.   Pt states she didn't discuss it at the visit. Offer to schedule an appt. Pt declined at this time and states she is low on fund and will call when she is ready.   Forwarding to Dr. Elinda Guard

## 2024-01-24 NOTE — Telephone Encounter (Signed)
 Do not think that  glp1  med will be  covered by insurance since she doesn't have diabetes , or the number of  diagnosis that are fda approved  for these meds    Will see if Michele Monroe can speak with her about this medication group

## 2024-01-25 ENCOUNTER — Ambulatory Visit: Admitting: Physical Therapy

## 2024-01-26 ENCOUNTER — Telehealth: Payer: Self-pay

## 2024-01-26 ENCOUNTER — Other Ambulatory Visit: Payer: Self-pay | Admitting: Internal Medicine

## 2024-01-26 DIAGNOSIS — R609 Edema, unspecified: Secondary | ICD-10-CM

## 2024-01-26 NOTE — Telephone Encounter (Signed)
 Forwarding request  Copied from CRM [id number]. Topic: Clinical - Medication Question >> Jan 26, 2024  8:08 AM Michele Monroe wrote: Reason for CRM: [facility] called in stating that patient would like to have prescriptions   atorvastatin  (LIPITOR) 20 MG tablet furosemide  (LASIX ) 20 MG tablet tiZANidine  (ZANAFLEX ) 4 MG tablet Sent to their pharmacy for mail delivery

## 2024-01-26 NOTE — Telephone Encounter (Signed)
 Copied from CRM [id number]. Topic: Clinical - Medication Refill >> Jan 26, 2024  8:14 AM [name] wrote: Medication:  omeprazole  (PRILOSEC) 20 MG capsule atenolol  (TENORMIN ) 25 MG tablet  Has the patient contacted their pharmacy? Yes (Agent: If no, request that the patient contact the pharmacy for the refill. If patient does not wish to contact the pharmacy document the reason why and proceed with request.) (Agent: If yes, when and what did the pharmacy advise?)  This is the patient's preferred pharmacy:   [facility] - [place], [place] - [address] [place] [address] Phone: [phone] Fax: [phone]  Is this the correct pharmacy for this prescription? Yes If no, delete pharmacy and type the correct one.   Has the prescription been filled recently? No  Is the patient out of the medication? Yes  Has the patient been seen for an appointment in the last year OR does the patient have an upcoming appointment? Yes  Can we respond through MyChart? Yes  Agent: Please be advised that Rx refills may take up to 3 business days. We ask that you follow-up with your pharmacy.

## 2024-01-27 ENCOUNTER — Ambulatory Visit: Admitting: Physical Therapy

## 2024-01-27 MED ORDER — ATENOLOL 25 MG PO TABS: 25.0000 mg | ORAL_TABLET | Freq: Every day | ORAL | 2 refills | Status: DC

## 2024-01-27 MED ORDER — OMEPRAZOLE 20 MG PO CPDR: DELAYED_RELEASE_CAPSULE | ORAL | 0 refills | Status: DC

## 2024-01-27 MED ORDER — ATORVASTATIN CALCIUM 20 MG PO TABS: 20.0000 mg | ORAL_TABLET | Freq: Every day | ORAL | 1 refills | Status: DC

## 2024-01-27 MED ORDER — FUROSEMIDE 20 MG PO TABS: 20.0000 mg | ORAL_TABLET | Freq: Every day | ORAL | 1 refills | Status: DC

## 2024-01-27 MED ORDER — TIZANIDINE HCL 4 MG PO TABS: ORAL_TABLET | ORAL | 1 refills | Status: DC

## 2024-01-27 NOTE — Telephone Encounter (Signed)
 Rx sent.

## 2024-01-27 NOTE — Addendum Note (Signed)
 Addended by: Delicia Berens on: 01/27/2024 08:40 AM   Modules accepted: Orders

## 2024-02-01 ENCOUNTER — Encounter: Admitting: Physical Therapy

## 2024-02-06 ENCOUNTER — Telehealth: Payer: Self-pay | Admitting: *Deleted

## 2024-02-06 NOTE — Progress Notes (Unsigned)
 Care Guide Pharmacy Note  02/06/2024 Name: Michele Monroe MRN: [id number] DOB: 1946/04/09  Referred By: Reginal Capra, MD Reason for referral: Complex Care Management and Call Attempt #1 (Outreach to schedule referral with pharmacist )   Michele Monroe is a 78 y.o. year old female who is a primary care patient of Panosh, Wanda K, MD.  [name] was referred to the pharmacist for assistance related to: GLP-1  An unsuccessful telephone outreach was attempted today to contact the patient who was referred to the pharmacy team for assistance with medication management. Additional attempts will be made to contact the patient.  [name], CMA [place]  [facility], [facility] Guide Direct Dial: [phone]  Fax: [phone] Website: [email or web address]

## 2024-02-07 NOTE — Progress Notes (Unsigned)
 Care Guide Pharmacy Note  02/07/2024 Name: Michele Monroe MRN: [id number] DOB: [birth year]  Referred By: Reginal Capra, MD Reason for referral: Complex Care Management and Call Attempt #1 (Outreach to schedule referral with pharmacist )   Michele Monroe is a 77 y.o. year old female who is a primary care patient of Panosh, Wanda K, MD.  Michele Monroe was referred to the pharmacist for assistance related to: GLP-1  A second unsuccessful telephone outreach was attempted today to contact the patient who was referred to the pharmacy team for assistance with medication management. Additional attempts will be made to contact the patient.  Michele Monroe, CMA [place]  [facility], [facility] Guide Direct Dial: [phone]  Fax: [phone] Website: [email or web address]

## 2024-02-08 ENCOUNTER — Ambulatory Visit: Admitting: Physical Therapy

## 2024-02-09 ENCOUNTER — Ambulatory Visit: Admitting: Family Medicine

## 2024-02-09 NOTE — Progress Notes (Signed)
 error

## 2024-02-09 NOTE — Progress Notes (Signed)
 Care Guide Pharmacy Note  02/09/2024 Name: LORALEE WEITZMAN MRN: [id number] DOB: 1945/10/17  Referred By: Reginal Capra, MD Reason for referral: Complex Care Management and Call Attempt #1 (Outreach to schedule referral with pharmacist )   ROSS HEFFERAN is a 78 y.o. year old female who is a primary care patient of Panosh, Wanda K, MD.  [name] was referred to the pharmacist for assistance related to: GLP-1  A third unsuccessful telephone outreach was attempted today to contact the patient who was referred to the pharmacy team for assistance with medication management. The Population Health team is pleased to engage with this patient at any time in the future upon receipt of referral and should he/she be interested in assistance from the Population Health team.  [name], CMA [facility]  [facility], [facility] Guide Direct Dial: [phone]  Fax: [phone] Website: [email or web address]

## 2024-02-10 ENCOUNTER — Ambulatory Visit: Admitting: Physical Therapy

## 2024-02-15 ENCOUNTER — Ambulatory Visit: Admitting: Physical Therapy

## 2024-02-17 ENCOUNTER — Ambulatory Visit: Admitting: Physical Therapy

## 2024-02-20 ENCOUNTER — Ambulatory Visit: Admitting: Physical Therapy

## 2024-02-21 ENCOUNTER — Telehealth: Payer: Self-pay | Admitting: Physical Therapy

## 2024-02-21 NOTE — Telephone Encounter (Signed)
 Left patient a message about her commitment to physical therapy and poor attendance. Notified patient of our attendance policy and notified her that we will be scheduling her appointments one at a time. If she does not come to next appointment she will be discharged. Left a call back number to the clinic.   Penelope Bowie, PT 02/21/24 3:30 PM

## 2024-02-22 ENCOUNTER — Ambulatory Visit: Admitting: Physical Therapy

## 2024-02-29 ENCOUNTER — Encounter: Admitting: Physical Therapy

## 2024-03-02 ENCOUNTER — Encounter: Admitting: Physical Therapy

## 2024-03-05 ENCOUNTER — Encounter: Admitting: Physical Therapy

## 2024-03-07 ENCOUNTER — Encounter: Admitting: Physical Therapy

## 2024-03-08 ENCOUNTER — Ambulatory Visit: Admitting: Family Medicine

## 2024-03-08 DIAGNOSIS — Z0000 Encounter for general adult medical examination without abnormal findings: Secondary | ICD-10-CM | POA: Diagnosis not present

## 2024-03-08 DIAGNOSIS — M8580 Other specified disorders of bone density and structure, unspecified site: Secondary | ICD-10-CM

## 2024-03-08 NOTE — Progress Notes (Signed)
 PATIENT CHECK-IN and HEALTH RISK ASSESSMENT QUESTIONNAIRE:  -completed by phone/video for upcoming Medicare Preventive Visit   Pre-Visit Check-in: 1)Vitals (height, wt, BP, etc) - record in vitals section for visit on day of visit Request home vitals (wt, BP, etc.) and enter into vitals, THEN update Vital Signs SmartPhrase below at the top of the HPI. See below.  2)Review and Update Medications, Allergies PMH, Surgeries, Social history in Epic 3)Hospitalizations in the last year with date/reason? n  4)Review and Update Care Team (patient's specialists) in Epic 5) Complete PHQ9 in Epic  6) Complete Fall Screening in Epic 7)Review all Health Maintenance Due and order under PCP if not done.  Medicare Wellness Patient Questionnaire:  Answer theses question about your habits: How often do you have a drink containing alcohol?n How many drinks containing alcohol do you have on a typical day when you are drinking?na How often do you have six or more drinks on one occasion?na Have you ever smoked?very remotely over 40 years ago How many packs a day do/did you smoke? na Do you use smokeless tobacco?n Do you use an illicit drugs?n On average, how many days per week do you engage in moderate to strenuous exercise (like a brisk walk)?walks her dogs 2-3 times per day 5-10 minutes each time Typical diet: does not usually eat breakfast, sometimes a hard boiled egg, sometimes yogurt, for lunch/dinner sometimes tortilla with chicken, salad, sometimes greek food with chicken and salad. Drinks water and ginger ale. Good social connection, 3 granddaughters she interacts with a lot. Talks to daughter daily.  Answer theses question about your everyday activities: Can you perform most household chores?y Are you deaf or have significant trouble hearing?n Do you feel that you have a problem with memory? n Do you feel safe at home?y Last dentist visit? Goes on regular basis 8. Do you have any difficulty  performing your everyday activities?n Are you having any difficulty walking, taking medications on your own, and or difficulty managing daily home needs?n Do you have difficulty walking or climbing stairs?n Do you have difficulty dressing or bathing?n Do you have difficulty doing errands alone such as visiting a doctor's office or shopping?n Do you currently have any difficulty preparing food and eating?n Do you currently have any difficulty using the toilet?n Do you have any difficulty managing your finances?n Do you have any difficulties with housekeeping of managing your housekeeping?n   Do you have Advanced Directives in place (Living Will, Healthcare Power or Attorney)? y   Last eye Exam and location?did eye exam recently - [facility].    Do you currently use prescribed or non-prescribed narcotic or opioid pain medications?n  Do you have a history or close family history of breast, ovarian, tubal or peritoneal cancer or a family member with BRCA (breast cancer susceptibility 1 and 2) gene mutations? Mom had colon cancer so she gets colonoscopy every 3 years     ----------------------------------------------------------------------------------------------------------------------------------------------------------------------------------------------------------------------  Because this visit was a virtual/telehealth visit, some criteria may be missing or patient reported. Any vitals not documented were not able to be obtained and vitals that have been documented are patient reported.    MEDICARE ANNUAL PREVENTIVE VISIT WITH PROVIDER: (Welcome to Medicare, initial annual wellness or annual wellness exam)  Virtual Visit via Video Note  I connected with Michele Monroe on 03/08/24 by a video enabled telemedicine application and verified that I am speaking with the correct person using two identifiers.  Location patient: home Location provider:work or home office  Persons  participating in the virtual visit: patient, provider  Concerns and/or follow up today: doing good. Sometimes has allergies - which is normal for her.    See HM section in Epic for other details of completed HM.    ROS: negative for report of fevers, unintentional weight loss, vision changes, vision loss, hearing loss or change, chest pain, sob, hemoptysis, melena, hematochezia, hematuria, falls, bleeding or bruising, thoughts of suicide or self harm, memory loss  Patient-completed extensive health risk assessment - reviewed and discussed with the patient: See Health Risk Assessment completed with patient prior to the visit either above or in recent phone note. This was reviewed in detailed with the patient today and appropriate recommendations, orders and referrals were placed as needed per Summary below and patient instructions.   Review of Medical History: -PMH, PSH, Family History and current specialty and care providers reviewed and updated and listed below   Patient Care Team: Panosh, Apolinar POUR, MD as PCP - Diedre Dunnings, Juliene SAUNDERS, DO as Consulting Physician (Neurology) Nandigam, Kavitha V, MD as Consulting Physician (Gastroenterology)   Past Medical History:  Diagnosis Date   Adenomatous colon polyp    ALLERGIC RHINITIS 06/30/2010   perennial   Allergy    seasonal allergies   Anticoagulant long-term use 05/12/2022   For rle prox dvt end feb 2023     Anxiety    hx of   Arthritis    bilateral hands (R>L)   Coughing up blood 08/13/2012   piece of tissue? will send to path poss from upper airway sinusitis  Final path was abscess    Depression    hx of   GERD 06/30/2010   on meds   Headache(784.0) 06/30/2010   migraines   Hepatic steatosis    HYPERLIPIDEMIA 06/30/2010   on meds   HYPERTENSION 06/30/2010   on meds   IBS 06/30/2010   Internal hemorrhoids    Renal lesion    Varicose veins    VITAMIN D  DEFICIENCY 06/30/2010    Past Surgical History:  Procedure  Laterality Date   BREAST CYST ASPIRATION     BREAST SURGERY     needle bx left breast   COLONOSCOPY  2016   KN-prep good-TA   feet     hammer toe sx   POLYPECTOMY  2016   TA   ROTATOR CUFF REPAIR Right    varicose veins      Social History   Socioeconomic History   Marital status: Widowed    Spouse name: Not on file   Number of children: 2   Years of education: Not on file   Highest education level: 12th grade  Occupational History   Occupation: retired    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  Tobacco Use   Smoking status: Former    Current packs/day: 0.00    Types: Cigarettes    Quit date: 09/06/1976    Years since quitting: 47.5   Smokeless tobacco: Never  Vaping Use   Vaping status: Never Used  Substance and Sexual Activity   Alcohol use: No    Alcohol/week: 0.0 standard drinks of alcohol    Comment: rarely   Drug use: No   Sexual activity: Not Currently  Other Topics Concern   Not on file  Social History Narrative   Widower  Husband died suddently  PTSD in his sleep   Former smoker in her 3's   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       Lives in condo      3 cups of caffinated coffee per day. Denies soda/tea   Right handed   Social Drivers of Health   Financial Resource Strain: Medium Risk (01/26/2023)   Overall Financial Resource Strain (CARDIA)    Difficulty of Paying Living Expenses: Somewhat hard  Food Insecurity: Food Insecurity Present (01/28/2023)   Hunger Vital Sign    Worried About Running Out of Food in the Last Year: Sometimes true    Ran Out of Food in the Last Year: Sometimes true  Transportation Needs: No Transportation Needs (01/28/2023)   PRAPARE - Administrator, Civil Service (Medical): No    Lack of Transportation (Non-Medical): No  Physical Activity: Insufficiently Active (01/26/2023)   Exercise Vital Sign    Days of Exercise per Week: 5 days     Minutes of Exercise per Session: 10 min  Stress: Stress Concern Present (01/26/2023)   [employer] of Occupational Health - Occupational Stress Questionnaire    Feeling of Stress : To some extent  Social Connections: Unknown (01/26/2023)   Social Connection and Isolation Panel    Frequency of Communication with Friends and Family: More than three times a week    Frequency of Social Gatherings with Friends and Family: More than three times a week    Attends Religious Services: Patient declined    Database administrator or Organizations: No    Attends Banker Meetings: Not on file    Marital Status: Widowed  Recent Concern: Social Connections - Socially Isolated (01/14/2023)   Social Connection and Isolation Panel    Frequency of Communication with Friends and Family: More than three times a week    Frequency of Social Gatherings with Friends and Family: More than three times a week    Attends Religious Services: Never    Database administrator or Organizations: No    Attends Banker Meetings: Never    Marital Status: Widowed  Intimate Partner Violence: Not At Risk (01/28/2023)   Humiliation, Afraid, Rape, and Kick questionnaire    Fear of Current or Ex-Partner: No    Emotionally Abused: No    Physically Abused: No    Sexually Abused: No    Family History  Problem Relation Age of Onset   Colon cancer Mother        age 47   Colon polyps Mother    Breast cancer Cousin    Arthritis Brother    Other Brother    Colon polyps Brother    Esophageal cancer Neg Hx    Stomach cancer Neg Hx    Rectal cancer Neg Hx     Current Outpatient Medications on File Prior to Visit  Medication Sig Dispense Refill   albuterol  (VENTOLIN  HFA) 108 (90 Base) MCG/ACT inhaler Inhale 2 puffs by mouth every 6 hours as needed. LAST refill pt needs appt. (Patient not taking: No sig reported) 8.5 each 11   atenolol  (TENORMIN ) 25 MG tablet Take 1 tablet (25 mg total) by mouth  daily. 90 tablet 2   atorvastatin  (LIPITOR) 20 MG tablet Take 1 tablet (20 mg total) by mouth daily. 90 tablet 1   cetirizine  (ZYRTEC ) 10 MG chewable tablet Chew 10 mg by mouth daily. (Patient not taking: Reported on 01/18/2024)     fluticasone  (FLONASE ) 50 MCG/ACT nasal spray Place 1 spray  into both nostrils daily. (Patient taking differently: Place 1 spray into both nostrils as needed.) 16 mL 2   fluticasone -salmeterol (ADVAIR DISKUS) 500-50 MCG/ACT AEPB Inhale 1 puff into the lungs in the morning and at bedtime. 60 each 3   furosemide  (LASIX ) 20 MG tablet Take 1 tablet (20 mg total) by mouth daily. 90 tablet 1   hyoscyamine  (LEVSIN  SL) 0.125 MG SL tablet Dissolve 1 tablet under the tongue every morning as needed 90 tablet 1   ibuprofen (ADVIL) 200 MG tablet Take 400 mg by mouth every 6 (six) hours as needed for headache. (Patient not taking: Reported on 01/18/2024)     ipratropium-albuterol  (DUONEB) 0.5-2.5 (3) MG/3ML SOLN Take 3 mLs by nebulization every 6 (six) hours as needed (Shortness of breath/wheezing). 360 mL 0   omeprazole  (PRILOSEC) 20 MG capsule TAKE 1 CAPSULE BY MOUTH TWICE A DAY BEFORE A MEAL 180 capsule 0   PATADAY 0.1 % ophthalmic solution Place 1 drop into both eyes 2 (two) times daily as needed for allergies.     tiZANidine  (ZANAFLEX ) 4 MG tablet TAKE 1/2 TO 1 TABLET BY MOUTH AT BEDTIME AS NEEDED FOR NECK PAIN. 90 tablet 1   Ubrogepant  (UBRELVY ) 100 MG TABS Take 1 tablet (100 mg total) by mouth as needed (take 1 tab at the earlist onset of a Migraine. May repeat in 2 hours. Max 2 tab in 24 hours). 8 tablet 5   No current facility-administered medications on file prior to visit.    No Known Allergies     Physical Exam Vitals requested from patient and listed below if patient had equipment and was able to obtain at home for this virtual visit: There were no vitals filed for this visit. Estimated body mass index is 29.7 kg/m as calculated from the following:   Height as of  01/18/24: 5' 4.8 (1.646 m).   Weight as of 01/18/24: 177 lb 6.4 oz (80.5 kg).  EKG (optional): deferred due to virtual visit  GENERAL: alert, oriented, no acute distress detected, full vision exam deferred due to pandemic and/or virtual encounter  HEENT: atraumatic, conjunttiva clear, no obvious abnormalities on inspection of external nose and ears  NECK: normal movements of the head and neck  LUNGS: on inspection no signs of respiratory distress, breathing rate appears normal, no obvious gross SOB, gasping or wheezing  CV: no obvious cyanosis  MS: moves all visible extremities without noticeable abnormality  PSYCH/NEURO: pleasant and cooperative, no obvious depression or anxiety, speech and thought processing grossly intact, Cognitive function grossly intact  Flowsheet Row Clinical Support from 01/14/2023 in [facility]  PHQ-9 Total Score 0        03/08/2024   11:34 AM 08/17/2023    1:16 PM 01/14/2023    3:31 PM 12/21/2022    4:36 PM 11/23/2022   10:57 AM  Depression screen PHQ 2/9  Decreased Interest 0 0 0 0 0  Down, Depressed, Hopeless 0 0 0 0 0  PHQ - 2 Score 0 0 0 0 0  Altered sleeping   0 3   Tired, decreased energy   0 3   Change in appetite   0 0   Feeling bad or failure about yourself    0 2   Trouble concentrating   0 0   Moving slowly or fidgety/restless   0 0   Suicidal thoughts   0 0   PHQ-9 Score   0 8   Difficult doing work/chores  Not difficult at all Somewhat difficult        01/14/2023    3:32 PM 01/26/2023   11:21 AM 08/17/2023    1:16 PM 10/18/2023   10:47 AM 03/08/2024   11:34 AM  Fall Risk  Falls in the past year? 0 0 0 0 0  Was there an injury with Fall? 0  0  0  Fall Risk Category Calculator 0  0  0  Patient at Risk for Falls Due to No Fall Risks  No Fall Risks    Fall risk Follow up Falls prevention discussed  Falls evaluation completed  Falls evaluation completed     SUMMARY AND PLAN:  Encounter for  Medicare annual wellness exam  Osteopenia, unspecified location - Plan: DG Bone Density   Discussed applicable health maintenance/preventive health measures and advised and referred or ordered per patient preferences: -discussed colonoscopy due in August and she agrees to call her GI doc -discussed vaccines due - she declined -she is do for bone density test, discussed, orders placed Health Maintenance  Topic Date Due   Colonoscopy  04/06/2024   Zoster Vaccines- Shingrix (1 of 2) 04/19/2024 (Originally 06/07/1965)   DTaP/Tdap/Td (2 - Tdap) 01/17/2025 (Originally 09/06/2017)   INFLUENZA VACCINE  04/06/2024   Medicare Annual Wellness (AWV)  03/08/2025   Pneumococcal Vaccine: 50+ Years  Completed   DEXA SCAN  Completed   Hepatitis C Screening  Completed   Hepatitis B Vaccines  Aged Out   HPV VACCINES  Aged Out   Meningococcal B Vaccine  Aged Out   COVID-19 Vaccine  Discontinued      Education and counseling on the following was provided based on the above review of health and a plan/checklist for the patient, along with additional information discussed, was provided for the patient in the patient instructions :   -Advised and counseled on a healthy lifestyle - including the importance of a healthy diet, regular physical activity, social connections  -Reviewed patient's current diet. Advised and counseled on a whole foods based healthy diet. A summary of a healthy diet was provided in the Patient Instructions.  -reviewed patient's current physical activity level and discussed exercise guidelines for adults. Discussed community resources and ideas for safe exercise at home to assist in meeting exercise guidelines. Discussed adding strength training 2x per week. -Advise yearly dental visits at minimum and regular eye exams  Follow up: see patient instructions     Patient Instructions  I really enjoyed getting to talk with you today! I am available on Tuesdays and Thursdays for virtual  visits if you have any questions or concerns, or if I can be of any further assistance.   CHECKLIST FROM ANNUAL WELLNESS VISIT:  -Follow up (please call to schedule if not scheduled after visit):   -yearly for annual wellness visit with primary care office  Here is a list of your preventive care/health maintenance measures and the plan for each if any are due:  PLAN For any measures below that may be due:    1. Orders for bone density test were sent today. If you are not contacted by scheduling in the next 1-2 weeks, please call our office to check on the status: [phone]   2. Please call your gastroenterologist to schedule your colonoscopy.   3. If you wish to get the vaccines, you can do so at the pharmacy, please let us  know if you do so that we can update your record.   Health Maintenance  Topic Date Due   Colonoscopy  04/06/2024   Zoster Vaccines- Shingrix (1 of 2) 04/19/2024 (Originally 06/07/1965)   DTaP/Tdap/Td (2 - Tdap) 01/17/2025 (Originally 09/06/2017)   INFLUENZA VACCINE  04/06/2024   Medicare Annual Wellness (AWV)  03/08/2025   Pneumococcal Vaccine: 50+ Years  Completed   DEXA SCAN  Completed   Hepatitis C Screening  Completed   Hepatitis B Vaccines  Aged Out   HPV VACCINES  Aged Out   Meningococcal B Vaccine  Aged Out   COVID-19 Vaccine  Discontinued    -See a dentist at least yearly  -Get your eyes checked and then per your eye specialist's recommendations  -Other issues addressed today:   -I have included below further information regarding a healthy whole foods based diet, physical activity guidelines for adults, stress management and opportunities for social connections. I hope you find this information useful.    -----------------------------------------------------------------------------------------------------------------------------------------------------------------------------------------------------------------------------------------------------------    NUTRITION: -eat real food: lots of colorful vegetables (half the plate) and fruits -5-7 servings of vegetables and fruits per day (fresh or steamed is best), exp. 2 servings of vegetables with lunch and dinner and 2 servings of fruit per day. Berries and greens such as kale and collards are great choices.  -consume on a regular basis:  fresh fruits, fresh veggies, fish, nuts, seeds, healthy oils (such as olive oil, avocado oil), whole grains (make sure for bread/pasta/crackers/etc., that the first ingredient on label contains the word whole), legumes. -can eat small amounts of dairy and lean meat (no larger than the palm of your hand), but avoid processed meats such as ham, bacon, lunch meat, etc. -drink water -try to avoid fast food and pre-packaged foods, processed meat, ultra processed foods/beverages (donuts, candy, etc.) -most experts advise limiting sodium to < 2300mg  per day, should limit further is any chronic conditions such as high blood pressure, heart disease, diabetes, etc. The American Heart Association advised that < 1500mg  is is ideal -try to avoid foods/beverages that contain any ingredients with names you do not recognize  -try to avoid foods/beverages  with added sugar or sweeteners/sweets  -try to avoid sweet drinks (including diet drinks): soda, juice, Gatorade, sweet tea, power drinks, diet drinks -try to avoid white rice, white bread, pasta (unless whole grain)  EXERCISE GUIDELINES FOR ADULTS: -if you wish to increase your physical activity, do so gradually and with the approval of your doctor -STOP and seek medical care immediately if you have any chest pain, chest discomfort or trouble breathing when starting or  increasing exercise  -move and stretch your body, legs, feet and arms when sitting for long periods -Physical activity guidelines for optimal health in adults: -get at least 150 minutes per week of moderate exercise (can talk, but not sing); this is about 20-30 minutes of sustained activity 5-7 days per week or two 10-15 minute episodes of sustained activity 5-7 days per week -do some muscle building/resistance training/strength training at least 2 days per week  -balance exercises 3+ days per week:   Stand somewhere where you have something sturdy to hold onto if you lose balance    1) lift up on toes, then back down, start with 5x per day and work up to 20x   2) stand and lift one leg straight out to the side so that foot is a few inches of the floor, start with 5x each side and work up to 20x each side   3) stand on one foot, start with 5 seconds each side  and work up to 20 seconds on each side  If you need ideas or help with getting more active:  -Silver sneakers https://tools.silversneakers.com  -Walk with a Doc: http://www.duncan-williams.com/  -try to include resistance (weight lifting/strength building) and balance exercises twice per week: or the following link for ideas: http://castillo-powell.com/  BuyDucts.dk  STRESS MANAGEMENT: -can try meditating, or just sitting quietly with deep breathing while intentionally relaxing all parts of your body for 5 minutes daily -if you need further help with stress, anxiety or depression please follow up with your primary doctor or contact the wonderful folks at [facility]: [phone]  SOCIAL CONNECTIONS: -options in [place] if you wish to engage in more social and exercise related activities:  -Silver sneakers https://tools.silversneakers.com  -Walk with a Doc: http://www.duncan-williams.com/  -Check out the [facility] Active Adults 50+  section on the [place] of [employer] (hiking clubs, book clubs, cards and games, chess, exercise classes, aquatic classes and much more) - see the website for details: https://www.[place].gov/departments/parks-recreation/active-adults50  -YouTube has lots of exercise videos for different ages and abilities as well  -[facility] (a variety of indoor and outdoor inperson activities for adults). [phone]. [address]  -Virtual Online Classes (a variety of topics): see seniorplanet.org or call [phone]  -consider volunteering at a school, hospice center, church, senior center or elsewhere            Michele JONELLE Cramp, DO

## 2024-03-08 NOTE — Patient Instructions (Signed)
 I really enjoyed getting to talk with you today! I am available on Tuesdays and Thursdays for virtual visits if you have any questions or concerns, or if I can be of any further assistance.   CHECKLIST FROM ANNUAL WELLNESS VISIT:  -Follow up (please call to schedule if not scheduled after visit):   -yearly for annual wellness visit with primary care office  Here is a list of your preventive care/health maintenance measures and the plan for each if any are due:  PLAN For any measures below that may be due:    1. Orders for bone density test were sent today. If you are not contacted by scheduling in the next 1-2 weeks, please call our office to check on the status: [phone]   2. Please call your gastroenterologist to schedule your colonoscopy.   3. If you wish to get the vaccines, you can do so at the pharmacy, please let us  know if you do so that we can update your record.   Health Maintenance  Topic Date Due   Colonoscopy  04/06/2024   Zoster Vaccines- Shingrix (1 of 2) 04/19/2024 (Originally 06/07/1965)   DTaP/Tdap/Td (2 - Tdap) 01/17/2025 (Originally 09/06/2017)   INFLUENZA VACCINE  04/06/2024   Medicare Annual Wellness (AWV)  03/08/2025   Pneumococcal Vaccine: 50+ Years  Completed   DEXA SCAN  Completed   Hepatitis C Screening  Completed   Hepatitis B Vaccines  Aged Out   HPV VACCINES  Aged Out   Meningococcal B Vaccine  Aged Out   COVID-19 Vaccine  Discontinued    -See a dentist at least yearly  -Get your eyes checked and then per your eye specialist's recommendations  -Other issues addressed today:   -I have included below further information regarding a healthy whole foods based diet, physical activity guidelines for adults, stress management and opportunities for social connections. I hope you find this information useful.    -----------------------------------------------------------------------------------------------------------------------------------------------------------------------------------------------------------------------------------------------------------    NUTRITION: -eat real food: lots of colorful vegetables (half the plate) and fruits -5-7 servings of vegetables and fruits per day (fresh or steamed is best), exp. 2 servings of vegetables with lunch and dinner and 2 servings of fruit per day. Berries and greens such as kale and collards are great choices.  -consume on a regular basis:  fresh fruits, fresh veggies, fish, nuts, seeds, healthy oils (such as olive oil, avocado oil), whole grains (make sure for bread/pasta/crackers/etc., that the first ingredient on label contains the word whole), legumes. -can eat small amounts of dairy and lean meat (no larger than the palm of your hand), but avoid processed meats such as ham, bacon, lunch meat, etc. -drink water -try to avoid fast food and pre-packaged foods, processed meat, ultra processed foods/beverages (donuts, candy, etc.) -most experts advise limiting sodium to < 2300mg  per day, should limit further is any chronic conditions such as high blood pressure, heart disease, diabetes, etc. The American Heart Association advised that < 1500mg  is is ideal -try to avoid foods/beverages that contain any ingredients with names you do not recognize  -try to avoid foods/beverages  with added sugar or sweeteners/sweets  -try to avoid sweet drinks (including diet drinks): soda, juice, Gatorade, sweet tea, power drinks, diet drinks -try to avoid white rice, white bread, pasta (unless whole grain)  EXERCISE GUIDELINES FOR ADULTS: -if you wish to increase your physical activity, do so gradually and with the approval of your doctor -STOP and seek medical care immediately if you have any chest pain, chest  discomfort or trouble breathing when starting or  increasing exercise  -move and stretch your body, legs, feet and arms when sitting for long periods -Physical activity guidelines for optimal health in adults: -get at least 150 minutes per week of moderate exercise (can talk, but not sing); this is about 20-30 minutes of sustained activity 5-7 days per week or two 10-15 minute episodes of sustained activity 5-7 days per week -do some muscle building/resistance training/strength training at least 2 days per week  -balance exercises 3+ days per week:   Stand somewhere where you have something sturdy to hold onto if you lose balance    1) lift up on toes, then back down, start with 5x per day and work up to 20x   2) stand and lift one leg straight out to the side so that foot is a few inches of the floor, start with 5x each side and work up to 20x each side   3) stand on one foot, start with 5 seconds each side and work up to 20 seconds on each side  If you need ideas or help with getting more active:  -Silver sneakers https://tools.silversneakers.com  -Walk with a Doc: http://www.duncan-williams.com/  -try to include resistance (weight lifting/strength building) and balance exercises twice per week: or the following link for ideas: http://castillo-powell.com/  BuyDucts.dk  STRESS MANAGEMENT: -can try meditating, or just sitting quietly with deep breathing while intentionally relaxing all parts of your body for 5 minutes daily -if you need further help with stress, anxiety or depression please follow up with your primary doctor or contact the wonderful folks at [facility]: [phone]  SOCIAL CONNECTIONS: -options in [place] if you wish to engage in more social and exercise related activities:  -Silver sneakers https://tools.silversneakers.com  -Walk with a Doc: http://www.duncan-williams.com/  -Check out the [facility] Active Adults 50+  section on the [place] of [employer] (hiking clubs, book clubs, cards and games, chess, exercise classes, aquatic classes and much more) - see the website for details: https://www.[place].gov/departments/parks-recreation/active-adults50  -YouTube has lots of exercise videos for different ages and abilities as well  -[facility] (a variety of indoor and outdoor inperson activities for adults). [phone]. [address]  -Virtual Online Classes (a variety of topics): see seniorplanet.org or call 4382660057  -consider volunteering at a school, hospice center, church, senior center or elsewhere

## 2024-03-12 ENCOUNTER — Encounter: Admitting: Physical Therapy

## 2024-03-14 ENCOUNTER — Encounter: Admitting: Physical Therapy

## 2024-03-21 ENCOUNTER — Encounter: Admitting: Physical Therapy

## 2024-03-26 NOTE — Progress Notes (Signed)
 NEUROLOGY FOLLOW UP OFFICE NOTE  [name] [id number]  Assessment/Plan:   Migraine without aura, without status migrainosus, not intractable, likely cervicogenic Cervical spondylosis.    1.  Migraine rescue:  Restart rizatriptan10mg  for migraine 2.  Tizanidine  at bedtime for neck pain.   3.  Limit use of pain relievers to no more than 9 days out of the month to prevent risk of rebound or medication-overuse headache. 4.  Keep headache diary 5.  Follow up 1 year.    Subjective:  Michele Monroe is a 78 year old right-handed female with hypertension, GERD, IBS, hyperlipidemia who follows up for migraines.   UPDATE: Migraines have been well-controlled.  Intensity:  severe Duration:  2 hours with Ubrelvy  but expensive. Frequency:  on average 4 a month (more or less depending on anxiety or weather) NSAIDs/analgesics:  tramadol  for abortive neck and shoulder pain. Anti-emetic:  Zofran  ODT 4mg   Muscle relaxant:  Tizanidine  4mg  at bedtime Antihypertensive:  Atenolol  25mg  daily CGRP inhibitor:  Ubrelvy  100mg  Other treatment:  chiropractic medicine      HISTORY: Onset: 78 years old Location:  Bilateral occipital region Quality:  throbbing Initial intensity:  Starts as 3/10 and progresses to 10/10 She has chronic neck pain that is worse with neck movement.  Migraines often preceded by flare up of neck and shoulder pain. Aura:  no Prodrome:  no Postdrome:  nausea Associated symptoms: Sometimes nausea and vomiting.  Photophobia and phonophobia.  No visual disturbance..  She has not had any new worse headache of her life, waking up from sleep Initial Duration:  Several days without treatment (30 minutes with Maxalt ) Initial Frequency:  2 to 3 days per week Initial Frequency of abortive medication: 2 days per week Triggers/exacerbating factors: Neck movement, change in barometric pressure Relieving factors: None Activity:  Aggravates.  Needs to lay down   Past NSAIDS:   Ibuprofen, Aleve, Excedrin Past analgesics:  Excedrin, Tylenol , tramadol  Past abortive triptans:  rizatriptan  (effective but would rather avoid due to age) Past muscle relaxants:  Flexeril Past anti-emetic:  Yes but uncertain of type Past antihypertensive medications:  no Past antidepressant medications:  Unsure.  Not amitriptyline, nortriptyline or Effexor Past anticonvulsant medications:  topiramate (ineffective), gabapentin  Past vitamins/Herbal/Supplements:  no Other past therapies:  heat and ice, exercises, massage for neck pain, dry needling (expensive)   Family history of headache:  Daughter has migraines   Report of an old CT of the head from 06/24/05 (images not currently available for review) demonstrated "two areas of low density in the inferior left basal ganglia" felt to be more likely prominent perivascular spaces rather than old lacunar infarcts.  CT of cervical spine on the same day demonstrated multilevel degenerative changes.  MRI of cervical spine from 06/06/17 demonstrated multilevel spondylolisthesis with cervical facet arthropathy with severe neural foraminal stenosis at bilateral C6 and left C7.  She was evaluated by interventional pain management who told her nothing interventional was warranted as it was all arthritic changes.  She notices some memory problems.  She may forget why she walked into a room.  Sometimes she needs to use the GPS but not always and not generally getting lost on familiar routes. Able to manage  her finances.  Her family has not been concerned about her memory.   PAST MEDICAL HISTORY: Past Medical History:  Diagnosis Date   Adenomatous colon polyp    ALLERGIC RHINITIS 06/30/2010   perennial   Allergy    seasonal allergies  Anticoagulant long-term use 05/12/2022   For rle prox dvt end feb 2023     Anxiety    hx of   Arthritis    bilateral hands (R>L)   Coughing up blood 08/13/2012   piece of tissue? will send to path poss from upper  airway sinusitis  Final path was abscess    Depression    hx of   GERD 06/30/2010   on meds   Headache(784.0) 06/30/2010   migraines   Hepatic steatosis    HYPERLIPIDEMIA 06/30/2010   on meds   HYPERTENSION 06/30/2010   on meds   IBS 06/30/2010   Internal hemorrhoids    Renal lesion    Varicose veins    VITAMIN D  DEFICIENCY 06/30/2010    MEDICATIONS: Current Outpatient Medications on File Prior to Visit  Medication Sig Dispense Refill   albuterol  (VENTOLIN  HFA) 108 (90 Base) MCG/ACT inhaler Inhale 2 puffs by mouth every 6 hours as needed. LAST refill pt needs appt. (Patient not taking: No sig reported) 8.5 each 11   atenolol  (TENORMIN ) 25 MG tablet Take 1 tablet (25 mg total) by mouth daily. 90 tablet 2   atorvastatin  (LIPITOR) 20 MG tablet Take 1 tablet (20 mg total) by mouth daily. 90 tablet 1   cetirizine  (ZYRTEC ) 10 MG chewable tablet Chew 10 mg by mouth daily. (Patient not taking: Reported on 01/18/2024)     fluticasone  (FLONASE ) 50 MCG/ACT nasal spray Place 1 spray into both nostrils daily. (Patient taking differently: Place 1 spray into both nostrils as needed.) 16 mL 2   fluticasone -salmeterol (ADVAIR DISKUS) 500-50 MCG/ACT AEPB Inhale 1 puff into the lungs in the morning and at bedtime. 60 each 3   furosemide  (LASIX ) 20 MG tablet Take 1 tablet (20 mg total) by mouth daily. 90 tablet 1   hyoscyamine  (LEVSIN  SL) 0.125 MG SL tablet Dissolve 1 tablet under the tongue every morning as needed 90 tablet 1   ibuprofen (ADVIL) 200 MG tablet Take 400 mg by mouth every 6 (six) hours as needed for headache. (Patient not taking: Reported on 01/18/2024)     ipratropium-albuterol  (DUONEB) 0.5-2.5 (3) MG/3ML SOLN Take 3 mLs by nebulization every 6 (six) hours as needed (Shortness of breath/wheezing). 360 mL 0   omeprazole  (PRILOSEC) 20 MG capsule TAKE 1 CAPSULE BY MOUTH TWICE A DAY BEFORE A MEAL 180 capsule 0   PATADAY 0.1 % ophthalmic solution Place 1 drop into both eyes 2 (two) times  daily as needed for allergies.     tiZANidine  (ZANAFLEX ) 4 MG tablet TAKE 1/2 TO 1 TABLET BY MOUTH AT BEDTIME AS NEEDED FOR NECK PAIN. 90 tablet 1   Ubrogepant  (UBRELVY ) 100 MG TABS Take 1 tablet (100 mg total) by mouth as needed (take 1 tab at the earlist onset of a Migraine. May repeat in 2 hours. Max 2 tab in 24 hours). 8 tablet 5   No current facility-administered medications on file prior to visit.    ALLERGIES: No Known Allergies  FAMILY HISTORY: Family History  Problem Relation Age of Onset   Colon cancer Mother        age 37   Colon polyps Mother    Breast cancer Cousin    Arthritis Brother    Other Brother    Colon polyps Brother    Esophageal cancer Neg Hx    Stomach cancer Neg Hx    Rectal cancer Neg Hx       Objective:  Blood pressure 98/63, pulse 73, height  5' 5 (1.651 m), weight 177 lb 9.6 oz (80.6 kg), SpO2 93%. General: No acute distress.  Patient appears well-groomed.   Head:  Normocephalic/atraumatic Neck:  Supple.  mild paraspinal tenderness.  Full range of motion. Heart:  Regular rate and rhythm. Neuro:  Alert and oriented.  Speech fluent and not dysarthric.  Language intact.  CN II-XII intact.  Bulk and tone normal.  Muscle strength 5/5 throughout.  Sensation to light touch intact.  Deep tendon reflexes 2+ throughout, toes downgoing.  Gait normal.  Romberg negative.    Juliene Dunnings, DO  CC: Apolinar Eastern, MD

## 2024-03-27 ENCOUNTER — Ambulatory Visit: Payer: Medicare Other | Admitting: Neurology

## 2024-03-27 ENCOUNTER — Encounter: Payer: Self-pay | Admitting: Neurology

## 2024-03-27 VITALS — BP 98/63 | HR 73 | Ht 65.0 in | Wt 177.6 lb

## 2024-03-27 DIAGNOSIS — G43009 Migraine without aura, not intractable, without status migrainosus: Secondary | ICD-10-CM | POA: Diagnosis not present

## 2024-03-27 DIAGNOSIS — M47812 Spondylosis without myelopathy or radiculopathy, cervical region: Secondary | ICD-10-CM

## 2024-03-27 MED ORDER — TIZANIDINE HCL 4 MG PO TABS: ORAL_TABLET | ORAL | 3 refills | Status: DC

## 2024-03-27 MED ORDER — RIZATRIPTAN BENZOATE 10 MG PO TABS: ORAL_TABLET | ORAL | 3 refills | Status: DC

## 2024-03-27 NOTE — Patient Instructions (Signed)
 Rizatriptan  as needed/as directed Limit use of pain relievers to no more than 9 days out of the month to prevent risk of rebound or medication-overuse headache. Keep headache diary Tizanidine  for neck pain

## 2024-04-05 ENCOUNTER — Other Ambulatory Visit: Payer: Self-pay | Admitting: Internal Medicine

## 2024-04-06 ENCOUNTER — Telehealth: Payer: Self-pay | Admitting: Internal Medicine

## 2024-04-11 DIAGNOSIS — L309 Dermatitis, unspecified: Secondary | ICD-10-CM | POA: Diagnosis not present

## 2024-04-23 ENCOUNTER — Encounter: Payer: Self-pay | Admitting: Gastroenterology

## 2024-05-23 ENCOUNTER — Ambulatory Visit (AMBULATORY_SURGERY_CENTER)

## 2024-05-23 VITALS — Ht 65.0 in | Wt 177.8 lb

## 2024-05-23 DIAGNOSIS — Z860100 Personal history of colon polyps, unspecified: Secondary | ICD-10-CM

## 2024-05-23 NOTE — Progress Notes (Signed)

## 2024-06-05 ENCOUNTER — Encounter: Admitting: Gastroenterology

## 2024-06-17 ENCOUNTER — Other Ambulatory Visit: Payer: Self-pay | Admitting: Gastroenterology

## 2024-06-22 ENCOUNTER — Other Ambulatory Visit: Payer: Self-pay | Admitting: Internal Medicine

## 2024-06-22 DIAGNOSIS — Z1231 Encounter for screening mammogram for malignant neoplasm of breast: Secondary | ICD-10-CM

## 2024-07-06 ENCOUNTER — Other Ambulatory Visit: Payer: Self-pay | Admitting: Internal Medicine

## 2024-07-09 ENCOUNTER — Telehealth: Payer: Self-pay | Admitting: Gastroenterology

## 2024-07-09 ENCOUNTER — Encounter: Payer: Self-pay | Admitting: Radiology

## 2024-07-09 NOTE — Telephone Encounter (Signed)
 Inbound call from patient stating she would like updated prep instructions sent to her due to having to reschedule from 06/05/24 to 07/24/24 at 8 am. Please advise  Thank you

## 2024-07-09 NOTE — Telephone Encounter (Signed)
 New prep instructions sent via mychart. Hard copy will be mailed to address on file.

## 2024-07-18 ENCOUNTER — Encounter: Payer: Self-pay | Admitting: Gastroenterology

## 2024-07-20 ENCOUNTER — Ambulatory Visit
Admission: RE | Admit: 2024-07-20 | Discharge: 2024-07-20 | Disposition: A | Discharge status: Home / Self Care | Source: Ambulatory Visit | Attending: Internal Medicine | Admitting: Internal Medicine

## 2024-07-20 DIAGNOSIS — Z1231 Encounter for screening mammogram for malignant neoplasm of breast: Secondary | ICD-10-CM | POA: Diagnosis not present

## 2024-07-24 ENCOUNTER — Encounter: Payer: Self-pay | Admitting: Gastroenterology

## 2024-07-24 ENCOUNTER — Encounter: Admitting: Gastroenterology

## 2024-07-24 ENCOUNTER — Ambulatory Visit: Admitting: Gastroenterology

## 2024-07-24 VITALS — BP 138/72 | HR 71 | Temp 97.3°F | Resp 13 | Ht 65.0 in | Wt 177.8 lb

## 2024-07-24 DIAGNOSIS — Z1211 Encounter for screening for malignant neoplasm of colon: Secondary | ICD-10-CM | POA: Diagnosis not present

## 2024-07-24 DIAGNOSIS — K644 Residual hemorrhoidal skin tags: Secondary | ICD-10-CM | POA: Diagnosis not present

## 2024-07-24 DIAGNOSIS — D120 Benign neoplasm of cecum: Secondary | ICD-10-CM | POA: Diagnosis not present

## 2024-07-24 DIAGNOSIS — D122 Benign neoplasm of ascending colon: Secondary | ICD-10-CM | POA: Diagnosis not present

## 2024-07-24 DIAGNOSIS — K648 Other hemorrhoids: Secondary | ICD-10-CM

## 2024-07-24 DIAGNOSIS — Z860100 Personal history of colon polyps, unspecified: Secondary | ICD-10-CM

## 2024-07-24 DIAGNOSIS — Z860101 Personal history of adenomatous and serrated colon polyps: Secondary | ICD-10-CM

## 2024-07-24 MED ORDER — SODIUM CHLORIDE 0.9 % IV SOLN: 500.0000 mL | Freq: Once | INTRAVENOUS | Status: DC

## 2024-07-24 NOTE — Progress Notes (Unsigned)
 Vss nad trans to pacu

## 2024-07-24 NOTE — Op Note (Signed)
 [facility] Patient Name: Michele Monroe Procedure Date: 07/24/2024 9:34 AM MRN: [id number] Endoscopist: Gustav ALONSO Mcgee , MD, [id number] Age: 78 Referring MD:  Date of Birth: 1946-01-30 Gender: Female Account #: [id number] Procedure:                Colonoscopy Indications:              Screening in patient at increased risk: Family                            history of 1st-degree relative with colorectal                            cancer before age 62 years, High risk colon cancer                            surveillance: Personal history of adenoma (10 mm or                            greater in size) Medicines:                Monitored Anesthesia Care Procedure:                Pre-Anesthesia Assessment:                           - Prior to the procedure, a History and Physical                            was performed, and patient medications and                            allergies were reviewed. The patient's tolerance of                            previous anesthesia was also reviewed. The risks                            and benefits of the procedure and the sedation                            options and risks were discussed with the patient.                            All questions were answered, and informed consent                            was obtained. Prior Anticoagulants: The patient has                            taken no anticoagulant or antiplatelet agents. ASA                            Grade Assessment: II - A patient with mild systemic  disease. After reviewing the risks and benefits,                            the patient was deemed in satisfactory condition to                            undergo the procedure.                           After obtaining informed consent, the colonoscope                            was passed under direct vision. Throughout the                            procedure, the patient's blood pressure,  pulse, and                            oxygen saturations were monitored continuously. The                            Olympus Scope [id number] was introduced through the                            anus and advanced to the the cecum, identified by                            appendiceal orifice and ileocecal valve. The                            colonoscopy was performed without difficulty. The                            patient tolerated the procedure well. The quality                            of the bowel preparation was good. The ileocecal                            valve, appendiceal orifice, and rectum were                            photographed. Scope In: 9:37:18 AM Scope Out: 9:58:15 AM Scope Withdrawal Time: 0 hours 14 minutes 18 seconds  Total Procedure Duration: 0 hours 20 minutes 57 seconds  Findings:                 The perianal and digital rectal examinations were                            normal.                           Two sessile polyps were found in the ascending  colon and cecum. The polyps were 7 to 10 mm in                            size. These polyps were removed with a cold snare.                            Resection and retrieval were complete.                           Non-bleeding external and internal hemorrhoids were                            found during retroflexion. The hemorrhoids were                            large. Complications:            No immediate complications. Estimated Blood Loss:     Estimated blood loss was minimal. Impression:               - Two 7 to 10 mm polyps in the ascending colon and                            in the cecum, removed with a cold snare. Resected                            and retrieved.                           - Non-bleeding external and internal hemorrhoids. Recommendation:           - Resume previous diet.                           - Continue present medications.                            - Await pathology results.                           - No repeat colonoscopy due to age. Michele Monroe V. Michele Parlier, MD 07/24/2024 10:03:44 AM This report has been signed electronically.

## 2024-07-24 NOTE — Progress Notes (Unsigned)
 Pt's states no medical or surgical changes since previsit or office visit.

## 2024-07-24 NOTE — Progress Notes (Signed)
 Called to room to assist during endoscopic procedure.  Patient ID and intended procedure confirmed with present staff. Received instructions for my participation in the procedure from the performing physician.

## 2024-07-24 NOTE — Patient Instructions (Signed)
 Discharge instructions given. Handouts on polyps and Hemorrhoids. Resume previous medications. YOU HAD AN ENDOSCOPIC PROCEDURE TODAY AT THE [facility]:   Refer to the procedure report that was given to you for any specific questions about what was found during the examination.  If the procedure report does not answer your questions, please call your gastroenterologist to clarify.  If you requested that your care partner not be given the details of your procedure findings, then the procedure report has been included in a sealed envelope for you to review at your convenience later.  YOU SHOULD EXPECT: Some feelings of bloating in the abdomen. Passage of more gas than usual.  Walking can help get rid of the air that was put into your GI tract during the procedure and reduce the bloating. If you had a lower endoscopy (such as a colonoscopy or flexible sigmoidoscopy) you may notice spotting of blood in your stool or on the toilet paper. If you underwent a bowel prep for your procedure, you may not have a normal bowel movement for a few days.  Please Note:  You might notice some irritation and congestion in your nose or some drainage.  This is from the oxygen used during your procedure.  There is no need for concern and it should clear up in a day or so.  SYMPTOMS TO REPORT IMMEDIATELY:  Following lower endoscopy (colonoscopy or flexible sigmoidoscopy):  Excessive amounts of blood in the stool  Significant tenderness or worsening of abdominal pains  Swelling of the abdomen that is new, acute  Fever of 100F or higher  For urgent or emergent issues, a gastroenterologist can be reached at any hour by calling [phone]. Do not use MyChart messaging for urgent concerns.    DIET:  We do recommend a small meal at first, but then you may proceed to your regular diet.  Drink plenty of fluids but you should avoid alcoholic beverages for 24 hours.  ACTIVITY:  You should plan to take it easy  for the rest of today and you should NOT DRIVE or use heavy machinery until tomorrow (because of the sedation medicines used during the test).    FOLLOW UP: Our staff will call the number listed on your records the next business day following your procedure.  We will call around 7:15- 8:00 am to check on you and address any questions or concerns that you may have regarding the information given to you following your procedure. If we do not reach you, we will leave a message.     If any biopsies were taken you will be contacted by phone or by letter within the next 1-3 weeks.  Please call us at [phone] if you have not heard about the biopsies in 3 weeks.    SIGNATURES/CONFIDENTIALITY: You and/or your care partner have signed paperwork which will be entered into your electronic medical record.  These signatures attest to the fact that that the information above on your After Visit Summary has been reviewed and is understood.  Full responsibility of the confidentiality of this discharge information lies with you and/or your care-partner.

## 2024-07-24 NOTE — Progress Notes (Unsigned)
 [facility] History and Physical   Primary Care Physician:  Charlett Apolinar POUR, MD   Reason for Procedure:  History of adenomatous colon polyps  Plan:    Surveillance colonoscopy with possible interventions as needed     HPI: Michele Monroe is a very pleasant 78 y.o. female here for surveillance colonoscopy. Denies any nausea, vomiting, abdominal pain, melena or bright red blood per rectum  The risks and benefits as well as alternatives of endoscopic procedure(s) have been discussed and reviewed.  The patient was provided an opportunity to ask questions and all were answered. The patient agreed with the plan and demonstrated an understanding of the instructions.   Past Medical History:  Diagnosis Date   Adenomatous colon polyp    ALLERGIC RHINITIS 06/30/2010   perennial   Allergy    seasonal allergies   Anticoagulant long-term use 05/12/2022   For rle prox dvt end feb 2023     Anxiety    hx of   Arthritis    bilateral hands (R>L)   Cataract    Coughing up blood 08/13/2012   piece of tissue? will send to path poss from upper airway sinusitis  Final path was abscess    Depression    hx of   GERD 06/30/2010   on meds   Headache(784.0) 06/30/2010   migraines   Hepatic steatosis    HYPERLIPIDEMIA 06/30/2010   on meds   HYPERTENSION 06/30/2010   on meds   IBS 06/30/2010   Internal hemorrhoids    Renal lesion    Varicose veins    VITAMIN D  DEFICIENCY 06/30/2010    Past Surgical History:  Procedure Laterality Date   BREAST CYST ASPIRATION     BREAST SURGERY     needle bx left breast   COLONOSCOPY  2016   KN-prep good-TA   feet     hammer toe sx   POLYPECTOMY  2016   TA   ROTATOR CUFF REPAIR Right    varicose veins      Prior to Admission medications   Medication Sig Start Date End Date Taking? Authorizing Provider  atenolol  (TENORMIN ) 25 MG tablet TAKE 1 TABLET BY MOUTH EVERY DAY 07/09/24  Yes Panosh, Wanda K, MD  atorvastatin  (LIPITOR) 20 MG  tablet Take 1 tablet (20 mg total) by mouth daily. 01/27/24  Yes Panosh, Wanda K, MD  furosemide  (LASIX ) 20 MG tablet Take 1 tablet (20 mg total) by mouth daily. 01/27/24  Yes Panosh, Wanda K, MD  omeprazole  (PRILOSEC) 20 MG capsule TAKE 1 CAPSULE TWICE DAILY BEFORE MEALS 04/05/24  Yes Panosh, Wanda K, MD  rizatriptan  (MAXALT ) 10 MG tablet TAKE 1 TABLET (10 MG TOTAL) BY MOUTH AS NEEDED FOR MIGRAINE. MAY REPEAT IN 2 HOURS IF NEEDED.  MAXIMUM 2 TABLETS IN 24 HOURS 03/27/24  Yes Jaffe, Adam R, DO  tiZANidine  (ZANAFLEX ) 4 MG tablet TAKE 1/2 TO 1 TABLET BY MOUTH AT BEDTIME AS NEEDED FOR NECK PAIN. 03/27/24  Yes Jaffe, Adam R, DO  fluticasone  (FLONASE ) 50 MCG/ACT nasal spray Place 1 spray into both nostrils daily. 04/13/23   Hunsucker, Donnice SAUNDERS, MD  fluticasone -salmeterol (ADVAIR DISKUS) 500-50 MCG/ACT AEPB Inhale 1 puff into the lungs in the morning and at bedtime. 07/18/23   Hunsucker, Donnice SAUNDERS, MD  hyoscyamine  (LEVSIN  SL) 0.125 MG SL tablet DISSOLVE 1 TABLET UNDER THE TONGUE EVERY MORNING AS NEEDED 06/18/24   Natalina Wieting V, MD  ipratropium-albuterol  (DUONEB) 0.5-2.5 (3) MG/3ML SOLN Take 3 mLs by nebulization every  6 (six) hours as needed (Shortness of breath/wheezing). Patient not taking: Reported on 05/23/2024 01/29/23 03/27/24  [place], Camellia PARAS, DO  PATADAY 0.1 % ophthalmic solution Place 1 drop into both eyes 2 (two) times daily as needed for allergies.    [provider]  triamcinolone  cream (KENALOG ) 0.1 % Apply 1 Application topically. As needed 04/11/24   [provider]    Current Outpatient Medications  Medication Sig Dispense Refill   atenolol  (TENORMIN ) 25 MG tablet TAKE 1 TABLET BY MOUTH EVERY DAY 90 tablet 2   atorvastatin  (LIPITOR) 20 MG tablet Take 1 tablet (20 mg total) by mouth daily. 90 tablet 1   furosemide  (LASIX ) 20 MG tablet Take 1 tablet (20 mg total) by mouth daily. 90 tablet 1   omeprazole  (PRILOSEC) 20 MG capsule TAKE 1 CAPSULE TWICE DAILY BEFORE MEALS 180  capsule 3   rizatriptan  (MAXALT ) 10 MG tablet TAKE 1 TABLET (10 MG TOTAL) BY MOUTH AS NEEDED FOR MIGRAINE. MAY REPEAT IN 2 HOURS IF NEEDED.  MAXIMUM 2 TABLETS IN 24 HOURS 36 tablet 3   tiZANidine  (ZANAFLEX ) 4 MG tablet TAKE 1/2 TO 1 TABLET BY MOUTH AT BEDTIME AS NEEDED FOR NECK PAIN. 90 tablet 3   fluticasone  (FLONASE ) 50 MCG/ACT nasal spray Place 1 spray into both nostrils daily. 16 mL 2   fluticasone -salmeterol (ADVAIR DISKUS) 500-50 MCG/ACT AEPB Inhale 1 puff into the lungs in the morning and at bedtime. 60 each 3   hyoscyamine  (LEVSIN  SL) 0.125 MG SL tablet DISSOLVE 1 TABLET UNDER THE TONGUE EVERY MORNING AS NEEDED 90 tablet 3   ipratropium-albuterol  (DUONEB) 0.5-2.5 (3) MG/3ML SOLN Take 3 mLs by nebulization every 6 (six) hours as needed (Shortness of breath/wheezing). (Patient not taking: Reported on 05/23/2024) 360 mL 0   PATADAY 0.1 % ophthalmic solution Place 1 drop into both eyes 2 (two) times daily as needed for allergies.     triamcinolone  cream (KENALOG ) 0.1 % Apply 1 Application topically. As needed     Current Facility-Administered Medications  Medication Dose Route Frequency Provider Last Rate Last Admin   0.9 %  sodium chloride  infusion  500 mL Intravenous Once Endre Coutts V, MD        Allergies as of 07/24/2024   (No Known Allergies)    Family History  Problem Relation Age of Onset   Colon cancer Mother        age 23   Colon polyps Mother    Breast cancer Cousin    Arthritis Brother    Other Brother    Colon polyps Brother    Esophageal cancer Neg Hx    Stomach cancer Neg Hx    Rectal cancer Neg Hx     Social History   Socioeconomic History   Marital status: Widowed    Spouse name: Not on file   Number of children: 2   Years of education: Not on file   Highest education level: 12th grade  Occupational History   Occupation: retired    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       Lives in condo      3 cups of caffinated coffee per day. Denies soda/tea   Right handed   Social Drivers of Health   Financial Resource Strain: Medium Risk (01/26/2023)   Overall Financial Resource Strain (CARDIA)    Difficulty of Paying Living Expenses: Somewhat hard  Food Insecurity: Food Insecurity Present (01/28/2023)   Hunger Vital Sign    Worried About Running Out of Food in the Last Year: Sometimes true    Ran Out of Food in the Last Year: Sometimes true  Transportation Needs: No Transportation Needs (01/28/2023)   PRAPARE - Administrator, Civil Service (Medical): No    Lack of Transportation (Non-Medical): No  Physical Activity: Insufficiently Active (01/26/2023)   Exercise Vital Sign    Days of Exercise per Week: 5 days    Minutes of Exercise per Session: 10 min  Stress: Stress Concern Present (01/26/2023)   [employer] of Occupational Health - Occupational Stress Questionnaire    Feeling of Stress : To some extent  Social Connections: Unknown (01/26/2023)   Social Connection and Isolation Panel    Frequency of Communication with Friends and Family: More than three times a week    Frequency of Social Gatherings with Friends and Family: More than three times a week    Attends Religious Services: Patient declined    Database Administrator or Organizations: No    Attends Banker Meetings: Not on file    Marital Status: Widowed  Recent Concern: Social  Connections - Socially Isolated (01/14/2023)   Social Connection and Isolation Panel    Frequency of Communication with Friends and Family: More than three times a week    Frequency of Social Gatherings with Friends and Family: More than three times a week    Attends Religious Services: Never    Database Administrator or Organizations: No    Attends Banker Meetings: Never    Marital Status: Widowed  Intimate Partner Violence: Not At Risk (01/28/2023)   Humiliation, Afraid, Rape, and Kick questionnaire    Fear of Current or Ex-Partner: No    Emotionally Abused: No    Physically Abused: No    Sexually Abused: No    Review of Systems:  All other review of systems negative except as mentioned in the HPI.  Physical Exam: Vital signs in last 24 hours: BP 129/87   Pulse 80   Temp (!) 97.3 F (36.3 C) (Temporal)   Ht 5' 5 (1.651 m)   Wt 177 lb 12.8 oz (80.6 kg)   SpO2 95%   BMI 29.59 kg/m  General:   Alert, NAD Lungs:  Clear .   Heart:  Regular rate and rhythm Abdomen:  Soft, nontender and nondistended. Neuro/Psych:  Alert and cooperative. Normal mood and affect. A and O x 3  Reviewed labs, radiology imaging, old records and pertinent past GI work up  Patient is appropriate for planned procedure(s) and anesthesia in an ambulatory setting   K. Veena Cassidi Modesitt , MD [phone]

## 2024-07-25 ENCOUNTER — Telehealth: Payer: Self-pay

## 2024-07-25 NOTE — Telephone Encounter (Signed)
  Follow up Call-     07/24/2024    8:16 AM  Call back number  Post procedure Call Back phone  # [phone]  Permission to leave phone message Yes     Patient questions:  Do you have a fever, pain , or abdominal swelling? No. Pain Score  0 *  Have you tolerated food without any problems? Yes.    Have you been able to return to your normal activities? Yes.    Do you have any questions about your discharge instructions: Diet   No. Medications  No. Follow up visit  No.  Do you have questions or concerns about your Care? No.  Actions: * If pain score is 4 or above: No action needed, pain <4.

## 2024-07-26 LAB — SURGICAL PATHOLOGY

## 2024-07-27 NOTE — Progress Notes (Signed)
   07/27/2024  Patient ID: Michele Monroe, female   DOB: 03/10/1946, 78 y.o.   MRN: [id number]  Pharmacy Quality Measure Review  This patient is appearing on a report for being at risk of failing the adherence measure for cholesterol (statin) medications this calendar year.   Medication: Atorvastatin  Last fill date: 07/25/24 for 90 day supply  Insurance report was not up to date. No action needed at this time.   Jon VEAR Lindau, PharmD Clinical Pharmacist [phone]

## 2024-08-21 ENCOUNTER — Telehealth: Payer: Self-pay

## 2024-08-21 NOTE — Telephone Encounter (Signed)
 Copied from CRM [id number]. Topic: Clinical - Medical Advice >> Aug 20, 2024  2:12 PM Michele Monroe wrote: Reason for CRM: Patient wants to let provider know that she no longer need colonoscopy. Her results came back negative.

## 2024-08-27 ENCOUNTER — Ambulatory Visit: Payer: Self-pay | Admitting: Gastroenterology

## 2024-09-13 ENCOUNTER — Other Ambulatory Visit: Payer: Self-pay

## 2024-09-13 DIAGNOSIS — R609 Edema, unspecified: Secondary | ICD-10-CM

## 2024-09-13 NOTE — Telephone Encounter (Signed)
 [facility] sent to [facility]. Rx pended.   Contacted pt to confirm. Left a voicemail to call us  back.

## 2024-09-14 ENCOUNTER — Telehealth: Payer: Self-pay

## 2024-09-14 NOTE — Telephone Encounter (Signed)
 Copied from CRM [id number]. Topic: General - Other >> Sep 13, 2024  5:19 PM China J wrote: Reason for CRM: Patient is returning a call from [place]. I let her know that new scripts have been sent off for her. She would like to tell Dr. Charlett that she could not find the correct ordering provider for the medications she was needed so they had all the medications sent to her.

## 2024-09-17 MED ORDER — OMEPRAZOLE 20 MG PO CPDR: DELAYED_RELEASE_CAPSULE | ORAL | 3 refills | Status: AC

## 2024-09-17 MED ORDER — FUROSEMIDE 20 MG PO TABS: 20.0000 mg | ORAL_TABLET | Freq: Every day | ORAL | 1 refills | Status: AC

## 2024-09-17 MED ORDER — ATORVASTATIN CALCIUM 20 MG PO TABS: 20.0000 mg | ORAL_TABLET | Freq: Every day | ORAL | 1 refills | Status: AC

## 2024-09-17 MED ORDER — ATENOLOL 25 MG PO TABS: 25.0000 mg | ORAL_TABLET | Freq: Every day | ORAL | 2 refills | Status: AC

## 2024-09-17 NOTE — Telephone Encounter (Signed)
 Reach out to pt. Inform pt we received a message from her [facility] mailing order. Pt confirm that is her gaffer. Rx sent. Pt is aware.

## 2024-09-17 NOTE — Telephone Encounter (Signed)
 Spoke to pt. Pt confirm on the mailing order. Pt is aware Rx will be sent. No further action is needed.

## 2024-09-19 ENCOUNTER — Telehealth: Payer: Self-pay

## 2024-09-19 NOTE — Telephone Encounter (Signed)
 Reach out to pt regarding to [facility] requesting a renew on Rizatriptan  and tizandine . Inform pt to reach out to her Neurology Dr. Skeet as Dr. Charlett is not the prescriber. Pt verbalized understanding and states she will contact the neurology's office.

## 2024-09-20 ENCOUNTER — Telehealth: Payer: Self-pay

## 2024-09-20 MED ORDER — RIZATRIPTAN BENZOATE 10 MG PO TABS: ORAL_TABLET | ORAL | 3 refills | Status: AC

## 2024-09-20 MED ORDER — TIZANIDINE HCL 4 MG PO TABS: ORAL_TABLET | ORAL | 3 refills | Status: AC

## 2024-09-20 NOTE — Telephone Encounter (Signed)
 [facility]ill [facility] received [facility]

## 2025-03-26 ENCOUNTER — Ambulatory Visit: Admitting: Neurology
